# Patient Record
Sex: Male | Born: 1935
Health system: Southern US, Community
[De-identification: ages and names within clinical notes are randomized; demographics above are authoritative.]

## PROBLEM LIST (undated history)

## (undated) DIAGNOSIS — E785 Hyperlipidemia, unspecified: Secondary | ICD-10-CM

## (undated) DIAGNOSIS — N179 Acute kidney failure, unspecified: Secondary | ICD-10-CM

## (undated) DIAGNOSIS — R350 Frequency of micturition: Secondary | ICD-10-CM

## (undated) DIAGNOSIS — R55 Syncope and collapse: Secondary | ICD-10-CM

## (undated) DIAGNOSIS — R011 Cardiac murmur, unspecified: Secondary | ICD-10-CM

## (undated) DIAGNOSIS — K5792 Diverticulitis of intestine, part unspecified, without perforation or abscess without bleeding: Secondary | ICD-10-CM

## (undated) DIAGNOSIS — Z45018 Encounter for adjustment and management of other part of cardiac pacemaker: Secondary | ICD-10-CM

## (undated) DIAGNOSIS — C61 Malignant neoplasm of prostate: Secondary | ICD-10-CM

## (undated) DIAGNOSIS — G479 Sleep disorder, unspecified: Secondary | ICD-10-CM

## (undated) DIAGNOSIS — I951 Orthostatic hypotension: Secondary | ICD-10-CM

## (undated) DIAGNOSIS — I4892 Unspecified atrial flutter: Secondary | ICD-10-CM

## (undated) DIAGNOSIS — Z933 Colostomy status: Secondary | ICD-10-CM

## (undated) DIAGNOSIS — R351 Nocturia: Secondary | ICD-10-CM

## (undated) DIAGNOSIS — Z950 Presence of cardiac pacemaker: Secondary | ICD-10-CM

## (undated) DIAGNOSIS — I10 Essential (primary) hypertension: Secondary | ICD-10-CM

## (undated) DIAGNOSIS — I495 Sick sinus syndrome: Secondary | ICD-10-CM

## (undated) DIAGNOSIS — E079 Disorder of thyroid, unspecified: Secondary | ICD-10-CM

## (undated) HISTORY — DX: Orthostatic hypotension: I95.1

## (undated) HISTORY — PX: TONSILLECTOMY: SUR1361

## (undated) HISTORY — DX: Acute kidney failure, unspecified: N17.9

## (undated) HISTORY — DX: Syncope and collapse: R55

## (undated) HISTORY — DX: Encounter for adjustment and management of other part of cardiac pacemaker: Z45.018

## (undated) HISTORY — DX: Unspecified atrial flutter: I48.92

## (undated) HISTORY — DX: Diverticulitis of intestine, part unspecified, without perforation or abscess without bleeding: K57.92

## (undated) HISTORY — PX: RADIOACTIVE SEED IMPLANT: SHX5150

## (undated) HISTORY — DX: Cardiac murmur, unspecified: R01.1

---
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
## 2007-07-12 ENCOUNTER — Ambulatory Visit: Admission: RE | Admit: 2007-07-12 | Discharge: 2007-10-10 | Payer: Self-pay | Admitting: Radiation Oncology

## 2007-10-01 ENCOUNTER — Encounter: Admission: RE | Admit: 2007-10-01 | Discharge: 2007-10-01 | Payer: Self-pay | Admitting: Urology

## 2007-10-08 ENCOUNTER — Ambulatory Visit (HOSPITAL_BASED_OUTPATIENT_CLINIC_OR_DEPARTMENT_OTHER): Admission: RE | Admit: 2007-10-08 | Discharge: 2007-10-08 | Payer: Self-pay | Admitting: Urology

## 2007-10-29 ENCOUNTER — Ambulatory Visit: Admission: RE | Admit: 2007-10-29 | Discharge: 2008-01-25 | Payer: Self-pay | Admitting: Radiation Oncology

## 2008-12-25 ENCOUNTER — Ambulatory Visit (HOSPITAL_COMMUNITY): Admission: RE | Admit: 2008-12-25 | Discharge: 2008-12-25 | Payer: Self-pay | Admitting: Urology

## 2009-09-28 ENCOUNTER — Ambulatory Visit (HOSPITAL_COMMUNITY): Admission: RE | Admit: 2009-09-28 | Discharge: 2009-09-28 | Payer: Self-pay | Admitting: Urology

## 2010-05-27 ENCOUNTER — Encounter: Payer: Self-pay | Admitting: Urology

## 2010-09-13 ENCOUNTER — Other Ambulatory Visit (HOSPITAL_COMMUNITY): Payer: Self-pay | Admitting: Urology

## 2010-09-13 DIAGNOSIS — C61 Malignant neoplasm of prostate: Secondary | ICD-10-CM

## 2010-09-17 NOTE — Op Note (Signed)
[name] [name], [name] [name]                 ACCOUNT NO.:  [id number]   MEDICAL RECORD NO.:  [id number]          PATIENT TYPE:  AMB   LOCATION:  [facility]                         FACILITY:  [facility]   PHYSICIAN:  Hanley Ben, M.D.  DATE OF BIRTH:  [birth year]   DATE OF PROCEDURE:  10/08/2007  DATE OF DISCHARGE:                               OPERATIVE REPORT   PREOPERATIVE DIAGNOSIS:  Adenocarcinoma of prostate.   POSTOPERATIVE DIAGNOSIS:  Adenocarcinoma of prostate.   PROCEDURE:  I-125 seed implantation.   SURGEONS:  Arvil Persons, M.D., and Sheral Apley. Tammi Klippel, M.D.   ANESTHESIA:  General.   INDICATIONS:  The patient is 75 year old male who had a positive  prostate biopsy for adenocarcinoma of the prostate, Gleason score 6 and  7.  PSA was 8.3.  Treatment options were discussed with the patient and  he chose to have radiation treatment.  He was advised by Dr. Tammi Klippel to  have combination  external beam and brachytherapy.  He had already  received IMRT and he is now scheduled for I-125 seed implantation.   Under general anesthesia the patient was prepped and draped and placed  in the dorsal lithotomy position.  A Foley catheter was inserted in the  bladder.  A rectal tube was inserted in the rectum.  Ultrasound planning  was then done by Dr. Tammi Klippel.  When the ultrasound planning was  completed, using the Nucletron and under ultrasound guidance 24 needles  were used to insert 83 seeds in the prostate.  The total activity is  32.0380 mCi.  Then under fluoroscopy there appears to be good seed  distribution.   The Foley catheter that was inserted in the bladder was then removed.  A  flexible cystoscope was then passed in the bladder.  The anterior  urethra is normal.  There is moderate prostatic hypertrophy.  There are  some blood clots in the bladder.  The cystoscope was then removed.  A  #24 Foley catheter was then inserted in the bladder and the bladder was  irrigated with normal saline and  several small blood clots were  irrigated out.  The cystoscope was then reinserted in the bladder.  There is no free-floating seed in the bladder.  There is no stone or  tumor in the bladder.  The ureteral orifices are in normal position and  shape.  There appears to be one seed at the bladder neck; however, it  is under the mucosa and therefore it was not extracted.  The cystoscope  was then removed.  A #16 Foley catheter was then inserted in the  bladder.   The patient tolerated the procedure well and left the OR in satisfactory  condition to post anesthesia care unit.      Hanley Ben, M.D.  Electronically Signed     MN/MEDQ  D:  10/08/2007  T:  10/08/2007  Job:  [id number]   cc:   Sheral Apley Tammi Klippel, M.D.  Fax: [phone]

## 2010-09-25 ENCOUNTER — Encounter (HOSPITAL_COMMUNITY): Payer: Self-pay

## 2010-09-25 ENCOUNTER — Encounter (HOSPITAL_COMMUNITY): Payer: Medicare Other

## 2010-09-25 ENCOUNTER — Encounter (HOSPITAL_COMMUNITY)
Admission: RE | Admit: 2010-09-25 | Discharge: 2010-09-25 | Disposition: A | Discharge status: Home / Self Care | Payer: Medicare Other | Source: Ambulatory Visit | Attending: Urology | Admitting: Urology

## 2010-09-25 DIAGNOSIS — C61 Malignant neoplasm of prostate: Secondary | ICD-10-CM

## 2010-09-25 HISTORY — DX: Malignant neoplasm of prostate: C61

## 2010-09-25 MED ORDER — TECHNETIUM TC 99M MEDRONATE IV KIT
23.9000 | PACK | Freq: Once | INTRAVENOUS | Status: AC | PRN
  Administered 2010-09-25: 23.9 via INTRAVENOUS

## 2010-11-08 ENCOUNTER — Other Ambulatory Visit (HOSPITAL_COMMUNITY): Payer: Self-pay | Admitting: Urology

## 2010-11-08 DIAGNOSIS — R935 Abnormal findings on diagnostic imaging of other abdominal regions, including retroperitoneum: Secondary | ICD-10-CM

## 2010-11-11 ENCOUNTER — Ambulatory Visit (HOSPITAL_COMMUNITY): Payer: Medicare Other

## 2010-11-11 ENCOUNTER — Ambulatory Visit (HOSPITAL_COMMUNITY)
Admission: RE | Admit: 2010-11-11 | Discharge: 2010-11-11 | Disposition: A | Discharge status: Home / Self Care | Payer: Medicare Other | Source: Ambulatory Visit | Attending: Urology | Admitting: Urology

## 2010-11-11 DIAGNOSIS — R935 Abnormal findings on diagnostic imaging of other abdominal regions, including retroperitoneum: Secondary | ICD-10-CM

## 2010-11-13 ENCOUNTER — Ambulatory Visit (HOSPITAL_COMMUNITY): Admission: RE | Admit: 2010-11-13 | Payer: Medicare Other | Source: Ambulatory Visit

## 2010-11-13 ENCOUNTER — Ambulatory Visit (HOSPITAL_COMMUNITY)
Admission: RE | Admit: 2010-11-13 | Discharge: 2010-11-13 | Disposition: A | Discharge status: Home / Self Care | Payer: Medicare Other | Source: Ambulatory Visit | Attending: Urology | Admitting: Urology

## 2010-11-13 ENCOUNTER — Other Ambulatory Visit: Payer: Self-pay | Admitting: Interventional Radiology

## 2010-11-13 ENCOUNTER — Other Ambulatory Visit (HOSPITAL_COMMUNITY): Payer: Self-pay | Admitting: Urology

## 2010-11-13 DIAGNOSIS — R599 Enlarged lymph nodes, unspecified: Secondary | ICD-10-CM | POA: Insufficient documentation

## 2010-11-13 DIAGNOSIS — R935 Abnormal findings on diagnostic imaging of other abdominal regions, including retroperitoneum: Secondary | ICD-10-CM

## 2010-11-13 DIAGNOSIS — Z8546 Personal history of malignant neoplasm of prostate: Secondary | ICD-10-CM | POA: Insufficient documentation

## 2010-11-13 LAB — CBC
HCT: 37.1 % — ABNORMAL LOW (ref 39.0–52.0)
MCV: 90.7 fL (ref 78.0–100.0)
Platelets: 196 10*3/uL (ref 150–400)
RBC: 4.09 MIL/uL — ABNORMAL LOW (ref 4.22–5.81)
WBC: 5.4 10*3/uL (ref 4.0–10.5)

## 2010-11-13 LAB — PROTIME-INR: INR: 1.08 (ref 0.00–1.49)

## 2010-11-19 ENCOUNTER — Other Ambulatory Visit (HOSPITAL_COMMUNITY): Payer: Medicare Other

## 2011-01-29 LAB — CBC
Hemoglobin: 11.9 — ABNORMAL LOW
MCHC: 33.7
RBC: 3.77 — ABNORMAL LOW
RDW: 15
WBC: 4.5

## 2011-01-29 LAB — COMPREHENSIVE METABOLIC PANEL
AST: 18
Albumin: 3.7
Alkaline Phosphatase: 47
BUN: 24 — ABNORMAL HIGH
Chloride: 106
Glucose, Bld: 104 — ABNORMAL HIGH
Potassium: 4.9

## 2011-01-29 LAB — PROTIME-INR: Prothrombin Time: 14.3

## 2011-04-30 DIAGNOSIS — I10 Essential (primary) hypertension: Secondary | ICD-10-CM

## 2011-04-30 HISTORY — DX: Essential (primary) hypertension: I10

## 2011-05-08 DIAGNOSIS — R9389 Abnormal findings on diagnostic imaging of other specified body structures: Secondary | ICD-10-CM | POA: Diagnosis not present

## 2011-05-08 DIAGNOSIS — I10 Essential (primary) hypertension: Secondary | ICD-10-CM | POA: Diagnosis not present

## 2011-05-08 DIAGNOSIS — E785 Hyperlipidemia, unspecified: Secondary | ICD-10-CM | POA: Diagnosis not present

## 2011-05-14 DIAGNOSIS — I4892 Unspecified atrial flutter: Secondary | ICD-10-CM | POA: Diagnosis not present

## 2011-05-14 DIAGNOSIS — R079 Chest pain, unspecified: Secondary | ICD-10-CM | POA: Diagnosis not present

## 2011-05-16 DIAGNOSIS — R079 Chest pain, unspecified: Secondary | ICD-10-CM | POA: Diagnosis not present

## 2011-05-16 DIAGNOSIS — I4892 Unspecified atrial flutter: Secondary | ICD-10-CM | POA: Diagnosis not present

## 2011-05-26 DIAGNOSIS — I4892 Unspecified atrial flutter: Secondary | ICD-10-CM | POA: Diagnosis not present

## 2011-05-26 DIAGNOSIS — I10 Essential (primary) hypertension: Secondary | ICD-10-CM | POA: Diagnosis not present

## 2011-05-26 DIAGNOSIS — E7800 Pure hypercholesterolemia, unspecified: Secondary | ICD-10-CM | POA: Diagnosis not present

## 2011-05-26 DIAGNOSIS — R079 Chest pain, unspecified: Secondary | ICD-10-CM | POA: Diagnosis not present

## 2011-06-02 DIAGNOSIS — I4891 Unspecified atrial fibrillation: Secondary | ICD-10-CM | POA: Diagnosis not present

## 2011-06-02 DIAGNOSIS — F17200 Nicotine dependence, unspecified, uncomplicated: Secondary | ICD-10-CM | POA: Diagnosis not present

## 2011-06-02 DIAGNOSIS — I10 Essential (primary) hypertension: Secondary | ICD-10-CM | POA: Diagnosis not present

## 2011-06-02 DIAGNOSIS — Z131 Encounter for screening for diabetes mellitus: Secondary | ICD-10-CM | POA: Diagnosis not present

## 2011-06-05 DIAGNOSIS — E7800 Pure hypercholesterolemia, unspecified: Secondary | ICD-10-CM | POA: Diagnosis not present

## 2011-06-05 DIAGNOSIS — Z7901 Long term (current) use of anticoagulants: Secondary | ICD-10-CM | POA: Diagnosis not present

## 2011-06-05 DIAGNOSIS — I4892 Unspecified atrial flutter: Secondary | ICD-10-CM | POA: Diagnosis not present

## 2011-06-05 DIAGNOSIS — I10 Essential (primary) hypertension: Secondary | ICD-10-CM | POA: Diagnosis not present

## 2011-06-06 ENCOUNTER — Other Ambulatory Visit (HOSPITAL_COMMUNITY): Payer: Self-pay | Admitting: Cardiology

## 2011-06-06 DIAGNOSIS — I4892 Unspecified atrial flutter: Secondary | ICD-10-CM

## 2011-06-09 ENCOUNTER — Encounter (HOSPITAL_COMMUNITY): Payer: Self-pay | Admitting: Respiratory Therapy

## 2011-06-16 ENCOUNTER — Other Ambulatory Visit: Payer: Self-pay

## 2011-06-16 ENCOUNTER — Ambulatory Visit (HOSPITAL_COMMUNITY): Payer: Medicare Other | Admitting: Anesthesiology

## 2011-06-16 ENCOUNTER — Encounter (HOSPITAL_COMMUNITY)
Admission: RE | Disposition: A | Discharge status: Home / Self Care | Payer: Self-pay | Source: Ambulatory Visit | Attending: Cardiology

## 2011-06-16 ENCOUNTER — Encounter (HOSPITAL_COMMUNITY): Payer: Self-pay | Admitting: Anesthesiology

## 2011-06-16 ENCOUNTER — Ambulatory Visit (HOSPITAL_COMMUNITY)
Admission: RE | Admit: 2011-06-16 | Discharge: 2011-06-16 | Disposition: A | Discharge status: Home / Self Care | Payer: Medicare Other | Source: Ambulatory Visit | Attending: Cardiology | Admitting: Cardiology

## 2011-06-16 DIAGNOSIS — I10 Essential (primary) hypertension: Secondary | ICD-10-CM

## 2011-06-16 DIAGNOSIS — I4892 Unspecified atrial flutter: Secondary | ICD-10-CM | POA: Diagnosis not present

## 2011-06-16 DIAGNOSIS — E785 Hyperlipidemia, unspecified: Secondary | ICD-10-CM | POA: Insufficient documentation

## 2011-06-16 HISTORY — DX: Unspecified atrial flutter: I48.92

## 2011-06-16 HISTORY — PX: CARDIOVERSION: SHX1299

## 2011-06-16 LAB — BASIC METABOLIC PANEL
BUN: 14 mg/dL (ref 6–23)
Calcium: 8.8 mg/dL (ref 8.4–10.5)
Creatinine, Ser: 1.03 mg/dL (ref 0.50–1.35)
GFR calc Af Amer: 81 mL/min — ABNORMAL LOW (ref >90)

## 2011-06-16 LAB — CBC
HCT: 35.9 % — ABNORMAL LOW (ref 39.0–52.0)
MCH: 30.9 pg (ref 26.0–34.0)
MCHC: 33.7 g/dL (ref 30.0–36.0)
MCV: 91.8 fL (ref 78.0–100.0)
Platelets: 295 10*3/uL (ref 150–400)
RDW: 14 % (ref 11.5–15.5)

## 2011-06-16 LAB — PROTIME-INR: Prothrombin Time: 17.2 seconds — ABNORMAL HIGH (ref 11.6–15.2)

## 2011-06-16 SURGERY — CARDIOVERSION
Anesthesia: General | Wound class: Clean

## 2011-06-16 MED ORDER — HYDROCORTISONE 1 % EX CREA
1.0000 "application " | TOPICAL_CREAM | Freq: Three times a day (TID) | CUTANEOUS | Status: DC | PRN
  Filled 2011-06-16: qty 28

## 2011-06-16 MED ORDER — PROPOFOL 10 MG/ML IV EMUL
INTRAVENOUS | Status: DC | PRN
  Administered 2011-06-16: 100 mg via INTRAVENOUS

## 2011-06-16 MED ORDER — SODIUM CHLORIDE 0.9 % IJ SOLN: 3.0000 mL | Freq: Two times a day (BID) | INTRAMUSCULAR | Status: DC

## 2011-06-16 MED ORDER — LIDOCAINE HCL (CARDIAC) 20 MG/ML IV SOLN
INTRAVENOUS | Status: DC | PRN
  Administered 2011-06-16: 60 mg via INTRAVENOUS

## 2011-06-16 MED ORDER — SODIUM CHLORIDE 0.9 % IV SOLN
INTRAVENOUS | Status: DC | PRN
  Administered 2011-06-16: 09:00:00 via INTRAVENOUS

## 2011-06-16 MED ORDER — SODIUM CHLORIDE 0.9 % IV SOLN: 250.0000 mL | INTRAVENOUS | Status: DC

## 2011-06-16 MED ORDER — SODIUM CHLORIDE 0.9 % IJ SOLN: 3.0000 mL | INTRAMUSCULAR | Status: DC | PRN

## 2011-06-16 NOTE — Preoperative (Signed)
Beta Blockers   Reason not to administer Beta Blockers:Not Applicable 

## 2011-06-16 NOTE — H&P (Signed)
CC; Scheduled for elective DCCV for A. Flutter  HOPI: Patient presents for f/u of atrial flutter.   Initially presented for evaluation of abnormal EKG and chest pain.   Patient states that since last office visit he has felt significant improvement in overall well-being.   He has not had any chest pain.   He denies any shortness of breath.  Chest pain started 6-12 months  ago.   It is described as throbbing.     Also c/o syncope.   Symptoms started  12 months ago.   There has been 2 recurrent episodes.   last episode was 3 month ago.   Symptom onset was associated with lightheadedness and dizziness prior to the syncope.   He attributes his symptoms to Marijuana use which he has now quit.    ROS: Arthritis-no, life-style limiting-no;  Cramping of the legs and feet at night or with activity-no; Diabetes-no;  Hypothyroidism-h/o Hyperthyroidism 30 years ago;  Previous Stroke-no, Previous GI Bleed-no ,  Recent weight change-has gained 20 lbs.   in the last 3-4 months.   Other systems negative.     Known allergies: NKDA.   Physical Exam: Afebrile. HR 69/min, irreg, 160/75 mm Hg  GENERAL: Well built and normal body habitus,  appears younger than stated age, no acute distress.   Alert Ox3.   EYES: PERRLA, EOM's full, conjunctivae clear.   THROAT: Clear, no exudates, no lesions.   NECK: Supple, no masses, no thyromegaly, no JVD.     CHEST: Lungs clear, no rales, no rhonchi, no wheezes.     HEART: S1S2 normal,  no murmurs, no rubs, no gallops.     ABDOMEN: Soft, no tenderness, no masses, BS normal.     BACK: Normal curvature, no tenderness.   EXTREMITIES: Full range of motion, no deformities, no edema, no erythema.   NEURO: Alert, oriented x 3,  no localizing findings.     SKIN: Normal, no rashes, no lesions noted.   No ulcerations or signs of limb ischemia.   No xanthelasma or Xanthoma.     Vascular exam: Normal carotid upstroke.   No bruit.   Abdominal aortic palpation appears to be normal without  prominent pulsation.   No bruit.   Femoral and pedal pulses are normal.  Impression: 1. Atrial Flutter with controlled ventricular response.  ECG 06/16/11: A. Flutter with VR 75/min with 2:1 conduction. Non specific ST change. Normal QT. ECG 05/26/11: A.   Flutter with controlled ventricular response @ 55-60/min.   Variable AV conduction.    Exercise sestamibi.   05/16/11: Ex: 5 min, 5.3 METs.   EKG: A.   Flutter.   Normal myocaridal perfusion.   EF 79%.  Labs done on 07-2010 revealed CBC, CMP, TSH normal at 0.840.     2. Hypertension.  Controlled.  3. Hyperlipidemia.    4. Chest pain  atypical.   No further pain since last OV 04/30/11. Exercise sestamibi. 05/16/11: Ex: 5 min, 5.3 METs. EKG: A. Flutter. Normal myocaridal perfusion. EF 79% Echo 05/14/11: Normal LVEF, Mod Left, Mild Right Atrial enlargement. Mild RV dil. Mild Pul HTN. MildTR.  Plan: Mechanism of underlying disease process and action of medications discussed with the patient.   BP more stable since stopping Benicar HCT  from 80/58 mm Hg to today 138/74 mm Hg. I will continue Cardizem CD 180 mg by mouth twice a day, and  Flecainide 50 mg po BID. Patient presents  for direct-current cardioversion as an outpatient basis.  I have discussed regarding risks benefits rate control vs rhythm control with the patient. Patient understands cardiac arrest and need for CPR, aspiration pneumonia, but not limited to these. Patient is willing.

## 2011-06-16 NOTE — Anesthesia Preprocedure Evaluation (Addendum)
Anesthesia Evaluation   Patient awake    Reviewed: Allergy & Precautions, H&P , NPO status , Patient's Chart, lab work & pertinent test results  Airway Mallampati: I  Neck ROM: Full    Dental   Pulmonary  clear to auscultation        Cardiovascular hypertension, Pt. on medications + dysrhythmias Atrial Fibrillation Irregular Normal    Neuro/Psych    GI/Hepatic   Endo/Other    Renal/GU      Musculoskeletal   Abdominal   Peds  Hematology   Anesthesia Other Findings   Reproductive/Obstetrics                          Anesthesia Physical Anesthesia Plan  ASA: III  Anesthesia Plan: General   Post-op Pain Management:    Induction: Intravenous  Airway Management Planned: Mask  Additional Equipment:   Intra-op Plan:   Post-operative Plan:   Informed Consent: I have reviewed the patients History and Physical, chart, labs and discussed the procedure including the risks, benefits and alternatives for the proposed anesthesia with the patient or authorized representative who has indicated his/her understanding and acceptance.   Dental advisory given  Plan Discussed with: Anesthesiologist, Surgeon and CRNA  Anesthesia Plan Comments:        Anesthesia Quick Evaluation

## 2011-06-16 NOTE — Op Note (Signed)
Direct current cardioversion: Indication symptomatic A. Flutter.  Procedure: Using 100 mg of IV Propofol for achieving deep (Moderate sedation), synchronized direct current cardioversion performed. Patient was delivered with 50 Joules of electricity with success to NSR. Patient tolerated the procedure well. No immediate complication noted.

## 2011-06-16 NOTE — Transfer of Care (Signed)
Immediate Anesthesia Transfer of Care Note  Patient: [name] [name]  Procedure(s) Performed:  CARDIOVERSION  Patient Location: Short Stay  Anesthesia Type: General  Level of Consciousness: awake, alert  and oriented  Airway & Oxygen Therapy: Patient Spontanous Breathing and Patient connected to nasal cannula oxygen  Post-op Assessment: Report given to PACU RN and Post -op Vital signs reviewed and stable  Post vital signs: Reviewed and stable  Complications: No apparent anesthesia complications

## 2011-06-16 NOTE — Anesthesia Postprocedure Evaluation (Signed)
  Anesthesia Post-op Note  Patient: [name] [name]  Procedure(s) Performed:  CARDIOVERSION  Patient Location: Short Stay  Anesthesia Type: General  Level of Consciousness: awake, alert  and oriented  Airway and Oxygen Therapy: Patient Spontanous Breathing and Patient connected to nasal cannula oxygen  Post-op Pain: none  Post-op Assessment: Post-op Vital signs reviewed, Patient's Cardiovascular Status Stable, Respiratory Function Stable and No signs of Nausea or vomiting  Post-op Vital Signs: Reviewed and stable  Complications: No apparent anesthesia complications

## 2011-06-17 ENCOUNTER — Encounter (HOSPITAL_COMMUNITY): Payer: Self-pay | Admitting: Cardiology

## 2011-06-18 DIAGNOSIS — E7800 Pure hypercholesterolemia, unspecified: Secondary | ICD-10-CM | POA: Diagnosis not present

## 2011-06-18 DIAGNOSIS — I4892 Unspecified atrial flutter: Secondary | ICD-10-CM | POA: Diagnosis not present

## 2011-06-18 DIAGNOSIS — Z7901 Long term (current) use of anticoagulants: Secondary | ICD-10-CM | POA: Diagnosis not present

## 2011-06-18 DIAGNOSIS — I10 Essential (primary) hypertension: Secondary | ICD-10-CM | POA: Diagnosis not present

## 2011-06-27 DIAGNOSIS — C61 Malignant neoplasm of prostate: Secondary | ICD-10-CM | POA: Diagnosis not present

## 2011-07-03 DIAGNOSIS — C61 Malignant neoplasm of prostate: Secondary | ICD-10-CM | POA: Diagnosis not present

## 2011-09-01 DIAGNOSIS — E785 Hyperlipidemia, unspecified: Secondary | ICD-10-CM | POA: Diagnosis not present

## 2011-09-01 DIAGNOSIS — I4891 Unspecified atrial fibrillation: Secondary | ICD-10-CM | POA: Diagnosis not present

## 2011-09-01 DIAGNOSIS — C61 Malignant neoplasm of prostate: Secondary | ICD-10-CM | POA: Diagnosis not present

## 2011-09-01 DIAGNOSIS — I10 Essential (primary) hypertension: Secondary | ICD-10-CM | POA: Diagnosis not present

## 2011-11-03 DIAGNOSIS — Z950 Presence of cardiac pacemaker: Secondary | ICD-10-CM

## 2011-11-03 HISTORY — DX: Presence of cardiac pacemaker: Z95.0

## 2011-11-03 HISTORY — PX: CARDIAC ELECTROPHYSIOLOGY STUDY AND ABLATION: SHX1294

## 2011-11-20 ENCOUNTER — Emergency Department (HOSPITAL_COMMUNITY): Payer: Medicare Other

## 2011-11-20 ENCOUNTER — Encounter (HOSPITAL_COMMUNITY): Payer: Self-pay | Admitting: Emergency Medicine

## 2011-11-20 ENCOUNTER — Inpatient Hospital Stay (HOSPITAL_COMMUNITY)
Admission: EM | Admit: 2011-11-20 | Discharge: 2011-11-24 | DRG: 250 | Disposition: A | Discharge status: Home / Self Care | Payer: Medicare Other | Attending: Cardiology | Admitting: Cardiology

## 2011-11-20 DIAGNOSIS — Z87891 Personal history of nicotine dependence: Secondary | ICD-10-CM | POA: Diagnosis not present

## 2011-11-20 DIAGNOSIS — E785 Hyperlipidemia, unspecified: Secondary | ICD-10-CM | POA: Diagnosis present

## 2011-11-20 DIAGNOSIS — R0602 Shortness of breath: Secondary | ICD-10-CM | POA: Diagnosis not present

## 2011-11-20 DIAGNOSIS — I495 Sick sinus syndrome: Secondary | ICD-10-CM | POA: Diagnosis present

## 2011-11-20 DIAGNOSIS — I10 Essential (primary) hypertension: Secondary | ICD-10-CM | POA: Diagnosis present

## 2011-11-20 DIAGNOSIS — S2239XA Fracture of one rib, unspecified side, initial encounter for closed fracture: Secondary | ICD-10-CM | POA: Diagnosis not present

## 2011-11-20 DIAGNOSIS — R55 Syncope and collapse: Secondary | ICD-10-CM | POA: Diagnosis present

## 2011-11-20 DIAGNOSIS — Z79899 Other long term (current) drug therapy: Secondary | ICD-10-CM

## 2011-11-20 DIAGNOSIS — R7309 Other abnormal glucose: Secondary | ICD-10-CM | POA: Diagnosis present

## 2011-11-20 DIAGNOSIS — R42 Dizziness and giddiness: Secondary | ICD-10-CM | POA: Diagnosis not present

## 2011-11-20 DIAGNOSIS — I4892 Unspecified atrial flutter: Secondary | ICD-10-CM | POA: Diagnosis not present

## 2011-11-20 DIAGNOSIS — R61 Generalized hyperhidrosis: Secondary | ICD-10-CM | POA: Diagnosis not present

## 2011-11-20 DIAGNOSIS — R001 Bradycardia, unspecified: Secondary | ICD-10-CM

## 2011-11-20 DIAGNOSIS — I469 Cardiac arrest, cause unspecified: Secondary | ICD-10-CM | POA: Diagnosis present

## 2011-11-20 DIAGNOSIS — W1809XA Striking against other object with subsequent fall, initial encounter: Secondary | ICD-10-CM | POA: Diagnosis present

## 2011-11-20 DIAGNOSIS — C61 Malignant neoplasm of prostate: Secondary | ICD-10-CM | POA: Diagnosis present

## 2011-11-20 DIAGNOSIS — I498 Other specified cardiac arrhythmias: Secondary | ICD-10-CM | POA: Diagnosis not present

## 2011-11-20 DIAGNOSIS — M549 Dorsalgia, unspecified: Secondary | ICD-10-CM | POA: Diagnosis not present

## 2011-11-20 DIAGNOSIS — R404 Transient alteration of awareness: Secondary | ICD-10-CM | POA: Diagnosis not present

## 2011-11-20 DIAGNOSIS — R079 Chest pain, unspecified: Secondary | ICD-10-CM | POA: Diagnosis not present

## 2011-11-20 HISTORY — DX: Unspecified atrial flutter: I48.92

## 2011-11-20 HISTORY — DX: Essential (primary) hypertension: I10

## 2011-11-20 HISTORY — DX: Syncope and collapse: R55

## 2011-11-20 LAB — COMPREHENSIVE METABOLIC PANEL
ALT: 16 U/L (ref 0–53)
AST: 24 U/L (ref 0–37)
CO2: 24 mEq/L (ref 19–32)
Calcium: 9.4 mg/dL (ref 8.4–10.5)
Creatinine, Ser: 1.59 mg/dL — ABNORMAL HIGH (ref 0.50–1.35)
GFR calc Af Amer: 48 mL/min — ABNORMAL LOW (ref >90)
GFR calc non Af Amer: 41 mL/min — ABNORMAL LOW (ref >90)
Glucose, Bld: 172 mg/dL — ABNORMAL HIGH (ref 70–99)
Sodium: 145 mEq/L (ref 135–145)
Total Protein: 6.9 g/dL (ref 6.0–8.3)

## 2011-11-20 LAB — PROTIME-INR
INR: 1.09 (ref 0.00–1.49)
Prothrombin Time: 14.3 seconds (ref 11.6–15.2)

## 2011-11-20 LAB — CBC
MCH: 30.9 pg (ref 26.0–34.0)
MCHC: 34 g/dL (ref 30.0–36.0)
MCV: 91 fL (ref 78.0–100.0)
Platelets: 190 10*3/uL (ref 150–400)

## 2011-11-20 LAB — CK TOTAL AND CKMB (NOT AT ARMC): Relative Index: 1.9 (ref 0.0–2.5)

## 2011-11-20 LAB — TROPONIN I: Troponin I: <0.3 ng/mL (ref <0.30)

## 2011-11-20 MED ORDER — TAMSULOSIN HCL 0.4 MG PO CAPS
0.4000 mg | ORAL_CAPSULE | Freq: Two times a day (BID) | ORAL | Status: DC
  Administered 2011-11-20 – 2011-11-24 (×9): 0.4 mg via ORAL
  Filled 2011-11-20 (×10): qty 1

## 2011-11-20 MED ORDER — SIMVASTATIN 20 MG PO TABS
20.0000 mg | ORAL_TABLET | Freq: Every day | ORAL | Status: DC
  Administered 2011-11-20 – 2011-11-22 (×3): 20 mg via ORAL
  Filled 2011-11-20 (×5): qty 1

## 2011-11-20 MED ORDER — HYDROCODONE-ACETAMINOPHEN 5-325 MG PO TABS
1.0000 | ORAL_TABLET | ORAL | Status: DC | PRN
  Administered 2011-11-20: 2 via ORAL
  Administered 2011-11-20: 1 via ORAL
  Filled 2011-11-20: qty 2

## 2011-11-20 MED ORDER — SODIUM CHLORIDE 0.9 % IV SOLN
INTRAVENOUS | Status: AC
  Administered 2011-11-20: 75 mL/h via INTRAVENOUS

## 2011-11-20 MED ORDER — OLMESARTAN MEDOXOMIL-HCTZ 40-12.5 MG PO TABS: 1.0000 | ORAL_TABLET | Freq: Every day | ORAL | Status: DC

## 2011-11-20 MED ORDER — ASPIRIN EC 81 MG PO TBEC
81.0000 mg | DELAYED_RELEASE_TABLET | Freq: Every day | ORAL | Status: DC
  Administered 2011-11-20 – 2011-11-21 (×2): 81 mg via ORAL
  Filled 2011-11-20 (×2): qty 1

## 2011-11-20 MED ORDER — SODIUM CHLORIDE 0.9 % IV BOLUS (SEPSIS)
1000.0000 mL | Freq: Once | INTRAVENOUS | Status: AC
  Administered 2011-11-20: 1000 mL via INTRAVENOUS

## 2011-11-20 MED ORDER — ACETAMINOPHEN 650 MG RE SUPP: 650.0000 mg | Freq: Four times a day (QID) | RECTAL | Status: DC | PRN

## 2011-11-20 MED ORDER — ATROPINE SULFATE 1 MG/ML IJ SOLN: 0.4000 mg | Freq: Once | INTRAMUSCULAR | Status: DC

## 2011-11-20 MED ORDER — ACETAMINOPHEN 325 MG PO TABS: 650.0000 mg | ORAL_TABLET | Freq: Four times a day (QID) | ORAL | Status: DC | PRN

## 2011-11-20 MED ORDER — SODIUM CHLORIDE 0.9 % IJ SOLN: 3.0000 mL | INTRAMUSCULAR | Status: DC | PRN

## 2011-11-20 MED ORDER — ONDANSETRON HCL 4 MG/2ML IJ SOLN
4.0000 mg | Freq: Once | INTRAMUSCULAR | Status: AC
  Administered 2011-11-20: 4 mg via INTRAVENOUS
  Filled 2011-11-20: qty 2

## 2011-11-20 MED ORDER — ATROPINE SULFATE 1 MG/ML IJ SOLN
0.5000 mg | Freq: Once | INTRAMUSCULAR | Status: AC
  Administered 2011-11-20: 0.5 mg via INTRAVENOUS
  Filled 2011-11-20: qty 1

## 2011-11-20 MED ORDER — SODIUM CHLORIDE 0.9 % IJ SOLN
3.0000 mL | Freq: Two times a day (BID) | INTRAMUSCULAR | Status: DC
  Administered 2011-11-21: 3 mL via INTRAVENOUS

## 2011-11-20 MED ORDER — MORPHINE SULFATE 2 MG/ML IJ SOLN
2.0000 mg | Freq: Once | INTRAMUSCULAR | Status: AC
  Administered 2011-11-20: 2 mg via INTRAVENOUS
  Filled 2011-11-20: qty 1

## 2011-11-20 MED ORDER — SODIUM CHLORIDE 0.9 % IV SOLN
INTRAVENOUS | Status: DC
  Administered 2011-11-21: 03:00:00 via INTRAVENOUS

## 2011-11-20 MED ORDER — HYDROCHLOROTHIAZIDE 12.5 MG PO CAPS
12.5000 mg | ORAL_CAPSULE | Freq: Every day | ORAL | Status: DC
  Administered 2011-11-20 – 2011-11-24 (×5): 12.5 mg via ORAL
  Filled 2011-11-20 (×5): qty 1

## 2011-11-20 MED ORDER — SODIUM CHLORIDE 0.9 % IV SOLN: 250.0000 mL | INTRAVENOUS | Status: DC | PRN

## 2011-11-20 MED ORDER — IRBESARTAN 300 MG PO TABS
300.0000 mg | ORAL_TABLET | Freq: Every day | ORAL | Status: DC
  Administered 2011-11-20 – 2011-11-24 (×5): 300 mg via ORAL
  Filled 2011-11-20 (×5): qty 1

## 2011-11-20 MED ORDER — ATROPINE SULFATE 0.1 MG/ML IJ SOLN
0.5000 mg | Freq: Once | INTRAMUSCULAR | Status: AC
  Administered 2011-11-20: 0.5 mg via INTRAVENOUS

## 2011-11-20 NOTE — Progress Notes (Signed)
We have discussed treatment options   The patient would like to proceed with catheter ablation   We have reviewed risks incl but not limited to death cva and heart block and vascular injury We also discussed that 50% of people presenting with symptomatic bradyarrhtyhmtia will persist with the problem following discontinuation of the meds and will need pacing, and theat 50% of the remainder, despite initial improvement will demonstrate subsequent need for pacing over the next year or so  Thus 1) [facility] AFlutter tomorrow 2) observe in house 48-72 hrs to make sure bradyarrhythmia resolve  Virl Axe, MD 11/20/2011 5:28 PM

## 2011-11-20 NOTE — ED Provider Notes (Addendum)
History     CSN: [id number]  Arrival date & time 11/20/11  0417   First MD Initiated Contact with Patient 11/20/11 0424      Chief Complaint  Patient presents with  . Dizziness    (Consider location/radiation/quality/duration/timing/severity/associated sxs/prior treatment) Patient is a 76 y.o. male presenting with syncope. The history is provided by the patient.  Loss of Consciousness This is a new (pt sattes had syncopal episode whiile urinating.  On several cardiac meds for atrial fibrillation.  Also complains of throbbing, nonradiating left side pain where he fell) problem. The current episode started less than 1 hour ago. The problem has not changed since onset.Pertinent negatives include no chest pain and no abdominal pain. The symptoms are aggravated by walking. Nothing relieves the symptoms. He has tried nothing for the symptoms.    Past Medical History  Diagnosis Date  . Prostate cancer   . A-fib   . Hypertension   . Hyperlipemia     Past Surgical History  Procedure Date  . Cardioversion 06/16/2011    Procedure: CARDIOVERSION;  Surgeon: Laverda Page, MD;  Location: [facility];  Service: Cardiovascular;  Laterality: N/A;    No family history on file.  History  Substance Use Topics  . Smoking status: Never Smoker   . Smokeless tobacco: Not on file  . Alcohol Use: No      Review of Systems  Cardiovascular: Positive for syncope. Negative for chest pain.  Gastrointestinal: Negative for abdominal pain.  Neurological: Positive for syncope.  All other systems reviewed and are negative.    Allergies  Review of patient's allergies indicates no known allergies.  Home Medications   Current Outpatient Rx  Name Route Sig Dispense Refill  . DABIGATRAN ETEXILATE MESYLATE 150 MG PO CAPS Oral Take 150 mg by mouth every 12 (twelve) hours.    Marland Kitchen DILTIAZEM HCL ER COATED BEADS 180 MG PO CP24 Oral Take 180 mg by mouth daily.    Marland Kitchen FLECAINIDE ACETATE 50 MG PO TABS Oral Take  50 mg by mouth 2 (two) times daily.    Marland Kitchen TAMSULOSIN HCL 0.4 MG PO CAPS Oral Take 0.4 mg by mouth daily after supper.      There were no vitals taken for this visit.  Physical Exam  Constitutional: He is oriented to person, place, and time. He appears well-developed and well-nourished.  HENT:  Head: Normocephalic and atraumatic.  Eyes: Conjunctivae are normal. Pupils are equal, round, and reactive to light.  Neck: Normal range of motion. Neck supple.  Cardiovascular: Regular rhythm, normal heart sounds and intact distal pulses.  Bradycardia present.   Pulmonary/Chest: Effort normal and breath sounds normal.  Abdominal: Soft. Bowel sounds are normal.  Musculoskeletal:       Arms: Neurological: He is alert and oriented to person, place, and time.  Skin: Skin is warm and dry.  Psychiatric: He has a normal mood and affect. His behavior is normal. Judgment and thought content normal.    ED Course  Procedures (including critical care time)   Labs Reviewed  CBC  COMPREHENSIVE METABOLIC PANEL  APTT  PROTIME-INR  TROPONIN I  CK TOTAL AND CKMB   No results found.   No diagnosis found.   Date: 11/20/2011  Rate: 35  Rhythm: junctional  QRS Axis: normal  Intervals: no appreciable pr interval  ST/T Wave abnormalities: nonspecific changes  Conduction Disutrbances:junctional rhythm  Narrative Interpretation: junctional rhythm  Old EKG Reviewed: changes noted    MDM  Pt with  hx of aflutter on flecainide,  Cardizem, pradaxa,  Comes to ed after syncopal episode.  On pacer.  Ct to rule out intracranial/abdominal injury,  Trial atropine, maintaining bp,  Will reassess   + 11th rib fracture.  No hemorrage/hematoma.  Mild improvement with atropine.  Await remainder of labs   Discussed with ganji.  Will admit   Aaron Edelman, MD 11/20/11 [place], MD 11/20/11 [place], MD 11/20/11 [phone]

## 2011-11-20 NOTE — Care Management Note (Signed)
    Page 1 of 1   11/20/2011     12:16:12 PM   CARE MANAGEMENT NOTE 11/20/2011  Patient:  [name] [name], [name] [name]   Account Number:  [id number]  Date Initiated:  11/20/2011  Documentation initiated by:  Elissa Hefty  Subjective/Objective Assessment:   adm w syncope     Action/Plan:   lives alone, pcp dr [name]   Anticipated DC Date:     Anticipated DC Plan:        [place]  CM consult      Choice offered to / List presented to:             Status of service:   Medicare Important Message given?   (If response is "NO", the following Medicare IM given date fields will be blank) Date Medicare IM given:   Date Additional Medicare IM given:    Discharge Disposition:    Per UR Regulation:  Reviewed for med. necessity/level of care/duration of stay  If discussed at [place] of Stay Meetings, dates discussed:    Comments:  7/18 12:15p debbie Ryver [place] rn,bsn [id number]

## 2011-11-20 NOTE — H&P (Signed)
[name] [name] is an 76 y.o. male.    Chief Complaint: Syncope  PCP: [name], MD  HPI: jareb talk is a very pleasant 76 year old African American male with history of prostate cancer which is stable, hypertension who has had an episode of atrial flutter. I had seen him first time in January of 2013.  I had treated him with flecainide and also Cardizem for his atrial flutter and hypertension. He has had a negative nuclear stress test in January of 2013. He underwent successful cardioversion of atrial flutter to sinus rhythm on 02/111/2013. He been doing well since then. Last night he went to visit his daughter and had an episode of syncope which was sudden in onset without any premonitory symptoms and hit his left side of his chest. He laid down on the sofa for lower but due to worsening left-sided chest discomfort EMS was activated and he was brought to the emergency department. In the ED he was found to have fractured left ribs and also was found to have frequent episodes of ventricular standstill. I was called to evaluate the patient. Presently patient states that he's doing well. No recurrence of syncope since one episode earlier last night around 11:30 PM. He denies any shortness of breath, PND or orthopnea. He denies any hemoptysis. Except for pain in the left side of his chest especially when he takes a deep breath he has no other complaints.   Past Medical History  Diagnosis Date  . Prostate cancer   . Atrial flutter   . Hypertension 04/30/11    Cardioversion 06/16/11  . Hyperlipemia     Past Surgical History  Procedure Date  . Cardioversion 06/16/2011    Procedure: CARDIOVERSION;  Surgeon: Laverda Page, MD;  Location: [facility];  Service: Cardiovascular;  Laterality: N/A;    History reviewed. No pertinent family history. Social History:  reports that he has never smoked. He does not have any smokeless tobacco history on file. He reports that he does not drink alcohol or use  illicit drugs.  Allergies: No Known Allergies   (Not in a hospital admission)  Results for orders placed during the hospital encounter of 11/20/11 (from the past 48 hour(s))  CBC     Status: Normal   Collection Time   11/20/11  4:30 AM      Component Value Range Comment   WBC 7.6  4.0 - 10.5 K/uL    RBC 4.33  4.22 - 5.81 MIL/uL    Hemoglobin 13.4  13.0 - 17.0 g/dL    HCT 39.4  39.0 - 52.0 %    MCV 91.0  78.0 - 100.0 fL    MCH 30.9  26.0 - 34.0 pg    MCHC 34.0  30.0 - 36.0 g/dL    RDW 14.4  11.5 - 15.5 %    Platelets 190  150 - 400 K/uL   COMPREHENSIVE METABOLIC PANEL     Status: Abnormal   Collection Time   11/20/11  4:30 AM      Component Value Range Comment   Sodium 145  135 - 145 mEq/L    Potassium 4.2  3.5 - 5.1 mEq/L    Chloride 107  96 - 112 mEq/L    CO2 24  19 - 32 mEq/L    Glucose, Bld 172 (*) 70 - 99 mg/dL    BUN 25 (*) 6 - 23 mg/dL    Creatinine, Ser 1.59 (*) 0.50 - 1.35 mg/dL    Calcium 9.4  8.4 - 10.5 mg/dL    Total Protein 6.9  6.0 - 8.3 g/dL    Albumin 3.8  3.5 - 5.2 g/dL    AST 24  0 - 37 U/L    ALT 16  0 - 53 U/L    Alkaline Phosphatase 67  39 - 117 U/L    Total Bilirubin 0.3  0.3 - 1.2 mg/dL    GFR calc non Af Amer 41 (*) >90 mL/min    GFR calc Af Amer 48 (*) >90 mL/min   APTT     Status: Normal   Collection Time   11/20/11  4:30 AM      Component Value Range Comment   aPTT 29  24 - 37 seconds   PROTIME-INR     Status: Normal   Collection Time   11/20/11  4:30 AM      Component Value Range Comment   Prothrombin Time 14.3  11.6 - 15.2 seconds    INR 1.09  0.00 - 1.49   TROPONIN I     Status: Normal   Collection Time   11/20/11  4:30 AM      Component Value Range Comment   Troponin I <0.30  <0.30 ng/mL   CK TOTAL AND CKMB     Status: Normal   Collection Time   11/20/11  4:30 AM      Component Value Range Comment   Total CK 170  7 - 232 U/L    CK, MB 3.3  0.3 - 4.0 ng/mL    Relative Index 1.9  0.0 - 2.5    Ct Abdomen Pelvis Wo  Contrast  11/20/2011  *RADIOLOGY REPORT*  Clinical Data: Left side and back pain after fall.  CT ABDOMEN AND PELVIS WITHOUT CONTRAST  Technique:  Multidetector CT imaging of the abdomen and pelvis was performed following the standard protocol without intravenous contrast.  Comparison: 10/16/2010  Findings: Atelectasis or infiltration in the lung bases.  Small esophageal hiatal hernia.  The unenhanced appearance of the liver, spleen, gallbladder, pancreas, adrenal glands, kidneys, abdominal aorta, and retroperitoneal lymph nodes is unremarkable.  The stomach, small bowel, and colon are not abnormally dilated.  No free air or free fluid in the abdomen.  Pelvis:  Metallic seed implants in the prostate gland.  Diffuse bladder wall thickening which could represent hypertrophy or cystitis.  No free or loculated pelvic fluid collections.  No significant lymphadenopathy in the pelvis.  Diverticulosis of the sigmoid colon without diverticulitis.  The appendix is normal. Degenerative changes in the lumbar spine.  Normal alignment of the lumbar vertebrae.  No focal bone lesions identified.  Abdominal wall musculature appears intact.  Small focal area of infiltration in the subcutaneous fat over the left flank region, associated with an acute fracture of the left eleventh rib with mild displacement.  IMPRESSION: Left 11th rib fracture with associated subcutaneous soft tissue hematoma.  No other acute abnormalities identified.  Original Report Authenticated By: Neale Burly, M.D.   Ct Head Wo Contrast  11/20/2011  *RADIOLOGY REPORT*  Clinical Data: Dizziness with subsequent fall.  CT HEAD WITHOUT CONTRAST  Technique:  Contiguous axial images were obtained from the base of the skull through the vertex without contrast.  Comparison: None.  Findings: Diffuse cerebral atrophy.  No mass effect or midline shift.  No abnormal extra-axial fluid collections.  Gray-white matter junctions are distinct.  Basal cisterns are not  effaced. Visualized paranasal sinuses and mastoid air cells are not opacified.  No  depressed skull fractures.  IMPRESSION: No acute intracranial abnormalities.  Original Report Authenticated By: Neale Burly, M.D.   Dg Chest Port 1 View  11/20/2011  *RADIOLOGY REPORT*  Clinical Data: Shortness of breath.  Dizziness.  Left lower posterior rib pain.  PORTABLE CHEST - 1 VIEW  Comparison: 10/01/2007  Findings: Shallow inspiration.  Normal heart size and pulmonary vascularity for technique.  No focal airspace consolidation or edema in the lungs.  No blunting of costophrenic angles.  No pneumothorax.  No significant changes since previous study, allowing for changes in technique.  IMPRESSION: No evidence of active pulmonary disease.  Original Report Authenticated By: Neale Burly, M.D.    ROS: Arthritis-no, life-style limiting-no;  Cramping of the legs and feet at night or with activity-no; Diabetes-no;  Hypothyroidism-h/o Hyperthyroidism 30 years ago;  Previous Stroke-no, Previous GI Bleed-no ,  Recent weight change-has gained 20 lbs.   in the last 3-4 months.   Other systems negative.  Blood pressure 129/62, pulse 41, temperature 99.3 F (37.4 C), temperature source Oral, resp. rate 3, SpO2 100.00%. General appearance: alert, cooperative, appears stated age and no distress Eyes: conjunctivae/corneas clear. PERRL, EOM's intact. Fundi benign. Neck: no adenopathy, no carotid bruit, no JVD, supple, symmetrical, trachea midline and thyroid not enlarged, symmetric, no tenderness/mass/nodules Neck: JVP - normal, carotids 2+= without bruits Resp: clear to auscultation bilaterally Chest wall: left sided chest wall tenderness Cardio: regular rate and rhythm, S1, S2 normal, no murmur, click, rub or gallop GI: soft, non-tender; bowel sounds normal; no masses,  no organomegaly Extremities: extremities normal, atraumatic, no cyanosis or edema Pulses: 2+ and symmetric Skin: Skin color, texture, turgor  normal. No rashes or lesions Neurologic: Alert and oriented X 3, normal strength and tone. Normal symmetric reflexes. Normal coordination and gait   Assessment/Plan 1. Syncope due to the heart block. Syncope due to ventricular and atrial standstill. Documented ventricular standstill of greater than 3 seconds while in the emergency department. This is due to probably underlying conduction system disease given his age and also medications. 2. Atrial Flutter  S/P  direct current cardioversion on 06/16/2011.  Doing well and maintaining sinus rhythm.  He states his energy level is improved. EKG in the emergency department reveals escape rhythm at a rate of 36 beats a minute. This was done at 4:23 AM this morning. However review of EKG done on 06/16/2011 reveals marked sinus bradycardia crit of 43 beats per minute. Telemetry rhythm strips reveal episodes of ventricle are standstill lasting greater than 4 seconds. Exercise sestamibi.   05/16/11: Ex: 5 min, 5.3 METs.   EKG: A.   Flutter.   Normal myocaridal perfusion.   EF 79%. 3. Hypertension.   4. Hyperlipidemia.  Recommendation: I suspect he has conduction system disease along with underlying atrial flutter as a nidus. I have discussed with Dr. Crissie Sickles to see whether he would be a candidate for permanent pacemaker implantation as a suspect he probably has underlying conduction system disease and would benefit from permanent pacemaker implantation. At this point out continue to hold his Cardizem and flecainide. I will place him in stepped-down unit into 2 heart block. Further recommendations will be per Dr. Crissie Sickles.   Laverda Page, MD 11/20/2011, 8:14 AM

## 2011-11-20 NOTE — ED Notes (Signed)
Pt. Asystole alarm on monitor went of for at least 3 secs. Pt. Placed on ED zoll pads. MD made aware.

## 2011-11-20 NOTE — ED Notes (Signed)
Pt. Arrived by ems. Pt reports he got up to go to the bathroom and he got dizzy and fell into the tub hitting his left side/back. No loc. Pt. HR is 35 asymptomatic bp 115/70. Pt. With hx of afib. 20G IV right hand.

## 2011-11-20 NOTE — ED Notes (Signed)
Pt with 3 episodes of asystole lasting approx 3 sec and multiple pauses.  Pt assessed. Denies chest pain or SOB with episodes. Rhythm strips printed, placed on chart. ED physician aware. Pt placed on pads and zoll. Atropine at bedside. No new orders.

## 2011-11-20 NOTE — Progress Notes (Addendum)
Patient ID: [name] [name], male   DOB: 23-Mar-1936, 76 y.o.   MRN: [id number]    Patient ID: [name] [name] MRN: [id number] DOB/AGE: Nov 13, 1935 76 y.o. Admit date: 11/20/2011  Primary Care Physician:No primary provider on file. Primary Cardiologist: Dr Einar Gip Chief complain: History of a fall and pain on right side of his chest since last night  HPI: [name] [name] is a very pleasant 76 year old African American male with history of prostate cancer which is stable, hyperlipidemia, hypertension who has had an episode of atrial flutter.  Last night at around 11 PM, [name] [name] fell in the bathroom hitting his right chest against the bathtub. He reports suddenly feeling dizzy before falling. He is daughter who was present reports that he he was unconscious for 2 minutes. He regained his consciousness fully. No history of seizures but the daughter reports the he was shaking after the fall. The patient was rushed to the ED for further evaluation. He denies history of chest pain, difficulty breathing, orthopnea, or diaphoresis. He has had 3 similar falls in the past with the last one being 3 months ago. He has never had injuries associated with this falls before. He is reporting severe chest pain on the right side and he rates his pain to be 10 out of 10 improving slightly after morphine administration. However, he denies shortness of breath. He does not report any weakness in his extremities.  For the past 7 months he is being seen by Dr Einar Gip as his primary cardiologist. He is on flecainide and also Cardizem for his atrial flutter and hypertension. He has had a negative nuclear stress test in January of 2013. He underwent successful cardioversion of atrial flutter to sinus rhythm on 06/16/2011. He been doing well since then.    Past Medical History  Diagnosis Date  . Prostate cancer   . Atrial flutter   . Hypertension 04/30/11    Cardioversion 06/16/11  . Hyperlipemia     Past Surgical History  Procedure  Date  . Cardioversion 06/16/2011    Procedure: CARDIOVERSION;  Surgeon: Laverda Page, MD;  Location: [place];  Service: Cardiovascular;  Laterality: N/A;    History reviewed. No pertinent family history.  History   Social History  . Marital Status: Single    Spouse Name: N/A    Number of Children: N/A  . Years of Education: N/A   Occupational History  . Not on file.   Social History Main Topics  . Smoking status: Never Smoker   . Smokeless tobacco: Not on file  . Alcohol Use: No  . Drug Use: No  . Sexually Active:    Other Topics Concern  . Not on file   Social History Narrative  . No narrative on file     Prescriptions prior to admission  Medication Sig Dispense Refill  . diltiazem (CARDIZEM CD) 180 MG 24 hr capsule Take 180 mg by mouth daily.      . flecainide (TAMBOCOR) 50 MG tablet Take 50 mg by mouth 2 (two) times daily.      Marland Kitchen olmesartan-hydrochlorothiazide (BENICAR HCT) 40-12.5 MG per tablet Take 1 tablet by mouth daily.      . simvastatin (ZOCOR) 20 MG tablet Take 20 mg by mouth every evening.      . Tamsulosin HCl (FLOMAX) 0.4 MG CAPS Take 0.4 mg by mouth 2 (two) times daily.         General: no fevers/chills/night sweats/weight loss Eyes: no blurry vision, diplopia, or amaurosis ENT:  no sore throat or hearing loss Resp: no cough, wheezing, or hemoptysis CV: no edema or palpitations GI: no abdominal pain, nausea, vomiting, diarrhea, or constipation GU: no dysuria, frequency, or hematuria Skin: no rash Neuro: no headache, numbness, tingling, or weakness of extremities Musculoskeletal: no joint pain or swelling Heme: no bleeding, DVT, or easy bruising Endo: no polydipsia or polyuria  Physical Exam: Blood pressure 198/66, pulse 71, temperature 98.3 F (36.8 C), temperature source Oral, resp. rate 21, SpO2 100.00%.  Pt is alert and oriented x3, in no distress. HEENT: normal. No visible injuries to his head. Neck: JVP normal. Carotid upstrokes normal  without bruits. No thyromegaly. Lungs: equal expansion, clear bilaterally. There is chest wall tenderness around the 11th grade on the right side posteriorly. No hematoma subcutaneous emphysema. No signs of a pneumothorax. CV: Apex is discrete and nondisplaced, RRR without murmur or gallop Abd: soft, NT, +BS, no bruit, no hepatosplenomegaly Ext: no C/C/E        Femoral pulses 2+= without bruits        DP/PT pulses intact and = Skin: warm and dry without rash Neuro: CNII-XII intact             Strength intact = bilaterally  Labs:   Lab Results  Component Value Date   WBC 7.6 11/20/2011   HGB 13.4 11/20/2011   HCT 39.4 11/20/2011   MCV 91.0 11/20/2011   PLT 190 11/20/2011    Lab 11/20/11 0430  NA 145  K 4.2  CL 107  CO2 24  BUN 25*  CREATININE 1.59*  CALCIUM 9.4  PROT 6.9  BILITOT 0.3  ALKPHOS 67  ALT 16  AST 24  GLUCOSE 172*   Lab Results  Component Value Date   CKTOTAL 170 11/20/2011   CKMB 3.3 11/20/2011   TROPONINI <0.30 11/20/2011       Radiology: Ct Abdomen Pelvis Wo Contrast  11/20/2011  *RADIOLOGY REPORT*  Clinical Data: Left side and back pain after fall.  CT ABDOMEN AND PELVIS WITHOUT CONTRAST  Technique:  Multidetector CT imaging of the abdomen and pelvis was performed following the standard protocol without intravenous contrast.  Comparison: 10/16/2010  Findings: Atelectasis or infiltration in the lung bases.  Small esophageal hiatal hernia.  The unenhanced appearance of the liver, spleen, gallbladder, pancreas, adrenal glands, kidneys, abdominal aorta, and retroperitoneal lymph nodes is unremarkable.  The stomach, small bowel, and colon are not abnormally dilated.  No free air or free fluid in the abdomen.  Pelvis:  Metallic seed implants in the prostate gland.  Diffuse bladder wall thickening which could represent hypertrophy or cystitis.  No free or loculated pelvic fluid collections.  No significant lymphadenopathy in the pelvis.  Diverticulosis of the sigmoid colon  without diverticulitis.  The appendix is normal. Degenerative changes in the lumbar spine.  Normal alignment of the lumbar vertebrae.  No focal bone lesions identified.  Abdominal wall musculature appears intact.  Small focal area of infiltration in the subcutaneous fat over the left flank region, associated with an acute fracture of the left eleventh rib with mild displacement.  IMPRESSION: Left 11th rib fracture with associated subcutaneous soft tissue hematoma.  No other acute abnormalities identified.  Original Report Authenticated By: Neale Burly, M.D.   Ct Head Wo Contrast  11/20/2011  *RADIOLOGY REPORT*  Clinical Data: Dizziness with subsequent fall.  CT HEAD WITHOUT CONTRAST  Technique:  Contiguous axial images were obtained from the base of the skull through the vertex without contrast.  Comparison: None.  Findings: Diffuse cerebral atrophy.  No mass effect or midline shift.  No abnormal extra-axial fluid collections.  Gray-white matter junctions are distinct.  Basal cisterns are not effaced. Visualized paranasal sinuses and mastoid air cells are not opacified.  No depressed skull fractures.  IMPRESSION: No acute intracranial abnormalities.  Original Report Authenticated By: Neale Burly, M.D.   Dg Chest Port 1 View  11/20/2011  *RADIOLOGY REPORT*  Clinical Data: Shortness of breath.  Dizziness.  Left lower posterior rib pain.  PORTABLE CHEST - 1 VIEW  Comparison: 10/01/2007  Findings: Shallow inspiration.  Normal heart size and pulmonary vascularity for technique.  No focal airspace consolidation or edema in the lungs.  No blunting of costophrenic angles.  No pneumothorax.  No significant changes since previous study, allowing for changes in technique.  IMPRESSION: No evidence of active pulmonary disease.  Original Report Authenticated By: Neale Burly, M.D.   EKG: Junctional escape rhythm with a ventricular rate of 30 to 40 beats per minute. telemetrty shows sinus arrest and  pauses of 3-4 seconds  ASSESSMENT AND PLAN:  1. Syncope: The cause of the patient's presentation of a fall is most likely due to bradycardia and atrial flutter complicated by syncope. He been treated with antiarrhythmics medication including cardizem and Flecainide in addition to cardioversion. He continues to have recurrent syncope and falls. Given his age these falls pose a danger of head injuries. He has responded to 2 doses of Atropine 0.5mg . He has remained hemodynamically stable since arrival to the ED. chest x-ray reveals fracture of the right 11th rib But no features of pneumothorax. Brain CT scan doesn't reveal any acute brain injury. However his heart rate has remained fluctuating from 30 is to 70s. This patient is likely failing medical therapy and will discuss with Dr Lovena Le further in regard to a permanent pace maker during this admission. Another option to consider is atrial ablation therapy. In the interim, we will monitor for any signs of hemodynamic instability. Will keep pacing pads on.   3. A right posterior chest pain Patient sustained a fracture of the 11th rib following his fall. He reports severe chest pain. However he does not have pneumothorax as from exam and the chest x-ray. Will continue with pain meds since. I spoke to him about conservative management he makes of such rib fractures.   4. Hypertension:  He has blood pressures have remained stable.  A thiazide diuretic would be more preferable medication for this hypertension and care to avoid beta blockers given his bradycardia.  5.Disposition: He will remain in the CCU in the meantime we will close monitoring.   Signed: Jessee Avers 11/20/2011, 12:03 PM Co-Sign MD  With a history of atrial flutter treated with flecainide and diltiazem with known sinus bradycardia identified as long ago as February. Notably he has a CHADS-VASc score of greater than or equal to 2 and has been treated with aspirin.  He comes in  today with syncope with documented sinus node dysfunction with pauses and junctional escape.  2 strategies present themselves. The first is to ablate his atrial flutter substrate and avoid the need for either an antiarrhythmic drug with potential for sudden sinus node dysfunction or a calcium channel blocker. In addition, it obviously need for long-term anticoagulation. The second strategy would be to continue him on his antiarrhythmic therapy, undertake pacing for sinus node dysfunction and continue him on his anticoagulation. I have reviewed the above with Dr. Einar Gip into her agreeable  with the above recommendation.  Hence we will #1 discontinue flecainide and Cardizem #2 observe on the monitor for 48-72 hours to make sure no further bradycardia persists following drug limitation #3 anticipate catheter ablation next week  Thanks for consult    Virl Axe, MD 11/20/2011 5:02 PM

## 2011-11-20 NOTE — ED Notes (Addendum)
Pt continues to have multiple episodes of asystole lasting between 3-4 seconds. All strips printed and placed on chart. Pt remains asymptomatic, denies chest or SOB. Dr. Einar Gip paged.

## 2011-11-20 NOTE — ED Notes (Signed)
New orders received to give patient 0.5mg  atropine

## 2011-11-20 NOTE — Progress Notes (Signed)
Initial review for intermediate inpatient status is complete.

## 2011-11-21 ENCOUNTER — Encounter (HOSPITAL_COMMUNITY)
Admission: EM | Disposition: A | Discharge status: Home / Self Care | Payer: Self-pay | Source: Home / Self Care | Attending: Internal Medicine

## 2011-11-21 ENCOUNTER — Inpatient Hospital Stay (HOSPITAL_COMMUNITY): Payer: Medicare Other | Admitting: *Deleted

## 2011-11-21 ENCOUNTER — Encounter (HOSPITAL_COMMUNITY): Payer: Self-pay | Admitting: *Deleted

## 2011-11-21 DIAGNOSIS — I4892 Unspecified atrial flutter: Secondary | ICD-10-CM

## 2011-11-21 DIAGNOSIS — I498 Other specified cardiac arrhythmias: Secondary | ICD-10-CM

## 2011-11-21 HISTORY — PX: ATRIAL FLUTTER ABLATION: SHX5733

## 2011-11-21 LAB — HEMOGLOBIN A1C
Hgb A1c MFr Bld: 6.2 % — ABNORMAL HIGH (ref <5.7)
Mean Plasma Glucose: 131 mg/dL — ABNORMAL HIGH (ref <117)

## 2011-11-21 LAB — BASIC METABOLIC PANEL
BUN: 17 mg/dL (ref 6–23)
CO2: 23 mEq/L (ref 19–32)
Chloride: 108 mEq/L (ref 96–112)
Creatinine, Ser: 1.19 mg/dL (ref 0.50–1.35)

## 2011-11-21 SURGERY — ATRIAL FLUTTER ABLATION
Anesthesia: LOCAL

## 2011-11-21 MED ORDER — ONDANSETRON HCL 4 MG/2ML IJ SOLN: 4.0000 mg | Freq: Four times a day (QID) | INTRAMUSCULAR | Status: DC | PRN

## 2011-11-21 MED ORDER — FENTANYL CITRATE 0.05 MG/ML IJ SOLN
INTRAMUSCULAR | Status: DC | PRN
  Administered 2011-11-21: 25 ug via INTRAVENOUS
  Administered 2011-11-21: 50 ug via INTRAVENOUS
  Administered 2011-11-21 (×3): 25 ug via INTRAVENOUS

## 2011-11-21 MED ORDER — AMLODIPINE BESYLATE 10 MG PO TABS
10.0000 mg | ORAL_TABLET | Freq: Every day | ORAL | Status: DC
  Administered 2011-11-21 – 2011-11-24 (×4): 10 mg via ORAL
  Filled 2011-11-21 (×4): qty 1

## 2011-11-21 MED ORDER — HYDRALAZINE HCL 20 MG/ML IJ SOLN
10.0000 mg | Freq: Four times a day (QID) | INTRAMUSCULAR | Status: DC
  Administered 2011-11-21 – 2011-11-22 (×4): 10 mg via INTRAVENOUS
  Filled 2011-11-21: qty 0.5
  Filled 2011-11-21 (×2): qty 1
  Filled 2011-11-21: qty 0.5
  Filled 2011-11-21: qty 1
  Filled 2011-11-21: qty 0.5
  Filled 2011-11-21: qty 1
  Filled 2011-11-21: qty 0.5

## 2011-11-21 MED ORDER — HYDRALAZINE HCL 20 MG/ML IJ SOLN: 10.0000 mg | INTRAMUSCULAR | Status: DC | PRN

## 2011-11-21 MED ORDER — SODIUM CHLORIDE 0.9 % IV SOLN: 250.0000 mL | INTRAVENOUS | Status: DC | PRN

## 2011-11-21 MED ORDER — MIDAZOLAM HCL 5 MG/5ML IJ SOLN
INTRAMUSCULAR | Status: DC | PRN
  Administered 2011-11-21: 1 mg via INTRAVENOUS

## 2011-11-21 MED ORDER — HYDROCODONE-ACETAMINOPHEN 5-325 MG PO TABS
1.0000 | ORAL_TABLET | ORAL | Status: DC | PRN
Start: 2011-11-21 — End: 2011-11-24
  Administered 2011-11-21 – 2011-11-23 (×4): 2 via ORAL
  Filled 2011-11-21 (×4): qty 2

## 2011-11-21 MED ORDER — SODIUM CHLORIDE 0.9 % IJ SOLN
3.0000 mL | Freq: Two times a day (BID) | INTRAMUSCULAR | Status: DC
  Administered 2011-11-21 – 2011-11-24 (×6): 3 mL via INTRAVENOUS

## 2011-11-21 MED ORDER — SODIUM CHLORIDE 0.9 % IV SOLN
INTRAVENOUS | Status: DC
  Administered 2011-11-21: 10:00:00 via INTRAVENOUS

## 2011-11-21 MED ORDER — ACETAMINOPHEN 325 MG PO TABS: 650.0000 mg | ORAL_TABLET | ORAL | Status: DC | PRN

## 2011-11-21 MED ORDER — SODIUM CHLORIDE 0.9 % IJ SOLN: 3.0000 mL | INTRAMUSCULAR | Status: DC | PRN

## 2011-11-21 MED ORDER — PROPOFOL 10 MG/ML IV EMUL
INTRAVENOUS | Status: DC | PRN
  Administered 2011-11-21: 180 mg via INTRAVENOUS

## 2011-11-21 MED ORDER — BUPIVACAINE HCL (PF) 0.25 % IJ SOLN
INTRAMUSCULAR | Status: AC
  Filled 2011-11-21: qty 60

## 2011-11-21 MED ORDER — SODIUM CHLORIDE 0.9 % IV SOLN
INTRAVENOUS | Status: DC | PRN
  Administered 2011-11-21: 12:00:00 via INTRAVENOUS

## 2011-11-21 MED ORDER — EPHEDRINE SULFATE 50 MG/ML IJ SOLN
INTRAMUSCULAR | Status: DC | PRN
  Administered 2011-11-21 (×2): 5 mg via INTRAVENOUS

## 2011-11-21 NOTE — Anesthesia Preprocedure Evaluation (Signed)
Anesthesia Evaluation  Patient identified by MRN, date of birth, ID band Patient awake    Reviewed: Allergy & Precautions, H&P , NPO status , Patient's Chart, lab work & pertinent test results  History of Anesthesia Complications Negative for: history of anesthetic complications  Airway Mallampati: II TM Distance: >3 FB     Dental  (+) Edentulous Upper, Edentulous Lower and Dental Advisory Given   Pulmonary former smoker,          Cardiovascular hypertension, Pt. on medications + dysrhythmias Atrial Fibrillation     Neuro/Psych negative neurological ROS  negative psych ROS   GI/Hepatic negative GI ROS, Neg liver ROS,   Endo/Other    Renal/GU negative Renal ROS     Musculoskeletal   Abdominal   Peds  Hematology negative hematology ROS (+)   Anesthesia Other Findings   Reproductive/Obstetrics                           Anesthesia Physical Anesthesia Plan  ASA: III  Anesthesia Plan: General   Post-op Pain Management:    Induction: Intravenous  Airway Management Planned: LMA  Additional Equipment:   Intra-op Plan:   Post-operative Plan:   Informed Consent:   Dental advisory given  Plan Discussed with: CRNA, Anesthesiologist and Surgeon  Anesthesia Plan Comments:         Anesthesia Quick Evaluation

## 2011-11-21 NOTE — Transfer of Care (Signed)
Immediate Anesthesia Transfer of Care Note  Patient: [name] [name]  Procedure(s) Performed: Procedure(s) (LRB): ATRIAL FLUTTER ABLATION (N/A)  Patient Location: PACU  Anesthesia Type: General  Level of Consciousness: awake, alert , oriented and patient cooperative  Airway & Oxygen Therapy: Patient Spontanous Breathing and Patient connected to nasal cannula oxygen  Post-op Assessment: Report given to PACU RN, Post -op Vital signs reviewed and stable, Patient moving all extremities, Patient moving all extremities X 4 and Patient able to stick tongue midline  Post vital signs: Reviewed and stable  Complications: No apparent anesthesia complications

## 2011-11-21 NOTE — Preoperative (Signed)
Beta Blockers   Reason not to administer Beta Blockers:Not Applicable 

## 2011-11-21 NOTE — Progress Notes (Signed)
Subjective:  Presently doing well and has not had any recurrence of ventricular standstill. Patient's family is at the bedside. Blood pressure has remained elevated. No chest pain, shortness of breath, syncope. No dizziness.  Objective:  Vital Signs in the last 24 hours: Temp:  [98.3 F (36.8 C)-98.7 F (37.1 C)] 98.7 F (37.1 C) (07/19 0831) Pulse Rate:  [41-72] 51  (07/19 0831) Resp:  [7-21] 17  (07/19 0400) BP: (114-215)/(24-90) 215/73 mmHg (07/19 0836) SpO2:  [96 %-100 %] 96 % (07/19 0836) FiO2 (%):  [100 %] 100 % (07/18 1143) Weight:  [71.3 kg (157 lb 3 oz)] 71.3 kg (157 lb 3 oz) (07/18 1130)  Intake/Output from previous day: 07/18 0701 - 07/19 0700 In: 1350 [I.V.:1350] Out: 775 [Urine:775]  Physical Exam:   General appearance: alert, cooperative, appears stated age and no distress Eyes: conjunctivae/corneas clear. PERRL, EOM's intact. Fundi benign. Neck: no adenopathy, no carotid bruit, no JVD, supple, symmetrical, trachea midline and thyroid not enlarged, symmetric, no tenderness/mass/nodules Neck: JVP - normal, carotids 2+= without bruits Resp: clear to auscultation bilaterally Chest wall: left sided chest wall tenderness Cardio: regular rate and rhythm, S1, S2 normal, no murmur, click, rub or gallop GI: soft, non-tender; bowel sounds normal; no masses,  no organomegaly Extremities: extremities normal, atraumatic, no cyanosis or edema    Lab Results:  Basename 11/20/11 0430  WBC 7.6  HGB 13.4  PLT 190    Basename 11/21/11 0604 11/20/11 0430  NA 140 145  K 3.8 4.2  CL 108 107  CO2 23 24  GLUCOSE 102* 172*  BUN 17 25*  CREATININE 1.19 1.59*    Basename 11/20/11 0430  TROPONINI <0.30   Hepatic Function Panel  Basename 11/20/11 0430  PROT 6.9  ALBUMIN 3.8  AST 24  ALT 16  ALKPHOS 28  BILITOT 0.3  BILIDIR --  IBILI --    Imaging: Imaging results have been reviewed  Cardiac Studies:  Assessment/Plan:  1. Atrial flutter, typical. Failed  flecainide do to its side effects causing ventricle standstill. Patient evaluated by Dr. Jolyn Nap. I greatly appreciate their input and agree with the approach for EP evaluation and possible ablation. 2. Hypertension uncontrolled. I will start the patient on amlodipine 10 mg by mouth daily along with hydralazine 10 mg IV every 6 hours followed by every 4 hours when necessary. I will request Dr. Jolyn Nap to take over the care of Mr. Scheirer and I will follow him up as an outpatient basis after EP evaluation is complete. 3. I will check HbA1c on his next blood draw. His serum creatinine is normal. His creatinine clearance is normal.   Laverda Page, M.D. 11/21/2011, 8:53 AM

## 2011-11-21 NOTE — Progress Notes (Signed)
SUBJECTIVE: The patient is doing well today.  At this time, he denies chest pain, shortness of breath, or any new concerns.     . sodium chloride   Intravenous STAT  . aspirin EC  81 mg Oral Daily  . atropine  0.5 mg Intravenous Once  . irbesartan  300 mg Oral Daily   And  . hydrochlorothiazide  12.5 mg Oral Daily  . simvastatin  20 mg Oral q1800  . sodium chloride  3 mL Intravenous Q12H  . sodium chloride  3 mL Intravenous Q12H  . Tamsulosin HCl  0.4 mg Oral BID  . DISCONTD: olmesartan-hydrochlorothiazide  1 tablet Oral Daily      . sodium chloride 75 mL/hr at 11/21/11 0240    OBJECTIVE: Physical Exam: Filed Vitals:   11/21/11 0000 11/21/11 0022 11/21/11 0400 11/21/11 0416  BP: 130/53  114/41   Pulse: 47  41   Temp:  98.6 F (37 C)  98.3 F (36.8 C)  TempSrc:  Oral  Oral  Resp:   17   Height:      Weight:      SpO2: 97%  98%     Intake/Output Summary (Last 24 hours) at 11/21/11 0752 Last data filed at 11/21/11 0700  Gross per 24 hour  Intake   1350 ml  Output    775 ml  Net    575 ml    Telemetry reveals sinus rhythm, noctural bradycardia (HR 30s-40s) with no further pauses, presently sinus 55 bpm  GEN- The patient is well appearing, alert and oriented x 3 today.   Head- normocephalic, atraumatic Eyes-  Sclera clear, conjunctiva pink Ears- hearing intact Oropharynx- clear Neck- supple,   Lungs- Clear to ausculation bilaterally, normal work of breathing Heart- Regular rate and rhythm,  GI- soft, NT, ND, + BS Extremities- no clubbing, cyanosis, or edema Skin- no rash or lesion Psych- euthymic mood, full affect Neuro- strength and sensation are intact  LABS: Basic Metabolic Panel:  Basename 11/21/11 0604 11/20/11 0430  NA 140 145  K 3.8 4.2  CL 108 107  CO2 23 24  GLUCOSE 102* 172*  BUN 17 25*  CREATININE 1.19 1.59*  CALCIUM 8.5 9.4  MG -- --  PHOS -- --   Liver Function Tests:  Basename 11/20/11 0430  AST 24  ALT 16  ALKPHOS 67    BILITOT 0.3  PROT 6.9  ALBUMIN 3.8   No results found for this basename: LIPASE:2,AMYLASE:2 in the last 72 hours CBC:  Basename 11/20/11 0430  WBC 7.6  NEUTROABS --  HGB 13.4  HCT 39.4  MCV 91.0  PLT 190   Cardiac Enzymes:  Basename 11/20/11 0430  CKTOTAL 170  CKMB 3.3  CKMBINDEX --  TROPONINI <0.30     ASSESSMENT AND PLAN:  1. Bradycardia/ syncope- I agree with Dr Caryl Comes that our initial approach should be to stop flecainide and diltiazem.  Hopefully, as these medicines wash out his bradycardia will resolve.  He is aware that should he have further pauses/ symptomatic bradycardia that he may indeed require a PPM. We will observe on telemetry over the weekend.  2. Atrial flutter-  The patient had symptomatic atrial flutter 2/13.  I have reviewed ekgs which reveal typical atrial flutter.  I agree with Dr Caryl Comes that our most prudent step would be to proceed with flutter ablation to be able to discontinue flecainide/ diltiazem and avoid the risks of stroke long term. Therapeutic strategies for atrial flutter including medicine  and ablation were discussed in detail with the patient and his children today. Risk, benefits, and alternatives to EP study and radiofrequency ablation were also discussed in detail today. These risks include but are not limited to stroke, bleeding, vascular damage, tamponade, perforation, damage to the heart and other structures, AV block requiring pacemaker, worsening renal function, and death. The patient understands these risk and wishes to proceed.  We will therefore proceed with catheter ablation this afternoon.    Thompson Grayer, MD 11/21/2011 7:52 AM

## 2011-11-21 NOTE — Brief Op Note (Signed)
11/20/2011 - 11/21/2011  3:03 PM  PATIENT:  [name] [name]  76 y.o. male  PRE-OPERATIVE DIAGNOSIS:  Atrial Flutter,  sick sinus syndrome  POST-OPERATIVE DIAGNOSIS: Atrial flutter, sick sinus syndrome  PROCEDURE:  Procedure(s) (LRB): ATRIAL FLUTTER ABLATION (N/A)  SURGEON:  Surgeon(s) and Role:    * Thompson Grayer, MD - Primary  PHYSICIAN ASSISTANT:   ASSISTANTS: none   ANESTHESIA:   general  EBL:  Total I/O In: 375 [I.V.:375] Out: 700 [Urine:700]  BLOOD ADMINISTERED:none  DRAINS: none   LOCAL MEDICATIONS USED:  NONE  SPECIMEN:  No Specimen  DISPOSITION OF SPECIMEN:  N/A  COUNTS:  YES  TOURNIQUET:  * No tourniquets in log *  DICTATION: .Other Dictation: Dictation Number [id number]  PLAN OF CARE: Admit to inpatient   PATIENT DISPOSITION:  PACU - hemodynamically stable.   Delay start of Pharmacological VTE agent (>24hrs) due to surgical blood loss or risk of bleeding: yes

## 2011-11-22 DIAGNOSIS — I4892 Unspecified atrial flutter: Secondary | ICD-10-CM

## 2011-11-22 MED ORDER — ASPIRIN 81 MG PO CHEW
81.0000 mg | CHEWABLE_TABLET | Freq: Every day | ORAL | Status: DC
  Administered 2011-11-22 – 2011-11-24 (×3): 81 mg via ORAL
  Filled 2011-11-22 (×3): qty 1

## 2011-11-22 MED ORDER — HEPARIN SODIUM (PORCINE) 5000 UNIT/ML IJ SOLN
5000.0000 [IU] | Freq: Three times a day (TID) | INTRAMUSCULAR | Status: DC
  Administered 2011-11-22 – 2011-11-24 (×5): 5000 [IU] via SUBCUTANEOUS
  Filled 2011-11-22 (×9): qty 1

## 2011-11-22 MED ORDER — HYDRALAZINE HCL 25 MG PO TABS
25.0000 mg | ORAL_TABLET | Freq: Three times a day (TID) | ORAL | Status: DC
  Administered 2011-11-22 – 2011-11-24 (×5): 25 mg via ORAL
  Filled 2011-11-22 (×9): qty 1

## 2011-11-22 NOTE — Progress Notes (Signed)
Patient ID: [name] [name], male   DOB: [birth year], 76 y.o.   MRN: [id number]    SUBJECTIVE: Patient in NSR, HR upper 40s-50s this morning.  No significant bradycardic episodes.      Marland Kitchen amLODipine  10 mg Oral Daily  . aspirin  81 mg Oral Daily  . bupivacaine      . heparin subcutaneous  5,000 Units Subcutaneous Q8H  . hydrALAZINE  25 mg Oral Q8H  . irbesartan  300 mg Oral Daily   And  . hydrochlorothiazide  12.5 mg Oral Daily  . simvastatin  20 mg Oral q1800  . sodium chloride  3 mL Intravenous Q12H  . Tamsulosin HCl  0.4 mg Oral BID  . DISCONTD: aspirin EC  81 mg Oral Daily  . DISCONTD: hydrALAZINE  10 mg Intravenous Q6H  . DISCONTD: sodium chloride  3 mL Intravenous Q12H  . DISCONTD: sodium chloride  3 mL Intravenous Q12H       Filed Vitals:   11/22/11 0342 11/22/11 0344 11/22/11 0751 11/22/11 0753  BP:  108/55  120/47  Pulse:      Temp: 98.6 F (37 C)  98.1 F (36.7 C)   TempSrc: Oral  Oral   Resp:   18   Height:      Weight:      SpO2: 94%  96%     Intake/Output Summary (Last 24 hours) at 11/22/11 1020 Last data filed at 11/22/11 0800  Gross per 24 hour  Intake   1750 ml  Output   2675 ml  Net   -925 ml    LABS: Basic Metabolic Panel:  Basename 11/21/11 0604 11/20/11 0430  NA 140 145  K 3.8 4.2  CL 108 107  CO2 23 24  GLUCOSE 102* 172*  BUN 17 25*  CREATININE 1.19 1.59*  CALCIUM 8.5 9.4  MG -- --  PHOS -- --   Liver Function Tests:  Basename 11/20/11 0430  AST 24  ALT 16  ALKPHOS 67  BILITOT 0.3  PROT 6.9  ALBUMIN 3.8   No results found for this basename: LIPASE:2,AMYLASE:2 in the last 72 hours CBC:  Basename 11/20/11 0430  WBC 7.6  NEUTROABS --  HGB 13.4  HCT 39.4  MCV 91.0  PLT 190   Cardiac Enzymes:  Basename 11/20/11 0430  CKTOTAL 170  CKMB 3.3  CKMBINDEX --  TROPONINI <0.30   BNP: No components found with this basename: POCBNP:3 D-Dimer: No results found for this basename: DDIMER:2 in the last 72 hours Hemoglobin  A1C:  Basename 11/21/11 0939  HGBA1C 6.2*   Fasting Lipid Panel: No results found for this basename: CHOL,HDL,LDLCALC,TRIG,CHOLHDL,LDLDIRECT in the last 72 hours Thyroid Function Tests: No results found for this basename: TSH,T4TOTAL,FREET3,T3FREE,THYROIDAB in the last 72 hours Anemia Panel: No results found for this basename: VITAMINB12,FOLATE,FERRITIN,TIBC,IRON,RETICCTPCT in the last 72 hours  RADIOLOGY: Ct Abdomen Pelvis Wo Contrast  11/20/2011  *RADIOLOGY REPORT*  Clinical Data: Left side and back pain after fall.  CT ABDOMEN AND PELVIS WITHOUT CONTRAST  Technique:  Multidetector CT imaging of the abdomen and pelvis was performed following the standard protocol without intravenous contrast.  Comparison: 10/16/2010  Findings: Atelectasis or infiltration in the lung bases.  Small esophageal hiatal hernia.  The unenhanced appearance of the liver, spleen, gallbladder, pancreas, adrenal glands, kidneys, abdominal aorta, and retroperitoneal lymph nodes is unremarkable.  The stomach, small bowel, and colon are not abnormally dilated.  No free air or free fluid in the abdomen.  Pelvis:  Metallic  seed implants in the prostate gland.  Diffuse bladder wall thickening which could represent hypertrophy or cystitis.  No free or loculated pelvic fluid collections.  No significant lymphadenopathy in the pelvis.  Diverticulosis of the sigmoid colon without diverticulitis.  The appendix is normal. Degenerative changes in the lumbar spine.  Normal alignment of the lumbar vertebrae.  No focal bone lesions identified.  Abdominal wall musculature appears intact.  Small focal area of infiltration in the subcutaneous fat over the left flank region, associated with an acute fracture of the left eleventh rib with mild displacement.  IMPRESSION: Left 11th rib fracture with associated subcutaneous soft tissue hematoma.  No other acute abnormalities identified.  Original Report Authenticated By: Neale Burly, M.D.   Ct  Head Wo Contrast  11/20/2011  *RADIOLOGY REPORT*  Clinical Data: Dizziness with subsequent fall.  CT HEAD WITHOUT CONTRAST  Technique:  Contiguous axial images were obtained from the base of the skull through the vertex without contrast.  Comparison: None.  Findings: Diffuse cerebral atrophy.  No mass effect or midline shift.  No abnormal extra-axial fluid collections.  Gray-white matter junctions are distinct.  Basal cisterns are not effaced. Visualized paranasal sinuses and mastoid air cells are not opacified.  No depressed skull fractures.  IMPRESSION: No acute intracranial abnormalities.  Original Report Authenticated By: Neale Burly, M.D.   Dg Chest Port 1 View  11/20/2011  *RADIOLOGY REPORT*  Clinical Data: Shortness of breath.  Dizziness.  Left lower posterior rib pain.  PORTABLE CHEST - 1 VIEW  Comparison: 10/01/2007  Findings: Shallow inspiration.  Normal heart size and pulmonary vascularity for technique.  No focal airspace consolidation or edema in the lungs.  No blunting of costophrenic angles.  No pneumothorax.  No significant changes since previous study, allowing for changes in technique.  IMPRESSION: No evidence of active pulmonary disease.  Original Report Authenticated By: Neale Burly, M.D.    PHYSICAL EXAM General: NAD Neck: No JVD, no thyromegaly or thyroid nodule.  Lungs: Clear to auscultation bilaterally with normal respiratory effort. CV: Nondisplaced PMI.  Heart regular S1/S2, +S4, no murmur.  No peripheral edema.  No carotid bruit.  Normal pedal pulses.  Abdomen: Soft, nontender, no hepatosplenomegaly, no distention.  Neurologic: Alert and oriented x 3.  Psych: Normal affect. Extremities: No clubbing or cyanosis.   TELEMETRY: Reviewed telemetry pt in sinus brady, HR upper 40s-50s  ASSESSMENT AND PLAN: 76 yo with history of syncope/falls found to have tachy-brady syndrome with atrial flutter and sinus bradycardia.  1. Atrial flutter: s/p ablation yesterday.   He is in NSR and was in NSR at the time of ablation.  He has not been on anticoagulation due to falls, concern for head injury.  I will put him on ASA.  2. Bradycardia: Symptomatic bradycardia with syncope/falls.  He has been stable on telemetry since stopping nodal blockers with only mild bradycardia.  Will observe today, potential home tomorrow if no bradycardic events.  Would arrange for event monitor at discharge.  3.  HTN: BP better.  Change hydralazine to po.   Loralie Champagne 11/22/2011 10:24 AM

## 2011-11-22 NOTE — Op Note (Signed)
[name], [name]                 ACCOUNT NO.:  [id number]  MEDICAL RECORD NO.:  [id number]  LOCATION:  2916                         FACILITY:  [facility]  PHYSICIAN:  Thompson Grayer, MD       DATE OF BIRTH:  02/25/1936  DATE OF PROCEDURE:  11/21/2011 DATE OF DISCHARGE:                              OPERATIVE REPORT   SURGEON:  Thompson Grayer, MD  PREPROCEDURE DIAGNOSES: 1. Sick sinus syndrome. 2. Typical atrial flutter.  POSTPROCEDURE DIAGNOSES: 1. Sick sinus syndrome. 2. Typical atrial flutter.  PROCEDURES: 1. Comprehensive EP study. 2. Coronary sinus pacing and recording. 3. Mapping of atrial flutter. 4. Ablation of atrial flutter. 5. Arrhythmia induction with pacing.  INTRODUCTION:  [name] [name] is a pleasant 76 year old gentleman with recently diagnosed typical atrial flutter.  He was placed on flecainide and diltiazem.  He has subsequently began having episodes of syncope. He has been evaluated and found to have sinus pauses in excess of 3 seconds, which were felt to be [name] cause of [name] syncope.  It is felt most prudent to discontinue flecainide and diltiazem in hopes that his sinus node will recover.  It is therefore felt most prudent to proceed with atrial flutter ablation to prevent recurrence of atrial flutter as he is off AV nodal agents going forward.  DESCRIPTION OF PROCEDURE:  Informed written consent was obtained, and [name] patient was brought to [name] electrophysiology lab in a fasting state. He was adequately sedated with anesthesia as outlined in [name] anesthesia report.  [name] patient's right groin was prepped and draped in usual sterile fashion by [name] EP lab staff.  Using a percutaneous Seldinger technique, one 6, one 7, and one 8-French hemostasis sheaths were placed into [name] right common femoral vein.  A 6-French decapolar [name] [name] catheter was introduced through [name] right common femoral vein and advanced into [name] coronary sinus for recording and pacing from  this location.  A 6-French quadripolar [name] [name] catheter was introduced through [name] right common femoral vein and advanced into [name] right ventricle for recording and pacing.  This catheter was then pulled back to [name] His bundle location.  [name] patient presented to [name] electrophysiology lab in sinus rhythm.  His PR interval measured 155 msec with a QRS duration of 100 msec and a QT interval of 488 msec.  His RR interval measured 1210 msec.  His AH interval measured 87 msec with an HV interval of 37 msec.  Ventricular pacing was performed, which revealed midline concentric decremental VA conduction with a VA Wenckebach cycle length of 800 msec.  Rapid atrial pacing was performed, which revealed no evidence of PR greater than RR.  [name] AV Wenckebach cycle length was 450 msec and [name] sinus node recovery time was 3.1 seconds.  Rapid atrial pacing was continued down to a cycle length of 300 msec with atrial flutter not inducible today.  Following rapid atrial pacing down to a cycle length of 300 msec, [name] sinus node recovery time was 3.0 seconds.  Additional rapid atrial pacing was performed down to a cycle length of 250 msec with no arrhythmias induced,  however, this time [name] sinus node recovery time was 4.4 seconds and  when sinus nodal function did not recover at 4.4 seconds, atrial pacing was begun.  I therefore elected to perform empiric cavotricuspid isthmus ablation today as previous EKGs reviewed by me revealed typical atrial flutter.  A 7-French [name] [name] 8 mm ablation catheter was introduced through [name] right common femoral vein and advanced into [name] right atrium.  Atrial mapping of [name] cavotricuspid isthmus was performed, which revealed a standard isthmus. A series of radiofrequency applications were delivered between [name] tricuspid valve annulus and [name] inferior vena cava along [name] usual cavotricuspid isthmus with a target temperature of 60 degrees at 50 watts  for 120 seconds each.  Following [name] fifth radiofrequency application, complete bidirectional isthmus block was achieved as evident by differential atrial pacing from [name] low lateral right atrium with a stimulus to earliest activation recorded bidirectional across [name] isthmus measuring 170 msec.  A Duodecapolar Halo catheter was introduced through [name] right common femoral vein and advanced into [name] right atrium.  This catheter was then positioned around [name] tricuspid valve annulus.  Additional differential atrial pacing was again performed, which again confirmed complete bidirectional cavotricuspid isthmus block.  [name] patient was observed for 20 minutes without return of conduction through [name] isthmus.  He was quite bradycardic with sinus rhythm in [name] 40s and occasional junctional rhythm with deep sedation, however, upon waking of [name] patient's heart rate improved to 60 beats per minute for [name] remainder of [name] procedure.  Following ablation, atrial pacing was performed with no further arrhythmias induced.  [name] AH interval measured 87 msec with an HV interval of 38 msec.  [name] procedure was therefore considered completed.  All catheters were removed and [name] sheaths were aspirated and flushed.  [name] sheaths were removed and hemostasis was assured.  There were no early apparent complications.  CONCLUSIONS: 1. Sinus rhythm upon presentation. 2. Atrial flutter not inducible today. 3. Empiric cavotricuspid isthmus ablation was performed with complete     bidirectional cavotricuspid isthmus block achieved. 4. [name] patient has evidence of sick sinus syndrome with a sinus node     recovery time up to 4.4 seconds observed during [name] procedure     today.  He is presently in a washout phase following recent therapy     with flecainide and diltiazem.  If he continues to have evidence of     sinus node dysfunction with prior syncope, I think that it would be     most prudent to proceed early next week  with a pacemaker implant.     However, if his sinus function completely recovers after these     medicines have washed out, he may be able to avoid pacemaker. 5. No inducible arrhythmias following ablation. 6. No early apparent complications.     Thompson Grayer, MD     JA/MEDQ  D:  11/21/2011  T:  11/22/2011  Job:  [id number]  cc:   Deboraha Sprang, MD, [facility] Laverda Page, MD

## 2011-11-23 LAB — BASIC METABOLIC PANEL
CO2: 28 mEq/L (ref 19–32)
Calcium: 9.3 mg/dL (ref 8.4–10.5)
Creatinine, Ser: 1.19 mg/dL (ref 0.50–1.35)
GFR calc Af Amer: 68 mL/min — ABNORMAL LOW (ref >90)
GFR calc non Af Amer: 59 mL/min — ABNORMAL LOW (ref >90)
Sodium: 140 mEq/L (ref 135–145)

## 2011-11-23 LAB — CBC
MCV: 89.8 fL (ref 78.0–100.0)
Platelets: 168 10*3/uL (ref 150–400)
RBC: 4.41 MIL/uL (ref 4.22–5.81)
RDW: 14.1 % (ref 11.5–15.5)
WBC: 6.9 10*3/uL (ref 4.0–10.5)

## 2011-11-23 NOTE — Progress Notes (Signed)
Patient Name: [name] [name] Date of Encounter: 11/23/2011  Principal Problem:  *Syncope and collapse    SUBJECTIVE: No chest pain or SOB. No palpitations, presyncope or syncope.  OBJECTIVE Filed Vitals:   11/23/11 0340 11/23/11 0400 11/23/11 0651 11/23/11 0814  BP:  129/72 139/84   Pulse:      Temp: 98.2 F (36.8 C)   98.1 F (36.7 C)  TempSrc: Oral   Oral  Resp:  18  18  Height:      Weight:      SpO2:  96%  97%    Intake/Output Summary (Last 24 hours) at 11/23/11 0829 Last data filed at 11/23/11 0400  Gross per 24 hour  Intake    240 ml  Output   1400 ml  Net  -1160 ml   Weight change:  Filed Weights   11/20/11 1130  Weight: 157 lb 3 oz (71.3 kg)    PHYSICAL EXAM General: Well developed, well nourished, male in no acute distress. Head: Normocephalic, atraumatic.  Neck: Supple without bruits, JVD not elevated. Lungs:  Resp regular and unlabored, rales both bases. Heart: RRR, S1, S2, no S3, S4, or murmur. Abdomen: Soft, non-tender, non-distended, BS + x 4.  Extremities: No clubbing, cyanosis, no edema.  Neuro: Alert and oriented X 3. Moves all extremities spontaneously. Psych: Normal affect.  LABS: CBC: Basename 11/23/11 0510  WBC 6.9  NEUTROABS --  HGB 13.5  HCT 39.6  MCV 89.8  PLT [id number]   Basic Metabolic Panel: Basename [id number] 0510 11/21/11 0604  NA 140 140  K 3.8 3.8  CL 101 108  CO2 28 23  GLUCOSE 105* 102*  BUN 15 17  CREATININE 1.19 1.19  CALCIUM 9.3 8.5  MG -- --  PHOS -- --   Hemoglobin A1C: Basename 11/21/11 0939  HGBA1C 6.2*   TELE:  SR, S brady with HR never sustained < 50. ?sinus arrhythmia, rare PVCs.     Radiology/Studies: Ct Abdomen Pelvis Wo Contrast 11/20/2011  *RADIOLOGY REPORT*  Clinical Data: Left side and back pain after fall.  CT ABDOMEN AND PELVIS WITHOUT CONTRAST  Technique:  Multidetector CT imaging of the abdomen and pelvis was performed following the standard protocol without intravenous contrast.  Comparison:  10/16/2010  Findings: Atelectasis or infiltration in the lung bases.  Small esophageal hiatal hernia.  The unenhanced appearance of the liver, spleen, gallbladder, pancreas, adrenal glands, kidneys, abdominal aorta, and retroperitoneal lymph nodes is unremarkable.  The stomach, small bowel, and colon are not abnormally dilated.  No free air or free fluid in the abdomen.  Pelvis:  Metallic seed implants in the prostate gland.  Diffuse bladder wall thickening which could represent hypertrophy or cystitis.  No free or loculated pelvic fluid collections.  No significant lymphadenopathy in the pelvis.  Diverticulosis of the sigmoid colon without diverticulitis.  The appendix is normal. Degenerative changes in the lumbar spine.  Normal alignment of the lumbar vertebrae.  No focal bone lesions identified.  Abdominal wall musculature appears intact.  Small focal area of infiltration in the subcutaneous fat over the left flank region, associated with an acute fracture of the left eleventh rib with mild displacement.  IMPRESSION: Left 11th rib fracture with associated subcutaneous soft tissue hematoma.  No other acute abnormalities identified.  Original Report Authenticated By: Neale Burly, M.D.    Dg Chest Port 1 View 11/20/2011  *RADIOLOGY REPORT*  Clinical Data: Shortness of breath.  Dizziness.  Left lower posterior rib pain.  PORTABLE CHEST -  1 VIEW  Comparison: 10/01/2007  Findings: Shallow inspiration.  Normal heart size and pulmonary vascularity for technique.  No focal airspace consolidation or edema in the lungs.  No blunting of costophrenic angles.  No pneumothorax.  No significant changes since previous study, allowing for changes in technique.  IMPRESSION: No evidence of active pulmonary disease.  Original Report Authenticated By: Neale Burly, M.D.    Current Medications:    . amLODipine  10 mg Oral Daily  . aspirin  81 mg Oral Daily  . heparin subcutaneous  5,000 Units Subcutaneous Q8H  .  hydrALAZINE  25 mg Oral Q8H  . irbesartan  300 mg Oral Daily   And  . hydrochlorothiazide  12.5 mg Oral Daily  . simvastatin  20 mg Oral q1800  . sodium chloride  3 mL Intravenous Q12H  . Tamsulosin HCl  0.4 mg Oral BID  . DISCONTD: hydrALAZINE  10 mg Intravenous Q6H      ASSESSMENT AND PLAN: Principal Problem:  *Syncope and collapse ASSESSMENT AND PLAN:  76 yo with history of syncope/falls found to have tachy-brady syndrome with atrial flutter and sinus bradycardia.   1. Atrial flutter: s/p ablation yesterday. He is in NSR and was in NSR at the time of ablation. He has not been on anticoagulation due to falls, concern for head injury. He was put him on ASA - no problems with it.   2. Bradycardia: Symptomatic bradycardia with syncope/falls. He has been stable on telemetry since stopping nodal blockers with only mild bradycardia. Will observe today, potential home tomorrow if no bradycardic events. Would arrange for event monitor at discharge.  MD advise on keeping till am so Dr Einar Gip can make final plans.   3. HTN: BP better. Changed hydralazine to po yesterday, no doses held on any Rx.   Signed, Rosaria Ferries , PA-C 8:29 AM 11/23/2011  Patient seen with PA, agree with the above note.  No telemetry events.  Will have him ambulate today, likely home in am (Dr. Nadyne Coombes returns tomorrow and will make final plans).   Loralie Champagne 11/23/2011 10:28 AM

## 2011-11-24 LAB — GLUCOSE, CAPILLARY
Glucose-Capillary: 111 mg/dL — ABNORMAL HIGH (ref 70–99)
Glucose-Capillary: 91 mg/dL (ref 70–99)

## 2011-11-24 MED ORDER — HYDRALAZINE HCL 25 MG PO TABS: 25.0000 mg | ORAL_TABLET | Freq: Three times a day (TID) | ORAL | Status: DC

## 2011-11-24 MED ORDER — AMLODIPINE BESYLATE 10 MG PO TABS: 10.0000 mg | ORAL_TABLET | Freq: Every day | ORAL | Status: DC

## 2011-11-24 MED ORDER — HYDROCODONE-ACETAMINOPHEN 5-325 MG PO TABS: 1.0000 | ORAL_TABLET | ORAL | Status: DC | PRN

## 2011-11-24 MED ORDER — ASPIRIN 81 MG PO CHEW: 81.0000 mg | CHEWABLE_TABLET | Freq: Every day | ORAL | Status: DC

## 2011-11-24 NOTE — Progress Notes (Signed)
Subjective:  Doing well and no bradycardic episodes on telemetry last 48 hours. Remains bradycardic.   Objective:  Vital Signs in the last 24 hours: Temp:  [98.1 F (36.7 C)-98.8 F (37.1 C)] 98.7 F (37.1 C) (07/22 0500) Pulse Rate:  [52-70] 52  (07/22 0500) Resp:  [16-20] 18  (07/22 0500) BP: (110-159)/(61-98) 110/61 mmHg (07/22 0500) SpO2:  [96 %-97 %] 97 % (07/22 0500) Weight:  [71.3 kg (157 lb 3 oz)] 71.3 kg (157 lb 3 oz) (07/21 1500)  Intake/Output from previous day: 07/21 0701 - 07/22 0700 In: 370 [P.O.:370] Out: 425 [Urine:425]  Physical Exam:   General appearance: alert, appears stated age and no distress Eyes: conjunctivae/corneas clear. PERRL, EOM's intact. Fundi benign. Neck: no adenopathy, no carotid bruit, no JVD, supple, symmetrical, trachea midline and thyroid not enlarged, symmetric, no tenderness/mass/nodules Neck: JVP - normal, carotids 2+= without bruits Resp: clear to auscultation bilaterally Chest wall: no tenderness Cardio: regular rate and rhythm, S1, S2 normal, no murmur, click, rub or gallop GI: soft, non-tender; bowel sounds normal; no masses,  no organomegaly Extremities: extremities normal, atraumatic, no cyanosis or edema    Lab Results:  Basename 11/23/11 0510  WBC 6.9  HGB 13.5  PLT 168    Basename 11/23/11 0510  NA 140  K 3.8  CL 101  CO2 28  GLUCOSE 105*  BUN 15  CREATININE 1.19   Cardiac Studies: Tele NSR, S. Bradycardia.      Marland Kitchen amLODipine  10 mg Oral Daily  . aspirin  81 mg Oral Daily  . heparin subcutaneous  5,000 Units Subcutaneous Q8H  . hydrALAZINE  25 mg Oral Q8H  . irbesartan  300 mg Oral Daily   And  . hydrochlorothiazide  12.5 mg Oral Daily  . simvastatin  20 mg Oral q1800  . sodium chloride  3 mL Intravenous Q12H  . Tamsulosin HCl  0.4 mg Oral BID     Assessment/Plan:  A. Flutter S/P ablation maintains sinus 2. Syncope due to Cardizem and flecainide. Also has underlying mild conduction system  disease. 3. Hyperglycemia 4. Hypertension.  Plan: I will leave it to Dr. Rayann Heman to evaluate possible discharge and I will see him back in the office in 2 weeks. He may need pacemaker eventually. Will continue to watch him as OP and may consider routine treadmill stress to evaluate chronotropic competence.   Laverda Page, M.D. 11/24/2011, 7:54 AM

## 2011-11-24 NOTE — Progress Notes (Signed)
     Patient: [name] [name] Date of Encounter: 11/24/2011, 8:30 AM Admit date: 11/20/2011     Subjective  Mr. [name] [name] has no complaints this AM and states he is feeling well. He denies chest pain, SOB or palpitations. He is eager to go home.   Objective  Physical Exam: Vitals: BP 110/61  Pulse 52  Temp 98.7 F (37.1 C) (Oral)  Resp 18  Ht 5\' 7"  (1.702 m)  Wt 157 lb 3 oz (71.3 kg)  BMI 24.62 kg/m2  SpO2 97% General: Well developed, well appearing, in no acute distress. Neck: Supple. JVD not elevated. Lungs: Clear bilaterally to auscultation without wheezes, rales, or rhonchi. Breathing is unlabored. Heart: RRR S1 S2 without murmurs, rubs, or gallops.  Abdomen: Soft, non-distended. Extremities: No clubbing or cyanosis. No edema.  Distal pedal pulses are 2+ and equal bilaterally. Neuro: Alert and oriented X 3. Moves all extremities spontaneously. No focal deficits.  Intake/Output: Intake/Output Summary (Last 24 hours) at 11/24/11 0830 Last data filed at 11/24/11 0500  Gross per 24 hour  Intake    120 ml  Output    225 ml  Net   -105 ml   Inpatient Medications:  . amLODipine  10 mg Oral Daily  . aspirin  81 mg Oral Daily  . heparin subcutaneous  5,000 Units Subcutaneous Q8H  . hydrALAZINE  25 mg Oral Q8H  . irbesartan  300 mg Oral Daily  . hydrochlorothiazide  12.5 mg Oral Daily  . simvastatin  20 mg Oral q1800  . sodium chloride  3 mL Intravenous Q12H  . Tamsulosin HCl  0.4 mg Oral BID   Labs:  [facility] 11/23/11 0510  NA 140  K 3.8  CL 101  CO2 28  GLUCOSE 105*  BUN 15  CREATININE 1.19  CALCIUM 9.3  MG --  PHOS --    Basename 11/23/11 0510  WBC 6.9  NEUTROABS --  HGB 13.5  HCT 39.6  MCV 89.8  PLT 168    Radiology/Studies: None in >48 hours  Telemetry: normal sinus rhythm   Assessment and Plan  76 yo with history of syncope/falls found to have tachy-brady syndrome with atrial flutter and sinus bradycardia.   1. Atrial flutter: s/p ablation on  Friday. He remains in NSR. He has not been on anticoagulation due to falls, concern for head injury. He was put him on ASA.  2. Bradycardia: Symptomatic bradycardia with syncope/falls. He has been stable on telemetry since stopping AV nodal blockers with only mild sinus bradycardia. No significant bradycardia, AV block or pause. Probably ok to discharge from EP standpoint. Would arrange for event monitor at discharge.   3. HTN: BP better. Per primary cardiologist, Dr. Einar Gip.  Dr. Caryl Comes to see and make further recommendations as needed. Signed, Ileene Hutchinson PA-C  Will discharge today with 30 day recorder  Will followup with Dr Lavone Nian and we will see as needed  Virl Axe, MD 11/24/2011 9:55 AM

## 2011-11-24 NOTE — Discharge Summary (Addendum)
Physician Discharge Summary  Patient ID: [name] [name] MRN: [id number] DOB/AGE: 74-Apr-1937 74 y.o.  Admit date: 11/20/2011 Discharge date: 11/24/2011  Primary Discharge Diagnosis syncope secondary to ventricular standstill, conduction system disease and heart block. Atrial and ventricular asystole.  Secondary Discharge Diagnosis hypertension, hyperlipidemia. Hyperglycemia without diagnosis of diabetes mellitus with an abnormal HbA1c Left 11th rib fracture do to syncope and fall  Significant Diagnostic Studies: Atrial flutter ablation on 11/21/2011 by Dr. Thompson Grayer  Consults: EP consult Dr. Rojelio Brenner Course: Patient was admitted to the hospital with an episode of syncope. He was found to have ventricular standstill and episodes of atrial and also ventricular asystole lasting for 4 seconds. His syncope was felt due to 2 heart block and conduction system disease. Patient previously was on flecainide and also on Cardizem for atrial flutter and hypertension. These were discontinued. It was felt he would benefit from atrial flutter ablation in the hopes of stopping use of flecainide and Cardizem. Plan was to watch him for 48 hours post procedure to see if he needs any pacemaker implantation as he did have significant bradycardia after the atrial flutter ablation. Patient did well and had no episodes of atrial flutter or heart block. After watching for 48 hours it is felt safe for discharge. Recommendation: He will need 30 days of event monitor to evaluate for any heart block. If he indeed shows evidence of high degree heart block he' 'll benefit from implantation of a permanent pacemaker. Case was discussed with Dr. Virl Axe. Unless further arrhythmia or heart block I will continue to manage the patient.   Discharge Exam: Blood pressure 110/61, pulse 52, temperature 98.7 F (37.1 C), temperature source Oral, resp. rate 18, height 5\' 7"  (1.702 m), weight 71.3 kg (157 lb 3 oz), SpO2  97.00%.   General appearance: alert, cooperative, appears stated age and no distress  Eyes: conjunctivae/corneas clear. PERRL, EOM's intact. Fundi benign.  Neck: no adenopathy, no carotid bruit, no JVD, supple, symmetrical, trachea midline and thyroid not enlarged, symmetric, no tenderness/mass/nodules  Neck: JVP - normal, carotids 2+= without bruits  Resp: clear to auscultation bilaterally  Chest wall: left sided chest wall tenderness  Cardio: regular rate and rhythm, S1, S2 normal, no murmur, click, rub or gallop  GI: soft, non-tender; bowel sounds normal; no masses, no organomegaly  Extremities: extremities normal, atraumatic, no cyanosis or edema  Pulses: 2+ and symmetric  Skin: Skin color, texture, turgor normal. No rashes or lesions  Neurologic: Alert and oriented X 3, normal strength and tone. Normal symmetric reflexes. Normal coordination and gait Right groin without hematoma,.  Labs:   Lab Results  Component Value Date   WBC 6.9 11/23/2011   HGB 13.5 11/23/2011   HCT 39.6 11/23/2011   MCV 89.8 11/23/2011   PLT 168 11/23/2011    Lab 11/23/11 0510 11/20/11 0430  NA 140 --  K 3.8 --  CL 101 --  CO2 28 --  BUN 15 --  CREATININE 1.19 --  CALCIUM 9.3 --  PROT -- 6.9  BILITOT -- 0.3  ALKPHOS -- 67  ALT -- 16  AST -- 24  GLUCOSE 105* --      Results for [name] [name], [name] [name] (MRN [id number]) as of 11/24/2011 09:31  Ref. Range 11/21/2011 09:39 11/22/2011 07:14 11/23/2011 05:10 11/23/2011 21:08 11/24/2011 08:37  Hemoglobin A1C Latest Range: <5.7 % 6.2 (H)        Radiology: Ct Abdomen Pelvis Wo Contrast  11/20/2011  *RADIOLOGY REPORT*  Clinical Data:  Left side and back pain after fall.  CT ABDOMEN AND PELVIS WITHOUT CONTRAST  Technique:  Multidetector CT imaging of the abdomen and pelvis was performed following the standard protocol without intravenous contrast.  Comparison: 10/16/2010  Findings: Atelectasis or infiltration in the lung bases.  Small esophageal hiatal hernia.  The unenhanced  appearance of the liver, spleen, gallbladder, pancreas, adrenal glands, kidneys, abdominal aorta, and retroperitoneal lymph nodes is unremarkable.  The stomach, small bowel, and colon are not abnormally dilated.  No free air or free fluid in the abdomen.  Pelvis:  Metallic seed implants in the prostate gland.  Diffuse bladder wall thickening which could represent hypertrophy or cystitis.  No free or loculated pelvic fluid collections.  No significant lymphadenopathy in the pelvis.  Diverticulosis of the sigmoid colon without diverticulitis.  The appendix is normal. Degenerative changes in the lumbar spine.  Normal alignment of the lumbar vertebrae.  No focal bone lesions identified.  Abdominal wall musculature appears intact.  Small focal area of infiltration in the subcutaneous fat over the left flank region, associated with an acute fracture of the left eleventh rib with mild displacement.  IMPRESSION: Left 11th rib fracture with associated subcutaneous soft tissue hematoma.  No other acute abnormalities identified.  Original Report Authenticated By: Neale Burly, M.D.   Ct Head Wo Contrast  11/20/2011  *RADIOLOGY REPORT*  Clinical Data: Dizziness with subsequent fall.  CT HEAD WITHOUT CONTRAST  Technique:  Contiguous axial images were obtained from the base of the skull through the vertex without contrast.  Comparison: None.  Findings: Diffuse cerebral atrophy.  No mass effect or midline shift.  No abnormal extra-axial fluid collections.  Gray-white matter junctions are distinct.  Basal cisterns are not effaced. Visualized paranasal sinuses and mastoid air cells are not opacified.  No depressed skull fractures.  IMPRESSION: No acute intracranial abnormalities.  Original Report Authenticated By: Neale Burly, M.D.   Dg Chest Port 1 View  11/20/2011  *RADIOLOGY REPORT*  Clinical Data: Shortness of breath.  Dizziness.  Left lower posterior rib pain.  PORTABLE CHEST - 1 VIEW  Comparison: 10/01/2007   Findings: Shallow inspiration.  Normal heart size and pulmonary vascularity for technique.  No focal airspace consolidation or edema in the lungs.  No blunting of costophrenic angles.  No pneumothorax.  No significant changes since previous study, allowing for changes in technique.  IMPRESSION: No evidence of active pulmonary disease.  Original Report Authenticated By: Neale Burly, M.D.    EKG: Initial presenting EKG, stated sinus bradycardia, normal QRS and QT interval. No evidence of ischemia.  FOLLOW UP PLANS AND APPOINTMENTS  Medication List  As of 11/24/2011  9:24 AM   ASK your doctor about these medications         diltiazem 180 MG 24 hr capsule   Commonly known as: CARDIZEM CD   Take 180 mg by mouth daily.      flecainide 50 MG tablet   Commonly known as: TAMBOCOR   Take 50 mg by mouth 2 (two) times daily.      olmesartan-hydrochlorothiazide 40-12.5 MG per tablet   Commonly known as: BENICAR HCT   Take 1 tablet by mouth daily.      simvastatin 20 MG tablet   Commonly known as: ZOCOR   Take 20 mg by mouth every evening.      Tamsulosin HCl 0.4 MG Caps   Commonly known as: FLOMAX   Take 0.4 mg by mouth 2 (two) times daily.  Follow-up Information    Follow up with Laverda Page, MD in 2 weeks.   Contact information:   [address] [phone]       Follow up with Laverda Page, MD. Call in 1 day. (For 30 day event monitoring hook up.)    Contact information:   [address] [place], MD 11/24/2011, 9:24 AM

## 2011-11-25 DIAGNOSIS — I4892 Unspecified atrial flutter: Secondary | ICD-10-CM | POA: Diagnosis not present

## 2011-11-25 DIAGNOSIS — R0602 Shortness of breath: Secondary | ICD-10-CM | POA: Diagnosis not present

## 2011-11-25 NOTE — Anesthesia Postprocedure Evaluation (Signed)
Anesthesia Post Note  Patient: [name] [name]  Procedure(s) Performed: Procedure(s) (LRB): ATRIAL FLUTTER ABLATION (N/A)  Anesthesia type: General  Patient location: PACU  Post pain: Pain level controlled and Adequate analgesia  Post assessment: Post-op Vital signs reviewed, Patient's Cardiovascular Status Stable, Respiratory Function Stable, Patent Airway and Pain level controlled  Last Vitals:  Filed Vitals:   11/24/11 0500  BP: 110/61  Pulse: 52  Temp: 37.1 C  Resp: 18    Post vital signs: Reviewed and stable  Level of consciousness: awake, alert  and oriented  Complications: No apparent anesthesia complications

## 2011-11-26 ENCOUNTER — Encounter (HOSPITAL_COMMUNITY): Payer: Self-pay | Admitting: Pharmacy Technician

## 2011-11-26 ENCOUNTER — Telehealth: Payer: Self-pay | Admitting: *Deleted

## 2011-11-26 ENCOUNTER — Other Ambulatory Visit: Payer: Self-pay | Admitting: *Deleted

## 2011-11-26 ENCOUNTER — Emergency Department (INDEPENDENT_AMBULATORY_CARE_PROVIDER_SITE_OTHER)
Admission: EM | Admit: 2011-11-26 | Discharge: 2011-11-26 | Disposition: A | Discharge status: Home / Self Care | Payer: Medicare Other | Source: Home / Self Care | Attending: Emergency Medicine | Admitting: Emergency Medicine

## 2011-11-26 ENCOUNTER — Encounter (HOSPITAL_COMMUNITY): Payer: Self-pay

## 2011-11-26 DIAGNOSIS — S2239XA Fracture of one rib, unspecified side, initial encounter for closed fracture: Secondary | ICD-10-CM | POA: Diagnosis not present

## 2011-11-26 DIAGNOSIS — K5900 Constipation, unspecified: Secondary | ICD-10-CM | POA: Diagnosis not present

## 2011-11-26 DIAGNOSIS — I495 Sick sinus syndrome: Secondary | ICD-10-CM

## 2011-11-26 MED ORDER — LACTULOSE 10 GM/15ML PO SOLN: 10.0000 g | Freq: Two times a day (BID) | ORAL | Status: AC

## 2011-11-26 NOTE — ED Provider Notes (Signed)
History     CSN: [id number]  Arrival date & time 11/26/11  1511   First MD Initiated Contact with Patient 11/26/11 1534      Chief Complaint  Patient presents with  . Rib Injury    (Consider location/radiation/quality/duration/timing/severity/associated sxs/prior treatment) HPI Comments: Mr. Barrilleaux, presents urgent care complaining of pain on the left posterior aspect of his back, he goes into describing that he has a rib fracture there was diagnosed recently from his recent fall and has been sore for several days, he feels as he is constipated and is taking Metamucil and another over-the-counter laxative as instructed by his Dr. Cyndie Mull that when he's straining while defecating makes the pain much worse and today he did have an incident in which he started coughing and he fell a pop on the left side of his back, where or near the fracture is. Patient denies any chest pain, or shortness of breath. It feels sore. Patient denies any fevers, or any other new symptoms. He called his doctor's office today and they told him to come here. He had a bowel movement today it was very hard and he had to strain and that made him hurt even more.(He describes)  The history is provided by the patient and a relative.    Past Medical History  Diagnosis Date  . Prostate cancer   . Atrial flutter   . Hypertension 04/30/11    Cardioversion 06/16/11  . Hyperlipemia     Past Surgical History  Procedure Date  . Cardioversion 06/16/2011    Procedure: CARDIOVERSION;  Surgeon: Laverda Page, MD;  Location: [place];  Service: Cardiovascular;  Laterality: N/A;  . Tonsillectomy   . Radioactive seed implant   . Cardiac electrophysiology study and ablation     No family history on file.  History  Substance Use Topics  . Smoking status: Former Smoker -- 52 years    Quit date: 07/20/2009  . Smokeless tobacco: Never Used  . Alcohol Use: No      Review of Systems  Constitutional: Positive for activity  change. Negative for fever, diaphoresis, appetite change and fatigue.  Respiratory: Positive for cough. Negative for shortness of breath, wheezing and stridor.   Cardiovascular: Negative for chest pain, palpitations and leg swelling.  Skin: Negative for color change, pallor, rash and wound.    Allergies  Review of patient's allergies indicates no known allergies.  Home Medications   Current Outpatient Rx  Name Route Sig Dispense Refill  . AMLODIPINE BESYLATE 10 MG PO TABS Oral Take 1 tablet (10 mg total) by mouth daily. 90 tablet 1  . ASPIRIN 81 MG PO CHEW Oral Chew 1 tablet (81 mg total) by mouth daily.    Marland Kitchen HYDRALAZINE HCL 25 MG PO TABS Oral Take 1 tablet (25 mg total) by mouth every 8 (eight) hours. 90 tablet 6  . HYDROCODONE-ACETAMINOPHEN 5-325 MG PO TABS Oral Take 1 tablet by mouth every 6 (six) hours as needed.    Marland Kitchen OLMESARTAN MEDOXOMIL-HCTZ 40-12.5 MG PO TABS Oral Take 1 tablet by mouth daily.    Marland Kitchen SIMVASTATIN 20 MG PO TABS Oral Take 20 mg by mouth every evening.    Marland Kitchen TAMSULOSIN HCL 0.4 MG PO CAPS Oral Take 0.4 mg by mouth 2 (two) times daily.     Marland Kitchen LACTULOSE 10 GM/15ML PO SOLN Oral Take 15 mLs (10 g total) by mouth 2 (two) times daily. 120 mL 0    BP 142/82  Pulse 55  Temp  98.6 F (37 C) (Oral)  Resp 16  SpO2 100%  Physical Exam  Nursing note and vitals reviewed. Constitutional: He appears well-nourished. No distress.  Pulmonary/Chest: Effort normal and breath sounds normal. No respiratory distress. He has no wheezes. He has no rales.   He exhibits no tenderness.  Musculoskeletal: He exhibits tenderness.  Neurological: He is alert.  Skin: Skin is warm. No rash noted.    ED Course  Procedures (including critical care time)  Labs Reviewed - No data to display No results found.   1. Closed rib fracture   2. Constipation       MDM  Patient with a recent sustained 11 rib fracture. Nondisplaced nature. And also expressing moderate to severe constipation as he  describes. Patient was already prescribed narcotics from his primary care Dr. which he has refused to take as he knows this will make him even more constipated despite the fact that his doctor told him to take Metamucil and another oral laxative. Patient describes movements, cough and strain and ST precipitator factors for this pain on his left upper back consistent bradycardia sustained this recent fracture. I have discussed the patient that if any changes in his symptoms such as chest pains, shortness of breath or worsening pain that he should go to the emergency department for further evaluation. His exam and symptoms were consistent with ongoing pain from a recently sustained left rib fracture        Rosana Hoes, MD 11/26/11 1851

## 2011-11-26 NOTE — ED Notes (Signed)
Pt states he has fractured ribs on lt post-lateral chest from fall on 11-21-11 due to syncope.  States today he was straining to have BM and felt something pop in lt posterolateral area, states he made him SOB and was very painful.  Reports the SOB resolved and the pain is somewhat better.  Pt has heart monitor on- scheduled for pacemaker 11-05-11.  Denies chest pain or other sx.

## 2011-11-26 NOTE — Telephone Encounter (Signed)
Dr Einar Gip discussed patient with Dr Rayann Heman.  He has been wearing an event monitor since discharge and and had a 4 second pause last night.  With persistent conduction disease, it was recommended that the patient undergo pacemaker implantation.  I spoke with patient and explained the above.  I offered Thursday, 7-24 as an option for device implantation but the patient's daughter is his transportation and she is unable to bring him until next week.  Pacemaker implant scheduled for Tuesday, 12-02-2011 at [place] with Dr Rayann Heman.  The patient will arrive at [facility] to short stay.  Instructions reviewed with patient.    Patient advised to go to ER with symptoms of syncope or pre-syncope.  Patient advised to continue wearing event monitor.  Patient aware and agrees with above plan.

## 2011-11-28 ENCOUNTER — Encounter (HOSPITAL_COMMUNITY)
Admission: RE | Disposition: A | Discharge status: Home / Self Care | Payer: Self-pay | Source: Ambulatory Visit | Attending: Internal Medicine

## 2011-11-28 ENCOUNTER — Encounter (HOSPITAL_COMMUNITY): Payer: Self-pay

## 2011-11-28 ENCOUNTER — Ambulatory Visit (HOSPITAL_COMMUNITY)
Admission: RE | Admit: 2011-11-28 | Discharge: 2011-11-29 | Disposition: A | Discharge status: Home / Self Care | Payer: Medicare Other | Source: Ambulatory Visit | Attending: Internal Medicine | Admitting: Internal Medicine

## 2011-11-28 DIAGNOSIS — R55 Syncope and collapse: Secondary | ICD-10-CM | POA: Diagnosis not present

## 2011-11-28 DIAGNOSIS — I495 Sick sinus syndrome: Secondary | ICD-10-CM

## 2011-11-28 DIAGNOSIS — I10 Essential (primary) hypertension: Secondary | ICD-10-CM | POA: Diagnosis not present

## 2011-11-28 DIAGNOSIS — Z23 Encounter for immunization: Secondary | ICD-10-CM | POA: Diagnosis not present

## 2011-11-28 DIAGNOSIS — I4892 Unspecified atrial flutter: Secondary | ICD-10-CM | POA: Insufficient documentation

## 2011-11-28 HISTORY — PX: PERMANENT PACEMAKER INSERTION: SHX5480

## 2011-11-28 HISTORY — DX: Presence of cardiac pacemaker: Z95.0

## 2011-11-28 HISTORY — DX: Sick sinus syndrome: I49.5

## 2011-11-28 HISTORY — PX: PACEMAKER INSERTION: SHX728

## 2011-11-28 LAB — SURGICAL PCR SCREEN
MRSA, PCR: NEGATIVE
Staphylococcus aureus: NEGATIVE

## 2011-11-28 SURGERY — PERMANENT PACEMAKER INSERTION
Anesthesia: LOCAL

## 2011-11-28 MED ORDER — IRBESARTAN 300 MG PO TABS
300.0000 mg | ORAL_TABLET | Freq: Every day | ORAL | Status: DC
  Administered 2011-11-29: 300 mg via ORAL
  Filled 2011-11-28: qty 1

## 2011-11-28 MED ORDER — CEFAZOLIN SODIUM-DEXTROSE 2-3 GM-% IV SOLR
2.0000 g | INTRAVENOUS | Status: DC
  Filled 2011-11-28: qty 50

## 2011-11-28 MED ORDER — SODIUM CHLORIDE 0.9 % IJ SOLN: 3.0000 mL | Freq: Two times a day (BID) | INTRAMUSCULAR | Status: DC

## 2011-11-28 MED ORDER — SODIUM CHLORIDE 0.9 % IV SOLN: 250.0000 mL | INTRAVENOUS | Status: DC

## 2011-11-28 MED ORDER — ASPIRIN 81 MG PO CHEW
81.0000 mg | CHEWABLE_TABLET | Freq: Every day | ORAL | Status: DC
  Administered 2011-11-29: 09:00:00 81 mg via ORAL
  Filled 2011-11-28: qty 1

## 2011-11-28 MED ORDER — CHLORHEXIDINE GLUCONATE 4 % EX LIQD
60.0000 mL | Freq: Once | CUTANEOUS | Status: DC
  Filled 2011-11-28: qty 60

## 2011-11-28 MED ORDER — SODIUM CHLORIDE 0.9 % IJ SOLN: 3.0000 mL | INTRAMUSCULAR | Status: DC | PRN

## 2011-11-28 MED ORDER — LIDOCAINE HCL (PF) 1 % IJ SOLN
INTRAMUSCULAR | Status: AC
  Filled 2011-11-28: qty 60

## 2011-11-28 MED ORDER — HYDROCODONE-ACETAMINOPHEN 5-325 MG PO TABS
1.0000 | ORAL_TABLET | ORAL | Status: DC | PRN
  Administered 2011-11-29: 03:00:00 2 via ORAL
  Filled 2011-11-28: qty 2

## 2011-11-28 MED ORDER — AMLODIPINE BESYLATE 10 MG PO TABS
10.0000 mg | ORAL_TABLET | Freq: Every day | ORAL | Status: DC
  Administered 2011-11-29: 10 mg via ORAL
  Filled 2011-11-28: qty 1

## 2011-11-28 MED ORDER — PNEUMOCOCCAL VAC POLYVALENT 25 MCG/0.5ML IJ INJ
0.5000 mL | INJECTION | INTRAMUSCULAR | Status: AC
  Administered 2011-11-29: 09:00:00 0.5 mL via INTRAMUSCULAR
  Filled 2011-11-28: qty 0.5

## 2011-11-28 MED ORDER — MUPIROCIN 2 % EX OINT
TOPICAL_OINTMENT | Freq: Two times a day (BID) | CUTANEOUS | Status: DC
  Administered 2011-11-28: 1 via NASAL
  Filled 2011-11-28: qty 22

## 2011-11-28 MED ORDER — OLMESARTAN MEDOXOMIL-HCTZ 40-12.5 MG PO TABS: 1.0000 | ORAL_TABLET | Freq: Every day | ORAL | Status: DC

## 2011-11-28 MED ORDER — SIMVASTATIN 20 MG PO TABS
20.0000 mg | ORAL_TABLET | Freq: Every day | ORAL | Status: DC
  Filled 2011-11-28: qty 1

## 2011-11-28 MED ORDER — FENTANYL CITRATE 0.05 MG/ML IJ SOLN
INTRAMUSCULAR | Status: AC
  Filled 2011-11-28: qty 2

## 2011-11-28 MED ORDER — CEFAZOLIN SODIUM 1-5 GM-% IV SOLN
1.0000 g | Freq: Four times a day (QID) | INTRAVENOUS | Status: DC
  Filled 2011-11-28 (×3): qty 50

## 2011-11-28 MED ORDER — ACETAMINOPHEN 325 MG PO TABS: 325.0000 mg | ORAL_TABLET | ORAL | Status: DC | PRN

## 2011-11-28 MED ORDER — HYDRALAZINE HCL 25 MG PO TABS
25.0000 mg | ORAL_TABLET | Freq: Three times a day (TID) | ORAL | Status: DC
  Administered 2011-11-28 – 2011-11-29 (×2): 25 mg via ORAL
  Filled 2011-11-28 (×6): qty 1

## 2011-11-28 MED ORDER — ONDANSETRON HCL 4 MG/2ML IJ SOLN: 4.0000 mg | Freq: Four times a day (QID) | INTRAMUSCULAR | Status: DC | PRN

## 2011-11-28 MED ORDER — HEPARIN (PORCINE) IN NACL 2-0.9 UNIT/ML-% IJ SOLN
INTRAMUSCULAR | Status: AC
  Filled 2011-11-28: qty 1000

## 2011-11-28 MED ORDER — HYDROCHLOROTHIAZIDE 12.5 MG PO CAPS
12.5000 mg | ORAL_CAPSULE | Freq: Every day | ORAL | Status: DC
  Administered 2011-11-29: 12.5 mg via ORAL
  Filled 2011-11-28: qty 1

## 2011-11-28 MED ORDER — SODIUM CHLORIDE 0.45 % IV SOLN
INTRAVENOUS | Status: DC
  Administered 2011-11-28: 12:00:00 via INTRAVENOUS

## 2011-11-28 MED ORDER — SODIUM CHLORIDE 0.9 % IV SOLN: 250.0000 mL | INTRAVENOUS | Status: DC | PRN

## 2011-11-28 MED ORDER — CEFAZOLIN SODIUM-DEXTROSE 2-3 GM-% IV SOLR
INTRAVENOUS | Status: AC
  Filled 2011-11-28: qty 50

## 2011-11-28 MED ORDER — SODIUM CHLORIDE 0.9 % IR SOLN
80.0000 mg | Status: DC
  Filled 2011-11-28: qty 2

## 2011-11-28 MED ORDER — MIDAZOLAM HCL 5 MG/5ML IJ SOLN
INTRAMUSCULAR | Status: AC
  Filled 2011-11-28: qty 5

## 2011-11-28 MED ORDER — MUPIROCIN 2 % EX OINT
TOPICAL_OINTMENT | CUTANEOUS | Status: AC
  Administered 2011-11-28: 1 via NASAL
  Filled 2011-11-28: qty 22

## 2011-11-28 MED ORDER — TAMSULOSIN HCL 0.4 MG PO CAPS
0.4000 mg | ORAL_CAPSULE | Freq: Two times a day (BID) | ORAL | Status: DC
  Administered 2011-11-29: 0.4 mg via ORAL
  Filled 2011-11-28 (×3): qty 1

## 2011-11-28 MED ORDER — CEFAZOLIN SODIUM 1-5 GM-% IV SOLN
1.0000 g | Freq: Four times a day (QID) | INTRAVENOUS | Status: AC
  Administered 2011-11-28 – 2011-11-29 (×3): 1 g via INTRAVENOUS
  Filled 2011-11-28 (×3): qty 50

## 2011-11-28 NOTE — H&P (View-Only) (Signed)
     Patient: [name] [name] Date of Encounter: 11/24/2011, 8:30 AM Admit date: 11/20/2011     Subjective  [name] has no complaints this AM and states he is feeling well. He denies chest pain, SOB or palpitations. He is eager to go home.   Objective  Physical Exam: Vitals: BP 110/61  Pulse 52  Temp 98.7 F (37.1 C) (Oral)  Resp 18  Ht 5\' 7"  (1.702 m)  Wt 157 lb 3 oz (71.3 kg)  BMI 24.62 kg/m2  SpO2 97% General: Well developed, well appearing, in no acute distress. Neck: Supple. JVD not elevated. Lungs: Clear bilaterally to auscultation without wheezes, rales, or rhonchi. Breathing is unlabored. Heart: RRR S1 S2 without murmurs, rubs, or gallops.  Abdomen: Soft, non-distended. Extremities: No clubbing or cyanosis. No edema.  Distal pedal pulses are 2+ and equal bilaterally. Neuro: Alert and oriented X 3. Moves all extremities spontaneously. No focal deficits.  Intake/Output: Intake/Output Summary (Last 24 hours) at 11/24/11 0830 Last data filed at 11/24/11 0500  Gross per 24 hour  Intake    120 ml  Output    225 ml  Net   -105 ml   Inpatient Medications:  . amLODipine  10 mg Oral Daily  . aspirin  81 mg Oral Daily  . heparin subcutaneous  5,000 Units Subcutaneous Q8H  . hydrALAZINE  25 mg Oral Q8H  . irbesartan  300 mg Oral Daily  . hydrochlorothiazide  12.5 mg Oral Daily  . simvastatin  20 mg Oral q1800  . sodium chloride  3 mL Intravenous Q12H  . Tamsulosin HCl  0.4 mg Oral BID   Labs:  [facility] 11/23/11 0510  NA 140  K 3.8  CL 101  CO2 28  GLUCOSE 105*  BUN 15  CREATININE 1.19  CALCIUM 9.3  MG --  PHOS --    Basename 11/23/11 0510  WBC 6.9  NEUTROABS --  HGB 13.5  HCT 39.6  MCV 89.8  PLT 168    Radiology/Studies: None in >48 hours  Telemetry: normal sinus rhythm   Assessment and Plan  76 yo with history of syncope/falls found to have tachy-brady syndrome with atrial flutter and sinus bradycardia.   1. Atrial flutter: s/p ablation on  Friday. He remains in NSR. He has not been on anticoagulation due to falls, concern for head injury. He was put him on ASA.  2. Bradycardia: Symptomatic bradycardia with syncope/falls. He has been stable on telemetry since stopping AV nodal blockers with only mild sinus bradycardia. No significant bradycardia, AV block or pause. Probably ok to discharge from EP standpoint. Would arrange for event monitor at discharge.   3. HTN: BP better. Per primary cardiologist, Dr. Einar Gip.  Dr. Caryl Comes to see and make further recommendations as needed. Signed, Ileene Hutchinson PA-C  Will discharge today with 30 day recorder  Will followup with Dr Lavone Nian and we will see as needed  Virl Axe, MD 11/24/2011 9:55 AM

## 2011-11-28 NOTE — Interval H&P Note (Signed)
History and Physical Interval Note:  11/28/2011 4:39 PM  [name] [name]  has presented today for surgery, with the diagnosis of heart block  The various methods of treatment have been discussed with the patient and family. After consideration of risks, benefits and other options for treatment, the patient has consented to  Procedure(s) (LRB): PERMANENT PACEMAKER INSERTION (N/A) as a surgical intervention .  The patient's history has been reviewed, patient examined, no change in status, stable for surgery.  I have reviewed the patient's chart and labs.  Questions were answered to the patient's satisfaction.     [name] [name]  The patient has recurrent sinus pauses of over 4 seconds.  Given recently syncope, EP study revealing long SNRT,and persistent pauses despite stopping all AV nodal agents, I agree with Dr [name] [name] that the patient should have a pacemaker implantation at this time.  Risks, benefits, alternatives to pacemaker implantation were discussed in detail with the patient today. The patient understands that the risks include but are not limited to bleeding, infection, pneumothorax, perforation, tamponade, vascular damage, renal failure, MI, stroke, death,  and lead dislodgement and wishes to proceed. We will therefore schedule the procedure at this time.

## 2011-11-28 NOTE — Plan of Care (Signed)
Problem: Phase III Progression Outcomes Goal: Pain controlled on oral analgesia Outcome: Completed/Met Date Met:  11/28/11 Patient remains painfree Goal: Tolerating diet Outcome: Completed/Met Date Met:  11/28/11 Tolerated dinner tonight  Goal: Hemodynamically stable Outcome: Completed/Met Date Met:  11/28/11 Blood pressure and heart rate remain stable, atrial pacing at 60  Goal: Limited arm movement per orders Outcome: Completed/Met Date Met:  11/28/11 Left upper extremity remains in the sling Patient verbalizes good understanding of need to keep sling on and not move left arm. Goal: Discharge plan remains appropriate-arrangements made Outcome: Completed/Met Date Met:  11/28/11 Planned discharge for tomorow patient remains stable, no complications at this time.

## 2011-11-28 NOTE — Op Note (Signed)
SURGEON:  Thompson Grayer, MD     PREPROCEDURE DIAGNOSIS:  Symptomatic Bradycardia, sick sinus syndrome with syncope    POSTPROCEDURE DIAGNOSIS:  Symptomatic Bradycardia, sick sinus syndrome with syncope     PROCEDURES:   1. Pacemaker implantation.     INTRODUCTION: [name] [name] is a 76 y.o. male  with a history of sick sinus syndrome and syncope who presents today for pacemaker implantation.  The patient reports intermittent episodes of dizziness and syncope.  He was found to have multiple sinus pauses as the cause.  His AV nodal agents have been held for 1 week.  He underwent EP study after 36 hours of medicine washout and was noted to have sinus node recovery time of 4 seconds.  He was discharged with close follow-up with an event monitor.  Unfortunately, he continues to have 4 second pauses without reversible cause.  The patient therefore presents today for pacemaker implantation.     DESCRIPTION OF PROCEDURE:  Informed written consent was obtained, and   the patient was brought to the electrophysiology lab in a fasting state.  The patient received IV versed and fentanyl sedation for the procedure today.  The patients left chest was prepped and draped in the usual sterile fashion by the EP lab staff. The skin overlying the left deltopectoral region was infiltrated with lidocaine for local analgesia.  A 4-cm incision was made over the left deltopectoral region.  A left subcutaneous pacemaker pocket was fashioned using a combination of sharp and blunt dissection. Electrocautery was required to assure hemostasis.     RA/RV Lead Placement: The left axillary vein was therefore visualized and cannulated.  Through the left axillary vein, a [facility] model [id number] (serial number  [id number]) right atrial lead and a [place]- 94 (serial number  [id number]) right ventricular lead were advanced with fluoroscopic visualization into the right atrial appendage and right ventricular apex  positions respectively.  Initial atrial lead P- waves measured 4 mV with impedance of 608 ohms and a threshold of 1.2 V at 0.5 msec.  Right ventricular lead R-waves measured 11 mV with an impedance of 801 ohms and a threshold of 0.4 V at 0.5 msec.  Both leads were secured to the pectoralis fascia using #2-0 silk over the suture sleeves.   Device Placement:  The leads were then connected to a [place] DR RF model [id number] (serial number [id number]) pacemaker.  The pocket was irrigated with copious gentamicin solution.  The pacemaker was then placed into the pocket.  The pocket was then closed in 2 layers with 2.0 Vicryl suture for the subcutaneous and subcuticular layers.  Steri- Strips and a sterile dressing were then applied.  There were no early apparent complications.     CONCLUSIONS:   1. Successful implantation of a [employer] DR RF dual-chamber pacemaker for symptomatic bradycardia  2. No early apparent complications.           Thompson Grayer, MD 11/28/2011 6:16 PM

## 2011-11-29 ENCOUNTER — Ambulatory Visit (HOSPITAL_COMMUNITY): Payer: Medicare Other

## 2011-11-29 DIAGNOSIS — R55 Syncope and collapse: Secondary | ICD-10-CM | POA: Diagnosis not present

## 2011-11-29 DIAGNOSIS — R079 Chest pain, unspecified: Secondary | ICD-10-CM | POA: Diagnosis not present

## 2011-11-29 DIAGNOSIS — J984 Other disorders of lung: Secondary | ICD-10-CM | POA: Diagnosis not present

## 2011-11-29 DIAGNOSIS — I4892 Unspecified atrial flutter: Secondary | ICD-10-CM | POA: Diagnosis not present

## 2011-11-29 DIAGNOSIS — Z950 Presence of cardiac pacemaker: Secondary | ICD-10-CM | POA: Diagnosis not present

## 2011-11-29 DIAGNOSIS — I495 Sick sinus syndrome: Secondary | ICD-10-CM

## 2011-11-29 DIAGNOSIS — Z23 Encounter for immunization: Secondary | ICD-10-CM | POA: Diagnosis not present

## 2011-11-29 MED ORDER — HYDROCODONE-ACETAMINOPHEN 5-325 MG PO TABS: 1.0000 | ORAL_TABLET | Freq: Four times a day (QID) | ORAL | Status: DC | PRN

## 2011-11-29 NOTE — Progress Notes (Signed)
Doing well this am, no complaints Pacemaker site looks good.  CXR reveals stable leads and no ptx. Pacemaker interrogation is reviewed and normal  DC to home Resume home medicines  Wound check in 10 days, followup with me in 3 months

## 2011-11-29 NOTE — Discharge Summary (Signed)
Discharge Summary   Patient ID: [name] [name],  MRN: [id number], DOB/AGE: [birth year] 74 y.o.  Admit date: 11/28/2011 Discharge date: 11/29/2011  Primary Physician: Philis Fendt, MD Primary Cardiologist: Dr. Tiburcio Bash, MD (EP)  Discharge Diagnoses Principal Problem:  *Sick sinus syndrome Active Problems:  Atrial flutter with controlled response  Essential hypertension, benign  Syncope and collapse   Allergies No Known Allergies  Diagnostic Studies/Procedures  DUAL-CHAMBER PACEMAKER IMPLANTATION - 11/28/11  CONCLUSIONS:  1. Successful implantation of a [employer], corporate DR RF dual-chamber pacemaker for symptomatic bradycardia  2. No early apparent complications.   PA/LATERAL CHEST X-RAY - 11/29/11  CHEST - 2 VIEW  Comparison: 11/20/2011  Findings: The new dual lead transvenous pacemaker is seen with  leads in appropriate position within the right atrium right  ventricle. No pneumothorax or pleural effusion identified.  Mild bilateral lower lung scarring is demonstrated. No evidence of  pulmonary infiltrate or edema. No evidence of mass or  lymphadenopathy. Heart size is normal. Incidental note is made of  bilateral nipple shadows on the frontal projection.  IMPRESSION:  Dual lead transvenous pacemaker in appropriate position. No  evidence of pneumothorax or other acute findings.  History of Present Illness/Discharge Course  [name] [name] is a 76 yo AA male with the above problem list who was admitted on 11/28/11 to undergo elective dual-chamber PPM implantation secondary sick sinus syndrome producing syncope.  The patient had been admitted earlier this month with syncope, evidence of ventricular standstill and episodes of atrial and ventricular asystole lasting for 4 seconds. Initially, syncope was felt to be related to heart block, and he was started on flecainide and diltiazem for atrial flutter and hypertension. He subsequently underwent underwent  atrial flutter ablation on 11/22/11 and did well. Given evidence of symptomatic sick sinus syndrome during this admission, Dr. Einar Gip and Dr. Rayann Heman felt that PPM would be appropriate and alleviate the patient's syncopal episodes. The risks, benefits and details of the procedure were discussed with the patient who agreed to proceed. This is outlined above and resulted in successful implantation of a [employer] dual chamber PPM. There were no complications. The patient was evaluated the following day. CXR revealed no evidence of pneumothorax as above. Device interrogation revealed normal functionality. After Dr. Jackalyn Lombard assessment, the patient was felt to be stable for discharge. He will follow-up in the device/wound clinic in 7-10 days and with Dr. Rayann Heman in 3 months. He will continue all home medications. Norco PRN was prescribed for post-operative pain control.  Discharge Vitals:  Blood pressure 136/62, pulse 60, temperature 97.7 F (36.5 C), temperature source Oral, resp. rate 14, height 5\' 7"  (1.702 m), weight 72.576 kg (160 lb), SpO2 98.00%.   Labs:  Lab 11/23/11 0510  NA 140  K 3.8  CL 101  CO2 28  BUN 15  CREATININE 1.19  CALCIUM 9.3  PROT --  BILITOT --  ALKPHOS --  ALT --  AST --  AMYLASE --  LIPASE --  GLUCOSE 105*   Disposition:   Follow-up Information    Please follow up. (The office will call you for appointment dates and times for wound/device clinic in 7-10 days and follow-up with Dr. Rayann Heman in 3 months. )          Discharge Medications:  Medication List  As of 11/29/2011 10:38 AM   CONTINUE taking these medications         amLODipine 10 MG tablet   Commonly known as: NORVASC  Take 1 tablet (10 mg total) by mouth daily.      aspirin 81 MG chewable tablet   Chew 1 tablet (81 mg total) by mouth daily.      hydrALAZINE 25 MG tablet   Commonly known as: APRESOLINE   Take 1 tablet (25 mg total) by mouth every 8 (eight) hours.      HYDROcodone-acetaminophen  5-325 MG per tablet   Commonly known as: NORCO/VICODIN   Take 1 tablet by mouth every 6 (six) hours as needed.      olmesartan-hydrochlorothiazide 40-12.5 MG per tablet   Commonly known as: BENICAR HCT      simvastatin 20 MG tablet   Commonly known as: ZOCOR      Tamsulosin HCl 0.4 MG Caps   Commonly known as: FLOMAX         STOP taking these medications         lactulose 10 GM/15ML solution          Where to get your medications    These are the prescriptions that you need to pick up.   You may get these medications from any pharmacy.         HYDROcodone-acetaminophen 5-325 MG per tablet           Outstanding Labs/Studies: None  Duration of Discharge Encounter: Greater than 30 minutes including physician time.  Signed, R. Valeria Batman, PA-C 11/29/2011, 10:38 AM  I have seen, examined the patient, and reviewed the above assessment and plan.  Changes to above are made where necessary.    Co Sign: Thompson Grayer, MD 11/29/2011 3:03 PM

## 2011-12-02 ENCOUNTER — Encounter (HOSPITAL_COMMUNITY): Admission: RE | Payer: Self-pay | Source: Ambulatory Visit

## 2011-12-02 ENCOUNTER — Ambulatory Visit (HOSPITAL_COMMUNITY): Admission: RE | Admit: 2011-12-02 | Payer: Medicare Other | Source: Ambulatory Visit | Admitting: Internal Medicine

## 2011-12-02 SURGERY — PERMANENT PACEMAKER INSERTION
Anesthesia: LOCAL

## 2011-12-03 ENCOUNTER — Encounter: Payer: Self-pay | Admitting: *Deleted

## 2011-12-03 DIAGNOSIS — Z950 Presence of cardiac pacemaker: Secondary | ICD-10-CM | POA: Insufficient documentation

## 2011-12-08 ENCOUNTER — Ambulatory Visit (INDEPENDENT_AMBULATORY_CARE_PROVIDER_SITE_OTHER): Payer: Medicare Other | Admitting: *Deleted

## 2011-12-08 ENCOUNTER — Encounter: Payer: Self-pay | Admitting: Internal Medicine

## 2011-12-08 DIAGNOSIS — I495 Sick sinus syndrome: Secondary | ICD-10-CM

## 2011-12-08 LAB — PACEMAKER DEVICE OBSERVATION
AL AMPLITUDE: 4.1 mv
AL IMPEDENCE PM: 412.5 Ohm
BATTERY VOLTAGE: 3.0832 V
RV LEAD IMPEDENCE PM: 600 Ohm
VENTRICULAR PACING PM: 1

## 2011-12-08 NOTE — Progress Notes (Signed)
Wound check-PPM 

## 2011-12-22 ENCOUNTER — Inpatient Hospital Stay (HOSPITAL_COMMUNITY)
Admission: EM | Admit: 2011-12-22 | Discharge: 2011-12-31 | DRG: 392 | Disposition: A | Discharge status: Home Health | Payer: Medicare Other | Attending: Surgery | Admitting: Surgery

## 2011-12-22 ENCOUNTER — Encounter (HOSPITAL_COMMUNITY): Payer: Self-pay | Admitting: *Deleted

## 2011-12-22 DIAGNOSIS — A491 Streptococcal infection, unspecified site: Secondary | ICD-10-CM | POA: Diagnosis present

## 2011-12-22 DIAGNOSIS — I4892 Unspecified atrial flutter: Secondary | ICD-10-CM | POA: Diagnosis present

## 2011-12-22 DIAGNOSIS — K630 Abscess of intestine: Secondary | ICD-10-CM | POA: Diagnosis present

## 2011-12-22 DIAGNOSIS — Z950 Presence of cardiac pacemaker: Secondary | ICD-10-CM | POA: Diagnosis not present

## 2011-12-22 DIAGNOSIS — N289 Disorder of kidney and ureter, unspecified: Secondary | ICD-10-CM | POA: Diagnosis present

## 2011-12-22 DIAGNOSIS — L02219 Cutaneous abscess of trunk, unspecified: Secondary | ICD-10-CM | POA: Diagnosis not present

## 2011-12-22 DIAGNOSIS — IMO0001 Reserved for inherently not codable concepts without codable children: Secondary | ICD-10-CM | POA: Diagnosis not present

## 2011-12-22 DIAGNOSIS — I10 Essential (primary) hypertension: Secondary | ICD-10-CM | POA: Diagnosis present

## 2011-12-22 DIAGNOSIS — K651 Peritoneal abscess: Secondary | ICD-10-CM | POA: Diagnosis not present

## 2011-12-22 DIAGNOSIS — D72829 Elevated white blood cell count, unspecified: Secondary | ICD-10-CM | POA: Diagnosis present

## 2011-12-22 DIAGNOSIS — K5732 Diverticulitis of large intestine without perforation or abscess without bleeding: Secondary | ICD-10-CM | POA: Diagnosis not present

## 2011-12-22 DIAGNOSIS — IMO0002 Reserved for concepts with insufficient information to code with codable children: Secondary | ICD-10-CM | POA: Diagnosis present

## 2011-12-22 DIAGNOSIS — E785 Hyperlipidemia, unspecified: Secondary | ICD-10-CM | POA: Diagnosis present

## 2011-12-22 DIAGNOSIS — C61 Malignant neoplasm of prostate: Secondary | ICD-10-CM | POA: Diagnosis present

## 2011-12-22 DIAGNOSIS — R1032 Left lower quadrant pain: Secondary | ICD-10-CM | POA: Diagnosis not present

## 2011-12-22 HISTORY — DX: Hyperlipidemia, unspecified: E78.5

## 2011-12-22 HISTORY — DX: Malignant neoplasm of prostate: C61

## 2011-12-22 LAB — COMPREHENSIVE METABOLIC PANEL
ALT: 8 U/L (ref 0–53)
AST: 14 U/L (ref 0–37)
Albumin: 3.5 g/dL (ref 3.5–5.2)
Alkaline Phosphatase: 75 U/L (ref 39–117)
BUN: 17 mg/dL (ref 6–23)
CO2: 24 mEq/L (ref 19–32)
Calcium: 9.5 mg/dL (ref 8.4–10.5)
Chloride: 94 mEq/L — ABNORMAL LOW (ref 96–112)
Creatinine, Ser: 0.97 mg/dL (ref 0.50–1.35)
GFR calc Af Amer: >90 mL/min (ref >90)
GFR calc non Af Amer: 80 mL/min — ABNORMAL LOW (ref >90)
Glucose, Bld: 150 mg/dL — ABNORMAL HIGH (ref 70–99)
Potassium: 3.7 mEq/L (ref 3.5–5.1)
Sodium: 133 mEq/L — ABNORMAL LOW (ref 135–145)
Total Bilirubin: 0.5 mg/dL (ref 0.3–1.2)
Total Protein: 7.5 g/dL (ref 6.0–8.3)

## 2011-12-22 LAB — CBC
HCT: 37.2 % — ABNORMAL LOW (ref 39.0–52.0)
Hemoglobin: 12.8 g/dL — ABNORMAL LOW (ref 13.0–17.0)
MCH: 30.6 pg (ref 26.0–34.0)
MCHC: 34.4 g/dL (ref 30.0–36.0)
MCV: 89 fL (ref 78.0–100.0)
Platelets: 270 10*3/uL (ref 150–400)
RBC: 4.18 MIL/uL — ABNORMAL LOW (ref 4.22–5.81)
RDW: 13.1 % (ref 11.5–15.5)
WBC: 19.1 10*3/uL — ABNORMAL HIGH (ref 4.0–10.5)

## 2011-12-22 LAB — URINALYSIS, ROUTINE W REFLEX MICROSCOPIC
Bilirubin Urine: NEGATIVE
Glucose, UA: NEGATIVE mg/dL
Hgb urine dipstick: NEGATIVE
Ketones, ur: NEGATIVE mg/dL
Leukocytes, UA: NEGATIVE
Nitrite: NEGATIVE
Protein, ur: NEGATIVE mg/dL
Specific Gravity, Urine: >1.046 — ABNORMAL HIGH (ref 1.005–1.030)
Urobilinogen, UA: 0.2 mg/dL (ref 0.0–1.0)
pH: 5 (ref 5.0–8.0)

## 2011-12-22 MED ORDER — ONDANSETRON 8 MG PO TBDP: 8.0000 mg | ORAL_TABLET | Freq: Once | ORAL | Status: DC

## 2011-12-22 MED ORDER — HYDROMORPHONE HCL PF 1 MG/ML IJ SOLN
1.0000 mg | Freq: Once | INTRAMUSCULAR | Status: AC
  Administered 2011-12-22: 1 mg via INTRAVENOUS
  Filled 2011-12-22: qty 1

## 2011-12-22 MED ORDER — SODIUM CHLORIDE 0.9 % IV BOLUS (SEPSIS)
1000.0000 mL | Freq: Once | INTRAVENOUS | Status: AC
  Administered 2011-12-22: 1000 mL via INTRAVENOUS

## 2011-12-22 MED ORDER — CIPROFLOXACIN IN D5W 400 MG/200ML IV SOLN
400.0000 mg | Freq: Once | INTRAVENOUS | Status: AC
  Administered 2011-12-22: 400 mg via INTRAVENOUS
  Filled 2011-12-22: qty 200

## 2011-12-22 MED ORDER — METRONIDAZOLE IN NACL 5-0.79 MG/ML-% IV SOLN
500.0000 mg | Freq: Once | INTRAVENOUS | Status: AC
  Administered 2011-12-22: 500 mg via INTRAVENOUS
  Filled 2011-12-22: qty 100

## 2011-12-22 NOTE — ED Notes (Signed)
Pt was told this am by Dr. Prescott Parma that he has an abscess on his colon, CT scan was done this afternoon at 230pm, denies n/v/d, pt has been feeling constipated, has been taking metamucil which has helped, LBM was today at 230pm. Pt hasn't eaten in 2 days due to "I haven't had an appetite", pt states that the only fluids he had in 3 days was the metamucil with a glass of water.

## 2011-12-22 NOTE — ED Provider Notes (Signed)
History    75yM with LLQ pain. Gradual onset several days ago and progressively worsening. Constant and worse with movement. Went to see urologist for it and had CT which should colon abscess and referred to ED. No fever or chills. Some nausea, but no vomiting. No urinary complaints. Denies hx of abdominal surgery.  CSN: [id number]  Arrival date & time 12/22/11  1600   First MD Initiated Contact with Patient 12/22/11 1916      Chief Complaint  Patient presents with  . Abdominal Pain    (Consider location/radiation/quality/duration/timing/severity/associated sxs/prior treatment) HPI  Past Medical History  Diagnosis Date  . Prostate cancer   . Atrial flutter   . Hypertension 04/30/11    Cardioversion 06/16/11  . Hyperlipemia   . Pacemaker     Past Surgical History  Procedure Date  . Cardioversion 06/16/2011    Procedure: CARDIOVERSION;  Surgeon: Laverda Page, MD;  Location: [facility];  Service: Cardiovascular;  Laterality: N/A;  . Tonsillectomy   . Radioactive seed implant   . Cardiac electrophysiology study and ablation   . Insert / replace / remove pacemaker     History reviewed. No pertinent family history.  History  Substance Use Topics  . Smoking status: Former Smoker -- 65 years    Quit date: 07/20/2009  . Smokeless tobacco: Never Used  . Alcohol Use: No      Review of Systems   Review of symptoms negative unless otherwise noted in HPI.   Allergies  Review of patient's allergies indicates no known allergies.  Home Medications   Current Outpatient Rx  Name Route Sig Dispense Refill  . AMLODIPINE BESYLATE 10 MG PO TABS Oral Take 10 mg by mouth daily.    . ASPIRIN 81 MG PO CHEW Oral Chew 81 mg by mouth daily.    Marland Kitchen HYDRALAZINE HCL 25 MG PO TABS Oral Take 25 mg by mouth 3 (three) times daily.    Marland Kitchen OLMESARTAN MEDOXOMIL-HCTZ 40-12.5 MG PO TABS Oral Take 1 tablet by mouth daily.    Marland Kitchen SIMVASTATIN 20 MG PO TABS Oral Take 20 mg by mouth every evening.    Marland Kitchen  TAMSULOSIN HCL 0.4 MG PO CAPS Oral Take 0.4 mg by mouth 2 (two) times daily.     Marland Kitchen HYDROCODONE-ACETAMINOPHEN 5-325 MG PO TABS Oral Take 1 tablet by mouth every 6 (six) hours as needed.      BP 158/73  Pulse 75  Temp 99.2 F (37.3 C) (Oral)  Resp 16  SpO2 100%  Physical Exam  Nursing note and vitals reviewed. Constitutional: He appears well-developed and well-nourished. No distress.  HENT:  Head: Normocephalic and atraumatic.  Eyes: Conjunctivae are normal. Right eye exhibits no discharge. Left eye exhibits no discharge.  Neck: Neck supple.  Cardiovascular: Normal rate, regular rhythm and normal heart sounds.  Exam reveals no gallop and no friction rub.   No murmur heard. Pulmonary/Chest: Effort normal and breath sounds normal. No respiratory distress.  Abdominal: Soft. He exhibits no distension. There is tenderness.       LLQ tenderness with guarding. No rebound. No distension. NO mas palpated.  Musculoskeletal: He exhibits no edema and no tenderness.  Neurological: He is alert.  Skin: Skin is warm and dry.  Psychiatric: He has a normal mood and affect. His behavior is normal. Thought content normal.    ED Course  Procedures (including critical care time)  Labs Reviewed  CBC - Abnormal; Notable for the following:    WBC 19.1 (*)  RBC 4.18 (*)     Hemoglobin 12.8 (*)     HCT 37.2 (*)     All other components within normal limits  COMPREHENSIVE METABOLIC PANEL - Abnormal; Notable for the following:    Sodium 133 (*)     Chloride 94 (*)     Glucose, Bld 150 (*)     GFR calc non Af Amer 80 (*)     All other components within normal limits  URINALYSIS, ROUTINE W REFLEX MICROSCOPIC - Abnormal; Notable for the following:    Specific Gravity, Urine >1.046 (*)     All other components within normal limits   No results found.   1. Peritoneal abscess       MDM  75yM with LLQ pain. Recent CT with colon abscess. Abx. Discussed with surgery. Admit.          Virgel Manifold, MD 12/26/11 [phone]

## 2011-12-22 NOTE — ED Notes (Signed)
Pt sent by urology. Pt c/o left side abd pain. Had CT performed today and pt has abscess in colon.

## 2011-12-22 NOTE — H&P (Signed)
[name] [name] is an 76 y.o. male.   Chief Complaint: abdominal pain HPI: patient is a very pleasant 76 year old male states that 4 days ago he developed the fairly sudden onset of left lower quadrant abdominal pain. The pain has been persistent and constant and gradually worsening ever since that time. There is no radiation. It gets a little better after urination or bowel movements. He felt chilled today but no fever. He has been nauseated without vomiting. The patient had a pacemaker placed for sick sinus syndrome several weeks ago and he states the pain medication constipated him and he has been having to strain at bowel movements. He has no previous history of any significant GI problems or diverticulitis. He has had no previous abdominal surgery. There is no melena or hematochezia.  The patient has a history of prostate cancer followed by Dr. Janice Norrie. He therefore presented to his office today to be evaluated. A CT scan was obtained which has revealed a 3.5 x 4.8 cm pericolonic abscess adjacent to the sigmoid colon felt to be most likely secondary to sigmoid diverticulitis. Also noted or healing left posterior 10th and 11th rib fractures which were known due to a recent fall and syncope that prompted placement of his pacemaker. With this finding the patient was sent to the emergency room for evaluation and treatment and we were called.  Past Medical History  Diagnosis Date  . Prostate cancer   . Atrial flutter   . Hypertension 04/30/11    Cardioversion 06/16/11  . Hyperlipemia   . Pacemaker     Past Surgical History  Procedure Date  . Cardioversion 06/16/2011    Procedure: CARDIOVERSION;  Surgeon: Laverda Page, MD;  Location: [facility];  Service: Cardiovascular;  Laterality: N/A;  . Tonsillectomy   . Radioactive seed implant   . Cardiac electrophysiology study and ablation   . Insert / replace / remove pacemaker    Current Facility-Administered Medications  Medication Dose Route Frequency  Provider Last Rate Last Dose  . ciprofloxacin (CIPRO) IVPB 400 mg  400 mg Intravenous Once Virgel Manifold, MD      . HYDROmorphone (DILAUDID) injection 1 mg  1 mg Intravenous Once Virgel Manifold, MD   1 mg at 12/22/11 2008  . metroNIDAZOLE (FLAGYL) IVPB 500 mg  500 mg Intravenous Once Virgel Manifold, MD   500 mg at 12/22/11 2012  . ondansetron (ZOFRAN-ODT) disintegrating tablet 8 mg  8 mg Oral Once Virgel Manifold, MD      . sodium chloride 0.9 % bolus 1,000 mL  1,000 mL Intravenous Once Virgel Manifold, MD   1,000 mL at 12/22/11 2008   Current Outpatient Prescriptions  Medication Sig Dispense Refill  . amLODipine (NORVASC) 10 MG tablet Take 10 mg by mouth daily.      Marland Kitchen aspirin 81 MG chewable tablet Chew 81 mg by mouth daily.      . hydrALAZINE (APRESOLINE) 25 MG tablet Take 25 mg by mouth 3 (three) times daily.      Marland Kitchen olmesartan-hydrochlorothiazide (BENICAR HCT) 40-12.5 MG per tablet Take 1 tablet by mouth daily.      . simvastatin (ZOCOR) 20 MG tablet Take 20 mg by mouth every evening.      . Tamsulosin HCl (FLOMAX) 0.4 MG CAPS Take 0.4 mg by mouth 2 (two) times daily.       Marland Kitchen HYDROcodone-acetaminophen (NORCO/VICODIN) 5-325 MG per tablet Take 1 tablet by mouth every 6 (six) hours as needed.  History reviewed. No pertinent family history. Social History:  reports that he quit smoking about 2 years ago. He has never used smokeless tobacco. He reports that he does not drink alcohol or use illicit drugs.  Allergies: No Known Allergies   Results for orders placed during the hospital encounter of 12/22/11 (from the past 48 hour(s))  URINALYSIS, ROUTINE W REFLEX MICROSCOPIC     Status: Abnormal   Collection Time   12/22/11  5:25 PM      Component Value Range Comment   Color, Urine YELLOW  YELLOW    APPearance CLEAR  CLEAR    Specific Gravity, Urine >1.046 (*) 1.005 - 1.030    pH 5.0  5.0 - 8.0    Glucose, UA NEGATIVE  NEGATIVE mg/dL    Hgb urine dipstick NEGATIVE  NEGATIVE    Bilirubin  Urine NEGATIVE  NEGATIVE    Ketones, ur NEGATIVE  NEGATIVE mg/dL    Protein, ur NEGATIVE  NEGATIVE mg/dL    Urobilinogen, UA 0.2  0.0 - 1.0 mg/dL    Nitrite NEGATIVE  NEGATIVE    Leukocytes, UA NEGATIVE  NEGATIVE MICROSCOPIC NOT DONE ON URINES WITH NEGATIVE PROTEIN, BLOOD, LEUKOCYTES, NITRITE, OR GLUCOSE <1000 mg/dL.  CBC     Status: Abnormal   Collection Time   12/22/11  5:55 PM      Component Value Range Comment   WBC 19.1 (*) 4.0 - 10.5 K/uL    RBC 4.18 (*) 4.22 - 5.81 MIL/uL    Hemoglobin 12.8 (*) 13.0 - 17.0 g/dL    HCT 37.2 (*) 39.0 - 52.0 %    MCV 89.0  78.0 - 100.0 fL    MCH 30.6  26.0 - 34.0 pg    MCHC 34.4  30.0 - 36.0 g/dL    RDW 13.1  11.5 - 15.5 %    Platelets 270  150 - 400 K/uL   COMPREHENSIVE METABOLIC PANEL     Status: Abnormal   Collection Time   12/22/11  5:55 PM      Component Value Range Comment   Sodium 133 (*) 135 - 145 mEq/L    Potassium 3.7  3.5 - 5.1 mEq/L    Chloride 94 (*) 96 - 112 mEq/L    CO2 24  19 - 32 mEq/L    Glucose, Bld 150 (*) 70 - 99 mg/dL    BUN 17  6 - 23 mg/dL    Creatinine, Ser 0.97  0.50 - 1.35 mg/dL    Calcium 9.5  8.4 - 10.5 mg/dL    Total Protein 7.5  6.0 - 8.3 g/dL    Albumin 3.5  3.5 - 5.2 g/dL    AST 14  0 - 37 U/L    ALT 8  0 - 53 U/L    Alkaline Phosphatase 75  39 - 117 U/L    Total Bilirubin 0.5  0.3 - 1.2 mg/dL    GFR calc non Af Amer 80 (*) >90 mL/min    GFR calc Af Amer >90  >90 mL/min    No results found.  Review of Systems  Constitutional: Positive for chills. Negative for fever and malaise/fatigue.  HENT: Negative.   Respiratory: Negative.   Cardiovascular: Negative.   Gastrointestinal: Positive for nausea, abdominal pain and constipation. Negative for vomiting, diarrhea, blood in stool and melena.  Genitourinary: Negative.   Musculoskeletal: Negative.     Blood pressure 158/73, pulse 75, temperature 99.2 F (37.3 C), temperature source Oral, resp. rate 16, SpO2  100.00%. Physical Exam  General: Pleasant  well-nourished African American male, in no distress Skin: Warm and dry without rash or infection. HEENT: No palpable masses or thyromegaly. Sclera nonicteric. Pupils equal round and reactive. Oropharynx clear. Lymph nodes: No cervical, supraclavicular, or inguinal nodes palpable. Lungs: Breath sounds clear and equal without increased work of breathing Cardiovascular:pacemaker in left upper chest wall with well-healed wound appear Regular rate and rhythm without murmur. No JVD or edema. Peripheral pulses intact. Abdomen: Nondistended. Soft. There is localized moderate left lower quadrant tenderness with localized guarding. No peritoneal signs. No masses palpable. No organomegaly. No palpable hernias. Extremities: No edema or joint swelling or deformity. No chronic venous stasis changes. Neurologic: Alert and fully oriented. Gait normal.   Assessment/Plan Pericolonic abscess, likely secondary to diverticulitis. The patient is stable. We will plan admission for IV antibiotics and we'll consult interventional radiology for consideration for a percutaneous drain.  Hazelynn Mckenny T 12/22/2011, 8:35 PM

## 2011-12-23 ENCOUNTER — Inpatient Hospital Stay (HOSPITAL_COMMUNITY): Payer: Medicare Other

## 2011-12-23 ENCOUNTER — Encounter (HOSPITAL_COMMUNITY): Payer: Self-pay | Admitting: Radiology

## 2011-12-23 LAB — APTT: aPTT: 31 seconds (ref 24–37)

## 2011-12-23 LAB — PROTIME-INR
INR: 1.22 (ref 0.00–1.49)
Prothrombin Time: 15.7 seconds — ABNORMAL HIGH (ref 11.6–15.2)

## 2011-12-23 LAB — CBC
MCH: 30.5 pg (ref 26.0–34.0)
Platelets: 265 10*3/uL (ref 150–400)
RBC: 3.67 MIL/uL — ABNORMAL LOW (ref 4.22–5.81)
WBC: 17.4 10*3/uL — ABNORMAL HIGH (ref 4.0–10.5)

## 2011-12-23 MED ORDER — KCL IN DEXTROSE-NACL 20-5-0.45 MEQ/L-%-% IV SOLN
INTRAVENOUS | Status: DC
  Administered 2011-12-23 – 2011-12-27 (×9): via INTRAVENOUS
  Administered 2011-12-28: 100 mL/h via INTRAVENOUS
  Administered 2011-12-28 – 2011-12-29 (×2): via INTRAVENOUS
  Filled 2011-12-23 (×19): qty 1000

## 2011-12-23 MED ORDER — HYDROCHLOROTHIAZIDE 12.5 MG PO CAPS
12.5000 mg | ORAL_CAPSULE | Freq: Every day | ORAL | Status: DC
  Administered 2011-12-23 – 2011-12-31 (×9): 12.5 mg via ORAL
  Filled 2011-12-23 (×9): qty 1

## 2011-12-23 MED ORDER — METRONIDAZOLE IN NACL 5-0.79 MG/ML-% IV SOLN
500.0000 mg | Freq: Three times a day (TID) | INTRAVENOUS | Status: DC
  Administered 2011-12-23 – 2011-12-29 (×20): 500 mg via INTRAVENOUS
  Filled 2011-12-23 (×21): qty 100

## 2011-12-23 MED ORDER — ONDANSETRON HCL 4 MG/2ML IJ SOLN: 4.0000 mg | Freq: Four times a day (QID) | INTRAMUSCULAR | Status: DC | PRN

## 2011-12-23 MED ORDER — TAMSULOSIN HCL 0.4 MG PO CAPS
0.4000 mg | ORAL_CAPSULE | Freq: Two times a day (BID) | ORAL | Status: DC
  Administered 2011-12-23 – 2011-12-31 (×18): 0.4 mg via ORAL
  Filled 2011-12-23 (×19): qty 1

## 2011-12-23 MED ORDER — OLMESARTAN MEDOXOMIL-HCTZ 40-12.5 MG PO TABS: 1.0000 | ORAL_TABLET | Freq: Every day | ORAL | Status: DC

## 2011-12-23 MED ORDER — MORPHINE SULFATE 2 MG/ML IJ SOLN
2.0000 mg | INTRAMUSCULAR | Status: DC | PRN
  Administered 2011-12-23: 2 mg via INTRAVENOUS
  Administered 2011-12-23: 4 mg via INTRAVENOUS
  Administered 2011-12-23 – 2011-12-25 (×6): 2 mg via INTRAVENOUS
  Filled 2011-12-23: qty 2
  Filled 2011-12-23 (×7): qty 1

## 2011-12-23 MED ORDER — MIDAZOLAM HCL 5 MG/5ML IJ SOLN
INTRAMUSCULAR | Status: AC | PRN
  Administered 2011-12-23 (×2): 2 mg via INTRAVENOUS

## 2011-12-23 MED ORDER — CIPROFLOXACIN IN D5W 400 MG/200ML IV SOLN
400.0000 mg | Freq: Two times a day (BID) | INTRAVENOUS | Status: DC
  Administered 2011-12-23 – 2011-12-29 (×13): 400 mg via INTRAVENOUS
  Filled 2011-12-23 (×14): qty 200

## 2011-12-23 MED ORDER — HYDRALAZINE HCL 25 MG PO TABS
25.0000 mg | ORAL_TABLET | Freq: Three times a day (TID) | ORAL | Status: DC
  Administered 2011-12-23 – 2011-12-31 (×24): 25 mg via ORAL
  Filled 2011-12-23 (×27): qty 1

## 2011-12-23 MED ORDER — AMLODIPINE BESYLATE 10 MG PO TABS
10.0000 mg | ORAL_TABLET | Freq: Every day | ORAL | Status: DC
  Administered 2011-12-23 – 2011-12-31 (×9): 10 mg via ORAL
  Filled 2011-12-23 (×9): qty 1

## 2011-12-23 MED ORDER — IRBESARTAN 300 MG PO TABS
300.0000 mg | ORAL_TABLET | Freq: Every day | ORAL | Status: DC
  Administered 2011-12-23 – 2011-12-31 (×9): 300 mg via ORAL
  Filled 2011-12-23 (×9): qty 1

## 2011-12-23 MED ORDER — FENTANYL CITRATE 0.05 MG/ML IJ SOLN
INTRAMUSCULAR | Status: AC | PRN
  Administered 2011-12-23 (×2): 50 ug via INTRAVENOUS

## 2011-12-23 MED ORDER — PANTOPRAZOLE SODIUM 40 MG IV SOLR
40.0000 mg | Freq: Every day | INTRAVENOUS | Status: DC
  Administered 2011-12-23 – 2011-12-28 (×7): 40 mg via INTRAVENOUS
  Filled 2011-12-23 (×8): qty 40

## 2011-12-23 NOTE — Procedures (Signed)
CT abscess drain catheter placement LLQ 78ml purulent aspirate sent for GS, C&S No complication No blood loss. See complete dictation in [facility].

## 2011-12-23 NOTE — Care Management Note (Signed)
    Page 1 of 1   12/30/2011     2:20:42 PM   CARE MANAGEMENT NOTE 12/30/2011  Patient:  [name] [name], [name] [name]   Account Number:  [id number]  Date Initiated:  12/23/2011  Documentation initiated by:  Sunday Spillers  Subjective/Objective Assessment:   76 yo male admitted with diverticular abscess, plan for perc drain. PTA lived at home with dtr.     Action/Plan:   Arrange HH RN for drain care   Anticipated DC Date:  12/31/2011   Anticipated DC Plan:  [place]  CM consult      [facility] Choice  HOME HEALTH   Choice offered to / List presented to:  C-1 Patient        [place] arranged  HH-1 RN      [place].   Status of service:  Completed, signed off Medicare Important Message given?   (If response is "NO", the following Medicare IM given date fields will be blank) Date Medicare IM given:   Date Additional Medicare IM given:    Discharge Disposition:  [place]  Per UR Regulation:  Reviewed for med. necessity/level of care/duration of stay  If discussed at [place] 7 of Stay Meetings, dates discussed:    Comments:  12-30-11 Sunday Spillers Rn CM 1400 Spoke with patient at bedside. Discussed anticipated d/c home in am and need for drain care. Patient anxious to d/c home. Patient agreeable to [facility] services, will be staying with his dtr at d/c. Patient chose [facility] for [facility] services. Contacted Manuela Schwartz with [facility] to notify her of orders and pending d/c.

## 2011-12-23 NOTE — Progress Notes (Signed)
Patient ID: [name] [name], male   DOB: 1935-07-09, 76 y.o.   MRN: [id number]    Subjective: Pt c/o LLQ abdominal pain.    Objective: Vital signs in last 24 hours: Temp:  [98.9 F (37.2 C)-99.5 F (37.5 C)] 98.9 F (37.2 C) (08/20 0549) Pulse Rate:  [62-75] 74  (08/20 0549) Resp:  [16-18] 16  (08/20 0549) BP: (141-158)/(65-79) 158/79 mmHg (08/20 0549) SpO2:  [96 %-100 %] 100 % (08/20 0549) Weight:  [147 lb 11.2 oz (66.996 kg)] 147 lb 11.2 oz (66.996 kg) (08/20 0652) Last BM Date: 12/22/11  Intake/Output from previous day: 08/19 0701 - 08/20 0700 In: -  Out: 600 [Urine:600] Intake/Output this shift: Total I/O In: -  Out: 100 [Urine:100]  PE: Abd: soft, tender in LLQ and mildly in suprapubic region.  +BS, ND Ht: reg Lungs: CTAB  Lab Results:   Basename 12/23/11 0418 12/22/11 1755  WBC 17.4* 19.1*  HGB 11.2* 12.8*  HCT 32.8* 37.2*  PLT 265 270   BMET  Basename 12/22/11 1755  NA 133*  K 3.7  CL 94*  CO2 24  GLUCOSE 150*  BUN 17  CREATININE 0.97  CALCIUM 9.5   PT/INR No results found for this basename: LABPROT:2,INR:2 in the last 72 hours CMP     Component Value Date/Time   NA 133* 12/22/2011 1755   K 3.7 12/22/2011 1755   CL 94* 12/22/2011 1755   CO2 24 12/22/2011 1755   GLUCOSE 150* 12/22/2011 1755   BUN 17 12/22/2011 1755   CREATININE 0.97 12/22/2011 1755   CALCIUM 9.5 12/22/2011 1755   PROT 7.5 12/22/2011 1755   ALBUMIN 3.5 12/22/2011 1755   AST 14 12/22/2011 1755   ALT 8 12/22/2011 1755   ALKPHOS 75 12/22/2011 1755   BILITOT 0.5 12/22/2011 1755   GFRNONAA 80* 12/22/2011 1755   GFRAA >90 12/22/2011 1755   Lipase  No results found for this basename: lipase       Studies/Results: No results found.  Anti-infectives: Anti-infectives     Start     Dose/Rate Route Frequency Ordered Stop   12/23/11 1000   ciprofloxacin (CIPRO) IVPB 400 mg        400 mg 200 mL/hr over 60 Minutes Intravenous Every 12 hours 12/23/11 0057     12/23/11 0400   metroNIDAZOLE  (FLAGYL) IVPB 500 mg        500 mg 100 mL/hr over 60 Minutes Intravenous Every 8 hours 12/23/11 0057     12/22/11 1930   ciprofloxacin (CIPRO) IVPB 400 mg        400 mg 200 mL/hr over 60 Minutes Intravenous  Once 12/22/11 1928 12/22/11 2239   12/22/11 1930   metroNIDAZOLE (FLAGYL) IVPB 500 mg        500 mg 100 mL/hr over 60 Minutes Intravenous  Once 12/22/11 1928 12/22/11 2112           Assessment/Plan  1. LLQ abscess, likely secondary to diverticulitis 2. Prostate Ca 3. HTN  Plan: 1. Will ask IR to eval today for placement of a perc drain in this abdominal abscess 2. Cont NPO for now. 3. Recheck labs in the am.   LOS: 1 day    Aeden Matranga E 12/23/2011

## 2011-12-23 NOTE — H&P (Signed)
[name] [name] is an 76 y.o. male.   Chief Complaint: LLQ pain x 4 days; fever CT scan shows abdominal abscess - probably diverticular Scheduled now for abscess drain placement HPI: prostate ca; a flutter; HTN; pacemaker  Past Medical History  Diagnosis Date  . Prostate cancer   . Atrial flutter   . Hypertension 04/30/11    Cardioversion 06/16/11  . Hyperlipemia   . Pacemaker     Past Surgical History  Procedure Date  . Cardioversion 06/16/2011    Procedure: CARDIOVERSION;  Surgeon: Laverda Page, MD;  Location: [place];  Service: Cardiovascular;  Laterality: N/A;  . Tonsillectomy   . Radioactive seed implant   . Cardiac electrophysiology study and ablation   . Insert / replace / remove pacemaker     History reviewed. No pertinent family history. Social History:  reports that he quit smoking about 2 years ago. He has never used smokeless tobacco. He reports that he does not drink alcohol or use illicit drugs.  Allergies: No Known Allergies  Medications Prior to Admission  Medication Sig Dispense Refill  . amLODipine (NORVASC) 10 MG tablet Take 10 mg by mouth daily.      Marland Kitchen aspirin 81 MG chewable tablet Chew 81 mg by mouth daily.      . hydrALAZINE (APRESOLINE) 25 MG tablet Take 25 mg by mouth 3 (three) times daily.      Marland Kitchen olmesartan-hydrochlorothiazide (BENICAR HCT) 40-12.5 MG per tablet Take 1 tablet by mouth daily.      . simvastatin (ZOCOR) 20 MG tablet Take 20 mg by mouth every evening.      . Tamsulosin HCl (FLOMAX) 0.4 MG CAPS Take 0.4 mg by mouth 2 (two) times daily.       Marland Kitchen HYDROcodone-acetaminophen (NORCO/VICODIN) 5-325 MG per tablet Take 1 tablet by mouth every 6 (six) hours as needed.        Results for orders placed during the hospital encounter of 12/22/11 (from the past 48 hour(s))  URINALYSIS, ROUTINE W REFLEX MICROSCOPIC     Status: Abnormal   Collection Time   12/22/11  5:25 PM      Component Value Range Comment   Color, Urine YELLOW  YELLOW    APPearance  CLEAR  CLEAR    Specific Gravity, Urine >1.046 (*) 1.005 - 1.030    pH 5.0  5.0 - 8.0    Glucose, UA NEGATIVE  NEGATIVE mg/dL    Hgb urine dipstick NEGATIVE  NEGATIVE    Bilirubin Urine NEGATIVE  NEGATIVE    Ketones, ur NEGATIVE  NEGATIVE mg/dL    Protein, ur NEGATIVE  NEGATIVE mg/dL    Urobilinogen, UA 0.2  0.0 - 1.0 mg/dL    Nitrite NEGATIVE  NEGATIVE    Leukocytes, UA NEGATIVE  NEGATIVE MICROSCOPIC NOT DONE ON URINES WITH NEGATIVE PROTEIN, BLOOD, LEUKOCYTES, NITRITE, OR GLUCOSE <1000 mg/dL.  CBC     Status: Abnormal   Collection Time   12/22/11  5:55 PM      Component Value Range Comment   WBC 19.1 (*) 4.0 - 10.5 K/uL    RBC 4.18 (*) 4.22 - 5.81 MIL/uL    Hemoglobin 12.8 (*) 13.0 - 17.0 g/dL    HCT 37.2 (*) 39.0 - 52.0 %    MCV 89.0  78.0 - 100.0 fL    MCH 30.6  26.0 - 34.0 pg    MCHC 34.4  30.0 - 36.0 g/dL    RDW 13.1  11.5 - 15.5 %  Platelets 270  150 - 400 K/uL   COMPREHENSIVE METABOLIC PANEL     Status: Abnormal   Collection Time   12/22/11  5:55 PM      Component Value Range Comment   Sodium 133 (*) 135 - 145 mEq/L    Potassium 3.7  3.5 - 5.1 mEq/L    Chloride 94 (*) 96 - 112 mEq/L    CO2 24  19 - 32 mEq/L    Glucose, Bld 150 (*) 70 - 99 mg/dL    BUN 17  6 - 23 mg/dL    Creatinine, Ser 0.97  0.50 - 1.35 mg/dL    Calcium 9.5  8.4 - 10.5 mg/dL    Total Protein 7.5  6.0 - 8.3 g/dL    Albumin 3.5  3.5 - 5.2 g/dL    AST 14  0 - 37 U/L    ALT 8  0 - 53 U/L    Alkaline Phosphatase 75  39 - 117 U/L    Total Bilirubin 0.5  0.3 - 1.2 mg/dL    GFR calc non Af Amer 80 (*) >90 mL/min    GFR calc Af Amer >90  >90 mL/min   CBC     Status: Abnormal   Collection Time   12/23/11  4:18 AM      Component Value Range Comment   WBC 17.4 (*) 4.0 - 10.5 K/uL    RBC 3.67 (*) 4.22 - 5.81 MIL/uL    Hemoglobin 11.2 (*) 13.0 - 17.0 g/dL    HCT 32.8 (*) 39.0 - 52.0 %    MCV 89.4  78.0 - 100.0 fL    MCH 30.5  26.0 - 34.0 pg    MCHC 34.1  30.0 - 36.0 g/dL    RDW 13.0  11.5 - 15.5 %     Platelets 265  150 - 400 K/uL   PROTIME-INR     Status: Abnormal   Collection Time   12/23/11  8:22 AM      Component Value Range Comment   Prothrombin Time 15.7 (*) 11.6 - 15.2 seconds    INR 1.22  0.00 - 1.49   APTT     Status: Normal   Collection Time   12/23/11  8:22 AM      Component Value Range Comment   aPTT 31  24 - 37 seconds    No results found.  Review of Systems  Constitutional: Negative for fever.  Respiratory: Negative for shortness of breath.   Cardiovascular: Negative for chest pain.  Gastrointestinal: Positive for nausea and abdominal pain.    Blood pressure 158/79, pulse 74, temperature 98.9 F (37.2 C), temperature source Oral, resp. rate 16, height 5\' 7"  (1.702 m), weight 147 lb 11.2 oz (66.996 kg), SpO2 100.00%. Physical Exam  Constitutional: He is oriented to person, place, and time. He appears well-developed and well-nourished.  Cardiovascular: Normal rate, regular rhythm and normal heart sounds.   No murmur heard. Respiratory: Effort normal and breath sounds normal. He has no wheezes.  GI: Soft. Bowel sounds are normal. There is tenderness.  Musculoskeletal: Normal range of motion.  Neurological: He is alert and oriented to person, place, and time.  Skin: Skin is warm and dry.  Psychiatric: He has a normal mood and affect. His behavior is normal. Judgment and thought content normal.     Assessment/Plan Diverticular abscess Scheduled for drain placement in IR Pt aware of procedure benefits and risks and agreeable to proceed. Consent signed and in  chart  Denicia Pagliarulo A 12/23/2011, 9:10 AM

## 2011-12-23 NOTE — Progress Notes (Signed)
General Surgery [facility] Patient seen and examined.  Discussed with Drs. Nesi and Hoxworth.  Plan percutaneous drainage by IR later today. Earnstine Regal, MD, [facility] Office: [phone]

## 2011-12-24 LAB — BASIC METABOLIC PANEL
BUN: 17 mg/dL (ref 6–23)
Calcium: 8.6 mg/dL (ref 8.4–10.5)
Creatinine, Ser: 1.51 mg/dL — ABNORMAL HIGH (ref 0.50–1.35)
GFR calc non Af Amer: 44 mL/min — ABNORMAL LOW (ref >90)
Glucose, Bld: 121 mg/dL — ABNORMAL HIGH (ref 70–99)

## 2011-12-24 LAB — CBC
HCT: 32.1 % — ABNORMAL LOW (ref 39.0–52.0)
Hemoglobin: 10.9 g/dL — ABNORMAL LOW (ref 13.0–17.0)
MCH: 30.1 pg (ref 26.0–34.0)
MCHC: 34 g/dL (ref 30.0–36.0)

## 2011-12-24 NOTE — Progress Notes (Signed)
General Surgery [facility] Patient seen and examined.  Pain mildly improved.  Episode incontinence with diarrhea.  WBC improved.  Clear liquid diet tolerated. Earnstine Regal, MD, [facility] Office: [phone]

## 2011-12-24 NOTE — Progress Notes (Signed)
Patient ID: [name] [name], male   DOB: 1936/02/04, 76 y.o.   MRN: [id number]    Subjective: Pt feels better today.  Pain is less after drain placed.    Objective: Vital signs in last 24 hours: Temp:  [98.5 F (36.9 C)-100.3 F (37.9 C)] 98.5 F (36.9 C) (08/21 0615) Pulse Rate:  [60-79] 66  (08/21 0615) Resp:  [10-20] 18  (08/21 0615) BP: (94-183)/(51-102) 114/51 mmHg (08/21 0615) SpO2:  [95 %-100 %] 98 % (08/21 0615) Last BM Date: 12/23/11  Intake/Output from previous day: 08/20 0701 - 08/21 0700 In: 1910 [I.V.:1100; IV Piggyback:800] Out: 560 [Urine:525; Drains:35] Intake/Output this shift:    PE: Abd: soft, still tender, but less so, +BS, ND, JP with serous output currently, very minimal  Lab Results:   Basename 12/24/11 0405 12/23/11 0418  WBC 12.9* 17.4*  HGB 10.9* 11.2*  HCT 32.1* 32.8*  PLT 234 265   BMET  Basename 12/24/11 0405 12/22/11 1755  NA 132* 133*  K 3.6 3.7  CL 98 94*  CO2 24 24  GLUCOSE 121* 150*  BUN 17 17  CREATININE 1.51* 0.97  CALCIUM 8.6 9.5   PT/INR  Basename 12/23/11 0822  LABPROT 15.7*  INR 1.22   CMP     Component Value Date/Time   NA 132* 12/24/2011 0405   K 3.6 12/24/2011 0405   CL 98 12/24/2011 0405   CO2 24 12/24/2011 0405   GLUCOSE 121* 12/24/2011 0405   BUN 17 12/24/2011 0405   CREATININE 1.51* 12/24/2011 0405   CALCIUM 8.6 12/24/2011 0405   PROT 7.5 12/22/2011 1755   ALBUMIN 3.5 12/22/2011 1755   AST 14 12/22/2011 1755   ALT 8 12/22/2011 1755   ALKPHOS 75 12/22/2011 1755   BILITOT 0.5 12/22/2011 1755   GFRNONAA 44* 12/24/2011 0405   GFRAA 51* 12/24/2011 0405   Lipase  No results found for this basename: lipase       Studies/Results: Ct Guided Abscess Drain  12/23/2011  *RADIOLOGY REPORT*  Clinical data:  Diverticular abscess  CT-GUIDED PERITONEAL ABSCESS DRAINAGE CATHETER PLACEMENT  Technique and findings: The procedure, risks (including but not limited to bleeding, infection, organ damage), benefits, and alternatives  were explained to the patient.  Questions regarding the procedure were encouraged and answered.  The patient understands and consents to the procedure.Select axial scans were obtained through the pelvis.  The left lower quadrant collection was localized and an appropriate skin entry site was determined. Site was marked, prepped with Betadine, draped in usual sterile fashion, infiltrated locally with 1% lidocaine.  Intravenous Fentanyl and Versed were administered as conscious sedation during continuous cardiorespiratory monitoring by the radiology RN, with a total moderate sedation time of 15 minutes.  Under CT fluoroscopic guidance, an 18 gauge percutaneous entry needle was advanced into the collection taking care to avoid bowel. Gas and fluid spontaneously returned from the collection.  An Amplatz wire advanced easily within the collection, its position confirmed on CT fluoroscopy.  The tract was dilated to facilitate placement of a 12-French pigtail catheter, formed centrally within the collection.  Approximate 5 ml of purulent fluid were aspirated, sent for Gram stain, culture and sensitivity.  The catheter was secured externally with O-Prolene suture and Statlock and placed to gravity bag. The patient tolerated the procedure well.  No immediate complication.  IMPRESSION: Technically successful left lower quadrant peritoneal abscess drainage catheter placement under CT.   Original Report Authenticated By: Trecia Rogers, M.D.  Anti-infectives: Anti-infectives     Start     Dose/Rate Route Frequency Ordered Stop   12/23/11 1000   ciprofloxacin (CIPRO) IVPB 400 mg        400 mg 200 mL/hr over 60 Minutes Intravenous Every 12 hours 12/23/11 0057     12/23/11 0400   metroNIDAZOLE (FLAGYL) IVPB 500 mg        500 mg 100 mL/hr over 60 Minutes Intravenous Every 8 hours 12/23/11 0057     12/22/11 1930   ciprofloxacin (CIPRO) IVPB 400 mg        400 mg 200 mL/hr over 60 Minutes Intravenous  Once  12/22/11 1928 12/22/11 2239   12/22/11 1930   metroNIDAZOLE (FLAGYL) IVPB 500 mg        500 mg 100 mL/hr over 60 Minutes Intravenous  Once 12/22/11 1928 12/22/11 2112           Assessment/Plan  1. Intra-abd abscess, likely secondary to diverticulitis  Plan: 1. Will allow clear liquids today and follow drain output. 2. Continue abx therapy. 3. mobilize   LOS: 2 days    Shirleyann Montero E 12/24/2011

## 2011-12-24 NOTE — Progress Notes (Signed)
[facility] PA notified of patient having loose runny stools now and last night. No new orders at this time. PA to be notified if patient has multiple episodes of loose stools.

## 2011-12-24 NOTE — Progress Notes (Signed)
Subjective: Pt ok. Feeling some better this am.  Objective: Physical Exam: BP 114/51  Pulse 66  Temp 98.5 F (36.9 C) (Oral)  Resp 18  Ht 5\' 7"  (1.702 m)  Wt 147 lb 11.2 oz (66.996 kg)  BMI 23.13 kg/m2  SpO2 98% Abdomen: mildly tender LLQ and at drain site Drain intact, site clean. Output serous but some purulence in tubing.    Labs: CBC  Basename 12/24/11 0405 12/23/11 0418  WBC 12.9* 17.4*  HGB 10.9* 11.2*  HCT 32.1* 32.8*  PLT 234 265   BMET  Basename 12/24/11 0405 12/22/11 1755  NA 132* 133*  K 3.6 3.7  CL 98 94*  CO2 24 24  GLUCOSE 121* 150*  BUN 17 17  CREATININE 1.51* 0.97  CALCIUM 8.6 9.5   LFT  Basename 12/22/11 1755  PROT 7.5  ALBUMIN 3.5  AST 14  ALT 8  ALKPHOS 75  BILITOT 0.5  BILIDIR --  IBILI --  LIPASE --   PT/INR  Basename 12/23/11 0822  LABPROT 15.7*  INR 1.22     Studies/Results: Ct Guided Abscess Drain  12/23/2011  *RADIOLOGY REPORT*  Clinical data:  Diverticular abscess  CT-GUIDED PERITONEAL ABSCESS DRAINAGE CATHETER PLACEMENT  Technique and findings: The procedure, risks (including but not limited to bleeding, infection, organ damage), benefits, and alternatives were explained to the patient.  Questions regarding the procedure were encouraged and answered.  The patient understands and consents to the procedure.Select axial scans were obtained through the pelvis.  The left lower quadrant collection was localized and an appropriate skin entry site was determined. Site was marked, prepped with Betadine, draped in usual sterile fashion, infiltrated locally with 1% lidocaine.  Intravenous Fentanyl and Versed were administered as conscious sedation during continuous cardiorespiratory monitoring by the radiology RN, with a total moderate sedation time of 15 minutes.  Under CT fluoroscopic guidance, an 18 gauge percutaneous entry needle was advanced into the collection taking care to avoid bowel. Gas and fluid spontaneously returned from  the collection.  An Amplatz wire advanced easily within the collection, its position confirmed on CT fluoroscopy.  The tract was dilated to facilitate placement of a 12-French pigtail catheter, formed centrally within the collection.  Approximate 5 ml of purulent fluid were aspirated, sent for Gram stain, culture and sensitivity.  The catheter was secured externally with O-Prolene suture and Statlock and placed to gravity bag. The patient tolerated the procedure well.  No immediate complication.  IMPRESSION: Technically successful left lower quadrant peritoneal abscess drainage catheter placement under CT.   Original Report Authenticated By: Trecia Rogers, M.D.     Assessment/Plan: Presumed diverticulitis with abscess S/p perc drain 8/18 Cont to monitor output/progress    LOS: 2 days    Ascencion Dike PA-C 12/24/2011 10:07 AM

## 2011-12-25 NOTE — Progress Notes (Signed)
  Subjective: C/o pain with urination and lower abd pain with recent BM  Objective: Vital signs in last 24 hours: Temp:  [98.4 F (36.9 C)-101.8 F (38.8 C)] 98.4 F (36.9 C) (08/22 0532) Pulse Rate:  [73-80] 80  (08/22 0532) Resp:  [16-18] 18  (08/22 0532) BP: (114-135)/(60-72) 114/60 mmHg (08/22 0532) SpO2:  [96 %-97 %] 96 % (08/22 0532) Last BM Date: 12/25/11  Intake/Output from previous day: 08/21 0701 - 08/22 0700 In: 2520 [P.O.:110; I.V.:2400] Out: [id number] [Urine:1550; Drains:5] Intake/Output this shift: Total I/O In: [id number] [P.O.:80; I.V.:800; Other:5; IV [id number] Out: 45 [Urine:800; Drains:5]  PE:  Awake, alert, drain intact - site clean and dry. Air and brown serous material in bag with feculent material in tubing.  Soft abdomen, tender to pelvic area. Positive bowel sounds.   Lab Results:   Basename 12/24/11 0405 12/23/11 0418  WBC 12.9* 17.4*  HGB 10.9* 11.2*  HCT 32.1* 32.8*  PLT 234 265   BMET  Basename 12/24/11 0405 12/22/11 1755  NA 132* 133*  K 3.6 3.7  CL 98 94*  CO2 24 24  GLUCOSE 121* 150*  BUN 17 17  CREATININE 1.51* 0.97  CALCIUM 8.6 9.5   PT/INR  Basename 12/23/11 0822  LABPROT 15.7*  INR 1.22   ABG No results found for this basename: PHART:2,PCO2:2,PO2:2,HCO3:2 in the last 72 hours  Studies/Results: No results found.  Anti-infectives: Anti-infectives     Start     Dose/Rate Route Frequency Ordered Stop   12/23/11 1000   ciprofloxacin (CIPRO) IVPB 400 mg        400 mg 200 mL/hr over 60 Minutes Intravenous Every 12 hours 12/23/11 0057     12/23/11 0400   metroNIDAZOLE (FLAGYL) IVPB 500 mg        500 mg 100 mL/hr over 60 Minutes Intravenous Every 8 hours 12/23/11 0057     12/22/11 1930   ciprofloxacin (CIPRO) IVPB 400 mg        400 mg 200 mL/hr over 60 Minutes Intravenous  Once 12/22/11 1928 12/22/11 2239   12/22/11 1930   metroNIDAZOLE (FLAGYL) IVPB 500 mg        500 mg 100 mL/hr over 60 Minutes Intravenous  Once  12/22/11 1928 12/22/11 2112          Assessment/Plan:  Diverticular abscess s/p drain with fistula per PE findings. Cipro on board as well for urinary symptoms. Continue drain with gentle flushes - repeat CT few days. IR to follow  Janah Mcculloh D 12/25/2011

## 2011-12-25 NOTE — Progress Notes (Signed)
General Surgery [facility] Patient seen and examined.  Moderate LLQ tenderness - RLQ tenderness improved.  Taking clear liquid diet.  Will require repeat CT scan to evaluate in a few days. Earnstine Regal, MD, [facility] Office: [phone]

## 2011-12-25 NOTE — Progress Notes (Signed)
Patient ID: [name] [name], male   DOB: Nov 24, 1935, 76 y.o.   MRN: [id number]    Subjective: Pt c/o gas pains.  Says he feels a little better.  Objective: Vital signs in last 24 hours: Temp:  [98.4 F (36.9 C)-101.8 F (38.8 C)] 98.4 F (36.9 C) (08/22 0532) Pulse Rate:  [73-80] 80  (08/22 0532) Resp:  [16-18] 18  (08/22 0532) BP: (114-135)/(53-72) 114/60 mmHg (08/22 0532) SpO2:  [96 %-97 %] 96 % (08/22 0532) Last BM Date: 12/25/11  Intake/Output from previous day: 08/21 0701 - 08/22 0700 In: 2520 [P.O.:110; I.V.:2400] Out: [id number] [Urine:1550; Drains:5] Intake/Output this shift: Total I/O In: -  Out: 300 [Urine:300]  PE: Abd: soft, increased tenderness in RLQ, about the same amount of pain in LLQ.  Drain with minimal serous output.  +BS, ND  Lab Results:   Basename 12/24/11 0405 12/23/11 0418  WBC 12.9* 17.4*  HGB 10.9* 11.2*  HCT 32.1* 32.8*  PLT 234 265   BMET  Basename 12/24/11 0405 12/22/11 1755  NA 132* 133*  K 3.6 3.7  CL 98 94*  CO2 24 24  GLUCOSE 121* 150*  BUN 17 17  CREATININE 1.51* 0.97  CALCIUM 8.6 9.5   PT/INR  Basename 12/23/11 0822  LABPROT 15.7*  INR 1.22   CMP     Component Value Date/Time   NA 132* 12/24/2011 0405   K 3.6 12/24/2011 0405   CL 98 12/24/2011 0405   CO2 24 12/24/2011 0405   GLUCOSE 121* 12/24/2011 0405   BUN 17 12/24/2011 0405   CREATININE 1.51* 12/24/2011 0405   CALCIUM 8.6 12/24/2011 0405   PROT 7.5 12/22/2011 1755   ALBUMIN 3.5 12/22/2011 1755   AST 14 12/22/2011 1755   ALT 8 12/22/2011 1755   ALKPHOS 75 12/22/2011 1755   BILITOT 0.5 12/22/2011 1755   GFRNONAA 44* 12/24/2011 0405   GFRAA 51* 12/24/2011 0405   Lipase  No results found for this basename: lipase       Studies/Results: Ct Guided Abscess Drain  12/23/2011  *RADIOLOGY REPORT*  Clinical data:  Diverticular abscess  CT-GUIDED PERITONEAL ABSCESS DRAINAGE CATHETER PLACEMENT  Technique and findings: The procedure, risks (including but not limited to bleeding,  infection, organ damage), benefits, and alternatives were explained to the patient.  Questions regarding the procedure were encouraged and answered.  The patient understands and consents to the procedure.Select axial scans were obtained through the pelvis.  The left lower quadrant collection was localized and an appropriate skin entry site was determined. Site was marked, prepped with Betadine, draped in usual sterile fashion, infiltrated locally with 1% lidocaine.  Intravenous Fentanyl and Versed were administered as conscious sedation during continuous cardiorespiratory monitoring by the radiology RN, with a total moderate sedation time of 15 minutes.  Under CT fluoroscopic guidance, an 18 gauge percutaneous entry needle was advanced into the collection taking care to avoid bowel. Gas and fluid spontaneously returned from the collection.  An Amplatz wire advanced easily within the collection, its position confirmed on CT fluoroscopy.  The tract was dilated to facilitate placement of a 12-French pigtail catheter, formed centrally within the collection.  Approximate 5 ml of purulent fluid were aspirated, sent for Gram stain, culture and sensitivity.  The catheter was secured externally with O-Prolene suture and Statlock and placed to gravity bag. The patient tolerated the procedure well.  No immediate complication.  IMPRESSION: Technically successful left lower quadrant peritoneal abscess drainage catheter placement under CT.   Original Report Authenticated  By: D. DANIEL HASSELL III, M.D.     Anti-infectives: Anti-infectives     Start     Dose/Rate Route Frequency Ordered Stop   12/23/11 1000   ciprofloxacin (CIPRO) IVPB 400 mg        400 mg 200 mL/hr over 60 Minutes Intravenous Every 12 hours 12/23/11 0057     12/23/11 0400   metroNIDAZOLE (FLAGYL) IVPB 500 mg        500 mg 100 mL/hr over 60 Minutes Intravenous Every 8 hours 12/23/11 0057     12/22/11 1930   ciprofloxacin (CIPRO) IVPB 400 mg         400 mg 200 mL/hr over 60 Minutes Intravenous  Once 12/22/11 1928 12/22/11 2239   12/22/11 1930   metroNIDAZOLE (FLAGYL) IVPB 500 mg        500 mg 100 mL/hr over 60 Minutes Intravenous  Once 12/22/11 1928 12/22/11 2112           Assessment/Plan  1. Diverticulitis with abscess, s/p perc drain  Plan: 1. Will continue on clear liquids for now secondary to new pain in RLQ and no real improvement in LLQ tenderness. 2. Cont drain and abx therapy 3. Repeat CT scan in 3-4 days   LOS: 3 days    Orvin Netter E 12/25/2011

## 2011-12-26 LAB — CBC
MCH: 30.7 pg (ref 26.0–34.0)
MCHC: 34.9 g/dL (ref 30.0–36.0)
Platelets: 274 10*3/uL (ref 150–400)
RDW: 13.2 % (ref 11.5–15.5)

## 2011-12-26 NOTE — Progress Notes (Signed)
Patient ID: [name] [name], male   DOB: [birth year], 76 y.o.   MRN: [id number]    Subjective: Pt feels a little better today.  Tolerating clear liquids with no nausea or increased pain. C/o pain with BM  Objective: Vital signs in last 24 hours: Temp:  [98.5 F (36.9 C)-99.8 F (37.7 C)] 98.9 F (37.2 C) (08/23 0600) Pulse Rate:  [72-97] 72  (08/23 0600) Resp:  [18] 18  (08/23 0600) BP: (110-116)/(58-63) 110/63 mmHg (08/23 0600) SpO2:  [96 %-97 %] 96 % (08/23 0600) Last BM Date: 12/25/11  Intake/Output from previous day: 08/22 0701 - 08/23 0700 In: 3701.7 [P.O.:520; I.V.:1976.7; IV Piggyback:1200] Out: 2685 [Urine:2650; Drains:35] Intake/Output this shift:    PE: Abd: soft, less tender today, +BS, bag now some air present and more thin feculent appearing output. Heart: regular Lungs: CTAB  Lab Results:   Basename 12/26/11 0340 12/24/11 0405  WBC 13.5* 12.9*  HGB 11.2* 10.9*  HCT 32.1* 32.1*  PLT 274 234   BMET  Basename 12/24/11 0405  NA 132*  K 3.6  CL 98  CO2 24  GLUCOSE 121*  BUN 17  CREATININE 1.51*  CALCIUM 8.6   PT/INR No results found for this basename: LABPROT:2,INR:2 in the last 72 hours CMP     Component Value Date/Time   NA 132* 12/24/2011 0405   K 3.6 12/24/2011 0405   CL 98 12/24/2011 0405   CO2 24 12/24/2011 0405   GLUCOSE 121* 12/24/2011 0405   BUN 17 12/24/2011 0405   CREATININE 1.51* 12/24/2011 0405   CALCIUM 8.6 12/24/2011 0405   PROT 7.5 12/22/2011 1755   ALBUMIN 3.5 12/22/2011 1755   AST 14 12/22/2011 1755   ALT 8 12/22/2011 1755   ALKPHOS 75 12/22/2011 1755   BILITOT 0.5 12/22/2011 1755   GFRNONAA 44* 12/24/2011 0405   GFRAA 51* 12/24/2011 0405   Lipase  No results found for this basename: lipase       Studies/Results: No results found.  Anti-infectives: Anti-infectives     Start     Dose/Rate Route Frequency Ordered Stop   12/23/11 1000   ciprofloxacin (CIPRO) IVPB 400 mg        400 mg 200 mL/hr over 60 Minutes Intravenous Every  12 hours 12/23/11 0057     12/23/11 0400   metroNIDAZOLE (FLAGYL) IVPB 500 mg        500 mg 100 mL/hr over 60 Minutes Intravenous Every 8 hours 12/23/11 0057     12/22/11 1930   ciprofloxacin (CIPRO) IVPB 400 mg        400 mg 200 mL/hr over 60 Minutes Intravenous  Once 12/22/11 1928 12/22/11 2239   12/22/11 1930   metroNIDAZOLE (FLAGYL) IVPB 500 mg        500 mg 100 mL/hr over 60 Minutes Intravenous  Once 12/22/11 1928 12/22/11 2112           Assessment/Plan  1. Diverticulitis with abscess, s/p perc drain 2. ? Fistula to drain 3. Leukocytosis  Plan: 1. Follow WBC and drain output.  His drain output now has air in bag and more thin feculent appearing output, concerning for fistula 2. Patient's pain is improved today.  Will try full liquids and see how he does. 3. Cont IV abx therapy.   LOS: 4 days    Whitnee Orzel E 12/26/2011

## 2011-12-26 NOTE — Progress Notes (Signed)
Subjective: Pt ok. Says he has pain when he strains to pass BM. No longer reports pain with voiding. Denies air in urine stream. On clears.  Objective: Physical Exam: BP 110/63  Pulse 72  Temp 98.9 F (37.2 C) (Oral)  Resp 18  Ht 5\' 7"  (1.702 m)  Wt 147 lb 11.2 oz (66.996 kg)  BMI 23.13 kg/m2  SpO2 96% Drain intact, site clean. Output looks somewhat enteric. There is also air in tubing and bag. Air was emptied from bag.   Labs: CBC  Basename 12/26/11 0340 12/24/11 0405  WBC 13.5* 12.9*  HGB 11.2* 10.9*  HCT 32.1* 32.1*  PLT 274 234   BMET  Basename 12/24/11 0405  NA 132*  K 3.6  CL 98  CO2 24  GLUCOSE 121*  BUN 17  CREATININE 1.51*  CALCIUM 8.6   LFT No results found for this basename: PROT,ALBUMIN,AST,ALT,ALKPHOS,BILITOT,BILIDIR,IBILI,LIPASE in the last 72 hours PT/INR No results found for this basename: LABPROT:2,INR:2 in the last 72 hours   Studies/Results: No results found.  Assessment/Plan: Diverticular abscess, now clinically suspicious for fistula S/p perc drain 8/20 Abscess was both air/fluid collection, which could account for air output in bag, but will cont to monitor.  WBC slightly up. Discussed with [facility], planning for CT next few days.    LOS: 4 days    Ascencion Dike PA-C 12/26/2011 8:50 AM

## 2011-12-26 NOTE — Progress Notes (Signed)
General Surgery [facility] Patient with some improvement in pain.  WBC remains slightly elevated.  Mild tenderness LLQ.  Drain with thin yellow output.  Will plan to continue restricted diet, IV abx with Cipro and Flagyl, and plan to repeat CT abdomen on Monday, 8/26. Earnstine Regal, MD, [facility] Office: [phone]

## 2011-12-27 LAB — CBC
MCV: 88.9 fL (ref 78.0–100.0)
Platelets: 300 10*3/uL (ref 150–400)
RDW: 13.3 % (ref 11.5–15.5)
WBC: 11.3 10*3/uL — ABNORMAL HIGH (ref 4.0–10.5)

## 2011-12-27 LAB — CULTURE, ROUTINE-ABSCESS

## 2011-12-27 NOTE — Progress Notes (Signed)
Patient ID: [name] [name], male   DOB: 08/25/1935, 76 y.o.   MRN: [id number]    Subjective: Feels better daily. Just mild pain at the left lower quadrant drain site. Tolerating full liquid diet.  Objective: Vital signs in last 24 hours: Temp:  [98.9 F (37.2 C)-99.3 F (37.4 C)] 99.3 F (37.4 C) (08/24 0605) Pulse Rate:  [64-80] 80  (08/24 0605) Resp:  [18] 18  (08/24 0605) BP: (109-146)/(46-57) 116/55 mmHg (08/24 0605) SpO2:  [97 %] 97 % (08/24 0605) Last BM Date: 12/26/11  Intake/Output from previous day: 08/23 0701 - 08/24 0700 In: 2766.7 [P.O.:840; I.V.:1626.7; IV Piggyback:300] Out: 3721 [Urine:3675; Drains:45; Stool:1] Intake/Output this shift:    General appearance: alert and no distress GI: abnormal findings:  mild tenderness in the LLQ drain with cloudy yellow drainage.  Lab Results:   Basename 12/27/11 0439 12/26/11 0340  WBC 11.3* 13.5*  HGB 10.5* 11.2*  HCT 31.1* 32.1*  PLT 300 274   BMET No results found for this basename: NA:2,K:2,CL:2,CO2:2,GLUCOSE:2,BUN:2,CREATININE:2,CALCIUM:2 in the last 72 hours   Studies/Results: No results found.  Anti-infectives: Anti-infectives     Start     Dose/Rate Route Frequency Ordered Stop   12/23/11 1000   ciprofloxacin (CIPRO) IVPB 400 mg        400 mg 200 mL/hr over 60 Minutes Intravenous Every 12 hours 12/23/11 0057     12/23/11 0400   metroNIDAZOLE (FLAGYL) IVPB 500 mg        500 mg 100 mL/hr over 60 Minutes Intravenous Every 8 hours 12/23/11 0057     12/22/11 1930   ciprofloxacin (CIPRO) IVPB 400 mg        400 mg 200 mL/hr over 60 Minutes Intravenous  Once 12/22/11 1928 12/22/11 2239   12/22/11 1930   metroNIDAZOLE (FLAGYL) IVPB 500 mg        500 mg 100 mL/hr over 60 Minutes Intravenous  Once 12/22/11 1928 12/22/11 2112          Assessment/Plan: Diverticulitis with pericolonic abscess. Responding appropriately to percutaneous drainage and intravenous antibiotics. Continue current treatment.    LOS: 5 days    Klarisa Barman T 12/27/2011

## 2011-12-27 NOTE — Progress Notes (Signed)
  Subjective: divertic abscess drain placed 8/20 Better daily  Objective: Vital signs in last 24 hours: Temp:  [98.9 F (37.2 C)-99.3 F (37.4 C)] 99.3 F (37.4 C) (08/24 0605) Pulse Rate:  [64-80] 80  (08/24 0605) Resp:  [18] 18  (08/24 0605) BP: (109-146)/(46-75) 123/75 mmHg (08/24 1008) SpO2:  [97 %] 97 % (08/24 0605) Last BM Date: 12/26/11  Intake/Output from previous day: 08/23 0701 - 08/24 0700 In: 2766.7 [P.O.:840; I.V.:1626.7; IV Piggyback:300] Out: 3721 [Urine:3675; Drains:45; Stool:1] Intake/Output this shift:    PE:  Vss; 99.3 Wbc 11.3(13.5) No complaints Output 45 cc yesterday: yellow; thick fluid Cx still pending Site clean and dry  Lab Results:   Basename 12/27/11 0439 12/26/11 0340  WBC 11.3* 13.5*  HGB 10.5* 11.2*  HCT 31.1* 32.1*  PLT 300 274   BMET No results found for this basename: NA:2,K:2,CL:2,CO2:2,GLUCOSE:2,BUN:2,CREATININE:2,CALCIUM:2 in the last 72 hours PT/INR No results found for this basename: LABPROT:2,INR:2 in the last 72 hours ABG No results found for this basename: PHART:2,PCO2:2,PO2:2,HCO3:2 in the last 72 hours  Studies/Results: No results found.  Anti-infectives:   Assessment/Plan: s/p LLQ abscess drain placed 8/20 Will follow Need Re CT soon When output less than 10-15 cc /day  Judeth Gilles A 12/27/2011

## 2011-12-28 ENCOUNTER — Inpatient Hospital Stay (HOSPITAL_COMMUNITY): Payer: Medicare Other

## 2011-12-28 NOTE — Progress Notes (Signed)
Patient ID: [name] [name], male   DOB: 14-May-1935, 76 y.o.   MRN: [id number]    Subjective: Did not sleep well but no other complaints. Just mild soreness left lower quadrant a round drain site.  Objective: Vital signs in last 24 hours: Temp:  [98.1 F (36.7 C)-99.7 F (37.6 C)] 98.1 F (36.7 C) (08/25 0549) Pulse Rate:  [61-79] 79  (08/25 0549) Resp:  [18] 18  (08/25 0549) BP: (111-130)/(55-75) 127/71 mmHg (08/25 0958) SpO2:  [97 %-100 %] 98 % (08/25 0549) Last BM Date: 12/26/11  Intake/Output from previous day: 08/24 0701 - 08/25 0700 In: 3635 [P.O.:600; I.V.:2220; IV Piggyback:800] Out: 2016 [Urine:2000; Drains:15; Stool:1] Intake/Output this shift: Total I/O In: -  Out: 700 [Urine:700]  General appearance: alert and no distress GI: abnormal findings:  mild tenderness in the LLQ and drain with cloudy yellow fluid.  Lab Results:   Basename 12/27/11 0439 12/26/11 0340  WBC 11.3* 13.5*  HGB 10.5* 11.2*  HCT 31.1* 32.1*  PLT 300 274   BMET No results found for this basename: NA:2,K:2,CL:2,CO2:2,GLUCOSE:2,BUN:2,CREATININE:2,CALCIUM:2 in the last 72 hours   Studies/Results: No results found.  Anti-infectives: Anti-infectives     Start     Dose/Rate Route Frequency Ordered Stop   12/23/11 1000   ciprofloxacin (CIPRO) IVPB 400 mg        400 mg 200 mL/hr over 60 Minutes Intravenous Every 12 hours 12/23/11 0057     12/23/11 0400   metroNIDAZOLE (FLAGYL) IVPB 500 mg        500 mg 100 mL/hr over 60 Minutes Intravenous Every 8 hours 12/23/11 0057     12/22/11 1930   ciprofloxacin (CIPRO) IVPB 400 mg        400 mg 200 mL/hr over 60 Minutes Intravenous  Once 12/22/11 1928 12/22/11 2239   12/22/11 1930   metroNIDAZOLE (FLAGYL) IVPB 500 mg        500 mg 100 mL/hr over 60 Minutes Intravenous  Once 12/22/11 1928 12/22/11 2112          Assessment/Plan: peridiverticular abscess, status post percutaneous drainage. Seems to be gradually improving. Continue current  management. We will repeat CBC and CT scan tomorrow.     LOS: 6 days    Layan Zalenski T 12/28/2011

## 2011-12-29 ENCOUNTER — Inpatient Hospital Stay (HOSPITAL_COMMUNITY): Payer: Medicare Other

## 2011-12-29 DIAGNOSIS — K5732 Diverticulitis of large intestine without perforation or abscess without bleeding: Secondary | ICD-10-CM | POA: Diagnosis present

## 2011-12-29 DIAGNOSIS — IMO0002 Reserved for concepts with insufficient information to code with codable children: Secondary | ICD-10-CM | POA: Diagnosis present

## 2011-12-29 LAB — CBC
Hemoglobin: 11 g/dL — ABNORMAL LOW (ref 13.0–17.0)
MCHC: 34 g/dL (ref 30.0–36.0)
Platelets: 355 10*3/uL (ref 150–400)
RBC: 3.66 MIL/uL — ABNORMAL LOW (ref 4.22–5.81)
RDW: 13.3 % (ref 11.5–15.5)
WBC: 9.2 10*3/uL (ref 4.0–10.5)
WBC: 9.3 10*3/uL (ref 4.0–10.5)

## 2011-12-29 LAB — BASIC METABOLIC PANEL
CO2: 27 mEq/L (ref 19–32)
GFR calc non Af Amer: 71 mL/min — ABNORMAL LOW (ref >90)
Glucose, Bld: 133 mg/dL — ABNORMAL HIGH (ref 70–99)
Potassium: 3.9 mEq/L (ref 3.5–5.1)
Sodium: 133 mEq/L — ABNORMAL LOW (ref 135–145)

## 2011-12-29 MED ORDER — PIPERACILLIN-TAZOBACTAM 3.375 G IVPB
3.3750 g | Freq: Three times a day (TID) | INTRAVENOUS | Status: DC
  Administered 2011-12-29 – 2011-12-31 (×6): 3.375 g via INTRAVENOUS
  Filled 2011-12-29 (×7): qty 50

## 2011-12-29 MED ORDER — IOHEXOL 300 MG/ML  SOLN
100.0000 mL | Freq: Once | INTRAMUSCULAR | Status: AC | PRN
Start: 2011-12-29 — End: 2011-12-29
  Administered 2011-12-29: 100 mL via INTRAVENOUS

## 2011-12-29 MED ORDER — SIMVASTATIN 20 MG PO TABS
20.0000 mg | ORAL_TABLET | Freq: Every evening | ORAL | Status: DC
Start: 2011-12-29 — End: 2011-12-31
  Administered 2011-12-29 – 2011-12-30 (×2): 20 mg via ORAL
  Filled 2011-12-29 (×3): qty 1

## 2011-12-29 NOTE — Progress Notes (Signed)
Patient ID: HALLARD MCCULLAR, male   DOB: 01-Jul-1935, 76 y.o.   MRN: [id number] Patient ID: KIRON EGGUM, male   DOB: Nov 24, 1935, 76 y.o.   MRN: [id number]    Subjective: Only c/o is some mild soreness left lower quadrant around drain site. +BM, Flatus  Objective: Vital signs in last 24 hours: Temp:  [97.4 F (36.3 C)-98.3 F (36.8 C)] 98 F (36.7 C) (08/26 0555) Pulse Rate:  [60-85] 60  (08/26 0555) Resp:  [16-18] 16  (08/26 0555) BP: (107-145)/(60-69) 122/63 mmHg (08/26 0555) SpO2:  [96 %-98 %] 97 % (08/26 0555) Last BM Date: 12/28/11  Intake/Output from previous day: 08/25 0701 - 08/26 0700 In: 3175 [I.V.:2460; IV Piggyback:700] Out: 3230 [Urine:3225; Drains:5] Intake/Output this shift: Total I/O In: 400 [I.V.:400] Out: 700 [Urine:700]  General appearance: alert and no distress GI: abnormal findings:  mild tenderness in the LLQ and drain with cloudy yellow fluid. WBC wnl, afebrile. (day 7 of abx) CT scan done: 8/26:  Resolution of previously identified diverticular abscess within the  sigmoid mesocolon post percutaneous drainage.  Interval development of a new abscess collection within the sigmoid  mesocolon, adjacent to the left lateral aspect of the distal  sigmoid colon, 4.2 x 1.9 x 1.9 cm.  Persistent changes of sigmoid diverticulitis.  Remainder of exam unchanged.   Lab Results:   Basename 12/29/11 0423 12/27/11 0439  WBC 9.2 11.3*  HGB 10.7* 10.5*  HCT 31.1* 31.1*  PLT 355 300   BMET No results found for this basename: NA:2,K:2,CL:2,CO2:2,GLUCOSE:2,BUN:2,CREATININE:2,CALCIUM:2 in the last 72 hours   Studies/Results: Ct Abdomen Pelvis W Contrast  12/29/2011  *RADIOLOGY REPORT*  Clinical Data: Follow-up diverticular abscess  CT ABDOMEN AND PELVIS WITH CONTRAST  Technique:  Multidetector CT imaging of the abdomen and pelvis was performed following the standard protocol during bolus administration of intravenous contrast. Sagittal and coronal MPR images  reconstructed from axial data set.  Contrast: 129mL OMNIPAQUE IOHEXOL 300 MG/ML  SOLN Dilute oral contrast.  Comparison: 12/23/2011, 12/22/2011  Findings: Bibasilar atelectasis. Pacemaker lead right ventricle. Small nodule right lung base stable. No dilatation of pancreatic duct 6 mm diameter. Metallic foreign body right lobe liver image 23. Tiny left renal cyst image 18. Liver, spleen, pancreas, kidneys, and adrenal glands otherwise normal appearance.  Pigtail drainage catheter at sigmoid mesocolon post drainage of a diverticular abscess. No residual fluid collection identified at that site. An additional fluid collection is now identified adjacent to the left lateral aspect of the distal sigmoid colon measuring 4.2 x 1.9 cm image 58 and extending 1.9 cm cranial-caudal consistent with additional abscess collection. Scattered stranding of regional soft tissue planes. Mild bowel wall thickening and diverticula of proximal sigmoid colon again identified. No extraluminal gas collections seen.  Normal appendix. Question mild rectal wall thickening versus artifact from underdistension. Stomach and bowel loops otherwise normal appearance. No mass, adenopathy, free air, or free fluid. Thickened bladder wall unchanged with seed implants within prostate bed. Old posterior lower left rib fractures. No acute osseous findings.  IMPRESSION: Resolution of previously identified diverticular abscess within the sigmoid mesocolon post percutaneous drainage. Interval development of a new abscess collection within the sigmoid mesocolon, adjacent to the left lateral aspect of the distal sigmoid colon, 4.2 x 1.9 x 1.9 cm. Persistent changes of sigmoid diverticulitis.  Remainder of exam unchanged.   Original Report Authenticated By: Burnetta Sabin, M.D.     Anti-infectives: Anti-infectives     Start     Dose/Rate Route Frequency Ordered  Stop   12/23/11 1000   ciprofloxacin (CIPRO) IVPB 400 mg        400 mg 200 mL/hr over 60  Minutes Intravenous Every 12 hours 12/23/11 0057     12/23/11 0400   metroNIDAZOLE (FLAGYL) IVPB 500 mg        500 mg 100 mL/hr over 60 Minutes Intravenous Every 8 hours 12/23/11 0057     12/22/11 1930   ciprofloxacin (CIPRO) IVPB 400 mg        400 mg 200 mL/hr over 60 Minutes Intravenous  Once 12/22/11 1928 12/22/11 2239   12/22/11 1930   metroNIDAZOLE (FLAGYL) IVPB 500 mg        500 mg 100 mL/hr over 60 Minutes Intravenous  Once 12/22/11 1928 12/22/11 2112          Assessment/Plan: peridiverticular abscess, status post percutaneous drainage. Seems to be gradually improving. Continue current management.  Will consider advance diet to low fiber (patient had been on full liquids) after discussion with M.D. In light of CT findings.   LOS: 7 days    Nava Song 12/29/2011

## 2011-12-29 NOTE — Progress Notes (Signed)
ANTIBIOTIC CONSULT NOTE - INITIAL  Pharmacy Consult for Zosyn Indication: Peridiverticular abscess  No Known Allergies  Patient Measurements: Height: 5\' 7"  (170.2 cm) Weight: 147 lb 11.2 oz (66.996 kg) IBW/kg (Calculated) : 66.1    Vital Signs: Temp: 98 F (36.7 C) (08/26 0555) Temp src: Oral (08/26 0555) BP: 122/63 mmHg (08/26 0555) Pulse Rate: 60  (08/26 0555) Intake/Output from previous day: 08/25 0701 - 08/26 0700 In: [id number] [I.V.:2460; IV Piggyback:700] Out: [id number] [Urine:3225; Drains:5] Intake/Output from this shift: Total I/O In: 400 [I.V.:400] Out: 700 [Urine:700]  Labs:  [facility] 12/29/11 0423 12/27/11 0439  WBC 9.2 11.3*  HGB 10.7* 10.5*  PLT 355 300  LABCREA -- --  CREATININE -- --   Estimated Creatinine Clearance: 40.7 ml/min (by C-G formula based on Cr of 1.51). No results found for this basename: VANCOTROUGH:2,VANCOPEAK:2,VANCORANDOM:2,GENTTROUGH:2,GENTPEAK:2,GENTRANDOM:2,TOBRATROUGH:2,TOBRAPEAK:2,TOBRARND:2,AMIKACINPEAK:2,AMIKACINTROU:2,AMIKACIN:2, in the last 72 hours   Microbiology: Recent Results (from the past 720 hour(s))  CULTURE, ROUTINE-ABSCESS     Status: Normal   Collection Time   12/23/11  1:50 PM      Component Value Range Status Comment   Specimen Description ABSCESS DRAIN   Final    Special Requests NONE   Final    Gram Stain     Final    Value: ABUNDANT WBC PRESENT,BOTH PMN AND MONONUCLEAR     NO SQUAMOUS EPITHELIAL CELLS SEEN     ABUNDANT GRAM POSITIVE COCCI IN PAIRS     ABUNDANT GRAM NEGATIVE RODS     FEW GRAM POSITIVE RODS   Culture MODERATE STREPTOCOCCUS GROUP F   Final    Report Status 12/27/2011 FINAL   Final     Medical History: Past Medical History  Diagnosis Date  . Prostate cancer   . Atrial flutter   . Hypertension 04/30/11    Cardioversion 06/16/11  . Hyperlipemia   . Pacemaker      Assessment: 81 yoM w/ peridiverticular abscess, s/p percutaneous drainage 8/20.  Pt has received 8 days of IV ciprofloxacin and  metronidazole, now to change to zosyn.  Per NP note, pt seems to be gradually improving.   8/20 Abscess culture:  Moderate streptococcus group F  SCr 1.51 (8/21), CrCl ~44 (N), 41 (CCG)  Afebrile  WBC WNL  Plan:  1.  Zosyn 3.375g IV q 8 hours (4 hour infusion) 2.  BMET 8/27 - f/u renal fxn  Kemarion Abbey E 12/29/2011,1:24 PM

## 2011-12-29 NOTE — Progress Notes (Signed)
CT with 4x2x2 cmpelvis collection - I thin it should be drained.  D/w IR MD (Dr. Pascal Lux) & he will see can be done Proc adv diet thereafter

## 2011-12-30 ENCOUNTER — Encounter (HOSPITAL_COMMUNITY): Payer: Self-pay | Admitting: General Surgery

## 2011-12-30 DIAGNOSIS — E785 Hyperlipidemia, unspecified: Secondary | ICD-10-CM

## 2011-12-30 DIAGNOSIS — C61 Malignant neoplasm of prostate: Secondary | ICD-10-CM | POA: Diagnosis present

## 2011-12-30 HISTORY — DX: Malignant neoplasm of prostate: C61

## 2011-12-30 HISTORY — DX: Hyperlipidemia, unspecified: E78.5

## 2011-12-30 LAB — CBC
Hemoglobin: 10.8 g/dL — ABNORMAL LOW (ref 13.0–17.0)
MCH: 29.8 pg (ref 26.0–34.0)
RBC: 3.62 MIL/uL — ABNORMAL LOW (ref 4.22–5.81)

## 2011-12-30 LAB — BASIC METABOLIC PANEL
CO2: 26 mEq/L (ref 19–32)
Calcium: 8.5 mg/dL (ref 8.4–10.5)
Glucose, Bld: 108 mg/dL — ABNORMAL HIGH (ref 70–99)
Sodium: 136 mEq/L (ref 135–145)

## 2011-12-30 MED ORDER — SODIUM CHLORIDE 0.9 % IV SOLN
INTRAVENOUS | Status: DC
  Administered 2011-12-30 – 2011-12-31 (×2): via INTRAVENOUS

## 2011-12-30 NOTE — Progress Notes (Signed)
Subjective: Pt ok. Having BMs and voiding without pain. Follow up CT showed essential resolution of abscess post drainage It also showed smaller new collection that was not felt amenable to perc drainage.  Objective: Physical Exam: BP 114/60  Pulse 74  Temp 98.2 F (36.8 C) (Oral)  Resp 18  Ht 5\' 7"  (1.702 m)  Wt 147 lb 11.2 oz (66.996 kg)  BMI 23.13 kg/m2  SpO2 97% Drain intact, site clean, NT Output still yellowish brown but scant.   Labs: CBC  Basename 12/30/11 0355 12/29/11 1441  WBC 8.5 9.3  HGB 10.8* 11.0*  HCT 32.3* 32.4*  PLT 347 362   BMET  Basename 12/30/11 0355 12/29/11 1441  NA 136 133*  K 4.0 3.9  CL 102 99  CO2 26 27  GLUCOSE 108* 133*  BUN 11 7  CREATININE 1.38* 1.02  CALCIUM 8.5 8.7   LFT No results found for this basename: PROT,ALBUMIN,AST,ALT,ALKPHOS,BILITOT,BILIDIR,IBILI,LIPASE in the last 72 hours PT/INR No results found for this basename: LABPROT:2,INR:2 in the last 72 hours   Studies/Results: Ct Abdomen Pelvis W Contrast  12/29/2011  *RADIOLOGY REPORT*  Clinical Data: Follow-up diverticular abscess  CT ABDOMEN AND PELVIS WITH CONTRAST  Technique:  Multidetector CT imaging of the abdomen and pelvis was performed following the standard protocol during bolus administration of intravenous contrast. Sagittal and coronal MPR images reconstructed from axial data set.  Contrast: 112mL OMNIPAQUE IOHEXOL 300 MG/ML  SOLN Dilute oral contrast.  Comparison: 12/23/2011, 12/22/2011  Findings: Bibasilar atelectasis. Pacemaker lead right ventricle. Small nodule right lung base stable. No dilatation of pancreatic duct 6 mm diameter. Metallic foreign body right lobe liver image 23. Tiny left renal cyst image 18. Liver, spleen, pancreas, kidneys, and adrenal glands otherwise normal appearance.  Pigtail drainage catheter at sigmoid mesocolon post drainage of a diverticular abscess. No residual fluid collection identified at that site. An additional fluid collection  is now identified adjacent to the left lateral aspect of the distal sigmoid colon measuring 4.2 x 1.9 cm image 58 and extending 1.9 cm cranial-caudal consistent with additional abscess collection. Scattered stranding of regional soft tissue planes. Mild bowel wall thickening and diverticula of proximal sigmoid colon again identified. No extraluminal gas collections seen.  Normal appendix. Question mild rectal wall thickening versus artifact from underdistension. Stomach and bowel loops otherwise normal appearance. No mass, adenopathy, free air, or free fluid. Thickened bladder wall unchanged with seed implants within prostate bed. Old posterior lower left rib fractures. No acute osseous findings.  IMPRESSION: Resolution of previously identified diverticular abscess within the sigmoid mesocolon post percutaneous drainage. Interval development of a new abscess collection within the sigmoid mesocolon, adjacent to the left lateral aspect of the distal sigmoid colon, 4.2 x 1.9 x 1.9 cm. Persistent changes of sigmoid diverticulitis.  Remainder of exam unchanged.   Original Report Authenticated By: Burnetta Sabin, M.D.     Assessment/Plan: Diverticular abscess, now clinically suspicious for fistula  S/p perc drain 8/20-resolving by recent follow up CT [facility] plans noted for home with drain soon.      LOS: 8 days    Ascencion Dike PA-C 12/30/2011 9:42 AM

## 2011-12-30 NOTE — Progress Notes (Signed)
Feeling better Cr elevated a little UOP OK Prob home tomorrow on PO ABx w outpt drain/PExam followup

## 2011-12-30 NOTE — Progress Notes (Signed)
Subjective: He feels better it doesn't hurt to have a BM now.  Abdomen is not tender now. He does have yellow-brownish-cloudy fluid from current drain, nothing recorded.  Objective: Vital signs in last 24 hours: Temp:  [98 F (36.7 C)-98.3 F (36.8 C)] 98.2 F (36.8 C) (08/27 0554) Pulse Rate:  [62-74] 74  (08/27 0554) Resp:  [16-20] 18  (08/27 0554) BP: (101-139)/(52-76) 139/76 mmHg (08/27 0554) SpO2:  [96 %-97 %] 97 % (08/27 0554) Last BM Date: 12/28/11  Nothing recorded from drain yesterday, 600 ml PO, no BM recorded, Diet:  Low fiber.afebrile, VSS,WBC is stable, variable creatinine.  Intake/Output from previous day: 08/26 0701 - 08/27 0700 In: 1400 [P.O.:600; I.V.:800] Out: 2425 [Urine:2425] Intake/Output this shift:    General appearance: alert, cooperative and no distress GI: soft, non-tender; bowel sounds normal; no masses,  no organomegaly and drain site is OK, he has fluid that is yellow-brown-cloudy in appearance.  Lab Results:   Basename 12/30/11 0355 12/29/11 1441  WBC 8.5 9.3  HGB 10.8* 11.0*  HCT 32.3* 32.4*  PLT 347 362    BMET  Basename 12/30/11 0355 12/29/11 1441  NA 136 133*  K 4.0 3.9  CL 102 99  CO2 26 27  GLUCOSE 108* 133*  BUN 11 7  CREATININE 1.38* 1.02  CALCIUM 8.5 8.7   PT/INR No results found for this basename: LABPROT:2,INR:2 in the last 72 hours  No results found for this basename: AST:5,ALT:5,ALKPHOS:5,BILITOT:5,PROT:5,ALBUMIN:5 in the last 168 hours   Lipase  No results found for this basename: lipase     Studies/Results: Ct Abdomen Pelvis W Contrast  12/29/2011  *RADIOLOGY REPORT*  Clinical Data: Follow-up diverticular abscess  CT ABDOMEN AND PELVIS WITH CONTRAST  Technique:  Multidetector CT imaging of the abdomen and pelvis was performed following the standard protocol during bolus administration of intravenous contrast. Sagittal and coronal MPR images reconstructed from axial data set.  Contrast: 165mL OMNIPAQUE IOHEXOL  300 MG/ML  SOLN Dilute oral contrast.  Comparison: 12/23/2011, 12/22/2011  Findings: Bibasilar atelectasis. Pacemaker lead right ventricle. Small nodule right lung base stable. No dilatation of pancreatic duct 6 mm diameter. Metallic foreign body right lobe liver image 23. Tiny left renal cyst image 18. Liver, spleen, pancreas, kidneys, and adrenal glands otherwise normal appearance.  Pigtail drainage catheter at sigmoid mesocolon post drainage of a diverticular abscess. No residual fluid collection identified at that site. An additional fluid collection is now identified adjacent to the left lateral aspect of the distal sigmoid colon measuring 4.2 x 1.9 cm image 58 and extending 1.9 cm cranial-caudal consistent with additional abscess collection. Scattered stranding of regional soft tissue planes. Mild bowel wall thickening and diverticula of proximal sigmoid colon again identified. No extraluminal gas collections seen.  Normal appendix. Question mild rectal wall thickening versus artifact from underdistension. Stomach and bowel loops otherwise normal appearance. No mass, adenopathy, free air, or free fluid. Thickened bladder wall unchanged with seed implants within prostate bed. Old posterior lower left rib fractures. No acute osseous findings.  IMPRESSION: Resolution of previously identified diverticular abscess within the sigmoid mesocolon post percutaneous drainage. Interval development of a new abscess collection within the sigmoid mesocolon, adjacent to the left lateral aspect of the distal sigmoid colon, 4.2 x 1.9 x 1.9 cm. Persistent changes of sigmoid diverticulitis.  Remainder of exam unchanged.   Original Report Authenticated By: Burnetta Sabin, M.D.     Medications:    . amLODipine  10 mg Oral Daily  . hydrALAZINE  25 mg Oral TID  . irbesartan  300 mg Oral Daily   And  . hydrochlorothiazide  12.5 mg Oral Daily  . piperacillin-tazobactam (ZOSYN)  IV  3.375 g Intravenous Q8H  . simvastatin  20  mg Oral QPM  . Tamsulosin HCl  0.4 mg Oral BID  . DISCONTD: ciprofloxacin  400 mg Intravenous Q12H  . DISCONTD: metronidazole  500 mg Intravenous Q8H  . DISCONTD: pantoprazole (PROTONIX) IV  40 mg Intravenous QHS    Assessment/Plan Sigmoid Diverticulitis with peridiverticular abscess, status post percutaneous drainage 12/23/11 in IR DR. Hassell. Culture grew Streptococcus F Repeat Ct scan yesterday shows a new collection 4 x2 x 2 cm in the pelvis that cannot be drained per IR. Hx of atrial flutter//DC cardioversion 06/16/11/hx of cardiac AF ablation & PTVP placement, 11/28/11 Dr. Einar Gip and Dr. Rayann Heman. With hx of syncope and collapse. Hypertension Dyslipidemia Prostate CA with seed implants.   Plan:  Feels much better, WBC is stable, he keeps having rises in his creatinine.  Will discuss with Dr. Johney Maine. Increase IV fluids continue antibiotics, recheck labs in AM.  Home with drain tomorrow if labs and pt continue to improve.  LOS: 8 days    Alois Mincer 12/30/2011

## 2011-12-30 NOTE — Discharge Summary (Signed)
Physician Discharge Summary  Patient ID: [name] [name] MRN: [id number] DOB/AGE: 74-Mar-1937 74 y.o.  Admit date: 12/22/2011 Discharge date: 12/31/2011  Admission Diagnoses: Pericolonic abscess, likely secondary to diverticulitis Hx of atrial flutter//DC cardioversion 06/16/11/hx of cardiac AF ablation & PTVP placement, 11/28/11 Dr. Einar Gip and Dr. Rayann Heman. With hx of syncope and collapse.  Hypertension  Dyslipidemia  Prostate CA with seed implants.   Discharge Diagnoses: Sigmoid Diverticulitis with peridiverticular abscess, status post percutaneous drainage 12/23/11 in IR DR. Hassell. Culture grew Streptococcus F  Repeat Ct scan yesterday shows a new collection 4 x2 x 2 cm in the pelvis that cannot be drained per IR.  Hx of atrial flutter//DC cardioversion 06/16/11/hx of cardiac AF ablation & PTVP placement, 11/28/11 Dr. Einar Gip and Dr. Rayann Heman. With hx of syncope and collapse.  Hypertension  Dyslipidemia  Prostate CA with seed implants.  Renal Insuffiencey with Some elevation of creatinine up and down during hospitalization  Principal Problem:  *Atrial flutter with controlled response Active Problems:  Diverticulitis of sigmoid colon  Abscess, abdomen  Essential hypertension, benign  Renal insufficiency  Dyslipidemia  Carcinoma of prostate   PROCEDURES: Status Post percutaneous drainage 12/23/11 in IR DR. Hassell.   Hospital Course: patient is a very pleasant 76 year old male states that 4 days ago he developed the fairly sudden onset of left lower quadrant abdominal pain. The pain has been persistent and constant and gradually worsening ever since that time. There is no radiation. It gets a little better after urination or bowel movements. He felt chilled today but no fever. He has been nauseated without vomiting. The patient had a pacemaker placed for sick sinus syndrome several weeks ago and he states the pain medication constipated him and he has been having to strain at bowel movements.  He has no previous history of any significant GI problems or diverticulitis. He has had no previous abdominal surgery. There is no melena or hematochezia.  The patient has a history of prostate cancer followed by Dr. Janice Norrie. He therefore presented to his office today to be evaluated. A CT scan was obtained which has revealed a 3.5 x 4.8 cm pericolonic abscess adjacent to the sigmoid colon felt to be most likely secondary to sigmoid diverticulitis. Also noted or healing left posterior 10th and 11th rib fractures which were known due to a recent fall and syncope that prompted placement of his pacemaker. With this finding the patient was sent to the emergency room for evaluation and treatment and we were called Pt was seen and admitted by Dr. Excell Seltzer, and placed on IV antibiotics.  He was seen by IR and percutaneous drain was placed.  Repeat Ct obtained showed resolution of the abscess post drainage, but a new smaller collection was found adjacent to the left lateral aspect aspect of the distal sigmoid colon. This was not amenable to drainage by IR   After his 2nd CT creatinine did infact go up again.  We hydrated him, his creatinine is stable at 1.37.  He bumped up to 1.51 after his first CT and went back down to 1/02 after that.  His abdomen is stable, he has no fever and WBC is normal. His drainage has been a yellow-brown-cloudy, and we had some concern over a fistula, but nothing seen on CT at this point.  He would like to go home and Dr. Johney Maine is in agreement.  We will send him home on Augmentin for 1 week.  Follow up with primary care to check BP and renal  function  and Dr. Harlow Asa next week to follow up on drain and possible repeat CT  Condition on D/C: stable      Disposition: 01-Home or Self Care  Discharge Orders    Future Appointments: Provider: Department: Dept Phone: Center:   03/17/2012 2:45 PM Thompson Grayer, MD [place] [phone] [id number]     Medication List  As of  12/31/2011 11:35 AM   TAKE these medications         acetaminophen 325 MG tablet   Commonly known as: TYLENOL   Take 2 tablets (650 mg total) by mouth every 4 (four) hours as needed.      amLODipine 10 MG tablet   Commonly known as: NORVASC   Take 10 mg by mouth daily.      amoxicillin-clavulanate 875-125 MG per tablet   Commonly known as: AUGMENTIN   Take 1 tablet by mouth every 12 (twelve) hours.      aspirin 81 MG chewable tablet   Chew 81 mg by mouth daily.      hydrALAZINE 25 MG tablet   Commonly known as: APRESOLINE   Take 25 mg by mouth 3 (three) times daily.      HYDROcodone-acetaminophen 5-325 MG per tablet   Commonly known as: NORCO/VICODIN   Take 1 tablet by mouth every 6 (six) hours as needed.      HYDROcodone-acetaminophen 5-325 MG per tablet   Commonly known as: NORCO/VICODIN   Take 1 tablet by mouth every 6 (six) hours as needed.      olmesartan-hydrochlorothiazide 40-12.5 MG per tablet   Commonly known as: BENICAR HCT   Take 1 tablet by mouth daily.      simvastatin 20 MG tablet   Commonly known as: ZOCOR   Take 20 mg by mouth every evening.      Tamsulosin HCl 0.4 MG Caps   Commonly known as: FLOMAX   Take 0.4 mg by mouth 2 (two) times daily.           Follow-up Information    Follow up with [facility]. (RN for drain care,  irrigate with sterile, NS 5 ml daily.  Record daily ourput and color.)    Contact information:   [place] [phone]      Follow up with Earnstine Regal, MD. Schedule an appointment as soon as possible for a visit in 1 week. (Follow up in clinic, bring record of drainage with you to office.)    Contact information:   [place] [phone]       Follow up with Philis Fendt, MD. Schedule an appointment as soon as possible for a visit in 1 week. (Plan to have labs and kidney function checked next week.)    Contact  information:   [place] [phone]          Signed: Earnstine Regal 12/31/2011, 11:35 AM

## 2011-12-31 DIAGNOSIS — N289 Disorder of kidney and ureter, unspecified: Secondary | ICD-10-CM | POA: Clinically undetermined

## 2011-12-31 LAB — CBC
HCT: 31.7 % — ABNORMAL LOW (ref 39.0–52.0)
Hemoglobin: 10.9 g/dL — ABNORMAL LOW (ref 13.0–17.0)
MCH: 30.4 pg (ref 26.0–34.0)
MCV: 88.3 fL (ref 78.0–100.0)
Platelets: 390 10*3/uL (ref 150–400)
RBC: 3.59 MIL/uL — ABNORMAL LOW (ref 4.22–5.81)

## 2011-12-31 LAB — COMPREHENSIVE METABOLIC PANEL
ALT: 8 U/L (ref 0–53)
AST: 17 U/L (ref 0–37)
Calcium: 8.4 mg/dL (ref 8.4–10.5)
Sodium: 136 mEq/L (ref 135–145)
Total Protein: 5.8 g/dL — ABNORMAL LOW (ref 6.0–8.3)

## 2011-12-31 MED ORDER — SODIUM CHLORIDE 0.9 % IJ SOLN: 3.0000 mL | Freq: Two times a day (BID) | INTRAMUSCULAR | Status: DC

## 2011-12-31 MED ORDER — ACETAMINOPHEN 325 MG PO TABS: 650.0000 mg | ORAL_TABLET | ORAL | Status: DC | PRN

## 2011-12-31 MED ORDER — SODIUM CHLORIDE 0.9 % IJ SOLN: 3.0000 mL | INTRAMUSCULAR | Status: DC | PRN

## 2011-12-31 MED ORDER — LACTATED RINGERS IV BOLUS (SEPSIS): 1000.0000 mL | Freq: Three times a day (TID) | INTRAVENOUS | Status: DC | PRN

## 2011-12-31 MED ORDER — HYDROCODONE-ACETAMINOPHEN 5-325 MG PO TABS: 1.0000 | ORAL_TABLET | Freq: Four times a day (QID) | ORAL | Status: DC | PRN

## 2011-12-31 MED ORDER — AMOXICILLIN-POT CLAVULANATE 875-125 MG PO TABS
1.0000 | ORAL_TABLET | Freq: Two times a day (BID) | ORAL | Status: DC
  Filled 2011-12-31 (×2): qty 1

## 2011-12-31 MED ORDER — AMOXICILLIN-POT CLAVULANATE 875-125 MG PO TABS: 1.0000 | ORAL_TABLET | Freq: Two times a day (BID) | ORAL | Status: AC

## 2011-12-31 NOTE — Discharge Instructions (Signed)
Diverticulitis A diverticulum is a small pouch or sac on the colon. Diverticulosis is the presence of these diverticula on the colon. Diverticulitis is the irritation (inflammation) or infection of diverticula. CAUSES  The colon and its diverticula contain bacteria. If food particles block the tiny opening to a diverticulum, the bacteria inside can grow and cause an increase in pressure. This leads to infection and inflammation and is called diverticulitis. SYMPTOMS   Abdominal pain and tenderness. Usually, the pain is located on the left side of your abdomen. However, it could be located elsewhere.   Fever.   Bloating.   Feeling sick to your stomach (nausea).   Throwing up (vomiting).   Abnormal stools.  DIAGNOSIS  Your caregiver will take a history and perform a physical exam. Since many things can cause abdominal pain, other tests may be necessary. Tests may include:  Blood tests.   Urine tests.   X-ray of the abdomen.   CT scan of the abdomen.  Sometimes, surgery is needed to determine if diverticulitis or other conditions are causing your symptoms. TREATMENT  Most of the time, you can be treated without surgery. Treatment includes:  Resting the bowels by only having liquids for a few days. As you improve, you will need to eat a low-fiber diet.   Intravenous (IV) fluids if you are losing body fluids (dehydrated).   Antibiotic medicines that treat infections may be given.   Pain and nausea medicine, if needed.   Surgery if the inflamed diverticulum has burst.  HOME CARE INSTRUCTIONS   Try a clear liquid diet (broth, tea, or water for as long as directed by your caregiver). You may then gradually begin a low-fiber diet as tolerated. A low-fiber diet is a diet with less than 10 grams of fiber. Choose the foods below to reduce fiber in the diet:   White breads, cereals, rice, and pasta.   Cooked fruits and vegetables or soft fresh fruits and vegetables without the skin.     Ground or well-cooked tender beef, ham, veal, lamb, pork, or poultry.   Eggs and seafood.   After your diverticulitis symptoms have improved, your caregiver may put you on a high-fiber diet. A high-fiber diet includes 14 grams of fiber for every 1000 calories consumed. For a standard 2000 calorie diet, you would need 28 grams of fiber. Follow these diet guidelines to help you increase the fiber in your diet. It is important to slowly increase the amount fiber in your diet to avoid gas, constipation, and bloating.   Choose whole-grain breads, cereals, pasta, and brown rice.   Choose fresh fruits and vegetables with the skin on. Do not overcook vegetables because the more vegetables are cooked, the more fiber is lost.   Choose more nuts, seeds, legumes, dried peas, beans, and lentils.   Look for food products that have greater than 3 grams of fiber per serving on the Nutrition Facts label.   Take all medicine as directed by your caregiver.   If your caregiver has given you a follow-up appointment, it is very important that you go. Not going could result in lasting (chronic) or permanent injury, pain, and disability. If there is any problem keeping the appointment, call to reschedule.  SEEK MEDICAL CARE IF:   Your pain does not improve.   You have a hard time advancing your diet beyond clear liquids.   Your bowel movements do not return to normal.  SEEK IMMEDIATE MEDICAL CARE IF:   Your pain becomes  worse.   You have an oral temperature above 102 F (38.9 C), not controlled by medicine.   You have repeated vomiting.   You have bloody or black, tarry stools.   Symptoms that brought you to your caregiver become worse or are not getting better.   Increasing drainage, bad odor, or  if color of drainage changes. MAKE SURE YOU:   Understand these instructions.   Will watch your condition.   Will get help right away if you are not doing well or get worse.  Document Released:  01/29/2005 Document Revised: 04/10/2011 Document Reviewed: 05/27/2010 [facility] Patient Information 2012 [place].  Low Fiber and Residue Restricted Diet A low fiber diet restricts foods that contain carbohydrates that are not digested in the small intestine. A diet containing about 10 g of fiber is considered low fiber. The diet needs to be individualized to suit patient tolerances and preferences and to avoid unnecessary restrictions. Generally, the foods emphasized in a low fiber diet have no skins or seeds. They may have been processed to remove bran, germ, or husks. Cooking may not necessarily eliminate the fiber. Cooking may, in fact, enable a greater quantity of fiber to be consumed in a lesser volume. Legumes and nuts are also restricted. The term low residue has also been used to describe low fiber diets, although the two are not the same. Residue refers to any substance that adds to bowel (colonic) contents, such as sloughed cells and intestinal bacteria, in addition to fiber. Residue-containing foods, prunes and prune juice, milk, and connective tissue from meats may also need to be eliminated. It is important to eliminate these foods during sudden (acute) attacks of inflammatory bowel disease, when there is a partial obstruction due to another reason, or when minimal fecal output is desired. When these problems are gone, a more normal diet may be used. PURPOSE  Prevent blockage of a partially obstructed or narrowed gastrointestinal tract.   Reduce stool weight and volume.   Slow the movement of waste.  WHEN IS THIS DIET USED?  Acute phase of Crohn's disease, ulcerative colitis, regional enteritis, or diverticulitis.   Narrowing (stenosis) of intestinal or esophageal tubes (lumina).   Transitional diet following surgery, injury (trauma), or illness.  ADEQUACY This diet is nutritionally adequate based on individual food choices according to the Recommended Dietary Allowances of  the [employer]. CHOOSING FOODS Check labels, especially on foods from the starch list. Often, dietary fiber content is listed with the Nutrition Facts panel.  Breads and Starches  Allowed: White, [place], and pita breads, plain rolls, buns, or sweet rolls, doughnuts, waffles, pancakes, bagels. Plain muffins, sweet breads, biscuits, matzoth. Flour. Soda, saltine, or graham crackers. Pretzels, rusks, melba toast, zwieback. Cooked cereals: cornmeal, far[place], cream cereals. Dry cereals: refined corn, wheat, rice, and oat cereals (check label). Potatoes prepared any way without skins, refined macaroni, spaghetti, noodles, refined rice.   Avoid: Bread, rolls, or crackers made with whole-wheat, multigrains, rye, bran seeds, nuts, or coconut. Corn tortillas, table-shells. Corn chips, tortilla chips. Cereals containing whole-grains, multigrains, bran, coconut, nuts, or raisins. Cooked or dry oatmeal. Coarse wheat cereals, granola. Cereals advertised as "high fiber." Potato skins. Whole-grain pasta, wild or brown rice. Popcorn.  Vegetables  Allowed:  Strained tomato and vegetable juices. Fresh: tender lettuce, cucumber, cabbage, spinach, bean sprouts. Cooked, canned: asparagus, bean sprouts, cut green or wax beans, cauliflower, pumpkin, beets, mushrooms, olives, spinach, yellow squash, tomato, tomato sauce (no seeds), zucchini (peeled), turnips. Canned sweet potatoes. Small  amounts of celery, onion, radish, and green pepper may be used. Keep servings limited to  cup.   Avoid: Fresh, cooked, or canned: artichokes, baked beans, beet greens, broccoli, Brussels sprouts, French-style green beans, corn, kale, legumes, peas, sweet potatoes. Cooked: green or red cabbage, spinach. Avoid large servings of any vegetables.  Fruit  Allowed:  All fruit juices except prune juice. Cooked or canned: apricots applesauce, cantaloupe, cherries, grapefruit, grapes, kiwi, mandarin oranges, peaches, pears, fruit  cocktail, pineapple, plums, watermelon. Fresh: banana, grapes, cantaloupe, avocado, cherries, pineapple, grapefruit, kiwi, nectarines, peaches, oranges, blueberries, plums. Keep servings limited to  cup or 1 piece.   Avoid: Fresh: apple with or without skin, apricots, mango, pears, raspberries, strawberries. Prune juice, stewed or dried prunes. Dried fruits, raisins, dates. Avoid large servings of all fresh fruits.  Meat and Meat Substitutes  Allowed:  Ground or well-cooked tender beef, ham, veal, lamb, pork, or poultry. Eggs, plain cheese. Fish, oysters, shrimp, lobster, other seafood. Liver, organ meats.   Avoid: Tough, fibrous meats with gristle. Peanut butter, smooth or chunky. Cheese with seeds, nuts, or other foods not allowed. Nuts, seeds, legumes, dried peas, beans, lentils.  Milk  Allowed:  All milk products except those not allowed. Milk and milk product consumption should be minimal when low residue is desired.   Avoid: Yogurt that contains nuts or seeds.  Soups and Combination Foods  Allowed:  Bouillon, broth, or cream soups made from allowed foods. Any strained soup. Casseroles or mixed dishes made with allowed foods.   Avoid: Soups made from vegetables that are not allowed or that contain other foods not allowed.  Desserts and Sweets  Allowed:  Plain cakes and cookies, pie made with allowed fruit, pudding, custard, cream pie. Gelatin, fruit, ice, sherbet, frozen ice pops. Ice cream, ice milk without nuts. Plain hard candy, honey, jelly, molasses, syrup, sugar, chocolate syrup, gumdrops, marshmallows.   Avoid: Desserts, cookies, or candies that contain nuts, peanut butter, or dried fruits. Jams, preserves with seeds, marmalade.  Fats and Oils  Allowed:  Margarine, butter, cream, mayonnaise, salad oils, plain salad dressings made from allowed foods. Plain gravy, crisp bacon without rind.   Avoid: Seeds, nuts, olives. Avocados.  Beverages  Allowed:  All, except those listed  to avoid.   Avoid: Fruit juices with high pulp, prune juice.  Condiments  Allowed:  Ketchup, mustard, horseradish, vinegar, cream sauce, cheese sauce, cocoa powder. Spices in moderation: allspice, basil, bay leaves, celery powder or leaves, cinnamon, cumin powder, curry powder, ginger, mace, marjoram, onion or garlic powder, oregano, paprika, parsley flakes, ground pepper, rosemary, sage, savory, tarragon, thyme, turmeric.   Avoid: Coconut, pickles.  SAMPLE MEAL PLAN The following menu is provided as a sample. Your daily menu plans will vary. Be sure to include a minimum of the following each day in order to provide essential nutrients for the adult:  Starch/Bread/Cereal Group, 6 servings.   Fruit/Vegetable Group, 5 servings.   Meat/Meat Substitute Group, 2 servings.   Milk/Milk Substitute Group, 2 servings.  A serving is equal to  cup for fruits, vegetables, and cooked cereals or 1 piece for foods such as a piece of bread, 1 orange, or 1 apple. For dry cereals and crackers, use serving sizes listed on the label. Combination foods may count as full or partial servings from various food groups. Fats, desserts, and sweets may be added to the meal plan after the requirements for essential nutrients are met. SAMPLE MENU Breakfast   cup orange juice.  1 boiled egg.   1 slice white toast.   Margarine.    cup cornflakes.   1 cup milk.   Beverage.  Lunch   cup chicken noodle soup.   2 to 3 oz sliced roast beef.   2 slices seedless rye bread.   Mayonnaise.    cup tomato juice.   1 small banana.   Beverage.  Dinner  3 oz baked chicken.    cup scalloped potatoes.    cup cooked beets.   White dinner roll.   Margarine.    cup canned peaches.   Beverage.  Document Released: 10/11/2001 Document Revised: 04/10/2011 Document Reviewed: 03/24/2011 [facility] Patient Information 2012 [place].

## 2011-12-31 NOTE — Progress Notes (Signed)
Subjective: Pt ok.  No new c/o  Objective: Physical Exam: BP 139/75  Pulse 78  Temp 98.2 F (36.8 C) (Oral)  Resp 18  Ht 5\' 7"  (1.702 m)  Wt 147 lb 11.2 oz (66.996 kg)  BMI 23.13 kg/m2  SpO2 97% Drain intact, site clean, NT Output still yellowish brown, about 30cc recorded yesterday   Labs: CBC  Basename 12/31/11 0340 12/30/11 0355  WBC 7.7 8.5  HGB 10.9* 10.8*  HCT 31.7* 32.3*  PLT 390 347   BMET  Basename 12/31/11 0340 12/30/11 0355  NA 136 136  K 3.7 4.0  CL 101 102  CO2 26 26  GLUCOSE 90 108*  BUN 15 11  CREATININE 1.37* 1.38*  CALCIUM 8.4 8.5   LFT  Basename 12/31/11 0340  PROT 5.8*  ALBUMIN 2.5*  AST 17  ALT 8  ALKPHOS 52  BILITOT 0.3  BILIDIR --  IBILI --  LIPASE --   PT/INR No results found for this basename: LABPROT:2,INR:2 in the last 72 hours   Studies/Results: No results found.  Assessment/Plan: Diverticular abscess, now clinically suspicious for fistula  S/p perc drain 8/20-resolving by recent follow up CT [facility] plans noted for home with drain possibly today   LOS: 9 days    Ascencion Dike PA-C 12/31/2011 10:50 AM

## 2011-12-31 NOTE — Discharge Summary (Signed)
Patient will require CT scan next week to make sure the pelvic fluid collection has resolved.  If it is worsened, he may need a evaluation by interventional radiology to see percutaneous drainage is more feasible at that point.  Because he is clinically improving, I am hopeful that the fluid collection resolving and continued to improve.  Needs to followup with [place] to consider elective resection.  He is happy to follow with Dr. Harlow Asa.  I can assume care if needed

## 2011-12-31 NOTE — Progress Notes (Signed)
  Subjective: Sleepy up late and early this AM.  Abdomen is soft.  He would like to go home. IV has come out and not replaced with possible D/C today.  Objective: Vital signs in last 24 hours: Temp:  [97.6 F (36.4 C)-98.5 F (36.9 C)] 98.2 F (36.8 C) (08/28 0545) Pulse Rate:  [59-78] 78  (08/28 0545) Resp:  [16-18] 18  (08/28 0545) BP: (91-139)/(57-75) 139/75 mmHg (08/28 0545) SpO2:  [97 %-98 %] 97 % (08/28 0545) Last BM Date: 12/30/11  720 Po recorded. 2 stools, diet: low fiber, afebrile, VSS, WBC normal,  Creatinine is still up today.  Intake/Output from previous day: 08/27 0701 - 08/28 0700 In: 3088.3 [P.O.:720; I.V.:2053.3; IV Piggyback:300] Out: 2683 [Urine:2650; Drains:33] Intake/Output this shift: Total I/O In: -  Out: 200 [Urine:200]  General appearance: alert, cooperative and no distress Resp: clear to auscultation bilaterally GI: soft, non-tender; bowel sounds normal; no masses,  no organomegaly and drain, has some clear stuff in it now, and the yellow brown colored drainage both in tubing.  Lab Results:   Basename 12/31/11 0340 12/30/11 0355  WBC 7.7 8.5  HGB 10.9* 10.8*  HCT 31.7* 32.3*  PLT 390 347    BMET  Basename 12/31/11 0340 12/30/11 0355  NA 136 136  K 3.7 4.0  CL 101 102  CO2 26 26  GLUCOSE 90 108*  BUN 15 11  CREATININE 1.37* 1.38*  CALCIUM 8.4 8.5   PT/INR No results found for this basename: LABPROT:2,INR:2 in the last 72 hours   Lab 12/31/11 0340  AST 17  ALT 8  ALKPHOS 52  BILITOT 0.3  PROT 5.8*  ALBUMIN 2.5*     Lipase  No results found for this basename: lipase     Studies/Results: No results found.  Medications:    . amLODipine  10 mg Oral Daily  . hydrALAZINE  25 mg Oral TID  . irbesartan  300 mg Oral Daily   And  . hydrochlorothiazide  12.5 mg Oral Daily  . piperacillin-tazobactam (ZOSYN)  IV  3.375 g Intravenous Q8H  . simvastatin  20 mg Oral QPM  . sodium chloride  3 mL Intravenous Q12H  . Tamsulosin  HCl  0.4 mg Oral BID    Assessment/Plan Sigmoid Diverticulitis with peridiverticular abscess, status post percutaneous drainage 12/23/11 in IR DR. Hassell. Culture grew Streptococcus F  Repeat Ct scan yesterday shows a new collection 4 x2 x 2 cm in the pelvis that cannot be drained per IR.  Hx of atrial flutter//DC cardioversion 06/16/11/hx of cardiac AF ablation & PTVP placement, 11/28/11 Dr. Einar Gip and Dr. Rayann Heman. With hx of syncope and collapse.  Hypertension  Dyslipidemia  Prostate CA with seed implants. Renal Insuffiencey with  Some elevation of creatinine up and down during hospitalization   Plan:  We are going to let him go home today.  Dr. Johney Maine would like to send him on Augmentin, I will send him home with one more week and have him follow up with both Primary Care and Dr. Harlow Asa next week.    LOS: 9 days    [name] [name] 12/31/2011

## 2011-12-31 NOTE — Progress Notes (Signed)
Agree with above assessment.  Continue drain with outpatient followup  Will need followup to consider elective colon resection.  Would benefit from followup CT scan to make sure fluid collection has resolved

## 2012-01-01 DIAGNOSIS — I10 Essential (primary) hypertension: Secondary | ICD-10-CM | POA: Diagnosis not present

## 2012-01-01 DIAGNOSIS — K5732 Diverticulitis of large intestine without perforation or abscess without bleeding: Secondary | ICD-10-CM | POA: Diagnosis not present

## 2012-01-01 DIAGNOSIS — C61 Malignant neoplasm of prostate: Secondary | ICD-10-CM | POA: Diagnosis not present

## 2012-01-01 DIAGNOSIS — I4892 Unspecified atrial flutter: Secondary | ICD-10-CM | POA: Diagnosis not present

## 2012-01-01 DIAGNOSIS — K651 Peritoneal abscess: Secondary | ICD-10-CM | POA: Diagnosis not present

## 2012-01-02 ENCOUNTER — Encounter (HOSPITAL_COMMUNITY): Payer: Self-pay | Admitting: *Deleted

## 2012-01-02 DIAGNOSIS — I4892 Unspecified atrial flutter: Secondary | ICD-10-CM | POA: Diagnosis not present

## 2012-01-02 DIAGNOSIS — K5732 Diverticulitis of large intestine without perforation or abscess without bleeding: Secondary | ICD-10-CM | POA: Diagnosis not present

## 2012-01-02 DIAGNOSIS — C61 Malignant neoplasm of prostate: Secondary | ICD-10-CM | POA: Diagnosis not present

## 2012-01-02 DIAGNOSIS — K651 Peritoneal abscess: Secondary | ICD-10-CM | POA: Diagnosis not present

## 2012-01-02 DIAGNOSIS — I10 Essential (primary) hypertension: Secondary | ICD-10-CM | POA: Diagnosis not present

## 2012-01-08 ENCOUNTER — Encounter (INDEPENDENT_AMBULATORY_CARE_PROVIDER_SITE_OTHER): Payer: Self-pay | Admitting: Surgery

## 2012-01-08 ENCOUNTER — Ambulatory Visit (INDEPENDENT_AMBULATORY_CARE_PROVIDER_SITE_OTHER): Payer: Medicare Other | Admitting: Surgery

## 2012-01-08 VITALS — BP 144/68 | HR 70 | Temp 99.1°F | Resp 16 | Ht 67.0 in | Wt 149.0 lb

## 2012-01-08 DIAGNOSIS — K651 Peritoneal abscess: Secondary | ICD-10-CM

## 2012-01-08 DIAGNOSIS — K5732 Diverticulitis of large intestine without perforation or abscess without bleeding: Secondary | ICD-10-CM

## 2012-01-08 DIAGNOSIS — IMO0002 Reserved for concepts with insufficient information to code with codable children: Secondary | ICD-10-CM

## 2012-01-08 NOTE — Progress Notes (Signed)
General Surgery [facility]  Visit Diagnoses: 1. Diverticulitis of sigmoid colon   2. Abscess, abdomen     HISTORY: Patient is a 76 year old black male who was admitted to [facility] long hospital when a CT scan of the abdomen and pelvis demonstrated acute diverticulitis with abscess formation. Patient underwent CT-guided percutaneous drainage. He was treated with intravenous antibiotics. Patient was discharged from the hospital on oral antibiotics. He returns today for followup. Patient still has a drain in place. Is being irrigated once daily.  Patient feels well. He has had no further pain. He denies fevers or chills. He is having 2-3 bowel movements daily.  EXAM: Abdomen is soft nontender without distention. Percutaneous drain in the lower midline remains in place. There is thin yellow serous fluid present within the drainage bag. On palpation there are no palpable masses.  IMPRESSION: Acute diverticulitis with abscess, status post percutaneous drainage  PLAN: Patient has completed his course of oral antibiotics as of yesterday. We will arrange for followup CT scan and drain removal if the abscess has resolved completely. Patient will return for followup in approximately 4 weeks. At that time we will reexamine his abdomen and make a decision regarding a followup colonoscopy or barium enema study. Patient understands and agrees with this plan.  Earnstine Regal, MD, [facility]

## 2012-01-08 NOTE — Patient Instructions (Signed)
Continue drain care.  Drain will be removed if CT scan results show resolution of abscess.  Earnstine Regal, MD, [facility] Office: [phone]

## 2012-01-12 ENCOUNTER — Ambulatory Visit (HOSPITAL_COMMUNITY)
Admission: RE | Admit: 2012-01-12 | Discharge: 2012-01-12 | Disposition: A | Discharge status: Home / Self Care | Payer: Medicare Other | Source: Ambulatory Visit | Attending: Surgery | Admitting: Surgery

## 2012-01-12 DIAGNOSIS — K651 Peritoneal abscess: Secondary | ICD-10-CM | POA: Diagnosis not present

## 2012-01-12 DIAGNOSIS — K5732 Diverticulitis of large intestine without perforation or abscess without bleeding: Secondary | ICD-10-CM | POA: Insufficient documentation

## 2012-01-12 DIAGNOSIS — IMO0002 Reserved for concepts with insufficient information to code with codable children: Secondary | ICD-10-CM

## 2012-01-12 DIAGNOSIS — K630 Abscess of intestine: Secondary | ICD-10-CM | POA: Diagnosis not present

## 2012-01-12 DIAGNOSIS — C61 Malignant neoplasm of prostate: Secondary | ICD-10-CM | POA: Diagnosis not present

## 2012-01-12 MED ORDER — IOHEXOL 300 MG/ML  SOLN
100.0000 mL | Freq: Once | INTRAMUSCULAR | Status: AC | PRN
  Administered 2012-01-12: 100 mL via INTRAVENOUS

## 2012-01-13 DIAGNOSIS — K5732 Diverticulitis of large intestine without perforation or abscess without bleeding: Secondary | ICD-10-CM | POA: Diagnosis not present

## 2012-01-13 DIAGNOSIS — C61 Malignant neoplasm of prostate: Secondary | ICD-10-CM | POA: Diagnosis not present

## 2012-01-14 DIAGNOSIS — L02219 Cutaneous abscess of trunk, unspecified: Secondary | ICD-10-CM | POA: Diagnosis not present

## 2012-01-14 DIAGNOSIS — I10 Essential (primary) hypertension: Secondary | ICD-10-CM | POA: Diagnosis not present

## 2012-01-14 DIAGNOSIS — C61 Malignant neoplasm of prostate: Secondary | ICD-10-CM | POA: Diagnosis not present

## 2012-01-14 DIAGNOSIS — I4891 Unspecified atrial fibrillation: Secondary | ICD-10-CM | POA: Diagnosis not present

## 2012-01-19 DIAGNOSIS — I10 Essential (primary) hypertension: Secondary | ICD-10-CM | POA: Diagnosis not present

## 2012-01-19 DIAGNOSIS — E7800 Pure hypercholesterolemia, unspecified: Secondary | ICD-10-CM | POA: Diagnosis not present

## 2012-01-19 DIAGNOSIS — I495 Sick sinus syndrome: Secondary | ICD-10-CM | POA: Diagnosis not present

## 2012-01-19 DIAGNOSIS — I4892 Unspecified atrial flutter: Secondary | ICD-10-CM | POA: Diagnosis not present

## 2012-02-11 ENCOUNTER — Encounter (INDEPENDENT_AMBULATORY_CARE_PROVIDER_SITE_OTHER): Payer: Self-pay | Admitting: Surgery

## 2012-02-11 ENCOUNTER — Ambulatory Visit (INDEPENDENT_AMBULATORY_CARE_PROVIDER_SITE_OTHER): Payer: Medicare Other | Admitting: Surgery

## 2012-02-11 VITALS — BP 116/72 | HR 70 | Temp 97.3°F | Resp 14 | Ht 67.0 in | Wt 150.2 lb

## 2012-02-11 DIAGNOSIS — K5732 Diverticulitis of large intestine without perforation or abscess without bleeding: Secondary | ICD-10-CM | POA: Diagnosis not present

## 2012-02-11 NOTE — Progress Notes (Signed)
General Surgery [facility]  Visit Diagnoses: 1. Diverticulitis of sigmoid colon     HISTORY: The patient is a 76 year old black male who was hospitalized with acute diverticulitis with abscess. He underwent percutaneous drainage. Followup CT scan showed resolution of the abscess and the drainage catheter was removed. He returns today for followup.  PERTINENT REVIEW OF SYSTEMS: Small drainage at site of catheter placement. No nausea or vomiting. Normal bowel movements. Tolerating regular diet. No fever or chills.  EXAM: HEENT: normocephalic; pupils equal and reactive; sclerae clear; dentition good; mucous membranes moist NECK:  symmetric on extension; no palpable anterior or posterior cervical lymphadenopathy; no supraclavicular masses; no tenderness CHEST: clear to auscultation bilaterally without rales, rhonchi, or wheezes CARDIAC: regular rate and rhythm without significant murmur; peripheral pulses are full ABDOMEN: Abdomen is soft without distention; no tenderness; no palpable masses; small punctate wound in the lower midline consistent with drainage catheter site with slight induration and small serous drainage; and dry gauze dressing applied EXT:  non-tender without edema; no deformity NEURO: no gross focal deficits; no sign of tremor   IMPRESSION: Diverticular disease with recent abscess, resolved  PLAN: The patient and I discussed the above findings. We discussed local wound care for his train site. I would like him to undergo a barium enema in approximately 3-4 weeks to evaluate the colon for extent of diverticular disease and to make sure there is no sign of fistula or recurrent abscess. Patient will return in 6 weeks to review these results and make a final decision regarding the need for surgery.  Earnstine Regal, MD, [facility] Office: [phone]

## 2012-02-11 NOTE — Addendum Note (Signed)
Addended by: Dois Davenport on: 02/11/2012 11:51 AM   Modules accepted: Orders

## 2012-02-11 NOTE — Patient Instructions (Signed)
Barium Enema A barium enema has been ordered for you by your caregiver to check out (evaluate) your large intestine (colon). This test helps to evaluate the large bowel and rectum for the problems which may have brought you to your caregiver. This is also performed as part of a routine physical exam in later years of life and then it is used to search for polyps and lumps (tumors) or other disease processes which could shorten or affect life. LET YOUR CAREGIVER KNOW ABOUT:  Allergies.  Medications taken including herbs, eye drops, over-the-counter medications, and creams.  Use of steroids (by mouth or creams).  Previous problems with anesthetics.  Possibility of pregnancy, if this applies.  History of blood clots (thrombophlebitis).  History of bleeding or blood problems.  Previous surgery.  Previous or existing colon problems.  Other health problems. Let your technologist know if you were unable to complete the preparations for your test or unable to follow the dietary instructions. BEFORE THE PROCEDURE Follow your instructions for test preparation so it will not need rescheduling. Try to follow instructions carefully as this will clean out your colon and improve the quality of your X-rays. A liquid diet may be required for a few days before testing. No food is allowed the night before the test. Suppositories, laxatives, or an enema may be required before testing. This preparation is to empty your colon before the test. Arrive here 60 minutes before check in or as directed. The actual test takes about an hour. PROCEDURE A small tube is inserted into your rectum. This has a tip on the end with a balloon that can be inflated. The balloon is used to help you retain the barium that is put into your colon prior to the X-rays. Barium is a white chalky substance which helps outline the inside of your colon on X-ray. You may feel the need to go to the bathroom; however the balloon will help  prevent this while X-rays are being taken. During your exam you may be told to shift position and to hold your breath briefly. Some gentle pressure may be applied to your belly (abdomen). All of this is done to obtain better X-rays. Following the X-rays you will be allowed to go the bathroom. Some times air may be put into the colon through the tube to obtain even better X-rays if necessary. These are called air contrast studies. A final X-ray may then be taken. You may leave when the technologist informs you that you are through. For your comfort during the test:  Relax as much as possible during the test.  Try to follow your technologist's instructions to speed up the test and make it more effective. AFTER THE PROCEDURE  You may resume normal activities.  Call for your X-ray results as instructed by your caregiver or technologist.  Call your caregiver as instructed.  Drink plenty of fluids and eat enough fruits and vegetables to keep your stools soft. The stools may appear lighter for a few days following the test. This is from the barium being passed in the stool. SEEK IMMEDIATE MEDICAL CARE IF:  You should become lightheaded or faint.  You pass blood from the rectum.  You develop abdominal pain. Document Released: 04/18/2000 Document Revised: 07/14/2011 Document Reviewed: 11/17/2008 [facility] Patient Information 2013 [place].

## 2012-02-13 DIAGNOSIS — I10 Essential (primary) hypertension: Secondary | ICD-10-CM | POA: Diagnosis not present

## 2012-02-13 DIAGNOSIS — C61 Malignant neoplasm of prostate: Secondary | ICD-10-CM | POA: Diagnosis not present

## 2012-02-13 DIAGNOSIS — I4891 Unspecified atrial fibrillation: Secondary | ICD-10-CM | POA: Diagnosis not present

## 2012-03-03 ENCOUNTER — Ambulatory Visit
Admission: RE | Admit: 2012-03-03 | Discharge: 2012-03-03 | Disposition: A | Discharge status: Home / Self Care | Payer: Medicare Other | Source: Ambulatory Visit | Attending: Surgery | Admitting: Surgery

## 2012-03-03 DIAGNOSIS — K5732 Diverticulitis of large intestine without perforation or abscess without bleeding: Secondary | ICD-10-CM

## 2012-03-17 ENCOUNTER — Encounter: Payer: Self-pay | Admitting: Internal Medicine

## 2012-03-17 ENCOUNTER — Ambulatory Visit (INDEPENDENT_AMBULATORY_CARE_PROVIDER_SITE_OTHER): Payer: Medicare Other | Admitting: Internal Medicine

## 2012-03-17 VITALS — BP 134/62 | HR 72 | Ht 66.0 in | Wt 154.0 lb

## 2012-03-17 DIAGNOSIS — I495 Sick sinus syndrome: Secondary | ICD-10-CM | POA: Diagnosis not present

## 2012-03-17 DIAGNOSIS — Z950 Presence of cardiac pacemaker: Secondary | ICD-10-CM | POA: Diagnosis not present

## 2012-03-17 DIAGNOSIS — I10 Essential (primary) hypertension: Secondary | ICD-10-CM

## 2012-03-17 LAB — PACEMAKER DEVICE OBSERVATION
BAMS-0001: 170 {beats}/min
BAMS-0003: 70 {beats}/min
BATTERY VOLTAGE: 2.96 V
RV LEAD AMPLITUDE: 12 mv
VENTRICULAR PACING PM: 1

## 2012-03-17 NOTE — Patient Instructions (Addendum)
Your physician recommends that you schedule a follow-up appointment with Dr Einar Gip

## 2012-03-22 ENCOUNTER — Encounter: Payer: Self-pay | Admitting: Internal Medicine

## 2012-03-22 NOTE — Progress Notes (Signed)
PCP: Philis Fendt, MD Primary Cardiologist:  Dr Estill Dooms is a 76 y.o. male who presents today for routine electrophysiology followup.  Since having his pacemake implanted, the patient reports doing very well.  His exercise tolerance is improved. Today, he denies symptoms of palpitations, chest pain, shortness of breath,  lower extremity edema, dizziness, presyncope, or syncope.  The patient is otherwise without complaint today.   Past Medical History  Diagnosis Date  . Prostate cancer   . Atrial flutter   . Hypertension 04/30/11    Cardioversion 06/16/11  . Hyperlipemia   . Pacemaker   . Dyslipidemia 12/30/2011  . Carcinoma of prostate 12/30/2011    Treated with seed/radiation therapy.   . Diverticulitis   . Heart murmur    Past Surgical History  Procedure Date  . Cardioversion 06/16/2011    Procedure: CARDIOVERSION;  Surgeon: Laverda Page, MD;  Location: [place];  Service: Cardiovascular;  Laterality: N/A;  . Tonsillectomy   . Radioactive seed implant   . Cardiac electrophysiology study and ablation   . Pacemaker insertion 11/28/11    SJM Accent DR RF implanted by Dr Rayann Heman    Current Outpatient Prescriptions  Medication Sig Dispense Refill  . aspirin 81 MG chewable tablet Chew 81 mg by mouth daily.      Marland Kitchen BENICAR 20 MG tablet Daily.      . simvastatin (ZOCOR) 20 MG tablet Take 20 mg by mouth every evening.      . Tamsulosin HCl (FLOMAX) 0.4 MG CAPS Take 0.4 mg by mouth 2 (two) times daily.         Physical Exam: Filed Vitals:   03/17/12 1445  BP: 134/62  Pulse: 72  Height: 5\' 6"  (1.676 m)  Weight: 154 lb (69.854 kg)  SpO2: 98%    GEN- The patient is well appearing, alert and oriented x 3 today.   Head- normocephalic, atraumatic Eyes-  Sclera clear, conjunctiva pink Ears- hearing intact Oropharynx- clear Lungs- Clear to ausculation bilaterally, normal work of breathing Chest- pacemaker pocket is well healed Heart- Regular rate and rhythm, no  murmurs, rubs or gallops, PMI not laterally displaced GI- soft, NT, ND, + BS Extremities- no clubbing, cyanosis, or edema  Pacemaker interrogation- reviewed in detail today,  See PACEART report  Assessment and Plan:  1. Bradycardia Normal pacemaker function See Pace Art report No changes today  2. HTN- stable  Follow-up with Dr Einar Gip I will see as needed going forward

## 2012-03-25 ENCOUNTER — Encounter (INDEPENDENT_AMBULATORY_CARE_PROVIDER_SITE_OTHER): Payer: Self-pay | Admitting: Surgery

## 2012-03-25 ENCOUNTER — Ambulatory Visit (INDEPENDENT_AMBULATORY_CARE_PROVIDER_SITE_OTHER): Payer: Medicare Other | Admitting: Surgery

## 2012-03-25 VITALS — BP 98/56 | HR 64 | Temp 97.0°F | Resp 12 | Ht 67.0 in | Wt 154.0 lb

## 2012-03-25 DIAGNOSIS — K5732 Diverticulitis of large intestine without perforation or abscess without bleeding: Secondary | ICD-10-CM | POA: Diagnosis not present

## 2012-03-25 NOTE — Patient Instructions (Signed)
Diverticulosis Diverticulosis is a common condition that develops when small pouches (diverticula) form in the wall of the colon. The risk of diverticulosis increases with age. It happens more often in people who eat a low-fiber diet. Most individuals with diverticulosis have no symptoms. Those individuals with symptoms usually experience abdominal pain, constipation, or loose stools (diarrhea). HOME CARE INSTRUCTIONS   Increase the amount of fiber in your diet as directed by your caregiver or dietician. This may reduce symptoms of diverticulosis.  Your caregiver may recommend taking a dietary fiber supplement.  Drink at least 6 to 8 glasses of water each day to prevent constipation.  Try not to strain when you have a bowel movement.  Your caregiver may recommend avoiding nuts and seeds to prevent complications, although this is still an uncertain benefit.  Only take over-the-counter or prescription medicines for pain, discomfort, or fever as directed by your caregiver. FOODS WITH HIGH FIBER CONTENT INCLUDE:  Fruits. Apple, peach, pear, tangerine, raisins, prunes.  Vegetables. Brussels sprouts, asparagus, broccoli, cabbage, carrot, cauliflower, romaine lettuce, spinach, summer squash, tomato, winter squash, zucchini.  Starchy Vegetables. Baked beans, kidney beans, lima beans, split peas, lentils, potatoes (with skin).  Grains. Whole wheat bread, brown rice, bran flake cereal, plain oatmeal, white rice, shredded wheat, bran muffins. SEEK IMMEDIATE MEDICAL CARE IF:   You develop increasing pain or severe bloating.  You have an oral temperature above 102 F (38.9 C), not controlled by medicine.  You develop vomiting or bowel movements that are bloody or black. Document Released: 01/17/2004 Document Revised: 07/14/2011 Document Reviewed: 09/19/2009 [facility] Patient Information 2013 [place].

## 2012-03-25 NOTE — Progress Notes (Signed)
General Surgery [facility]  Visit Diagnoses: 1. Diverticulitis of sigmoid colon     HISTORY: Patient is a 76 year old black male followed for acute diverticulitis with abscess formation. He was admitted to the general surgery service. He underwent percutaneous drainage. The drain was subsequently removed. He has made a nice recovery. At my request he underwent a followup barium enema study which shows no sign of fistula. There is no sign of residual abscess. There are scattered diverticuli present throughout the colon with the majority concentrated near the rectosigmoid junction.  Clinically the patient is well. He is eating a regular diet. He is having regular bowel movements. He has not seen any further drainage from the catheter site.  EXAM: Abdomen is soft nontender without distention. Drain site in the lower midline has epithelialized. There is no drainage. There is some induration in the subcutaneous tissues. With Valsalva there is no sign of herniation. Left lower quadrant is soft and nontender without mass.  IMPRESSION: Diverticular disease with personal history of diverticular abscess managed with percutaneous drainage  PLAN: The patient and I discussed options for further management. We discussed segmental colectomy as an elective procedure. At this time the patient does not wish to proceed with any surgical intervention. Since this is his first episode of diverticular disease and his first complication, I think it is acceptable to monitor him closely in the future and should he develop recurrent symptoms to initiate prompt treatment and consider surgical intervention at that time.  Patient will followup with his primary care physician and his cardiologist. Patient will return to see me as needed.  Earnstine Regal, MD, [facility]

## 2012-04-09 DIAGNOSIS — E7800 Pure hypercholesterolemia, unspecified: Secondary | ICD-10-CM | POA: Diagnosis not present

## 2012-04-09 DIAGNOSIS — I10 Essential (primary) hypertension: Secondary | ICD-10-CM | POA: Diagnosis not present

## 2012-04-09 DIAGNOSIS — I495 Sick sinus syndrome: Secondary | ICD-10-CM | POA: Diagnosis not present

## 2012-04-09 DIAGNOSIS — Z950 Presence of cardiac pacemaker: Secondary | ICD-10-CM | POA: Diagnosis not present

## 2012-04-23 DIAGNOSIS — E785 Hyperlipidemia, unspecified: Secondary | ICD-10-CM | POA: Diagnosis not present

## 2012-04-23 DIAGNOSIS — I10 Essential (primary) hypertension: Secondary | ICD-10-CM | POA: Diagnosis not present

## 2012-04-23 DIAGNOSIS — I4891 Unspecified atrial fibrillation: Secondary | ICD-10-CM | POA: Diagnosis not present

## 2012-04-23 DIAGNOSIS — E559 Vitamin D deficiency, unspecified: Secondary | ICD-10-CM | POA: Diagnosis not present

## 2012-07-26 DIAGNOSIS — I10 Essential (primary) hypertension: Secondary | ICD-10-CM | POA: Diagnosis not present

## 2012-07-26 DIAGNOSIS — Z950 Presence of cardiac pacemaker: Secondary | ICD-10-CM | POA: Diagnosis not present

## 2012-07-26 DIAGNOSIS — E7800 Pure hypercholesterolemia, unspecified: Secondary | ICD-10-CM | POA: Diagnosis not present

## 2012-07-26 DIAGNOSIS — I495 Sick sinus syndrome: Secondary | ICD-10-CM | POA: Diagnosis not present

## 2012-08-12 DIAGNOSIS — C61 Malignant neoplasm of prostate: Secondary | ICD-10-CM | POA: Diagnosis not present

## 2012-08-19 DIAGNOSIS — C61 Malignant neoplasm of prostate: Secondary | ICD-10-CM | POA: Diagnosis not present

## 2012-10-25 DIAGNOSIS — Z950 Presence of cardiac pacemaker: Secondary | ICD-10-CM | POA: Diagnosis not present

## 2012-10-25 DIAGNOSIS — I10 Essential (primary) hypertension: Secondary | ICD-10-CM | POA: Diagnosis not present

## 2012-10-25 DIAGNOSIS — I495 Sick sinus syndrome: Secondary | ICD-10-CM | POA: Diagnosis not present

## 2012-11-15 DIAGNOSIS — C61 Malignant neoplasm of prostate: Secondary | ICD-10-CM | POA: Diagnosis not present

## 2012-11-22 DIAGNOSIS — C61 Malignant neoplasm of prostate: Secondary | ICD-10-CM | POA: Diagnosis not present

## 2013-01-17 DIAGNOSIS — Z950 Presence of cardiac pacemaker: Secondary | ICD-10-CM | POA: Diagnosis not present

## 2013-01-17 DIAGNOSIS — I10 Essential (primary) hypertension: Secondary | ICD-10-CM | POA: Diagnosis not present

## 2013-01-17 DIAGNOSIS — I495 Sick sinus syndrome: Secondary | ICD-10-CM | POA: Diagnosis not present

## 2013-02-04 DIAGNOSIS — Z950 Presence of cardiac pacemaker: Secondary | ICD-10-CM | POA: Diagnosis not present

## 2013-02-14 DIAGNOSIS — C61 Malignant neoplasm of prostate: Secondary | ICD-10-CM | POA: Diagnosis not present

## 2013-02-14 DIAGNOSIS — I10 Essential (primary) hypertension: Secondary | ICD-10-CM | POA: Diagnosis not present

## 2013-02-14 DIAGNOSIS — E785 Hyperlipidemia, unspecified: Secondary | ICD-10-CM | POA: Diagnosis not present

## 2013-02-14 DIAGNOSIS — I4891 Unspecified atrial fibrillation: Secondary | ICD-10-CM | POA: Diagnosis not present

## 2013-02-14 DIAGNOSIS — Z13220 Encounter for screening for lipoid disorders: Secondary | ICD-10-CM | POA: Diagnosis not present

## 2013-02-15 DIAGNOSIS — C61 Malignant neoplasm of prostate: Secondary | ICD-10-CM | POA: Diagnosis not present

## 2013-04-10 ENCOUNTER — Emergency Department (HOSPITAL_COMMUNITY)
Admission: EM | Admit: 2013-04-10 | Discharge: 2013-04-10 | Disposition: A | Discharge status: Home / Self Care | Payer: Medicare Other | Attending: Emergency Medicine | Admitting: Emergency Medicine

## 2013-04-10 ENCOUNTER — Emergency Department (HOSPITAL_COMMUNITY): Payer: Medicare Other

## 2013-04-10 ENCOUNTER — Encounter (HOSPITAL_COMMUNITY): Payer: Self-pay | Admitting: Emergency Medicine

## 2013-04-10 DIAGNOSIS — Z87891 Personal history of nicotine dependence: Secondary | ICD-10-CM | POA: Insufficient documentation

## 2013-04-10 DIAGNOSIS — R011 Cardiac murmur, unspecified: Secondary | ICD-10-CM | POA: Diagnosis not present

## 2013-04-10 DIAGNOSIS — R109 Unspecified abdominal pain: Secondary | ICD-10-CM | POA: Diagnosis not present

## 2013-04-10 DIAGNOSIS — E785 Hyperlipidemia, unspecified: Secondary | ICD-10-CM | POA: Insufficient documentation

## 2013-04-10 DIAGNOSIS — Z7982 Long term (current) use of aspirin: Secondary | ICD-10-CM | POA: Insufficient documentation

## 2013-04-10 DIAGNOSIS — Z8546 Personal history of malignant neoplasm of prostate: Secondary | ICD-10-CM | POA: Insufficient documentation

## 2013-04-10 DIAGNOSIS — K632 Fistula of intestine: Secondary | ICD-10-CM

## 2013-04-10 DIAGNOSIS — I10 Essential (primary) hypertension: Secondary | ICD-10-CM | POA: Insufficient documentation

## 2013-04-10 DIAGNOSIS — Z79899 Other long term (current) drug therapy: Secondary | ICD-10-CM | POA: Insufficient documentation

## 2013-04-10 DIAGNOSIS — Z950 Presence of cardiac pacemaker: Secondary | ICD-10-CM | POA: Insufficient documentation

## 2013-04-10 DIAGNOSIS — R1084 Generalized abdominal pain: Secondary | ICD-10-CM | POA: Diagnosis not present

## 2013-04-10 DIAGNOSIS — K869 Disease of pancreas, unspecified: Secondary | ICD-10-CM | POA: Diagnosis not present

## 2013-04-10 LAB — CBC WITH DIFFERENTIAL/PLATELET
Basophils Relative: 0 % (ref 0–1)
Hemoglobin: 11.5 g/dL — ABNORMAL LOW (ref 13.0–17.0)
MCHC: 33.2 g/dL (ref 30.0–36.0)
Monocytes Relative: 8 % (ref 3–12)
Neutro Abs: 3.2 10*3/uL (ref 1.7–7.7)
Neutrophils Relative %: 56 % (ref 43–77)
Platelets: 228 10*3/uL (ref 150–400)
RBC: 3.8 MIL/uL — ABNORMAL LOW (ref 4.22–5.81)

## 2013-04-10 LAB — BASIC METABOLIC PANEL
BUN: 24 mg/dL — ABNORMAL HIGH (ref 6–23)
Chloride: 106 mEq/L (ref 96–112)
GFR calc Af Amer: 56 mL/min — ABNORMAL LOW (ref >90)
Potassium: 4.2 mEq/L (ref 3.5–5.1)
Sodium: 139 mEq/L (ref 135–145)

## 2013-04-10 LAB — URINALYSIS, ROUTINE W REFLEX MICROSCOPIC
Bilirubin Urine: NEGATIVE
Glucose, UA: NEGATIVE mg/dL
Hgb urine dipstick: NEGATIVE
Ketones, ur: NEGATIVE mg/dL
Protein, ur: NEGATIVE mg/dL
Urobilinogen, UA: 1 mg/dL (ref 0.0–1.0)

## 2013-04-10 MED ORDER — IOHEXOL 300 MG/ML  SOLN
100.0000 mL | Freq: Once | INTRAMUSCULAR | Status: AC | PRN
  Administered 2013-04-10: 100 mL via INTRAVENOUS

## 2013-04-10 MED ORDER — MORPHINE SULFATE 4 MG/ML IJ SOLN
4.0000 mg | Freq: Once | INTRAMUSCULAR | Status: AC
  Administered 2013-04-10: 4 mg via INTRAVENOUS
  Filled 2013-04-10: qty 1

## 2013-04-10 MED ORDER — OXYCODONE-ACETAMINOPHEN 5-325 MG PO TABS: 2.0000 | ORAL_TABLET | ORAL | Status: DC | PRN

## 2013-04-10 MED ORDER — IOHEXOL 300 MG/ML  SOLN
50.0000 mL | Freq: Once | INTRAMUSCULAR | Status: AC | PRN
  Administered 2013-04-10: 50 mL via ORAL

## 2013-04-10 NOTE — ED Provider Notes (Signed)
I discussed the case with Dr. Lucia Gaskins of general surgery.  Patient will be discharged all with instructions to call the office tomorrow for followup sometime this week.  He will be told to return to emergency department if he develops fever, vomiting, change in symptomatology.  Dot Lanes, MD 04/10/13 [phone]

## 2013-04-10 NOTE — ED Notes (Signed)
Per [facility], pt. from home with complaint of abdominal @8 /10 which started a week ago which get worse every time he has bowel movement. Pt. Had abdominal surgery a year ago,pt.still has the "drains" on his  Mid lower abdomen. Denies N/V.

## 2013-04-10 NOTE — ED Notes (Signed)
Pt alert and oriented x4. Respirations even and unlabored. Bilateral rise and fall of chest. Skin warm and dry. In no acute distress. Denies needs.

## 2013-04-10 NOTE — ED Notes (Signed)
MD at bedside. 

## 2013-04-10 NOTE — ED Provider Notes (Signed)
CSN: [id number]     Arrival date & time 04/10/13  0509 History   First MD Initiated Contact with Patient 04/10/13 [phone]     Chief Complaint  Patient presents with  . Abdominal Pain   (Consider location/radiation/quality/duration/timing/severity/associated sxs/prior Treatment) HPI 77 year old male presents to emergency apartment with complaint of one week of worsening left lower quadrant pain and new drainage from his prior drain site.  Patient had diverticulitis with drain placement a year ago.  He reports since having the drain pulled, he has always had a small defect to his left lower abdomen.  Over the last week, pain, has been getting worse with swelling to the area.  In the last 2 days he has had increased drainage, which has changed from a clear pink to a brown, foul-smelling, thick, purulent discharge Past Medical History  Diagnosis Date  . Prostate cancer   . Atrial flutter   . Hypertension 04/30/11    Cardioversion 06/16/11  . Hyperlipemia   . Pacemaker   . Dyslipidemia 12/30/2011  . Carcinoma of prostate 12/30/2011    Treated with seed/radiation therapy.   . Diverticulitis   . Heart murmur    Past Surgical History  Procedure Laterality Date  . Cardioversion  06/16/2011    Procedure: CARDIOVERSION;  Surgeon: Laverda Page, MD;  Location: [place];  Service: Cardiovascular;  Laterality: N/A;  . Tonsillectomy    . Radioactive seed implant    . Cardiac electrophysiology study and ablation    . Pacemaker insertion  11/28/11    SJM Accent DR RF implanted by Dr Rayann Heman   History reviewed. No pertinent family history. History  Substance Use Topics  . Smoking status: Former Smoker -- 12 years    Quit date: 07/20/2009  . Smokeless tobacco: Never Used  . Alcohol Use: No    Review of Systems  All other systems reviewed and are negative.    Allergies  Review of patient's allergies indicates no known allergies.  Home Medications   Current Outpatient Rx  Name  Route  Sig   Dispense  Refill  . amLODipine (NORVASC) 10 MG tablet               . aspirin 81 MG chewable tablet   Oral   Chew 81 mg by mouth daily.         Marland Kitchen BENICAR 20 MG tablet      Daily.         . simvastatin (ZOCOR) 20 MG tablet   Oral   Take 20 mg by mouth every evening.         . Tamsulosin HCl (FLOMAX) 0.4 MG CAPS   Oral   Take 0.4 mg by mouth 2 (two) times daily.           BP 133/54  Pulse 60  Temp(Src) 98.5 F (36.9 C) (Oral)  Resp 18  SpO2 98% Physical Exam  Nursing note and vitals reviewed. Constitutional: He is oriented to person, place, and time. He appears well-developed and well-nourished.  HENT:  Head: Normocephalic and atraumatic.  Nose: Nose normal.  Mouth/Throat: Oropharynx is clear and moist.  Eyes: Conjunctivae and EOM are normal. Pupils are equal, round, and reactive to light.  Neck: Normal range of motion. Neck supple. No JVD present. No tracheal deviation present. No thyromegaly present.  Cardiovascular: Normal rate, regular rhythm, normal heart sounds and intact distal pulses.  Exam reveals no gallop and no friction rub.   No murmur heard. Pulmonary/Chest: Effort  normal and breath sounds normal. No stridor. No respiratory distress. He has no wheezes. He has no rales. He exhibits no tenderness.  Abdominal: Soft. Bowel sounds are normal. He exhibits no distension and no mass. There is tenderness (10 to palpation and left lower cautery.  Patient has abdominal wall defect with small amount of pink tissue protruding.  This tissue has a small pinpoint hole that is extruding brown, foul-smelling feculent material..  Patient has induration and swelling a). There is no rebound and no guarding.  Musculoskeletal: Normal range of motion. He exhibits no edema and no tenderness.  Lymphadenopathy:    He has no cervical adenopathy.  Neurological: He is alert and oriented to person, place, and time. He exhibits normal muscle tone. Coordination normal.  Skin: Skin  is warm and dry. No rash noted. No erythema. No pallor.  Psychiatric: He has a normal mood and affect. His behavior is normal. Judgment and thought content normal.    ED Course  Procedures (including critical care time) Labs Review Labs Reviewed  CBC WITH DIFFERENTIAL - Abnormal; Notable for the following:    RBC 3.80 (*)    Hemoglobin 11.5 (*)    HCT 34.6 (*)    All other components within normal limits  BASIC METABOLIC PANEL - Abnormal; Notable for the following:    Glucose, Bld 107 (*)    BUN 24 (*)    Creatinine, Ser 1.39 (*)    GFR calc non Af Amer 48 (*)    GFR calc Af Amer 56 (*)    All other components within normal limits  WOUND CULTURE  URINALYSIS, ROUTINE W REFLEX MICROSCOPIC   Imaging Review No results found.  EKG Interpretation   None       MDM  No diagnosis found. 77 year old male with acute lower abdominal pain 4 week history of diverticulitis requiring drain.  Patient has persistent nonhealing wound from the drain site.  Concern for fistula or recurrent abscess in this area.  Plan for labs, pain control, CT abdomen pelvis, and surgery consult.  7:18 AM Care passed to Dr Audie Pinto awaiting CT scan results.   Kalman Drape, MD 04/10/13 [phone]

## 2013-04-10 NOTE — ED Notes (Signed)
Bed: [id number] Expected date:  Expected time:  Means of arrival:  Comments: EMS lower abd pain

## 2013-04-10 NOTE — ED Notes (Signed)
Patient transported to CT 

## 2013-04-12 ENCOUNTER — Other Ambulatory Visit (INDEPENDENT_AMBULATORY_CARE_PROVIDER_SITE_OTHER): Payer: Self-pay

## 2013-04-12 ENCOUNTER — Telehealth (INDEPENDENT_AMBULATORY_CARE_PROVIDER_SITE_OTHER): Payer: Self-pay

## 2013-04-12 ENCOUNTER — Telehealth (INDEPENDENT_AMBULATORY_CARE_PROVIDER_SITE_OTHER): Payer: Self-pay | Admitting: Surgery

## 2013-04-12 DIAGNOSIS — K5732 Diverticulitis of large intestine without perforation or abscess without bleeding: Secondary | ICD-10-CM

## 2013-04-12 MED ORDER — AMOXICILLIN-POT CLAVULANATE 875-125 MG PO TABS: 1.0000 | ORAL_TABLET | Freq: Two times a day (BID) | ORAL | Status: AC

## 2013-04-12 NOTE — Telephone Encounter (Signed)
Per Dr Gala Lewandowsky request Augmentin 875mg  #60  One po BID sent to [facility]. Pt advised to start antibiotic and stay on clear liquid diet for 3 days then resume normal diet. Pt will continue antibiotics until his January appt. Pt will call if pain does does not improve or other symptoms occur. Pt states he understands.

## 2013-04-12 NOTE — Telephone Encounter (Signed)
Pt walked into office wanting sooner appt with Dr Harlow Asa than January. Pt states he was advised to see Dr Harlow Asa this week. Pt advised Dr Harlow Asa is not in office this week.  I advised pt I have paged Dr Harlow Asa and he will review CT and call back with recommendation. Pt will have a seat and wait for call back from Dr Harlow Asa.

## 2013-04-12 NOTE — Telephone Encounter (Signed)
Patient presented to ER last week with diverticular disease and colocutaneous fistula.  Dr. Lucia Gaskins discussed management with ER physician.  Patient scheduled for follow up at [place] office with me in January 2015.  Patient walked into [facility] office today for evaluation.  I am out of office at [facility] as DOW all week.  I reviewed CT scan and ER record.  I reviewed my last encounter with patient in November 2013.  Will start Augmentin 875mg  po bid.  Asked patient to stay on clear liquid diet for 3 days now.  Will need sigmoid resection after acute inflammation subsides.  Plan to see and re-evaluate on 05/09/2013 in [place] office.  Earnstine Regal, MD, [facility] Office: [phone]

## 2013-04-13 LAB — WOUND CULTURE
Gram Stain: NONE SEEN
Special Requests: NORMAL

## 2013-04-27 DIAGNOSIS — Z950 Presence of cardiac pacemaker: Secondary | ICD-10-CM | POA: Diagnosis not present

## 2013-05-09 ENCOUNTER — Ambulatory Visit (INDEPENDENT_AMBULATORY_CARE_PROVIDER_SITE_OTHER): Payer: Medicare Other | Admitting: Surgery

## 2013-05-09 ENCOUNTER — Encounter (INDEPENDENT_AMBULATORY_CARE_PROVIDER_SITE_OTHER): Payer: Self-pay | Admitting: Surgery

## 2013-05-09 VITALS — BP 120/68 | HR 64 | Temp 99.0°F | Resp 14 | Ht 67.0 in | Wt 142.4 lb

## 2013-05-09 DIAGNOSIS — K632 Fistula of intestine: Secondary | ICD-10-CM | POA: Diagnosis not present

## 2013-05-09 DIAGNOSIS — K5732 Diverticulitis of large intestine without perforation or abscess without bleeding: Secondary | ICD-10-CM

## 2013-05-09 NOTE — Progress Notes (Signed)
General Surgery [facility]  Chief Complaint  Patient presents with  . Follow-up    diverticular disease with colocutaneous fistula    HISTORY: Patient is a 78 year old male known to my practice from the his history of complicated diverticular disease. Patient was hospitalized approximately one year ago with acute diverticulitis with abscess formation. He was treated with percutaneous drainage. Drain was subsequently removed. At that time we discussed elective resection of the sigmoid colon. Patient decided against surgical resection and we managed him expectantly.  Over the past year the patient has had intermittent drainage from the site of the percutaneous drain in the lower anterior abdominal wall. Over the past 2 months he has developed drainage which is sometimes feculent and has developed pain.  Patient underwent CT scan of the abdomen in December 2014 which showed a total of cutaneous fistula. Patient was started on a clear liquid diet and oral antibiotics and has had some improvement in his symptoms. He returns today for surgical evaluation and to discuss definitive surgery for treatment of colocutaneous fistula.  PERTINENT REVIEW OF SYSTEMS: Intermittent drainage lower abdominal wall consistent with colocutaneous fistula.  EXAM: HEENT: normocephalic; pupils equal and reactive; sclerae clear; dentition good; mucous membranes moist NECK:  symmetric on extension; no palpable anterior or posterior cervical lymphadenopathy; no supraclavicular masses; no tenderness CHEST: clear to auscultation bilaterally without rales, rhonchi, or wheezes CARDIAC: regular rate and rhythm without significant murmur; peripheral pulses are full ABD:  Abdomen is soft without distention. There are at least 2 fistulous openings in the lower abdominal wall just to the left of midline. There is surrounding induration and tenderness. EXT:  non-tender without edema; no deformity NEURO: no  gross focal deficits; no sign of tremor   IMPRESSION: #1 history of acute diverticulitis with perforation and abscess formation #2 subsequent development of colocutaneous fistula  PLAN: The patient and I discussed the above findings at length. It appears he now has a colocutaneous fistula with significant inflammatory changes. He has improved on liquid diet and oral antibiotics. He does require definitive surgical resection at this point.  I have recommended exploratory laparotomy with sigmoid colectomy. Patient will probably require a temporary end colostomy. Patient will also likely have an open abdominal wound which will need to heal by secondary intention.  I would like to proceed with surgery as soon as possible. We will keep him on oral antibiotics until the time of surgery. Patient and I have discussed the postoperative course to be anticipated, the hospital stay, and the need for a second surgical procedure to reestablish intestinal continuity at some point 3-4 months in the future.  The risks and benefits of the procedure have been discussed at length with the patient.  The patient understands the proposed procedure, potential alternative treatments, and the course of recovery to be expected.  All of the patient's questions have been answered at this time.  The patient wishes to proceed with surgery.  Earnstine Regal, MD, [facility] Office: [phone]  Visit Diagnoses: 1. Colocutaneous fistula   2. Diverticulitis of sigmoid colon

## 2013-05-09 NOTE — Patient Instructions (Signed)
[facility]  OPEN ABDOMINAL SURGERY: POST OP INSTRUCTIONS  Always review your discharge instruction sheet given to you by the facility where your surgery was performed.  1. A prescription for pain medication may be given to you upon discharge.  Take your pain medication as prescribed.  If narcotic pain medicine is not needed, then you may take acetaminophen (Tylenol) or ibuprofen (Advil) as needed. 2. Take your usually prescribed medications unless otherwise directed. 3. If you need a refill on your pain medication, please contact your pharmacy. They will contact our office to request authorization.  Prescriptions will not be filled after 5 pm or on weekends. 4. You should follow a light diet the first few days after arrival home, such as soup and crackers, unless your doctor has advised otherwise. A high-fiber, low fat diet can be resumed as tolerated.  Be sure to include plenty of fluids daily.  5. Most patients will experience some swelling and bruising in the area of the incision. Ice packs will help. Swelling and bruising can take several days to resolve. 6. It is common to experience some constipation if taking pain medication after surgery.  Increasing fluid intake and taking a stool softener will usually help or prevent this problem from occurring.  A mild laxative (Milk of Magnesia or Miralax) should be taken according to package directions if there are no bowel movements after 48 hours. 7.  You may have steri-strips (small skin tapes) in place directly over the incision.  These strips should be left on the skin for 7-10 days.  If your surgeon used skin glue on the incision, you may shower in 24 hours.  The glue will flake off over the next 2-3 weeks.  Any sutures or staples will be removed at the office during your follow-up visit. You may find that a light gauze bandage over your incision may keep your staples from being rubbed or pulled. You may shower and replace the bandage  daily. 8. ACTIVITIES:  You may resume regular (light) daily activities beginning the next day-such as daily self-care, walking, climbing stairs-gradually increasing activities as tolerated.  You may have sexual intercourse when it is comfortable.  Refrain from any heavy lifting or straining until approved by your doctor.  You may drive when you no longer are taking prescription pain medication, you can comfortably wear a seatbelt, and you can safely maneuver your car and apply brakes. 9. You should see your doctor in the office for a follow-up appointment approximately two weeks after your surgery.  Make sure that you call for this appointment within a day or two after you arrive home to insure a convenient appointment time.  WHEN TO CALL YOUR DOCTOR: 1. Fever greater than 101.0 2. Inability to urinate 3. Persistent nausea and/or vomiting 4. Extreme swelling or bruising 5. Continued bleeding from incision 6. Increased pain, redness, or drainage from the incision 7. Difficulty swallowing or breathing 8. Muscle cramping or spasms 9. Numbness or tingling in hands or around lips  IF YOU HAVE DISABILITY OR FAMILY LEAVE FORMS, YOU MUST BRING THEM TO THE OFFICE FOR PROCESSING.  PLEASE DO NOT GIVE THEM TO YOUR DOCTOR.  The clinic staff is available to answer your questions during regular business hours.  Please don't hesitate to call and ask to speak to one of the nurses if you have concerns.  [facility] [place] Office: [phone]  For further questions, please visit [email or web address]

## 2013-05-10 ENCOUNTER — Encounter (HOSPITAL_COMMUNITY)
Admission: RE | Admit: 2013-05-10 | Discharge: 2013-05-10 | Disposition: A | Discharge status: Home / Self Care | Payer: Medicare Other | Source: Ambulatory Visit | Attending: Surgery | Admitting: Surgery

## 2013-05-10 ENCOUNTER — Encounter (INDEPENDENT_AMBULATORY_CARE_PROVIDER_SITE_OTHER): Payer: Self-pay

## 2013-05-10 ENCOUNTER — Encounter (HOSPITAL_COMMUNITY): Payer: Self-pay | Admitting: Pharmacy Technician

## 2013-05-10 ENCOUNTER — Encounter (HOSPITAL_COMMUNITY): Payer: Self-pay

## 2013-05-10 DIAGNOSIS — E785 Hyperlipidemia, unspecified: Secondary | ICD-10-CM | POA: Diagnosis present

## 2013-05-10 DIAGNOSIS — I10 Essential (primary) hypertension: Secondary | ICD-10-CM | POA: Diagnosis not present

## 2013-05-10 DIAGNOSIS — I4892 Unspecified atrial flutter: Secondary | ICD-10-CM | POA: Diagnosis not present

## 2013-05-10 DIAGNOSIS — I4891 Unspecified atrial fibrillation: Secondary | ICD-10-CM | POA: Diagnosis not present

## 2013-05-10 DIAGNOSIS — K5732 Diverticulitis of large intestine without perforation or abscess without bleeding: Secondary | ICD-10-CM | POA: Diagnosis not present

## 2013-05-10 DIAGNOSIS — Z01818 Encounter for other preprocedural examination: Secondary | ICD-10-CM | POA: Insufficient documentation

## 2013-05-10 DIAGNOSIS — Z01812 Encounter for preprocedural laboratory examination: Secondary | ICD-10-CM

## 2013-05-10 DIAGNOSIS — C61 Malignant neoplasm of prostate: Secondary | ICD-10-CM | POA: Diagnosis not present

## 2013-05-10 DIAGNOSIS — K5730 Diverticulosis of large intestine without perforation or abscess without bleeding: Secondary | ICD-10-CM | POA: Diagnosis not present

## 2013-05-10 DIAGNOSIS — I495 Sick sinus syndrome: Secondary | ICD-10-CM | POA: Diagnosis not present

## 2013-05-10 DIAGNOSIS — K632 Fistula of intestine: Secondary | ICD-10-CM | POA: Diagnosis not present

## 2013-05-10 DIAGNOSIS — R339 Retention of urine, unspecified: Secondary | ICD-10-CM | POA: Diagnosis not present

## 2013-05-10 DIAGNOSIS — Z950 Presence of cardiac pacemaker: Secondary | ICD-10-CM | POA: Diagnosis not present

## 2013-05-10 DIAGNOSIS — Z8546 Personal history of malignant neoplasm of prostate: Secondary | ICD-10-CM | POA: Diagnosis not present

## 2013-05-10 LAB — BASIC METABOLIC PANEL
BUN: 37 mg/dL — AB (ref 6–23)
CO2: 28 meq/L (ref 19–32)
Calcium: 9.5 mg/dL (ref 8.4–10.5)
Chloride: 101 mEq/L (ref 96–112)
Creatinine, Ser: 1.45 mg/dL — ABNORMAL HIGH (ref 0.50–1.35)
GFR calc Af Amer: 53 mL/min — ABNORMAL LOW (ref >90)
GFR calc non Af Amer: 46 mL/min — ABNORMAL LOW (ref >90)
Glucose, Bld: 106 mg/dL — ABNORMAL HIGH (ref 70–99)
Potassium: 4.6 mEq/L (ref 3.7–5.3)
Sodium: 141 mEq/L (ref 137–147)

## 2013-05-10 LAB — ABO/RH: ABO/RH(D): O POS

## 2013-05-10 LAB — CBC
HEMATOCRIT: 39.2 % (ref 39.0–52.0)
Hemoglobin: 12.6 g/dL — ABNORMAL LOW (ref 13.0–17.0)
MCH: 29.6 pg (ref 26.0–34.0)
MCHC: 32.1 g/dL (ref 30.0–36.0)
MCV: 92.2 fL (ref 78.0–100.0)
PLATELETS: 349 10*3/uL (ref 150–400)
RBC: 4.25 MIL/uL (ref 4.22–5.81)
RDW: 13.8 % (ref 11.5–15.5)
WBC: 6.8 10*3/uL (ref 4.0–10.5)

## 2013-05-10 NOTE — Progress Notes (Signed)
Faxed BMP to Dr Harlow Asa via [employer]

## 2013-05-10 NOTE — Consult Note (Addendum)
WOC wound consult note Reason for Consult: Consult requested for stoma site marking during pre-surgical appointment. Pt scheduled for colostomy surgery on 1/9.  Briefly discussed pouching routines and demonstrated pouch appearance. Educational materials given to patient and he asked appropriate questions.   Mark placed within line of vision and within rectus muscles, in area free from folds in LLQ. Stoma site marked 5 cm to the left of the umbilicus and 2 cm below.  Marker given to pt and instructed to color back in if it fades prior to surgery.  Site is below where pt currently wears belt, and should not be moved any lower due to location of skin folds in that region.  Assessed abd while standing and sitting.  Pt has bulging area to midline abd with dressing which was also avoided.  [place] team will plan to follow post-op for teaching sessions when pt is stable. Julien Girt MSN, RN, [place], [place], [place]

## 2013-05-10 NOTE — Progress Notes (Signed)
States has bowel prep instructions.  Seen by ostomy nurse in PST

## 2013-05-10 NOTE — Patient Instructions (Addendum)
[place]  05/10/2013   Your procedure is scheduled on:  05/13/13 FRIDAY  Report to [facility] at    [place]   AM.  Call this number if you have problems the morning of surgery: [phone]       Remember: BOWEL PREP AS PER OFFICE  Do not eat TAKE ANYTHING BY MOUTH AFTER MIDNIGHT Thursday NIGHT   Take these medicines the morning of surgery with A SIP OF WATER:  NONE   .  Contacts, dentures or partial plates can not be worn to surgery  Leave suitcase in the car. After surgery it may be brought to your room.  For patients admitted to the hospital, checkout time is 11:00 AM day of  discharge.             SPECIAL INSTRUCTIONS- SEE [place] PREPARING FOR SURGERY INSTRUCTION SHEET-     DO NOT WEAR JEWELRY, LOTIONS, POWDERS, OR PERFUMES.  WOMEN-- DO NOT SHAVE LEGS OR UNDERARMS FOR 12 HOURS BEFORE SHOWERS. MEN MAY SHAVE FACE.  Patients discharged the day of surgery will not be allowed to drive home. IF going home the day of surgery, you must have a driver and someone to stay with you for the first 24 hours  Name and phone number of your driver:  ADMISSION                                                                      Please read over the following fact sheets that you were given:  Blood Transfusion Sheet  Information                                                                                   [name] [name]  PST [phone]  [id number]                 [place]                                                  Patient Signature _____________________________

## 2013-05-10 NOTE — Progress Notes (Signed)
TEE, stress test 1/13 CHART,  OV, EKG, pacer interrogation note Dr Einar Gip 9/14 chart

## 2013-05-11 NOTE — Progress Notes (Signed)
Quick Note:  These results are acceptable for scheduled surgery.  Frieda Arnall M. Loralie Malta, MD, FACS [facility] Office: [phone]   ______ 

## 2013-05-12 NOTE — Progress Notes (Signed)
Last pacemaker interrogation 12/14 chart.  Cardiac Device orders received and placed on chart

## 2013-05-13 ENCOUNTER — Encounter (HOSPITAL_COMMUNITY): Payer: Medicare Other | Admitting: Anesthesiology

## 2013-05-13 ENCOUNTER — Inpatient Hospital Stay (HOSPITAL_COMMUNITY)
Admission: RE | Admit: 2013-05-13 | Discharge: 2013-05-19 | DRG: 330 | Disposition: A | Discharge status: Home Health | Payer: Medicare Other | Source: Ambulatory Visit | Attending: Surgery | Admitting: Surgery

## 2013-05-13 ENCOUNTER — Encounter (HOSPITAL_COMMUNITY)
Admission: RE | Disposition: A | Discharge status: Home Health | Payer: Self-pay | Source: Ambulatory Visit | Attending: Surgery

## 2013-05-13 ENCOUNTER — Encounter (HOSPITAL_COMMUNITY): Payer: Self-pay | Admitting: *Deleted

## 2013-05-13 ENCOUNTER — Inpatient Hospital Stay (HOSPITAL_COMMUNITY): Payer: Medicare Other | Admitting: Anesthesiology

## 2013-05-13 DIAGNOSIS — K632 Fistula of intestine: Secondary | ICD-10-CM

## 2013-05-13 DIAGNOSIS — Z950 Presence of cardiac pacemaker: Secondary | ICD-10-CM

## 2013-05-13 DIAGNOSIS — I10 Essential (primary) hypertension: Secondary | ICD-10-CM | POA: Diagnosis present

## 2013-05-13 DIAGNOSIS — K5730 Diverticulosis of large intestine without perforation or abscess without bleeding: Secondary | ICD-10-CM

## 2013-05-13 DIAGNOSIS — Z8546 Personal history of malignant neoplasm of prostate: Secondary | ICD-10-CM

## 2013-05-13 DIAGNOSIS — I495 Sick sinus syndrome: Secondary | ICD-10-CM | POA: Diagnosis present

## 2013-05-13 DIAGNOSIS — K5732 Diverticulitis of large intestine without perforation or abscess without bleeding: Secondary | ICD-10-CM | POA: Diagnosis present

## 2013-05-13 DIAGNOSIS — R339 Retention of urine, unspecified: Secondary | ICD-10-CM | POA: Diagnosis not present

## 2013-05-13 DIAGNOSIS — I4892 Unspecified atrial flutter: Secondary | ICD-10-CM | POA: Diagnosis present

## 2013-05-13 DIAGNOSIS — Z01812 Encounter for preprocedural laboratory examination: Secondary | ICD-10-CM

## 2013-05-13 DIAGNOSIS — K5790 Diverticulosis of intestine, part unspecified, without perforation or abscess without bleeding: Secondary | ICD-10-CM | POA: Diagnosis present

## 2013-05-13 DIAGNOSIS — E785 Hyperlipidemia, unspecified: Secondary | ICD-10-CM | POA: Diagnosis present

## 2013-05-13 HISTORY — PX: COLON SURGERY: SHX602

## 2013-05-13 HISTORY — PX: PARTIAL COLECTOMY: SHX5273

## 2013-05-13 LAB — CREATININE, SERUM
Creatinine, Ser: 1.46 mg/dL — ABNORMAL HIGH (ref 0.50–1.35)
GFR calc non Af Amer: 45 mL/min — ABNORMAL LOW (ref >90)
GFR, EST AFRICAN AMERICAN: 52 mL/min — AB (ref >90)

## 2013-05-13 LAB — TYPE AND SCREEN
ABO/RH(D): O POS
Antibody Screen: NEGATIVE

## 2013-05-13 LAB — CBC
HCT: 35.7 % — ABNORMAL LOW (ref 39.0–52.0)
Hemoglobin: 12 g/dL — ABNORMAL LOW (ref 13.0–17.0)
MCH: 30.3 pg (ref 26.0–34.0)
MCHC: 33.6 g/dL (ref 30.0–36.0)
MCV: 90.2 fL (ref 78.0–100.0)
Platelets: 293 10*3/uL (ref 150–400)
RBC: 3.96 MIL/uL — ABNORMAL LOW (ref 4.22–5.81)
RDW: 13.2 % (ref 11.5–15.5)
WBC: 12.7 10*3/uL — ABNORMAL HIGH (ref 4.0–10.5)

## 2013-05-13 SURGERY — COLECTOMY, PARTIAL
Anesthesia: General | Site: Abdomen

## 2013-05-13 MED ORDER — HYDROMORPHONE 0.3 MG/ML IV SOLN
INTRAVENOUS | Status: DC
  Administered 2013-05-13: 12:00:00 via INTRAVENOUS
  Administered 2013-05-14 (×6): 0.2 mg via INTRAVENOUS
  Administered 2013-05-15: 0.199 mg via INTRAVENOUS
  Administered 2013-05-15 (×2): 0.2 mg via INTRAVENOUS
  Administered 2013-05-16: 0.4 mg via INTRAVENOUS
  Administered 2013-05-16: 0.599 mg via INTRAVENOUS
  Administered 2013-05-16: 0.4 mg via INTRAVENOUS
  Administered 2013-05-16: 0.39 mg via INTRAVENOUS
  Administered 2013-05-16: 0.4 mg via INTRAVENOUS
  Administered 2013-05-16: 0.5999 mg via INTRAVENOUS
  Administered 2013-05-17 (×2): 0.2 mg via INTRAVENOUS
  Administered 2013-05-17: 0.6 mg via INTRAVENOUS
  Administered 2013-05-17 (×2): 0.599 mg via INTRAVENOUS
  Administered 2013-05-18: 0.799 mg via INTRAVENOUS
  Administered 2013-05-18: 0.2 mg via INTRAVENOUS
  Filled 2013-05-13: qty 25

## 2013-05-13 MED ORDER — ROCURONIUM BROMIDE 100 MG/10ML IV SOLN
INTRAVENOUS | Status: AC
  Filled 2013-05-13: qty 1

## 2013-05-13 MED ORDER — LABETALOL HCL 5 MG/ML IV SOLN
INTRAVENOUS | Status: AC
  Filled 2013-05-13: qty 4

## 2013-05-13 MED ORDER — NALOXONE HCL 0.4 MG/ML IJ SOLN: 0.4000 mg | INTRAMUSCULAR | Status: DC | PRN

## 2013-05-13 MED ORDER — HYDRALAZINE HCL 20 MG/ML IJ SOLN
INTRAMUSCULAR | Status: AC
  Filled 2013-05-13: qty 1

## 2013-05-13 MED ORDER — GLYCOPYRROLATE 0.2 MG/ML IJ SOLN
INTRAMUSCULAR | Status: DC | PRN
  Administered 2013-05-13: 0.6 mg via INTRAVENOUS

## 2013-05-13 MED ORDER — ONDANSETRON HCL 4 MG/2ML IJ SOLN: 4.0000 mg | Freq: Four times a day (QID) | INTRAMUSCULAR | Status: DC | PRN

## 2013-05-13 MED ORDER — SODIUM CHLORIDE 0.9 % IV SOLN
1.0000 g | INTRAVENOUS | Status: AC
  Administered 2013-05-13: 1 g via INTRAVENOUS
  Filled 2013-05-13: qty 1

## 2013-05-13 MED ORDER — ROCURONIUM BROMIDE 100 MG/10ML IV SOLN
INTRAVENOUS | Status: DC | PRN
  Administered 2013-05-13: 25 mg via INTRAVENOUS
  Administered 2013-05-13: 5 mg via INTRAVENOUS
  Administered 2013-05-13: 10 mg via INTRAVENOUS

## 2013-05-13 MED ORDER — HYDROMORPHONE HCL PF 1 MG/ML IJ SOLN
INTRAMUSCULAR | Status: AC
  Filled 2013-05-13: qty 1

## 2013-05-13 MED ORDER — FENTANYL CITRATE 0.05 MG/ML IJ SOLN
INTRAMUSCULAR | Status: DC | PRN
Start: 2013-05-13 — End: 2013-05-13
  Administered 2013-05-13 (×3): 50 ug via INTRAVENOUS
  Administered 2013-05-13: 100 ug via INTRAVENOUS

## 2013-05-13 MED ORDER — GLYCOPYRROLATE 0.2 MG/ML IJ SOLN
INTRAMUSCULAR | Status: AC
  Filled 2013-05-13: qty 3

## 2013-05-13 MED ORDER — DIPHENHYDRAMINE HCL 50 MG/ML IJ SOLN: 12.5000 mg | Freq: Four times a day (QID) | INTRAMUSCULAR | Status: DC | PRN

## 2013-05-13 MED ORDER — SODIUM CHLORIDE 0.9 % IV SOLN
INTRAVENOUS | Status: AC
  Filled 2013-05-13: qty 1

## 2013-05-13 MED ORDER — TAMSULOSIN HCL 0.4 MG PO CAPS
0.4000 mg | ORAL_CAPSULE | Freq: Two times a day (BID) | ORAL | Status: DC
  Administered 2013-05-13 – 2013-05-17 (×9): 0.4 mg via ORAL
  Filled 2013-05-13 (×11): qty 1

## 2013-05-13 MED ORDER — ONDANSETRON HCL 4 MG/2ML IJ SOLN
INTRAMUSCULAR | Status: DC | PRN
  Administered 2013-05-13: 4 mg via INTRAVENOUS

## 2013-05-13 MED ORDER — HYDROMORPHONE HCL PF 1 MG/ML IJ SOLN
INTRAMUSCULAR | Status: DC | PRN
  Administered 2013-05-13 (×2): 0.5 mg via INTRAVENOUS
  Administered 2013-05-13: 1 mg via INTRAVENOUS

## 2013-05-13 MED ORDER — LABETALOL HCL 5 MG/ML IV SOLN
INTRAVENOUS | Status: DC | PRN
  Administered 2013-05-13 (×4): 5 mg via INTRAVENOUS

## 2013-05-13 MED ORDER — HYDROMORPHONE 0.3 MG/ML IV SOLN
INTRAVENOUS | Status: AC
  Administered 2013-05-13: 0.2 mg via INTRAVENOUS
  Filled 2013-05-13: qty 25

## 2013-05-13 MED ORDER — IRBESARTAN 300 MG PO TABS
300.0000 mg | ORAL_TABLET | Freq: Every day | ORAL | Status: DC
  Administered 2013-05-13 – 2013-05-19 (×7): 300 mg via ORAL
  Filled 2013-05-13 (×7): qty 1

## 2013-05-13 MED ORDER — LIDOCAINE HCL (CARDIAC) 20 MG/ML IV SOLN
INTRAVENOUS | Status: DC | PRN
  Administered 2013-05-13: 50 mg via INTRAVENOUS

## 2013-05-13 MED ORDER — OLMESARTAN MEDOXOMIL-HCTZ 40-12.5 MG PO TABS: 1.0000 | ORAL_TABLET | Freq: Every day | ORAL | Status: DC

## 2013-05-13 MED ORDER — DIPHENHYDRAMINE HCL 12.5 MG/5ML PO ELIX: 12.5000 mg | ORAL_SOLUTION | Freq: Four times a day (QID) | ORAL | Status: DC | PRN

## 2013-05-13 MED ORDER — NEOSTIGMINE METHYLSULFATE 1 MG/ML IJ SOLN
INTRAMUSCULAR | Status: AC
  Filled 2013-05-13: qty 10

## 2013-05-13 MED ORDER — LIDOCAINE HCL (CARDIAC) 20 MG/ML IV SOLN
INTRAVENOUS | Status: AC
  Filled 2013-05-13: qty 5

## 2013-05-13 MED ORDER — HYDROCHLOROTHIAZIDE 12.5 MG PO CAPS
12.5000 mg | ORAL_CAPSULE | Freq: Every day | ORAL | Status: DC
  Administered 2013-05-13 – 2013-05-19 (×7): 12.5 mg via ORAL
  Filled 2013-05-13 (×7): qty 1

## 2013-05-13 MED ORDER — HYDROMORPHONE HCL PF 1 MG/ML IJ SOLN
0.2500 mg | INTRAMUSCULAR | Status: DC | PRN
  Administered 2013-05-13 (×2): 0.5 mg via INTRAVENOUS

## 2013-05-13 MED ORDER — DEXAMETHASONE SODIUM PHOSPHATE 10 MG/ML IJ SOLN
INTRAMUSCULAR | Status: AC
  Filled 2013-05-13: qty 1

## 2013-05-13 MED ORDER — NEOSTIGMINE METHYLSULFATE 1 MG/ML IJ SOLN
INTRAMUSCULAR | Status: DC | PRN
  Administered 2013-05-13: 4 mg via INTRAVENOUS

## 2013-05-13 MED ORDER — HYDRALAZINE HCL 20 MG/ML IJ SOLN
INTRAMUSCULAR | Status: DC | PRN
  Administered 2013-05-13: 5 mg via INTRAVENOUS

## 2013-05-13 MED ORDER — LACTATED RINGERS IV SOLN
INTRAVENOUS | Status: DC
  Administered 2013-05-13: 10:00:00 via INTRAVENOUS
  Administered 2013-05-13: 1000 mL via INTRAVENOUS
  Administered 2013-05-13: 11:00:00 via INTRAVENOUS

## 2013-05-13 MED ORDER — DEXAMETHASONE SODIUM PHOSPHATE 10 MG/ML IJ SOLN
INTRAMUSCULAR | Status: DC | PRN
  Administered 2013-05-13: 10 mg via INTRAVENOUS

## 2013-05-13 MED ORDER — PROPOFOL 10 MG/ML IV BOLUS
INTRAVENOUS | Status: AC
  Filled 2013-05-13: qty 20

## 2013-05-13 MED ORDER — HYDROMORPHONE HCL PF 2 MG/ML IJ SOLN
INTRAMUSCULAR | Status: AC
  Filled 2013-05-13: qty 1

## 2013-05-13 MED ORDER — ONDANSETRON HCL 4 MG/2ML IJ SOLN
INTRAMUSCULAR | Status: AC
  Filled 2013-05-13: qty 2

## 2013-05-13 MED ORDER — SUCCINYLCHOLINE CHLORIDE 20 MG/ML IJ SOLN
INTRAMUSCULAR | Status: DC | PRN
  Administered 2013-05-13: 100 mg via INTRAVENOUS

## 2013-05-13 MED ORDER — SODIUM CHLORIDE 0.9 % IJ SOLN: 9.0000 mL | INTRAMUSCULAR | Status: DC | PRN

## 2013-05-13 MED ORDER — FENTANYL CITRATE 0.05 MG/ML IJ SOLN
INTRAMUSCULAR | Status: AC
  Filled 2013-05-13: qty 5

## 2013-05-13 MED ORDER — PROMETHAZINE HCL 25 MG/ML IJ SOLN: 6.2500 mg | INTRAMUSCULAR | Status: DC | PRN

## 2013-05-13 MED ORDER — ENOXAPARIN SODIUM 40 MG/0.4ML ~~LOC~~ SOLN
40.0000 mg | SUBCUTANEOUS | Status: DC
  Administered 2013-05-14: 11:00:00 40 mg via SUBCUTANEOUS
  Filled 2013-05-13 (×2): qty 0.4

## 2013-05-13 MED ORDER — KCL IN DEXTROSE-NACL 30-5-0.45 MEQ/L-%-% IV SOLN
INTRAVENOUS | Status: DC
  Administered 2013-05-13 – 2013-05-17 (×7): via INTRAVENOUS
  Filled 2013-05-13 (×15): qty 1000

## 2013-05-13 MED ORDER — PROPOFOL 10 MG/ML IV BOLUS
INTRAVENOUS | Status: DC | PRN
  Administered 2013-05-13: 160 mg via INTRAVENOUS

## 2013-05-13 SURGICAL SUPPLY — 54 items
APPLICATOR COTTON TIP 6IN STRL (MISCELLANEOUS) ×3 IMPLANT
BLADE EXTENDED COATED 6.5IN (ELECTRODE) ×3 IMPLANT
BLADE HEX COATED 2.75 (ELECTRODE) ×6 IMPLANT
BLADE SURG SZ10 CARB STEEL (BLADE) IMPLANT
CHLORAPREP W/TINT 26ML (MISCELLANEOUS) ×3 IMPLANT
CLAMP POUCH DRAINAGE QUIET (OSTOMY) ×3 IMPLANT
COUNTER NEEDLE 20 DBL MAG RED (NEEDLE) ×3 IMPLANT
COVER MAYO STAND STRL (DRAPES) ×6 IMPLANT
DRAPE LAPAROSCOPIC ABDOMINAL (DRAPES) ×3 IMPLANT
DRAPE LG THREE QUARTER DISP (DRAPES) ×3 IMPLANT
DRAPE UTILITY XL STRL (DRAPES) ×6 IMPLANT
DRAPE WARM FLUID 44X44 (DRAPE) ×3 IMPLANT
DRSG OPSITE POSTOP 4X10 (GAUZE/BANDAGES/DRESSINGS) IMPLANT
DRSG OPSITE POSTOP 4X6 (GAUZE/BANDAGES/DRESSINGS) IMPLANT
DRSG OPSITE POSTOP 4X8 (GAUZE/BANDAGES/DRESSINGS) IMPLANT
ELECT REM PT RETURN 9FT ADLT (ELECTROSURGICAL) ×3
ELECTRODE REM PT RTRN 9FT ADLT (ELECTROSURGICAL) ×1 IMPLANT
GLOVE SURG ORTHO 8.0 STRL STRW (GLOVE) ×6 IMPLANT
GOWN STRL REUS W/TWL XL LVL3 (GOWN DISPOSABLE) ×12 IMPLANT
KIT BASIN OR (CUSTOM PROCEDURE TRAY) ×3 IMPLANT
KIT COLOSTOMY ILEOSTOMY 2.75 (WOUND CARE) ×3 IMPLANT
LEGGING LITHOTOMY PAIR STRL (DRAPES) IMPLANT
LIGASURE IMPACT 36 18CM CVD LR (INSTRUMENTS) ×3 IMPLANT
LUBRICANT JELLY K Y 4OZ (MISCELLANEOUS) IMPLANT
PACK GENERAL/GYN (CUSTOM PROCEDURE TRAY) ×3 IMPLANT
PAD ABD 8X10 STRL (GAUZE/BANDAGES/DRESSINGS) IMPLANT
PENCIL BUTTON HOLSTER BLD 10FT (ELECTRODE) ×3 IMPLANT
RELOAD PROXIMATE 75MM BLUE (ENDOMECHANICALS) ×3 IMPLANT
SPONGE GAUZE 4X4 12PLY (GAUZE/BANDAGES/DRESSINGS) IMPLANT
SPONGE LAP 18X18 X RAY DECT (DISPOSABLE) ×3 IMPLANT
STAPLER PROXIMATE 75MM BLUE (STAPLE) ×3 IMPLANT
STAPLER VISISTAT 35W (STAPLE) ×3 IMPLANT
SUCTION POOLE TIP (SUCTIONS) ×3 IMPLANT
SUT NOV 1 T60/GS (SUTURE) IMPLANT
SUT NOVA NAB DX-16 0-1 5-0 T12 (SUTURE) ×6 IMPLANT
SUT NOVA T20/GS 25 (SUTURE) IMPLANT
SUT PDS AB 1 TP1 96 (SUTURE) IMPLANT
SUT PROLENE 2 0 CT2 30 (SUTURE) ×3 IMPLANT
SUT SILK 2 0 (SUTURE) ×4
SUT SILK 2 0 SH CR/8 (SUTURE) ×3 IMPLANT
SUT SILK 2 0SH CR/8 30 (SUTURE) IMPLANT
SUT SILK 2-0 18XBRD TIE 12 (SUTURE) ×1 IMPLANT
SUT SILK 2-0 30XBRD TIE 12 (SUTURE) ×1 IMPLANT
SUT SILK 3 0 (SUTURE) ×2
SUT SILK 3 0 SH CR/8 (SUTURE) ×3 IMPLANT
SUT SILK 3-0 18XBRD TIE 12 (SUTURE) ×1 IMPLANT
SUT VIC AB 3-0 SH 18 (SUTURE) ×3 IMPLANT
TOWEL OR 17X26 10 PK STRL BLUE (TOWEL DISPOSABLE) ×6 IMPLANT
TOWEL OR NON WOVEN STRL DISP B (DISPOSABLE) ×6 IMPLANT
TRAY FOLEY CATH 14FRSI W/METER (CATHETERS) IMPLANT
TRAY FOLEY CATH 16FRSI W/METER (SET/KITS/TRAYS/PACK) ×3 IMPLANT
TUBING CONNECTING 10 (TUBING) IMPLANT
TUBING CONNECTING 10' (TUBING)
YANKAUER SUCT BULB TIP 10FT TU (MISCELLANEOUS) ×3 IMPLANT

## 2013-05-13 NOTE — Anesthesia Postprocedure Evaluation (Signed)
  Anesthesia Post-op Note  Patient: [name] [name]  Procedure(s) Performed: Procedure(s) (LRB): sigmoid COLECTOMY  colostomy  (N/A)  Patient Location: PACU  Anesthesia Type: General  Level of Consciousness: awake and alert   Airway and Oxygen Therapy: Patient Spontanous Breathing  Post-op Pain: mild  Post-op Assessment: Post-op Vital signs reviewed, Patient's Cardiovascular Status Stable, Respiratory Function Stable, Patent Airway and No signs of Nausea or vomiting  Last Vitals:  Filed Vitals:   05/13/13 1115  BP:   Pulse:   Temp: 36.9 C  Resp:     Post-op Vital Signs: stable   Complications: No apparent anesthesia complications

## 2013-05-13 NOTE — H&P (View-Only) (Signed)
General Surgery [facility]  Chief Complaint  Patient presents with  . Follow-up    diverticular disease with colocutaneous fistula    HISTORY: Patient is a 78 year old male known to my practice from the his history of complicated diverticular disease. Patient was hospitalized approximately one year ago with acute diverticulitis with abscess formation. He was treated with percutaneous drainage. Drain was subsequently removed. At that time we discussed elective resection of the sigmoid colon. Patient decided against surgical resection and we managed him expectantly.  Over the past year the patient has had intermittent drainage from the site of the percutaneous drain in the lower anterior abdominal wall. Over the past 2 months he has developed drainage which is sometimes feculent and has developed pain.  Patient underwent CT scan of the abdomen in December 2014 which showed a total of cutaneous fistula. Patient was started on a clear liquid diet and oral antibiotics and has had some improvement in his symptoms. He returns today for surgical evaluation and to discuss definitive surgery for treatment of colocutaneous fistula.  PERTINENT REVIEW OF SYSTEMS: Intermittent drainage lower abdominal wall consistent with colocutaneous fistula.  EXAM: HEENT: normocephalic; pupils equal and reactive; sclerae clear; dentition good; mucous membranes moist NECK:  symmetric on extension; no palpable anterior or posterior cervical lymphadenopathy; no supraclavicular masses; no tenderness CHEST: clear to auscultation bilaterally without rales, rhonchi, or wheezes CARDIAC: regular rate and rhythm without significant murmur; peripheral pulses are full ABD:  Abdomen is soft without distention. There are at least 2 fistulous openings in the lower abdominal wall just to the left of midline. There is surrounding induration and tenderness. EXT:  non-tender without edema; no deformity NEURO: no  gross focal deficits; no sign of tremor   IMPRESSION: #1 history of acute diverticulitis with perforation and abscess formation #2 subsequent development of colocutaneous fistula  PLAN: The patient and I discussed the above findings at length. It appears he now has a colocutaneous fistula with significant inflammatory changes. He has improved on liquid diet and oral antibiotics. He does require definitive surgical resection at this point.  I have recommended exploratory laparotomy with sigmoid colectomy. Patient will probably require a temporary end colostomy. Patient will also likely have an open abdominal wound which will need to heal by secondary intention.  I would like to proceed with surgery as soon as possible. We will keep him on oral antibiotics until the time of surgery. Patient and I have discussed the postoperative course to be anticipated, the hospital stay, and the need for a second surgical procedure to reestablish intestinal continuity at some point 3-4 months in the future.  The risks and benefits of the procedure have been discussed at length with the patient.  The patient understands the proposed procedure, potential alternative treatments, and the course of recovery to be expected.  All of the patient's questions have been answered at this time.  The patient wishes to proceed with surgery.  Earnstine Regal, MD, [facility] Office: [phone]  Visit Diagnoses: 1. Colocutaneous fistula   2. Diverticulitis of sigmoid colon

## 2013-05-13 NOTE — Interval H&P Note (Signed)
History and Physical Interval Note:  05/13/2013 9:08 AM  [name] [name]  has presented today for surgery, with the diagnosis of diverticular disease with colocutaneous fistula.  The various methods of treatment have been discussed with the patient and family. After consideration of risks, benefits and other options for treatment, the patient has consented to    Procedure(s): sigmoid COLECTOMY  colostomy  (N/A) as a surgical intervention .    The patient's history has been reviewed, patient examined, no change in status, stable for surgery.  I have reviewed the patient's chart and labs.  Questions were answered to the patient's satisfaction.    Earnstine Regal, MD, [facility] Office: [place]

## 2013-05-13 NOTE — Progress Notes (Signed)
Patient states doctor reviewed blood transfusion information with him risk/benefits. Consent signed.

## 2013-05-13 NOTE — Anesthesia Preprocedure Evaluation (Addendum)
Anesthesia Evaluation  Patient identified by MRN, date of birth, ID band Patient awake    Reviewed: Allergy & Precautions, H&P , NPO status , Patient's Chart, lab work & pertinent test results  Airway Mallampati: II TM Distance: >3 FB Neck ROM: Full    Dental no notable dental hx.    Pulmonary former smoker,  breath sounds clear to auscultation  Pulmonary exam normal       Cardiovascular hypertension, + dysrhythmias Atrial Fibrillation + pacemaker + Valvular Problems/Murmurs Rhythm:Regular Rate:Normal  H/O sick sinus syndrome.  Pacemaker prescription on chart, Dr. Einar Gip   Neuro/Psych negative neurological ROS  negative psych ROS   GI/Hepatic negative GI ROS, Neg liver ROS,   Endo/Other  negative endocrine ROS  Renal/GU Renal InsufficiencyRenal disease  negative genitourinary   Musculoskeletal negative musculoskeletal ROS (+)   Abdominal   Peds negative pediatric ROS (+)  Hematology negative hematology ROS (+)   Anesthesia Other Findings   Reproductive/Obstetrics negative OB ROS                         Anesthesia Physical Anesthesia Plan  ASA: III  Anesthesia Plan: General   Post-op Pain Management:    Induction: Intravenous  Airway Management Planned: Oral ETT  Additional Equipment:   Intra-op Plan:   Post-operative Plan: Extubation in OR  Informed Consent: I have reviewed the patients History and Physical, chart, labs and discussed the procedure including the risks, benefits and alternatives for the proposed anesthesia with the patient or authorized representative who has indicated his/her understanding and acceptance.   Dental advisory given  Plan Discussed with: CRNA  Anesthesia Plan Comments:        Anesthesia Quick Evaluation

## 2013-05-13 NOTE — Brief Op Note (Signed)
05/13/2013  11:03 AM  PATIENT:  [name] [name]  78 y.o. male  PRE-OPERATIVE DIAGNOSIS:  diverticular disease with colocutaneous fistula   POST-OPERATIVE DIAGNOSIS:  diverticular disease with colocutaneous fistula   PROCEDURE:  Procedure(s): sigmoid COLECTOMY  colostomy  (N/A)  SURGEON:  Surgeon(s) and Role:    * Earnstine Regal, MD - Primary  ANESTHESIA:   general  EBL:  Total I/O In: 2000 [I.V.:2000] Out: 200 [Urine:150; Blood:50]  BLOOD ADMINISTERED:none  DRAINS: none   LOCAL MEDICATIONS USED:  NONE  SPECIMEN:  Excision  DISPOSITION OF SPECIMEN:  PATHOLOGY  COUNTS:  YES  TOURNIQUET:  * No tourniquets in log *  DICTATION: .Other Dictation: Dictation Number [id number]  PLAN OF CARE: Admit to inpatient   PATIENT DISPOSITION:  PACU - hemodynamically stable.   Delay start of Pharmacological VTE agent (>24hrs) due to surgical blood loss or risk of bleeding: yes  Earnstine Regal, MD, [facility] Office: [phone]

## 2013-05-13 NOTE — Transfer of Care (Signed)
Immediate Anesthesia Transfer of Care Note  Patient: [name] [name]  Procedure(s) Performed: Procedure(s): sigmoid COLECTOMY  colostomy  (N/A)  Patient Location: PACU  Anesthesia Type:General  Level of Consciousness: awake, alert  and oriented  Airway & Oxygen Therapy: Patient Spontanous Breathing and Patient connected to face mask oxygen  Post-op Assessment: Report given to PACU RN and Post -op Vital signs reviewed and stable  Post vital signs: Reviewed and stable  Complications: No apparent anesthesia complications

## 2013-05-14 LAB — BASIC METABOLIC PANEL
BUN: 28 mg/dL — ABNORMAL HIGH (ref 6–23)
CO2: 26 mEq/L (ref 19–32)
Calcium: 8.5 mg/dL (ref 8.4–10.5)
Chloride: 95 mEq/L — ABNORMAL LOW (ref 96–112)
Creatinine, Ser: 1.62 mg/dL — ABNORMAL HIGH (ref 0.50–1.35)
GFR, EST AFRICAN AMERICAN: 46 mL/min — AB (ref >90)
GFR, EST NON AFRICAN AMERICAN: 40 mL/min — AB (ref >90)
Glucose, Bld: 212 mg/dL — ABNORMAL HIGH (ref 70–99)
POTASSIUM: 4.7 meq/L (ref 3.7–5.3)
Sodium: 134 mEq/L — ABNORMAL LOW (ref 137–147)

## 2013-05-14 LAB — CBC
HCT: 35.8 % — ABNORMAL LOW (ref 39.0–52.0)
Hemoglobin: 11.9 g/dL — ABNORMAL LOW (ref 13.0–17.0)
MCH: 29.4 pg (ref 26.0–34.0)
MCHC: 33.2 g/dL (ref 30.0–36.0)
MCV: 88.4 fL (ref 78.0–100.0)
Platelets: 299 10*3/uL (ref 150–400)
RBC: 4.05 MIL/uL — ABNORMAL LOW (ref 4.22–5.81)
RDW: 13.2 % (ref 11.5–15.5)
WBC: 16.1 10*3/uL — ABNORMAL HIGH (ref 4.0–10.5)

## 2013-05-14 NOTE — Op Note (Signed)
[name] [name], [name] [name]                 ACCOUNT NO.:  [id number]  MEDICAL RECORD NO.:  [id number]  LOCATION:  [id number]                         FACILITY:  [facility]  PHYSICIAN:  Earnstine Regal, MD      DATE OF BIRTH:  05/08/1935  DATE OF PROCEDURE:  05/13/2013                               OPERATIVE REPORT   PREOPERATIVE DIAGNOSIS:  Diverticular disease with colocutaneous fistula.  POSTOPERATIVE DIAGNOSIS:  Diverticular disease with colocutaneous fistula.  PROCEDURES: 1. Exploratory laparotomy. 2. Sigmoid colectomy with descending colostomy 3. Resection of colocutaneous fistula.  SURGEON:  Earnstine Regal, MD, FACS  ANESTHESIA:  General per Everlean Cherry. Delma Post, M.D.  ESTIMATED BLOOD LOSS:  Minimal.  PREPARATION:  ChloraPrep.  COMPLICATIONS:  None.  INDICATIONS:  The patient is a 78 year old male with a history of complicated diverticular disease.  Approximately 1 year ago, he was hospitalized with acute diverticulitis with abscess.  He underwent percutaneous drainage.  Drain was subsequently removed after a course of antibiotic treatment.  The patient decided against surgical resection at that time.  Over the past year, he has experienced intermittent drainage from the site of the percutaneous drain in the lower anterior abdominal wall.  At times, the drainage has become fecalith and he has developed pain.  CT scan in December 2014, demonstrated a colocutaneous fistula. The patient was treated with clear liquid diet and oral antibiotics with some improvement.  He now comes to surgery for resection.  BODY OF REPORT:  Procedure was done in OR #1 at the [facility].  The patient was brought to the operating room and placed in supine position on the operating room table.  Following administration of general anesthesia, the patient was positioned and then prepped and draped in the usual aseptic fashion.  After ascertaining that an adequate level of anesthesia had been  achieved, a midline abdominal incision was made from just below the umbilicus to the pubis.  Dissection was carried through the subcutaneous tissues to the fascia.  Fascia was incised in the midline and the peritoneal cavity was entered cautiously.  The colocutaneous fistula comes to nearly the midline.  It is just to the left of center.  The abdominal wall was markedly inflamed and thickened as is the overlying skin.  There were two fistulous openings on the skin.  The proximal and distal sigmoid colon outside of the area that was adherent to the anterior abdominal wall were soft and pliable without inflammatory changes.  The decision was made to perform sigmoid colectomy with en bloc resection of the colocutaneous fistula.  Therefore, the fascia, rectus muscle, subcutaneous tissue, and skin involved with the fistula are resected using the electrocautery for division and hemostasis.  This allows for complete mobilization of the sigmoid colon.  Peritoneal reflections along the lateral gutter are incised with the electrocautery, allowing for complete mobilization of the sigmoid colon.  The white line was incised along the descending colon.  A point in the distal descending colon was selected and transected with a GIA stapler.  Distally, the bowel was transected at the proximal rectum at the peritoneal reflection using a GIA stapler.  Mesentery was divided with the  LigaSure and larger vessels were clamped with Kelly clamps and ligated with 2-0 silk ties. The entire sigmoid colon with attached colocutaneous fistula is submitted to Pathology for review.  Abdomen was irrigated with warm saline which was evacuated.  Good hemostasis was noted.  The rectal staple line was marked with 2-0 Prolene sutures at either end.  The descending colon was mobilized along its lateral peritoneal attachments.  An elliptical incision was made in the left mid abdominal wall just below the level of the  umbilicus. Dissection was carried down to the rectus fascia.  Fascia was incised with a cruciate incision.  Colon was brought through the abdominal wall and held in place with a Babcock clamp.  The bowel was secured on the peritoneal surface with interrupted 2-0 silk sutures.  All sponges were removed and sponge counts were correct.  The abdomen was again irrigated with fluid which was evacuated.  Midline incision was then closed with interrupted #1 Novafil simple sutures. Subcutaneous tissues were irrigated.  Midline wound was packed with Betadine-soaked Kerlix gauze.  Colostomy was then matured by excising the staple line with the electrocautery.  Colon is approximated to the skin edges circumferentially with interrupted 3-0 Vicryl sutures.  Ostomy appliance was applied.  Sterile dressings were applied.  The patient was awakened from anesthesia and brought to the recovery room.  The patient tolerated the procedure well.   Earnstine Regal, MD, [facility] Office: [phone]    TMG/MEDQ  D:  05/13/2013  T:  05/14/2013  Job:  [id number]

## 2013-05-14 NOTE — Progress Notes (Signed)
Encouraged ambulation this am. Pt verbalized that he did not feel up to walking since he had been up most of the night with different people coming in and out. He verbalized that he did want to walk later today though. Will pass along to the day nurse. Will continue to monitor. Blanchard Kelch, RN

## 2013-05-14 NOTE — Progress Notes (Signed)
1 Day Post-Op  Subjective: Pain under control.  No nausea.  Objective: Vital signs in last 24 hours: Temp:  [97.4 F (36.3 C)-98.9 F (37.2 C)] 98.8 F (37.1 C) (01/10 0525) Pulse Rate:  [50-85] 63 (01/10 0525) Resp:  [8-18] 18 (01/10 0800) BP: (105-172)/(47-82) 155/78 mmHg (01/10 0525) SpO2:  [98 %-100 %] 99 % (01/10 0525) Weight:  [145 lb (65.772 kg)] 145 lb (65.772 kg) (01/09 1430)    Intake/Output from previous day: 01/09 0701 - 01/10 0700 In: 4210.7 [P.O.:240; I.V.:3970.7] Out: 600 [Urine:550; Blood:50] Intake/Output this shift:    PE: General- In NAD Abdomen-soft, open area of wound is clean, LLQ colostomy pink with minimal drainage  Lab Results:   Recent Labs  05/13/13 1235 05/14/13 0425  WBC 12.7* 16.1*  HGB 12.0* 11.9*  HCT 35.7* 35.8*  PLT 293 299   BMET  Recent Labs  05/13/13 1235 05/14/13 0425  NA  --  134*  K  --  4.7  CL  --  95*  CO2  --  26  GLUCOSE  --  212*  BUN  --  28*  CREATININE 1.46* 1.62*  CALCIUM  --  8.5   PT/INR No results found for this basename: LABPROT, INR,  in the last 72 hours Comprehensive Metabolic Panel:    Component Value Date/Time   NA 134* 05/14/2013 0425   K 4.7 05/14/2013 0425   CL 95* 05/14/2013 0425   CO2 26 05/14/2013 0425   BUN 28* 05/14/2013 0425   CREATININE 1.62* 05/14/2013 0425   GLUCOSE 212* 05/14/2013 0425   CALCIUM 8.5 05/14/2013 0425   AST 17 12/31/2011 0340   ALT 8 12/31/2011 0340   ALKPHOS 52 12/31/2011 0340   BILITOT 0.3 12/31/2011 0340   PROT 5.8* 12/31/2011 0340   ALBUMIN 2.5* 12/31/2011 0340     Studies/Results: No results found.  Anti-infectives: Anti-infectives   Start     Dose/Rate Route Frequency Ordered Stop   05/13/13 0649  ertapenem (INVANZ) 1 g in sodium chloride 0.9 % 50 mL IVPB     1 g 100 mL/hr over 30 Minutes Intravenous On call to O.R. 05/13/13 0649 05/13/13 0929      Assessment Principal Problem:   Colocutaneous fistula-s/p sigmoid colectomy and colostomy 05/13/13-stable  overnight.   HTN    LOS: 1 day   Plan: Start clear liquids.  Start saline damp to dry dressing changes bid.   [name] [name] J 05/14/2013

## 2013-05-15 LAB — BASIC METABOLIC PANEL
BUN: 23 mg/dL (ref 6–23)
CHLORIDE: 101 meq/L (ref 96–112)
CO2: 25 meq/L (ref 19–32)
Calcium: 8.5 mg/dL (ref 8.4–10.5)
Creatinine, Ser: 1.25 mg/dL (ref 0.50–1.35)
GFR calc Af Amer: 63 mL/min — ABNORMAL LOW (ref >90)
GFR calc non Af Amer: 55 mL/min — ABNORMAL LOW (ref >90)
GLUCOSE: 161 mg/dL — AB (ref 70–99)
POTASSIUM: 5.6 meq/L — AB (ref 3.7–5.3)
SODIUM: 135 meq/L — AB (ref 137–147)

## 2013-05-15 LAB — CBC
HCT: 32.3 % — ABNORMAL LOW (ref 39.0–52.0)
HEMOGLOBIN: 11 g/dL — AB (ref 13.0–17.0)
MCH: 30 pg (ref 26.0–34.0)
MCHC: 34.1 g/dL (ref 30.0–36.0)
MCV: 88 fL (ref 78.0–100.0)
Platelets: 279 10*3/uL (ref 150–400)
RBC: 3.67 MIL/uL — ABNORMAL LOW (ref 4.22–5.81)
RDW: 13.6 % (ref 11.5–15.5)
WBC: 27.3 10*3/uL — AB (ref 4.0–10.5)

## 2013-05-15 NOTE — Progress Notes (Signed)
No shadowing noted to dsg since change done earlier, pt able to void since foley dc'd

## 2013-05-15 NOTE — Progress Notes (Signed)
Patient ID: [name] [name], male   DOB: 12/16/1935, 78 y.o.   MRN: [id number] [facility] Surgery Progress Note:   2 Days Post-Op  Subjective: Mental status is clear.  No complaints.  Taking clears ok Objective: Vital signs in last 24 hours: Temp:  [98.1 F (36.7 C)-98.7 F (37.1 C)] 98.2 F (36.8 C) (01/11 0624) Pulse Rate:  [60-70] 60 (01/11 0624) Resp:  [13-18] 18 (01/11 0800) BP: (148-163)/(64-78) 151/78 mmHg (01/11 0624) SpO2:  [99 %-100 %] 100 % (01/11 0800)  Intake/Output from previous day: 01/10 0701 - 01/11 0700 In: 2880 [P.O.:480; I.V.:2400] Out: 3000 [Urine:3000] Intake/Output this shift:    Physical Exam: Work of breathing is not labored.  Had bleeding from his wound.  Examined.  Old clot in wound.  Continue saline pressure packing.    Lab Results:  Results for orders placed during the hospital encounter of 05/13/13 (from the past 48 hour(s))  CBC     Status: Abnormal   Collection Time    05/13/13 12:35 PM      Result Value Range   WBC 12.7 (*) 4.0 - 10.5 K/uL   RBC 3.96 (*) 4.22 - 5.81 MIL/uL   Hemoglobin 12.0 (*) 13.0 - 17.0 g/dL   HCT 35.7 (*) 39.0 - 52.0 %   MCV 90.2  78.0 - 100.0 fL   MCH 30.3  26.0 - 34.0 pg   MCHC 33.6  30.0 - 36.0 g/dL   RDW 13.2  11.5 - 15.5 %   Platelets 293  150 - 400 K/uL  CREATININE, SERUM     Status: Abnormal   Collection Time    05/13/13 12:35 PM      Result Value Range   Creatinine, Ser 1.46 (*) 0.50 - 1.35 mg/dL   GFR calc non Af Amer 45 (*) >90 mL/min   GFR calc Af Amer 52 (*) >90 mL/min   Comment: (NOTE)     The eGFR has been calculated using the CKD EPI equation.     This calculation has not been validated in all clinical situations.     eGFR's persistently <90 mL/min signify possible Chronic Kidney     Disease.  CBC     Status: Abnormal   Collection Time    05/14/13  4:25 AM      Result Value Range   WBC 16.1 (*) 4.0 - 10.5 K/uL   RBC 4.05 (*) 4.22 - 5.81 MIL/uL   Hemoglobin 11.9 (*) 13.0 - 17.0 g/dL   HCT  35.8 (*) 39.0 - 52.0 %   MCV 88.4  78.0 - 100.0 fL   MCH 29.4  26.0 - 34.0 pg   MCHC 33.2  30.0 - 36.0 g/dL   RDW 13.2  11.5 - 15.5 %   Platelets 299  150 - 400 K/uL  BASIC METABOLIC PANEL     Status: Abnormal   Collection Time    05/14/13  4:25 AM      Result Value Range   Sodium 134 (*) 137 - 147 mEq/L   Potassium 4.7  3.7 - 5.3 mEq/L   Chloride 95 (*) 96 - 112 mEq/L   CO2 26  19 - 32 mEq/L   Glucose, Bld 212 (*) 70 - 99 mg/dL   BUN 28 (*) 6 - 23 mg/dL   Creatinine, Ser 1.62 (*) 0.50 - 1.35 mg/dL   Calcium 8.5  8.4 - 10.5 mg/dL   GFR calc non Af Amer 40 (*) >90 mL/min   GFR calc Af  Amer 46 (*) >90 mL/min   Comment: (NOTE)     The eGFR has been calculated using the CKD EPI equation.     This calculation has not been validated in all clinical situations.     eGFR's persistently <90 mL/min signify possible Chronic Kidney     Disease.  CBC     Status: Abnormal   Collection Time    05/15/13  5:02 AM      Result Value Range   WBC 27.3 (*) 4.0 - 10.5 K/uL   RBC 3.67 (*) 4.22 - 5.81 MIL/uL   Hemoglobin 11.0 (*) 13.0 - 17.0 g/dL   HCT 32.3 (*) 39.0 - 52.0 %   MCV 88.0  78.0 - 100.0 fL   MCH 30.0  26.0 - 34.0 pg   MCHC 34.1  30.0 - 36.0 g/dL   RDW 13.6  11.5 - 15.5 %   Platelets 279  150 - 400 K/uL  BASIC METABOLIC PANEL     Status: Abnormal   Collection Time    05/15/13  5:02 AM      Result Value Range   Sodium 135 (*) 137 - 147 mEq/L   Potassium 5.6 (*) 3.7 - 5.3 mEq/L   Chloride 101  96 - 112 mEq/L   CO2 25  19 - 32 mEq/L   Glucose, Bld 161 (*) 70 - 99 mg/dL   BUN 23  6 - 23 mg/dL   Creatinine, Ser 1.25  0.50 - 1.35 mg/dL   Calcium 8.5  8.4 - 10.5 mg/dL   GFR calc non Af Amer 55 (*) >90 mL/min   GFR calc Af Amer 63 (*) >90 mL/min   Comment: (NOTE)     The eGFR has been calculated using the CKD EPI equation.     This calculation has not been validated in all clinical situations.     eGFR's persistently <90 mL/min signify possible Chronic Kidney     Disease.     Radiology/Results: No results found.  Anti-infectives: Anti-infectives   Start     Dose/Rate Route Frequency Ordered Stop   05/13/13 0649  ertapenem (INVANZ) 1 g in sodium chloride 0.9 % 50 mL IVPB     1 g 100 mL/hr over 30 Minutes Intravenous On call to O.R. 05/13/13 0649 05/13/13 0929      Assessment/Plan: Problem List: Patient Active Problem List   Diagnosis Date Noted  . Diverticular disease 05/13/2013  . Colocutaneous fistula 05/09/2013  . Renal insufficiency 12/31/2011  . Dyslipidemia 12/30/2011  . Carcinoma of prostate 12/30/2011  . Diverticulitis of sigmoid colon 12/29/2011  . Pacemaker-St.Jude 12/03/2011  . Sick sinus syndrome 11/28/2011  . Syncope and collapse 11/20/2011  . Atrial flutter with controlled response 06/16/2011  . Essential hypertension, benign 06/16/2011    WBC up today with obvious source.  Taking clears ok with scant ostomy output.  Unclear for elevated wBC.  Will recheck.   Will manage incisional bleeding with holding Lovenox . 2 Days Post-Op    LOS: 2 days   Matt B. Hassell Done, MD, [facility] [phone] beeper [phone]  05/15/2013 11:19 AM

## 2013-05-15 NOTE — Progress Notes (Signed)
Abdominal dsg changed, lots of clotting noted, no obvious bleeding noted.  Dr Hassell Done assessed site and order to hold lovenox given.  Incision repacked wet to dry.  Will continue to monitor.

## 2013-05-16 ENCOUNTER — Encounter (HOSPITAL_COMMUNITY): Payer: Self-pay | Admitting: Surgery

## 2013-05-16 LAB — CBC WITH DIFFERENTIAL/PLATELET
Basophils Absolute: 0 10*3/uL (ref 0.0–0.1)
Basophils Relative: 0 % (ref 0–1)
Eosinophils Absolute: 0 10*3/uL (ref 0.0–0.7)
Eosinophils Relative: 0 % (ref 0–5)
HEMATOCRIT: 29.9 % — AB (ref 39.0–52.0)
HEMOGLOBIN: 10 g/dL — AB (ref 13.0–17.0)
LYMPHS ABS: 2.5 10*3/uL (ref 0.7–4.0)
Lymphocytes Relative: 16 % (ref 12–46)
MCH: 30 pg (ref 26.0–34.0)
MCHC: 33.4 g/dL (ref 30.0–36.0)
MCV: 89.8 fL (ref 78.0–100.0)
MONOS PCT: 6 % (ref 3–12)
Monocytes Absolute: 1 10*3/uL (ref 0.1–1.0)
Neutro Abs: 12.2 10*3/uL — ABNORMAL HIGH (ref 1.7–7.7)
Neutrophils Relative %: 78 % — ABNORMAL HIGH (ref 43–77)
Platelets: 223 10*3/uL (ref 150–400)
RBC: 3.33 MIL/uL — AB (ref 4.22–5.81)
RDW: 13.7 % (ref 11.5–15.5)
WBC: 15.7 10*3/uL — ABNORMAL HIGH (ref 4.0–10.5)

## 2013-05-16 NOTE — Progress Notes (Signed)
In and out cath unsuccessful.  Met resistance.

## 2013-05-16 NOTE — Progress Notes (Signed)
Patient ID: Khamarion Leak, male   DOB: Sep 22, 1935, 78 y.o.   MRN: [id number]  [facility] - Progress Note  POD# 3  Subjective: Patient without complaint.  Voiding small amounts frequently.  Taking some clear liquid diet.  Not ambulating much.  Objective: Vital signs in last 24 hours: Temp:  [97.9 F (36.6 C)-98.6 F (37 C)] 97.9 F (36.6 C) (01/12 0617) Pulse Rate:  [60-61] 61 (01/12 0617) Resp:  [12-18] 18 (01/12 0617) BP: (107-145)/(54-80) 145/80 mmHg (01/12 0617) SpO2:  [98 %-100 %] 100 % (01/12 0617) Last BM Date: 05/15/13  Intake/Output from previous day: 01/11 0701 - 01/12 0700 In: 3240 [P.O.:840; I.V.:2400] Out: 2875 [Urine:2725; Stool:150]  Exam: HEENT - clear, not icteric Neck - soft Chest - clear bilaterally Cor - RRR, no murmur Abd - soft, mild distension; no further bleeding from open wound; stoma viable with liquid stool in bag Ext - no significant edema Neuro - grossly intact, no focal deficits  Lab Results:   Recent Labs  05/15/13 0502 05/16/13 0530  WBC 27.3* 15.7*  HGB 11.0* 10.0*  HCT 32.3* 29.9*  PLT 279 223     Recent Labs  05/14/13 0425 05/15/13 0502  NA 134* 135*  K 4.7 5.6*  CL 95* 101  CO2 26 25  GLUCOSE 212* 161*  BUN 28* 23  CREATININE 1.62* 1.25  CALCIUM 8.5 8.5    Studies/Results: No results found.  Assessment / Plan: 1.  Status post sigmoid colectomy with resection colocutaneous fistula  Continue clear liquid diet  OOB, ambulate in halls  BID wound care  Stop Lovenox secondary to bleeding from wound 2.  Urinary retention  Will I&O cath  [place], MD, [facility] Office: [phone]  05/16/2013

## 2013-05-16 NOTE — Consult Note (Signed)
WOC ostomy consult note Stoma type/location: LLQ Colostomy Stomal assessment/size: Not measured today Peristomal assessment: Not seen today Treatment options for stomal/peristomal skin: None indicated Output Thin dark brown effluent with flatus Ostomy pouching: 1pc.  Patient is wearing a 1-pc drainble pouch with Karaya backing.  I have ordered 2-piece supplies and will have a teaching session and change his pouch in the am. Education provided: Patient is provided with a teaching booklet and voices understanding about the [place] Nurse role in ostomy care and management. [place] nursing team will follow,and will remain available to this patient, the nursing and medical team.  Please re-consult if needed in between visits. Thanks, Maudie Flakes, MSN, RN, [place], [place], [place] [phone])

## 2013-05-17 NOTE — Progress Notes (Signed)
Patient ID: [name] [name], male   DOB: Oct 02, 1935, 78 y.o.   MRN: [id number]  [facility] - Progress Note  POD# 4  Subjective: Patient up in chair.  Pleasant.  No complaints.  Tolerating clear liquids - no nausea or emesis.  Voiding regularly.  Objective: Vital signs in last 24 hours: Temp:  [98.1 F (36.7 C)-98.3 F (36.8 C)] 98.1 F (36.7 C) (01/13 0508) Pulse Rate:  [60-74] 74 (01/13 1037) Resp:  [10-18] 14 (01/13 0932) BP: (101-143)/(57-74) 143/66 mmHg (01/13 1037) SpO2:  [99 %-100 %] 100 % (01/13 0932) Last BM Date: 05/15/13  Intake/Output from previous day: 01/12 0701 - 01/13 0700 In: 2398.3 [P.O.:1080; I.V.:1318.3] Out: 3900 [Urine:3700; Stool:200]  Exam: HEENT - clear, not icteric Neck - soft Chest - clear bilaterally Cor - RRR, no murmur Abd - soft, mild distension; ostomy bag with large gas; dressing dry and intact Ext - no significant edema Neuro - grossly intact, no focal deficits  Lab Results:   Recent Labs  05/15/13 0502 05/16/13 0530  WBC 27.3* 15.7*  HGB 11.0* 10.0*  HCT 32.3* 29.9*  PLT 279 223     Recent Labs  05/15/13 0502  NA 135*  K 5.6*  CL 101  CO2 25  GLUCOSE 161*  BUN 23  CREATININE 1.25  CALCIUM 8.5    Studies/Results: No results found.  Assessment / Plan: 1.  Status post sigmoid colectomy with ostomy and resection colocutaneous fistula  Advance to full liquid diet  Continue PCA for one more day  OOB, ambulate in halls  Appreciate [place] instruction  Will order binder per patient request  Earnstine Regal, MD, [facility] Office: [phone]  05/17/2013

## 2013-05-17 NOTE — Consult Note (Signed)
WOC ostomy follow up Stoma type/location: LLQ Colostomy Stomal assessment/size: 1 and 3/8 inches round, budded, pink, moist. Peristomal assessment: intact, clear Treatment options for stomal/peristomal skin: None indicated Output soft brown stool Ostomy pouching: 2pc. 2 and 1/4 inch pouching system. Education provided: Patient education booklet content reviewed:  A&P, pouch characteristics, stomal characteristics, diet and lifestyle, pouch change regimen of twice weekly, bathing (may shower when physician agrees).  Patient viewed pouch change and was able to give return demonstration of openeing and closing the tail closure mechanism (Lock and Roll).  Patient understands but will need outpatient support and teaching reinforcement.  Recommend HHRN for those services.  If you agree, please order. Enrolled patient in [place] Start Discharge program: Yes [place] nursing team will follow, and will remain available to this patient, the nursing and medical team.  Please re-consult if needed between visits. Thanks, Maudie Flakes, MSN, RN, [place], [place], [place] [phone])

## 2013-05-18 MED ORDER — OXYCODONE-ACETAMINOPHEN 5-325 MG PO TABS
1.0000 | ORAL_TABLET | ORAL | Status: DC | PRN
  Administered 2013-05-18 – 2013-05-19 (×3): 2 via ORAL
  Filled 2013-05-18 (×3): qty 2

## 2013-05-18 MED ORDER — TAMSULOSIN HCL 0.4 MG PO CAPS
0.4000 mg | ORAL_CAPSULE | Freq: Every morning | ORAL | Status: DC
  Administered 2013-05-18 – 2013-05-19 (×2): 0.4 mg via ORAL
  Filled 2013-05-18 (×2): qty 1

## 2013-05-18 NOTE — Progress Notes (Signed)
Patient ID: [name] [name], male   DOB: Jul 16, 1935, 78 y.o.   MRN: [id number]  [facility] - Progress Note  POD# 5  Subjective: Patient in bed.  No complaints.  Advanced to regular diet this morning.  No nausea or emesis.  Learning stoma and wound care.  Objective: Vital signs in last 24 hours: Temp:  [98.8 F (37.1 C)-98.9 F (37.2 C)] 98.9 F (37.2 C) (01/14 1322) Pulse Rate:  [59-70] 59 (01/14 1322) Resp:  [10-18] 16 (01/14 1322) BP: (96-111)/(42-54) 102/46 mmHg (01/14 1322) SpO2:  [97 %-100 %] 97 % (01/14 1322) Last BM Date: 05/15/13  Intake/Output from previous day: 01/13 0701 - 01/14 0700 In: 1955 [P.O.:800; I.V.:1155] Out: 12 [Urine:2800; Stool:100]  Exam: HEENT - clear, not icteric Neck - soft Chest - clear bilaterally Cor - RRR, no murmur Abd - soft, mild distension; dressing dry and intact; stoma viable with liquid stool in bag Ext - no significant edema Neuro - grossly intact, no focal deficits  Lab Results:   Recent Labs  05/16/13 0530  WBC 15.7*  HGB 10.0*  HCT 29.9*  PLT 223    No results found for this basename: NA, K, CL, CO2, GLUCOSE, BUN, CREATININE, CALCIUM,  in the last 72 hours  Studies/Results: No results found.  Assessment / Plan: 1.  Status post Hartmann's resection for colocutaneous fistula  Regular diet today  Oral pain Rx  Arrange HHN for home wound care and ostomy care  Anticipate discharge home tomorrow if diet tolerated  Earnstine Regal, MD, [facility] Office: [phone]  05/18/2013

## 2013-05-18 NOTE — Consult Note (Addendum)
WOC ostomy follow up Stoma type/location: LLQ Colostomy Stomal assessment/size: 1 and 3/8 inches round, budded, pink, functioning Peristomal assessment: Not seen today as pouch applied yesterday is intact Treatment options for stomal/peristomal skin: None indicated Output Soft brown stool Ostomy pouching: 2pc.  Education provided: Patient gave return demonstration today of pouch emptying in toilet after much encouragement.  Initially refused stating he would "rather sleep". Staff instructed to give stand-by assistance only as patient must be independent with this skill prior to discharge. Patient elects to instill a small amount of water into pouch after emptying.  As this is a patient preference and not required, I have allowed him this flexibility as it demonstrates ownership of stoma management. Enrolled patient in [place] Discharge program: Yes Supplies provided for 1.5 weeks and [facility] will also bring supplies for stoma care to home.  Ready for discharge to the care of [facility] with followup by MD. [place] nursing team will follow, and will remain available to this patient, the nursing and surgical teams.  Please re-consult if needed. Thanks, Maudie Flakes, MSN, RN, [place], [place], [place] [phone])

## 2013-05-19 MED ORDER — OXYCODONE-ACETAMINOPHEN 5-325 MG PO TABS: 1.0000 | ORAL_TABLET | ORAL | Status: DC | PRN

## 2013-05-19 NOTE — Progress Notes (Signed)
D/C instructions reviewed w/ pt and dtr.  Both verbalized understanding, all questions answered. Pt d/c in w/c in stable condition by NT to dtr's car. Pt in possession of d/c instructions, script for Percocet, abd binder, ostomy and dsg supplies, and all personal belongings.

## 2013-05-19 NOTE — Discharge Summary (Signed)
Physician Discharge Summary [facility]  Patient ID: [name] [name] MRN: [id number] DOB/AGE: 76/21/1937 76 y.o.  Admit date: 05/13/2013 Discharge date: 05/19/2013  Admission Diagnoses:  Diverticular disease with colocutaneous fistula  Discharge Diagnoses:  Principal Problem:   Colocutaneous fistula Active Problems:   Diverticulitis of sigmoid colon   Diverticular disease   Discharged Condition: good  Hospital Course: patient underwent ex lap with sigmoid colectomy, resection of colocutaneous fistula, and descending colostomy.  Post op course stable.  Monitored on telemetry with pacemaker without incident.  Wound care started with twice daily dressing changes.  Stoma care instruction initiated by [facility] nurse.  Diet advanced to regular.  Prepared for discharge on POD#6.  Consults: None  Significant Diagnostic Studies: none  Treatments: surgery: sigmoid colectomy, resection colocutaneous fistula, descending colostomy  Discharge Exam: Blood pressure 118/51, pulse 70, temperature 98.9 F (37.2 C), temperature source Oral, resp. rate 16, height 5\' 7"  (1.702 m), weight 145 lb (65.772 kg), SpO2 99.00%. HEENT - clear Neck - soft Chest - clear bilaterally Cor - RRR Abd - soft without distension; wound clean and intact, early granulation present, no bleeding; stoma viable with soft stool  Disposition: Home with family  Discharge Orders   Future Appointments Provider Department Dept Phone   05/27/2013 4:05 PM Earnstine Regal, MD [facility], [place] [phone]   Future Orders Complete By Expires   Change dressing (specify)  As directed    Comments:     Dressing change: 2 times per day using normal saline moistened gauze wet to dry.  Shower with packing removed once daily.   Diet - low sodium heart healthy  As directed    Discharge instructions  As directed    Comments:     [facility]  OPEN ABDOMINAL SURGERY: POST OP INSTRUCTIONS  Always  review your discharge instruction sheet given to you by the facility where your surgery was performed.  A prescription for pain medication may be given to you upon discharge.  Take your pain medication as prescribed.  If narcotic pain medicine is not needed, then you may take acetaminophen (Tylenol) or ibuprofen (Advil) as needed. Take your usually prescribed medications unless otherwise directed. If you need a refill on your pain medication, please contact your pharmacy. They will contact our office to request authorization.  Prescriptions will not be filled after 5 pm or on weekends. You should follow a light diet the first few days after arrival home, such as soup and crackers, unless your doctor has advised otherwise. A high-fiber, low fat diet can be resumed as tolerated.  Be sure to include plenty of fluids daily.  Most patients will experience some swelling and bruising in the area of the incision. Ice packs will help. Swelling and bruising can take several days to resolve. It is common to experience some constipation if taking pain medication after surgery.  Increasing fluid intake and taking a stool softener will usually help or prevent this problem from occurring.  A mild laxative (Milk of Magnesia or Miralax) should be taken according to package directions if there are no bowel movements after 48 hours.  You may have steri-strips (small skin tapes) in place directly over the incision.  These strips should be left on the skin for 7-10 days.  If your surgeon used skin glue on the incision, you may shower in 24 hours.  The glue will flake off over the next 2-3 weeks.  Any sutures or staples will be removed  at the office during your follow-up visit. You may find that a light gauze bandage over your incision may keep your staples from being rubbed or pulled. You may shower and replace the bandage daily. ACTIVITIES:  You may resume regular (light) daily activities beginning the next day-such as daily  self-care, walking, climbing stairs-gradually increasing activities as tolerated.  You may have sexual intercourse when it is comfortable.  Refrain from any heavy lifting or straining until approved by your doctor.  You may drive when you no longer are taking prescription pain medication, you can comfortably wear a seatbelt, and you can safely maneuver your car and apply brakes. You should see your doctor in the office for a follow-up appointment approximately two weeks after your surgery.  Make sure that you call for this appointment within a day or two after you arrive home to insure a convenient appointment time.  WHEN TO CALL YOUR DOCTOR: Fever greater than 101.0 Inability to urinate Persistent nausea and/or vomiting Extreme swelling or bruising Continued bleeding from incision Increased pain, redness, or drainage from the incision Difficulty swallowing or breathing Muscle cramping or spasms Numbness or tingling in hands or around lips  IF YOU HAVE DISABILITY OR FAMILY LEAVE FORMS, YOU MUST BRING THEM TO THE OFFICE FOR PROCESSING.  PLEASE DO NOT GIVE THEM TO YOUR DOCTOR.  The clinic staff is available to answer your questions during regular business hours.  Please don't hesitate to call and ask to speak to one of the nurses if you have concerns.  [facility] [place] Office: [phone]  For further questions, please visit [email or web address]   Increase activity slowly  As directed        Medication List    STOP taking these medications       amoxicillin-clavulanate 875-125 MG per tablet  Commonly known as:  AUGMENTIN      TAKE these medications       aspirin 81 MG chewable tablet  Chew 81 mg by mouth daily.     olmesartan-hydrochlorothiazide 40-12.5 MG per tablet  Commonly known as:  BENICAR HCT  Take 1 tablet by mouth at bedtime.     oxyCODONE-acetaminophen 5-325 MG per tablet  Commonly known as:  PERCOCET/ROXICET  Take 1-2 tablets by mouth  every 4 (four) hours as needed for moderate pain.     simvastatin 20 MG tablet  Commonly known as:  ZOCOR  Take 20 mg by mouth at bedtime.     tamsulosin 0.4 MG Caps capsule  Commonly known as:  FLOMAX  Take 0.4 mg by mouth 2 (two) times daily.           Follow-up Information   Follow up with Earnstine Regal, MD. Schedule an appointment as soon as possible for a visit in 10 days.   Specialty:  General Surgery   Contact information:   [address] [place] [address] [phone]       Earnstine Regal, MD, [facility] Office: [phone]   Signed: Earnstine Regal 05/19/2013, 9:31 AM

## 2013-05-19 NOTE — Progress Notes (Signed)
Pt's ostomy pouch is currently empty of flatus and has scant amt of pasty brown stool.  Pt verbalizes to this Probation officer and to Dr Harlow Asa at bedside that he has emptied pouch independently twice in last 24 hours. Verbalizes that he is comfortable with emptying of pouch.  After dsg changed by Dr Harlow Asa, this writer suggested that pt could demonstrate emptying pouch at this time.  Pt stated " There's nothing in it.  I'm not going to bother with it if there's nothing in it."  Again states that he feels comfortable with emptying of pouch.

## 2013-05-19 NOTE — Progress Notes (Signed)
Pt agreed with [facility] for [place]. Referral called into [facility].

## 2013-05-19 NOTE — Progress Notes (Signed)
Pt refused to give return demonstration of pouch emptying. Staff will continue to encourage pt to promote independence. Roslyn Smiling, RN

## 2013-05-20 DIAGNOSIS — Z4801 Encounter for change or removal of surgical wound dressing: Secondary | ICD-10-CM | POA: Diagnosis not present

## 2013-05-20 DIAGNOSIS — Z48815 Encounter for surgical aftercare following surgery on the digestive system: Secondary | ICD-10-CM | POA: Diagnosis not present

## 2013-05-20 DIAGNOSIS — I10 Essential (primary) hypertension: Secondary | ICD-10-CM | POA: Diagnosis not present

## 2013-05-20 DIAGNOSIS — Z433 Encounter for attention to colostomy: Secondary | ICD-10-CM | POA: Diagnosis not present

## 2013-05-21 DIAGNOSIS — Z433 Encounter for attention to colostomy: Secondary | ICD-10-CM | POA: Diagnosis not present

## 2013-05-21 DIAGNOSIS — Z4801 Encounter for change or removal of surgical wound dressing: Secondary | ICD-10-CM | POA: Diagnosis not present

## 2013-05-21 DIAGNOSIS — I10 Essential (primary) hypertension: Secondary | ICD-10-CM | POA: Diagnosis not present

## 2013-05-21 DIAGNOSIS — Z48815 Encounter for surgical aftercare following surgery on the digestive system: Secondary | ICD-10-CM | POA: Diagnosis not present

## 2013-05-22 DIAGNOSIS — Z48815 Encounter for surgical aftercare following surgery on the digestive system: Secondary | ICD-10-CM | POA: Diagnosis not present

## 2013-05-22 DIAGNOSIS — Z433 Encounter for attention to colostomy: Secondary | ICD-10-CM | POA: Diagnosis not present

## 2013-05-22 DIAGNOSIS — I10 Essential (primary) hypertension: Secondary | ICD-10-CM | POA: Diagnosis not present

## 2013-05-22 DIAGNOSIS — Z4801 Encounter for change or removal of surgical wound dressing: Secondary | ICD-10-CM | POA: Diagnosis not present

## 2013-05-23 DIAGNOSIS — Z4801 Encounter for change or removal of surgical wound dressing: Secondary | ICD-10-CM | POA: Diagnosis not present

## 2013-05-23 DIAGNOSIS — Z48815 Encounter for surgical aftercare following surgery on the digestive system: Secondary | ICD-10-CM | POA: Diagnosis not present

## 2013-05-23 DIAGNOSIS — Z433 Encounter for attention to colostomy: Secondary | ICD-10-CM | POA: Diagnosis not present

## 2013-05-23 DIAGNOSIS — I10 Essential (primary) hypertension: Secondary | ICD-10-CM | POA: Diagnosis not present

## 2013-05-25 ENCOUNTER — Telehealth (INDEPENDENT_AMBULATORY_CARE_PROVIDER_SITE_OTHER): Payer: Self-pay

## 2013-05-25 DIAGNOSIS — Z48815 Encounter for surgical aftercare following surgery on the digestive system: Secondary | ICD-10-CM | POA: Diagnosis not present

## 2013-05-25 DIAGNOSIS — Z4801 Encounter for change or removal of surgical wound dressing: Secondary | ICD-10-CM | POA: Diagnosis not present

## 2013-05-25 DIAGNOSIS — Z433 Encounter for attention to colostomy: Secondary | ICD-10-CM | POA: Diagnosis not present

## 2013-05-25 DIAGNOSIS — I10 Essential (primary) hypertension: Secondary | ICD-10-CM | POA: Diagnosis not present

## 2013-05-25 NOTE — Telephone Encounter (Signed)
Sophia at [facility] called wanting to know if pt could be changed to a wound vac order instead of wet to dry. I advised her that Dr Harlow Asa will be sent the request but they should continue orders per Dr Harlow Asa until he reviews order. She can be reached at [phone].

## 2013-05-26 DIAGNOSIS — Z4801 Encounter for change or removal of surgical wound dressing: Secondary | ICD-10-CM | POA: Diagnosis not present

## 2013-05-26 DIAGNOSIS — Z433 Encounter for attention to colostomy: Secondary | ICD-10-CM | POA: Diagnosis not present

## 2013-05-26 DIAGNOSIS — Z48815 Encounter for surgical aftercare following surgery on the digestive system: Secondary | ICD-10-CM | POA: Diagnosis not present

## 2013-05-26 DIAGNOSIS — I10 Essential (primary) hypertension: Secondary | ICD-10-CM | POA: Diagnosis not present

## 2013-05-26 NOTE — Telephone Encounter (Signed)
Wound VAC would be fine if approved for this patient.  Earnstine Regal, MD, [facility] Office: [phone]

## 2013-05-26 NOTE — Telephone Encounter (Signed)
LMOM with Sophia per her request that wound vac will be fine as long as [place] can and will get approval for the charges to be covered.

## 2013-05-27 ENCOUNTER — Encounter (INDEPENDENT_AMBULATORY_CARE_PROVIDER_SITE_OTHER): Payer: Self-pay | Admitting: Surgery

## 2013-05-27 ENCOUNTER — Ambulatory Visit (INDEPENDENT_AMBULATORY_CARE_PROVIDER_SITE_OTHER): Payer: Medicare Other | Admitting: Surgery

## 2013-05-27 VITALS — BP 143/82 | HR 70 | Temp 97.7°F | Resp 14 | Ht 67.0 in | Wt 139.6 lb

## 2013-05-27 DIAGNOSIS — K632 Fistula of intestine: Secondary | ICD-10-CM

## 2013-05-27 DIAGNOSIS — K5732 Diverticulitis of large intestine without perforation or abscess without bleeding: Secondary | ICD-10-CM

## 2013-05-27 MED ORDER — OXYCODONE-ACETAMINOPHEN 5-325 MG PO TABS: 1.0000 | ORAL_TABLET | ORAL | Status: DC | PRN

## 2013-05-27 NOTE — Patient Instructions (Signed)
Please shower daily. Remove all dressings and packing from the wound prior to shower. Allowed shower to irrigate the open wound. Applied clean dressing following shower.  Please add 2 tablespoons of Clorox to each liter of normal saline used for dressing changes.  Earnstine Regal, MD, [facility] Office: [phone]

## 2013-05-27 NOTE — Progress Notes (Signed)
General Surgery [facility]  Chief Complaint  Patient presents with  . Routine Post Op    Hartmann's resection for colocutaneous fistula 05/13/2013    HISTORY: Patient is a 78 year old male who underwent a Hartman's resection for colocutaneous fistula on 05/13/2013. Midline abdominal wound is healing by secondary intention. Home health nursing is assisting with wound care.  EXAM: Midline wound is undressed. There is early granulation tissue throughout the wound. There is no sign of dehiscence. There is no sign of infection. Colostomy has healed nicely and appears to be functioning normally.  IMPRESSION: Status post Hartmann's resection for colocutaneous fistula  PLAN: Patient will continue daily wet-to-dry dressing changes. He may begin showers to the open wound. I will ask his nurse to at a small amount of Clorox to the saline used to moisten his dressings.  Patient will return for wound check in 2-3 weeks.  Earnstine Regal, MD, [facility]   Visit Diagnoses: 1. Colocutaneous fistula   2. Diverticulitis of sigmoid colon

## 2013-05-30 ENCOUNTER — Telehealth (INDEPENDENT_AMBULATORY_CARE_PROVIDER_SITE_OTHER): Payer: Self-pay

## 2013-05-30 DIAGNOSIS — Z433 Encounter for attention to colostomy: Secondary | ICD-10-CM | POA: Diagnosis not present

## 2013-05-30 DIAGNOSIS — I10 Essential (primary) hypertension: Secondary | ICD-10-CM | POA: Diagnosis not present

## 2013-05-30 DIAGNOSIS — Z48815 Encounter for surgical aftercare following surgery on the digestive system: Secondary | ICD-10-CM | POA: Diagnosis not present

## 2013-05-30 DIAGNOSIS — Z4801 Encounter for change or removal of surgical wound dressing: Secondary | ICD-10-CM | POA: Diagnosis not present

## 2013-05-30 NOTE — Telephone Encounter (Signed)
Tiffany at [facility] called requesting order to change wound care to calcium alginate instead of Dakins solution. Per Dr Harlow Asa it will be fine if they want to change to calcium alginate. Tiffany advised.

## 2013-05-31 NOTE — Telephone Encounter (Signed)
Script already written for patient and send to care [place] on 05/25/13

## 2013-06-01 DIAGNOSIS — Z433 Encounter for attention to colostomy: Secondary | ICD-10-CM | POA: Diagnosis not present

## 2013-06-01 DIAGNOSIS — Z48815 Encounter for surgical aftercare following surgery on the digestive system: Secondary | ICD-10-CM | POA: Diagnosis not present

## 2013-06-01 DIAGNOSIS — I10 Essential (primary) hypertension: Secondary | ICD-10-CM | POA: Diagnosis not present

## 2013-06-01 DIAGNOSIS — Z4801 Encounter for change or removal of surgical wound dressing: Secondary | ICD-10-CM | POA: Diagnosis not present

## 2013-06-03 ENCOUNTER — Encounter (INDEPENDENT_AMBULATORY_CARE_PROVIDER_SITE_OTHER): Payer: Self-pay

## 2013-06-03 DIAGNOSIS — Z433 Encounter for attention to colostomy: Secondary | ICD-10-CM | POA: Diagnosis not present

## 2013-06-03 DIAGNOSIS — I10 Essential (primary) hypertension: Secondary | ICD-10-CM | POA: Diagnosis not present

## 2013-06-03 DIAGNOSIS — Z4801 Encounter for change or removal of surgical wound dressing: Secondary | ICD-10-CM | POA: Diagnosis not present

## 2013-06-03 DIAGNOSIS — Z48815 Encounter for surgical aftercare following surgery on the digestive system: Secondary | ICD-10-CM | POA: Diagnosis not present

## 2013-06-06 DIAGNOSIS — Z4801 Encounter for change or removal of surgical wound dressing: Secondary | ICD-10-CM | POA: Diagnosis not present

## 2013-06-06 DIAGNOSIS — Z433 Encounter for attention to colostomy: Secondary | ICD-10-CM | POA: Diagnosis not present

## 2013-06-06 DIAGNOSIS — Z48815 Encounter for surgical aftercare following surgery on the digestive system: Secondary | ICD-10-CM | POA: Diagnosis not present

## 2013-06-06 DIAGNOSIS — I10 Essential (primary) hypertension: Secondary | ICD-10-CM | POA: Diagnosis not present

## 2013-06-08 ENCOUNTER — Ambulatory Visit (INDEPENDENT_AMBULATORY_CARE_PROVIDER_SITE_OTHER): Payer: Medicare Other | Admitting: Surgery

## 2013-06-08 ENCOUNTER — Other Ambulatory Visit (INDEPENDENT_AMBULATORY_CARE_PROVIDER_SITE_OTHER): Payer: Self-pay

## 2013-06-08 ENCOUNTER — Encounter (INDEPENDENT_AMBULATORY_CARE_PROVIDER_SITE_OTHER): Payer: Self-pay | Admitting: Surgery

## 2013-06-08 VITALS — BP 136/70 | HR 68 | Temp 97.9°F | Resp 14 | Ht 67.0 in | Wt 137.4 lb

## 2013-06-08 DIAGNOSIS — K632 Fistula of intestine: Secondary | ICD-10-CM

## 2013-06-08 DIAGNOSIS — K5732 Diverticulitis of large intestine without perforation or abscess without bleeding: Secondary | ICD-10-CM

## 2013-06-08 MED ORDER — OXYCODONE-ACETAMINOPHEN 5-325 MG PO TABS: 1.0000 | ORAL_TABLET | ORAL | Status: DC | PRN

## 2013-06-08 NOTE — Progress Notes (Signed)
General Surgery [facility]  Chief Complaint  Patient presents with  . Routine Post Op    Hartmann's resection for colocutaneous fistula 05/13/2013    HISTORY: Patient returns for wound check having undergone Hartman's resection for colocutaneous fistula 1 month ago. There are doing dressing changes to his midline abdominal incision with the assistance of a home health nurse. Patient continues to complain of incisional pain and requested a refill of his narcotic analgesic.  EXAM: Abdominal wound is healing nicely with good granulation tissue throughout. Minimal drainage. No sign of infection. No sign of herniation. Dressing is replaced.  IMPRESSION: Status post Hartman's resection for colocutaneous fistula, wound healing by secondary intention  PLAN: Patient will continue dressing changes with the assistance of home health nursing. He will return in one month for wound check. At that time we will initiate preparation for colostomy closure.  Earnstine Regal, MD, [facility]   Visit Diagnoses: 1. Colocutaneous fistula   2. Diverticulitis of sigmoid colon

## 2013-06-08 NOTE — Patient Instructions (Signed)
Continue wound care as instructed.  Earnstine Regal, MD, [facility] Office: [phone]

## 2013-06-10 DIAGNOSIS — Z433 Encounter for attention to colostomy: Secondary | ICD-10-CM | POA: Diagnosis not present

## 2013-06-10 DIAGNOSIS — Z4801 Encounter for change or removal of surgical wound dressing: Secondary | ICD-10-CM | POA: Diagnosis not present

## 2013-06-10 DIAGNOSIS — I10 Essential (primary) hypertension: Secondary | ICD-10-CM | POA: Diagnosis not present

## 2013-06-10 DIAGNOSIS — Z48815 Encounter for surgical aftercare following surgery on the digestive system: Secondary | ICD-10-CM | POA: Diagnosis not present

## 2013-06-13 DIAGNOSIS — Z433 Encounter for attention to colostomy: Secondary | ICD-10-CM | POA: Diagnosis not present

## 2013-06-13 DIAGNOSIS — Z48815 Encounter for surgical aftercare following surgery on the digestive system: Secondary | ICD-10-CM | POA: Diagnosis not present

## 2013-06-13 DIAGNOSIS — I10 Essential (primary) hypertension: Secondary | ICD-10-CM | POA: Diagnosis not present

## 2013-06-13 DIAGNOSIS — Z4801 Encounter for change or removal of surgical wound dressing: Secondary | ICD-10-CM | POA: Diagnosis not present

## 2013-06-15 DIAGNOSIS — Z4801 Encounter for change or removal of surgical wound dressing: Secondary | ICD-10-CM | POA: Diagnosis not present

## 2013-06-15 DIAGNOSIS — Z48815 Encounter for surgical aftercare following surgery on the digestive system: Secondary | ICD-10-CM | POA: Diagnosis not present

## 2013-06-15 DIAGNOSIS — I10 Essential (primary) hypertension: Secondary | ICD-10-CM | POA: Diagnosis not present

## 2013-06-15 DIAGNOSIS — Z433 Encounter for attention to colostomy: Secondary | ICD-10-CM | POA: Diagnosis not present

## 2013-06-16 DIAGNOSIS — Z48815 Encounter for surgical aftercare following surgery on the digestive system: Secondary | ICD-10-CM | POA: Diagnosis not present

## 2013-06-16 DIAGNOSIS — I10 Essential (primary) hypertension: Secondary | ICD-10-CM | POA: Diagnosis not present

## 2013-06-16 DIAGNOSIS — Z433 Encounter for attention to colostomy: Secondary | ICD-10-CM | POA: Diagnosis not present

## 2013-06-16 DIAGNOSIS — Z4801 Encounter for change or removal of surgical wound dressing: Secondary | ICD-10-CM | POA: Diagnosis not present

## 2013-06-17 DIAGNOSIS — I10 Essential (primary) hypertension: Secondary | ICD-10-CM | POA: Diagnosis not present

## 2013-06-17 DIAGNOSIS — Z433 Encounter for attention to colostomy: Secondary | ICD-10-CM | POA: Diagnosis not present

## 2013-06-17 DIAGNOSIS — Z4801 Encounter for change or removal of surgical wound dressing: Secondary | ICD-10-CM | POA: Diagnosis not present

## 2013-06-17 DIAGNOSIS — Z48815 Encounter for surgical aftercare following surgery on the digestive system: Secondary | ICD-10-CM | POA: Diagnosis not present

## 2013-06-20 DIAGNOSIS — I10 Essential (primary) hypertension: Secondary | ICD-10-CM | POA: Diagnosis not present

## 2013-06-20 DIAGNOSIS — Z48815 Encounter for surgical aftercare following surgery on the digestive system: Secondary | ICD-10-CM | POA: Diagnosis not present

## 2013-06-20 DIAGNOSIS — Z4801 Encounter for change or removal of surgical wound dressing: Secondary | ICD-10-CM | POA: Diagnosis not present

## 2013-06-20 DIAGNOSIS — Z433 Encounter for attention to colostomy: Secondary | ICD-10-CM | POA: Diagnosis not present

## 2013-06-22 DIAGNOSIS — I10 Essential (primary) hypertension: Secondary | ICD-10-CM | POA: Diagnosis not present

## 2013-06-22 DIAGNOSIS — Z48815 Encounter for surgical aftercare following surgery on the digestive system: Secondary | ICD-10-CM | POA: Diagnosis not present

## 2013-06-22 DIAGNOSIS — Z4801 Encounter for change or removal of surgical wound dressing: Secondary | ICD-10-CM | POA: Diagnosis not present

## 2013-06-22 DIAGNOSIS — Z433 Encounter for attention to colostomy: Secondary | ICD-10-CM | POA: Diagnosis not present

## 2013-06-24 DIAGNOSIS — Z48815 Encounter for surgical aftercare following surgery on the digestive system: Secondary | ICD-10-CM | POA: Diagnosis not present

## 2013-06-24 DIAGNOSIS — I10 Essential (primary) hypertension: Secondary | ICD-10-CM | POA: Diagnosis not present

## 2013-06-24 DIAGNOSIS — Z4801 Encounter for change or removal of surgical wound dressing: Secondary | ICD-10-CM | POA: Diagnosis not present

## 2013-06-24 DIAGNOSIS — Z433 Encounter for attention to colostomy: Secondary | ICD-10-CM | POA: Diagnosis not present

## 2013-06-26 DIAGNOSIS — Z433 Encounter for attention to colostomy: Secondary | ICD-10-CM | POA: Diagnosis not present

## 2013-06-26 DIAGNOSIS — Z4801 Encounter for change or removal of surgical wound dressing: Secondary | ICD-10-CM | POA: Diagnosis not present

## 2013-06-26 DIAGNOSIS — I10 Essential (primary) hypertension: Secondary | ICD-10-CM | POA: Diagnosis not present

## 2013-06-26 DIAGNOSIS — Z48815 Encounter for surgical aftercare following surgery on the digestive system: Secondary | ICD-10-CM | POA: Diagnosis not present

## 2013-06-29 DIAGNOSIS — Z4801 Encounter for change or removal of surgical wound dressing: Secondary | ICD-10-CM | POA: Diagnosis not present

## 2013-06-29 DIAGNOSIS — I10 Essential (primary) hypertension: Secondary | ICD-10-CM | POA: Diagnosis not present

## 2013-06-29 DIAGNOSIS — Z433 Encounter for attention to colostomy: Secondary | ICD-10-CM | POA: Diagnosis not present

## 2013-06-29 DIAGNOSIS — Z48815 Encounter for surgical aftercare following surgery on the digestive system: Secondary | ICD-10-CM | POA: Diagnosis not present

## 2013-07-01 DIAGNOSIS — Z48815 Encounter for surgical aftercare following surgery on the digestive system: Secondary | ICD-10-CM | POA: Diagnosis not present

## 2013-07-01 DIAGNOSIS — Z4801 Encounter for change or removal of surgical wound dressing: Secondary | ICD-10-CM | POA: Diagnosis not present

## 2013-07-01 DIAGNOSIS — Z433 Encounter for attention to colostomy: Secondary | ICD-10-CM | POA: Diagnosis not present

## 2013-07-01 DIAGNOSIS — I10 Essential (primary) hypertension: Secondary | ICD-10-CM | POA: Diagnosis not present

## 2013-07-04 DIAGNOSIS — Z4801 Encounter for change or removal of surgical wound dressing: Secondary | ICD-10-CM | POA: Diagnosis not present

## 2013-07-04 DIAGNOSIS — Z433 Encounter for attention to colostomy: Secondary | ICD-10-CM | POA: Diagnosis not present

## 2013-07-04 DIAGNOSIS — I10 Essential (primary) hypertension: Secondary | ICD-10-CM | POA: Diagnosis not present

## 2013-07-04 DIAGNOSIS — Z48815 Encounter for surgical aftercare following surgery on the digestive system: Secondary | ICD-10-CM | POA: Diagnosis not present

## 2013-07-06 DIAGNOSIS — Z48815 Encounter for surgical aftercare following surgery on the digestive system: Secondary | ICD-10-CM | POA: Diagnosis not present

## 2013-07-06 DIAGNOSIS — Z4801 Encounter for change or removal of surgical wound dressing: Secondary | ICD-10-CM | POA: Diagnosis not present

## 2013-07-06 DIAGNOSIS — I10 Essential (primary) hypertension: Secondary | ICD-10-CM | POA: Diagnosis not present

## 2013-07-06 DIAGNOSIS — Z433 Encounter for attention to colostomy: Secondary | ICD-10-CM | POA: Diagnosis not present

## 2013-07-08 DIAGNOSIS — I10 Essential (primary) hypertension: Secondary | ICD-10-CM | POA: Diagnosis not present

## 2013-07-08 DIAGNOSIS — Z48815 Encounter for surgical aftercare following surgery on the digestive system: Secondary | ICD-10-CM | POA: Diagnosis not present

## 2013-07-08 DIAGNOSIS — Z433 Encounter for attention to colostomy: Secondary | ICD-10-CM | POA: Diagnosis not present

## 2013-07-08 DIAGNOSIS — Z4801 Encounter for change or removal of surgical wound dressing: Secondary | ICD-10-CM | POA: Diagnosis not present

## 2013-07-11 DIAGNOSIS — I10 Essential (primary) hypertension: Secondary | ICD-10-CM | POA: Diagnosis not present

## 2013-07-11 DIAGNOSIS — Z433 Encounter for attention to colostomy: Secondary | ICD-10-CM | POA: Diagnosis not present

## 2013-07-11 DIAGNOSIS — Z48815 Encounter for surgical aftercare following surgery on the digestive system: Secondary | ICD-10-CM | POA: Diagnosis not present

## 2013-07-11 DIAGNOSIS — Z4801 Encounter for change or removal of surgical wound dressing: Secondary | ICD-10-CM | POA: Diagnosis not present

## 2013-07-12 ENCOUNTER — Encounter (INDEPENDENT_AMBULATORY_CARE_PROVIDER_SITE_OTHER): Payer: Self-pay | Admitting: Surgery

## 2013-07-12 ENCOUNTER — Ambulatory Visit (INDEPENDENT_AMBULATORY_CARE_PROVIDER_SITE_OTHER): Payer: Medicare Other | Admitting: Surgery

## 2013-07-12 VITALS — BP 118/58 | HR 80 | Temp 98.8°F | Resp 14 | Ht 67.0 in | Wt 139.0 lb

## 2013-07-12 DIAGNOSIS — K5732 Diverticulitis of large intestine without perforation or abscess without bleeding: Secondary | ICD-10-CM

## 2013-07-12 NOTE — Progress Notes (Signed)
General Surgery [facility]  Chief Complaint  Patient presents with  . Routine Post Op    colocutaneous fistula resection Jan 2015    HISTORY: The patient is a 78 year old male who underwent Hartman's resection for colocutaneous fistula in January 2015. His midline abdominal incision has healed by secondary intention. He returns today for a wound check. He is interested in proceeding with colostomy closure in the near future.  EXAM: Surgical wound has healed remarkably over the past few weeks. It is now completely closed and epithelialized. With Valsalva there is no sign of herniation. Colostomy in the left mid abdomen is functioning normally.  IMPRESSION: History of colocutaneous fistula from diverticular disease  PLAN: We will initiate the process of colostomy closure. I would like the patient to be evaluated by his gastroenterologist and have colonoscopy and proctoscopy prior to planning for colostomy closure. We will make arrangements for this consultation.  I have asked the patient to wait at least 3-4 months from the time of his surgery until colostomy closure. We will schedule his procedure after he has been evaluated by gastroenterology.  Earnstine Regal, MD, [facility]   Visit Diagnoses: 1. Diverticulitis of sigmoid colon

## 2013-07-12 NOTE — Addendum Note (Signed)
Addended by: Dois Davenport on: 07/12/2013 02:05 PM   Modules accepted: Orders

## 2013-07-12 NOTE — Patient Instructions (Signed)
  COCOA BUTTER & VITAMIN E CREAM  ([employer]mer[employer] or other brand)  Apply cocoa butter/vitamin E cream to your incision 2 - 3 times daily.  Massage cream into incision for one minute with each application.  Use sunscreen (50 SPF or higher) for first 6 months after surgery if area is exposed to sun.  You may substitute Mederma or other scar reducing creams as desired.   

## 2013-07-13 ENCOUNTER — Telehealth (INDEPENDENT_AMBULATORY_CARE_PROVIDER_SITE_OTHER): Payer: Self-pay | Admitting: *Deleted

## 2013-07-13 DIAGNOSIS — Z48815 Encounter for surgical aftercare following surgery on the digestive system: Secondary | ICD-10-CM | POA: Diagnosis not present

## 2013-07-13 DIAGNOSIS — Z4801 Encounter for change or removal of surgical wound dressing: Secondary | ICD-10-CM | POA: Diagnosis not present

## 2013-07-13 DIAGNOSIS — I10 Essential (primary) hypertension: Secondary | ICD-10-CM | POA: Diagnosis not present

## 2013-07-13 DIAGNOSIS — Z433 Encounter for attention to colostomy: Secondary | ICD-10-CM | POA: Diagnosis not present

## 2013-07-13 NOTE — Telephone Encounter (Signed)
LMOM for pt to return my call.  I was calling to inform him of the appt for him to go see Dr. Michail Sermon at [facility] on 07/21/13 with an arrival time of 12:45pm to discuss and schedule colonoscopy.  If pt needs to reschedule he should call [facility] at [phone].

## 2013-07-14 DIAGNOSIS — I10 Essential (primary) hypertension: Secondary | ICD-10-CM | POA: Diagnosis not present

## 2013-07-14 DIAGNOSIS — Z433 Encounter for attention to colostomy: Secondary | ICD-10-CM | POA: Diagnosis not present

## 2013-07-14 DIAGNOSIS — Z48815 Encounter for surgical aftercare following surgery on the digestive system: Secondary | ICD-10-CM | POA: Diagnosis not present

## 2013-07-14 DIAGNOSIS — Z4801 Encounter for change or removal of surgical wound dressing: Secondary | ICD-10-CM | POA: Diagnosis not present

## 2013-07-14 NOTE — Telephone Encounter (Signed)
Patient called back and was given below message.  Patient states understanding and agreeable at this time.

## 2013-07-21 DIAGNOSIS — K632 Fistula of intestine: Secondary | ICD-10-CM | POA: Diagnosis not present

## 2013-07-21 DIAGNOSIS — Z1211 Encounter for screening for malignant neoplasm of colon: Secondary | ICD-10-CM | POA: Diagnosis not present

## 2013-07-27 DIAGNOSIS — Z950 Presence of cardiac pacemaker: Secondary | ICD-10-CM | POA: Diagnosis not present

## 2013-08-15 DIAGNOSIS — I4891 Unspecified atrial fibrillation: Secondary | ICD-10-CM | POA: Diagnosis not present

## 2013-08-15 DIAGNOSIS — E785 Hyperlipidemia, unspecified: Secondary | ICD-10-CM | POA: Diagnosis not present

## 2013-08-15 DIAGNOSIS — K5730 Diverticulosis of large intestine without perforation or abscess without bleeding: Secondary | ICD-10-CM | POA: Diagnosis not present

## 2013-08-15 DIAGNOSIS — I10 Essential (primary) hypertension: Secondary | ICD-10-CM | POA: Diagnosis not present

## 2013-08-16 DIAGNOSIS — Z1211 Encounter for screening for malignant neoplasm of colon: Secondary | ICD-10-CM | POA: Diagnosis not present

## 2013-08-16 DIAGNOSIS — K648 Other hemorrhoids: Secondary | ICD-10-CM | POA: Diagnosis not present

## 2013-08-16 DIAGNOSIS — K5730 Diverticulosis of large intestine without perforation or abscess without bleeding: Secondary | ICD-10-CM | POA: Diagnosis not present

## 2013-10-12 ENCOUNTER — Encounter (INDEPENDENT_AMBULATORY_CARE_PROVIDER_SITE_OTHER): Payer: Self-pay

## 2013-10-26 DIAGNOSIS — I495 Sick sinus syndrome: Secondary | ICD-10-CM | POA: Diagnosis not present

## 2013-10-26 DIAGNOSIS — Z9049 Acquired absence of other specified parts of digestive tract: Secondary | ICD-10-CM | POA: Diagnosis not present

## 2013-10-26 DIAGNOSIS — Z950 Presence of cardiac pacemaker: Secondary | ICD-10-CM | POA: Diagnosis not present

## 2013-10-26 DIAGNOSIS — I10 Essential (primary) hypertension: Secondary | ICD-10-CM | POA: Diagnosis not present

## 2013-11-03 DIAGNOSIS — C61 Malignant neoplasm of prostate: Secondary | ICD-10-CM | POA: Diagnosis not present

## 2013-11-09 ENCOUNTER — Ambulatory Visit (INDEPENDENT_AMBULATORY_CARE_PROVIDER_SITE_OTHER): Payer: Medicare Other | Admitting: Surgery

## 2013-11-09 ENCOUNTER — Encounter (INDEPENDENT_AMBULATORY_CARE_PROVIDER_SITE_OTHER): Payer: Self-pay

## 2013-11-09 ENCOUNTER — Other Ambulatory Visit (INDEPENDENT_AMBULATORY_CARE_PROVIDER_SITE_OTHER): Payer: Self-pay | Admitting: Surgery

## 2013-11-09 ENCOUNTER — Encounter (INDEPENDENT_AMBULATORY_CARE_PROVIDER_SITE_OTHER): Payer: Self-pay | Admitting: Surgery

## 2013-11-09 VITALS — BP 128/72 | HR 71 | Temp 97.6°F | Resp 16 | Ht 67.0 in | Wt 145.2 lb

## 2013-11-09 DIAGNOSIS — Z933 Colostomy status: Secondary | ICD-10-CM | POA: Diagnosis not present

## 2013-11-09 DIAGNOSIS — K5732 Diverticulitis of large intestine without perforation or abscess without bleeding: Secondary | ICD-10-CM | POA: Diagnosis not present

## 2013-11-09 NOTE — Patient Instructions (Signed)
COLON BOWEL PREP                                                                          °Please follow the instructions carefully. It is important to clean out your bowels & take the prescribed antibiotic pills to lower your chances of a wound infection or abscess.  ° °FIVE DAYS PRIOR TO YOUR SURGERY ° °Stop eating any nuts, popcorn, or fruit with seeds. Stop all fiber supplements such as Metamucil, Citrucel, etc.   °Hold taking any blood thinning anticoagulation medication (ex: aspirin, warfarin/Coumadin, Plavix, Xarelto, Eliquis, Pradaxa, etc) as recommended by your medical/cardiology doctor ° °Obtain what you need at a pharmacy of your choice: °-Filled out prescriptions for your oral antibiotics (Neomycin & Metronidazole)  °-A bottle of MiraLax / Glycolax (288g) - no prescription required  °-A large bottle of Gatorade / Powerade (64oz)  °-Dulcolax tablets (4 tabs) - no prescription required  ° ° °DAY PRIOR TO SURGERY  ° °7:00am °Swallow 4 Dulcolax tablets with some water °Drink plenty of clear liquids all day to avoid getting dehydrated (Water, juice, soda, coffee, tea, bouillon, jello, etc.) ° °10:00am °Mix the bottle of MiraLax with the 64-oz bottle of Gatorade.  Drink the Gatorade mixture gradually over the next few hours (8oz glass every 15-30 minutes) until gone. You should finish by 2pm. ° °2:00pm °Take 2 Neomycin 500mg tablets & 2 Metronidazole 500mg tablets ° °3:00pm °Take 2 Neomycin 500mg tablets & 2 Metronidazole 500mg tablets  °Drink plenty of clear liquids all evening to avoid getting dehydrated ° °10:00pm °Take 2 Neomycin 500mg tablets & 2 Metronidazole 500mg tablets  °Do not eat or drink anything after bedtime (midnight) the night before your surgery. ° ° °MORNING OF SURGERY °Remember to not to drink or eat anything that morning  °Hold or take medications as recommended by the hospital staff at your Preoperative visit ° °If you have questions or concerns, please call [facility] [phone] during business hours to speak to the clinical staff for advice. °

## 2013-11-09 NOTE — Progress Notes (Signed)
General Surgery [facility]  Chief Complaint  Patient presents with  . Follow-up    evaluate for colostomy closure    HISTORY: The patient is a 78 year old male known to my practice from complicated diverticular disease. Patient had been treated with nonoperative management of acute diverticulitis with abscess. 1 year later he developed a colocutaneous fistula. He was taken to the operating room in January 2015 and underwent segmental colectomy and colostomy. He has recovered nicely. In April 2015 he underwent colonoscopy and proctoscopy by Dr. Wilford Corner.  This showed some residual diverticulosis in the descending colon and some internal hemorrhoids, but was otherwise normal.  Patient is anxious to proceed with colostomy closure. He is currently staying in a homeless shelter. While that is adequate for the time being, he is working on more permanent housing.  PERTINENT REVIEW OF SYSTEMS: Denies any abdominal pain. Denies any obstructive symptoms.  EXAM: HEENT: normocephalic; pupils equal and reactive; sclerae clear; dentition good; mucous membranes moist NECK:  symmetric on extension; no palpable anterior or posterior cervical lymphadenopathy; no supraclavicular masses; no tenderness CHEST: clear to auscultation bilaterally without rales, rhonchi, or wheezes CARDIAC: regular rate and rhythm without significant murmur; peripheral pulses are full ABD:  Soft without distention; lower midline surgical wound well healed and completely epithelialized; colostomy is painted viable and functioning normally in the mid left abdominal wall; no tenderness EXT:  non-tender without edema; no deformity NEURO: no gross focal deficits; no sign of tremor   IMPRESSION: #1 history of complex diverticular disease with colocutaneous fistula #2 Colostomy in place  PLAN: The patient and I reviewed his colonoscopy report from gastroenterology performed in April 2015.  Patient would  like to proceed with colostomy closure in the near future. We discussed the risk and benefits of the procedure. We discussed the location of the surgical wounds. We discussed leaving the skin incisions open and allowing them to heal by secondary intention. We discussed the hospital stay to be anticipated. Patient understands and wishes to proceed in the near future.  The risks and benefits of the procedure have been discussed at length with the patient.  The patient understands the proposed procedure, potential alternative treatments, and the course of recovery to be expected.  All of the patient's questions have been answered at this time.  The patient wishes to proceed with surgery.  Earnstine Regal, MD, [facility] Office: [phone]  Visit Diagnoses: 1. Diverticulitis of sigmoid colon   2. Colostomy in place

## 2013-11-14 DIAGNOSIS — K5730 Diverticulosis of large intestine without perforation or abscess without bleeding: Secondary | ICD-10-CM | POA: Diagnosis not present

## 2013-11-14 DIAGNOSIS — I4891 Unspecified atrial fibrillation: Secondary | ICD-10-CM | POA: Diagnosis not present

## 2013-11-14 DIAGNOSIS — I10 Essential (primary) hypertension: Secondary | ICD-10-CM | POA: Diagnosis not present

## 2013-11-14 DIAGNOSIS — E785 Hyperlipidemia, unspecified: Secondary | ICD-10-CM | POA: Diagnosis not present

## 2013-11-16 ENCOUNTER — Other Ambulatory Visit (HOSPITAL_COMMUNITY): Payer: Self-pay | Admitting: *Deleted

## 2013-11-16 NOTE — Patient Instructions (Addendum)
[name] [name]  11/16/2013                           YOUR PROCEDURE IS SCHEDULED ON: 11/25/13 AT 11:45 AM               ENTER [place] ENTRANCE AND                            FOLLOW  SIGNS TO SHORT STAY CENTER                 ARRIVE AT SHORT STAY AT: 9:45 AM               CALL THIS NUMBER IF ANY PROBLEMS THE DAY OF SURGERY :               [phone]                                REMEMBER:   Do not eat food or drink liquids AFTER MIDNIGHT   May have clear liquids UNTIL 6 HOURS BEFORE SURGERY (5:45 AM)               Take these medicines the morning of surgery with               A SIPS OF WATER :  METOPROLOL              STOP ASPIRIN AND HERBAL MEDS 5 DAYS PREOP       Do not wear jewelry, make-up   Do not wear lotions, powders, or perfumes.   Do not shave legs or underarms 12 hrs. before surgery (men may shave face)  Do not bring valuables to the hospital.  Contacts, dentures or bridgework may not be worn into surgery.  Leave suitcase in the car. After surgery it may be brought to your room.  For patients admitted to the hospital more than one night, checkout time is            11:00 AM                                                       The day of discharge.   Patients discharged the day of surgery will not be allowed to drive home.            If going home same day of surgery, must have someone stay with you              FIRST 24 hrs at home and arrange for some one to drive you              home from hospital.   ________________________________________________________________________                                                                        [place]  Before surgery, you can play  an important role.  Because skin is not sterile, your skin needs to be as free of germs as possible.  You can reduce the number of germs on your skin by washing with CHG (chlorahexidine gluconate) soap before surgery.  CHG  is an antiseptic cleaner which kills germs and bonds with the skin to continue killing germs even after washing. Please DO NOT use if you have an allergy to CHG or antibacterial soaps.  If your skin becomes reddened/irritated stop using the CHG and inform your nurse when you arrive at Short Stay. Do not shave (including legs and underarms) for at least 48 hours prior to the first CHG shower.  You may shave your face. Please follow these instructions carefully:   1.  Shower with CHG Soap the night before surgery and the  morning of Surgery.   2.  If you choose to wash your hair, wash your hair first as usual with your  normal  Shampoo.   3.  After you shampoo, rinse your hair and body thoroughly to remove the  shampoo.                                         4.  Use CHG as you would any other liquid soap.  You can apply chg directly  to the skin and wash . Gently wash with scrungie or clean wascloth    5.  Apply the CHG Soap to your body ONLY FROM THE NECK DOWN.   Do not use on open                           Wound or open sores. Avoid contact with eyes, ears mouth and genitals (private parts).                        Genitals (private parts) with your normal soap.              6.  Wash thoroughly, paying special attention to the area where your surgery  will be performed.   7.  Thoroughly rinse your body with warm water from the neck down.   8.  DO NOT shower/wash with your normal soap after using and rinsing off  the CHG Soap .                9.  Pat yourself dry with a clean towel.             10.  Wear clean pajamas.             11.  Place clean sheets on your bed the night of your first shower and do not  sleep with pets.  Day of Surgery : Do not apply any lotions/deodorants the morning of surgery.  Please wear clean clothes to the hospital/surgery center.  FAILURE TO FOLLOW THESE INSTRUCTIONS MAY RESULT IN THE CANCELLATION OF YOUR SURGERY    PATIENT  SIGNATURE_________________________________  ______________________________________________________________________    WHAT IS A BLOOD TRANSFUSION? Blood Transfusion Information  A transfusion is the replacement of blood or some of its parts. Blood is made up of multiple cells which provide different functions.  Red blood cells carry oxygen and are used for blood loss replacement.  White blood cells fight against infection.  Platelets control bleeding.  Plasma helps clot blood.  Other  blood products are available for specialized needs, such as hemophilia or other clotting disorders. BEFORE THE TRANSFUSION  Who gives blood for transfusions?   Healthy volunteers who are fully evaluated to make sure their blood is safe. This is blood bank blood. Transfusion therapy is the safest it has ever been in the practice of medicine. Before blood is taken from a donor, a complete history is taken to make sure that person has no history of diseases nor engages in risky social behavior (examples are intravenous drug use or sexual activity with multiple partners). The donor's travel history is screened to minimize risk of transmitting infections, such as malaria. The donated blood is tested for signs of infectious diseases, such as HIV and hepatitis. The blood is then tested to be sure it is compatible with you in order to minimize the chance of a transfusion reaction. If you or a relative donates blood, this is often done in anticipation of surgery and is not appropriate for emergency situations. It takes many days to process the donated blood. RISKS AND COMPLICATIONS Although transfusion therapy is very safe and saves many lives, the main dangers of transfusion include:   Getting an infectious disease.  Developing a transfusion reaction. This is an allergic reaction to something in the blood you were given. Every precaution is taken to prevent this. The decision to have a blood transfusion has been  considered carefully by your caregiver before blood is given. Blood is not given unless the benefits outweigh the risks. AFTER THE TRANSFUSION  Right after receiving a blood transfusion, you will usually feel much better and more energetic. This is especially true if your red blood cells have gotten low (anemic). The transfusion raises the level of the red blood cells which carry oxygen, and this usually causes an energy increase.  The nurse administering the transfusion will monitor you carefully for complications. HOME CARE INSTRUCTIONS  No special instructions are needed after a transfusion. You may find your energy is better. Speak with your caregiver about any limitations on activity for underlying diseases you may have. SEEK MEDICAL CARE IF:   Your condition is not improving after your transfusion.  You develop redness or irritation at the intravenous (IV) site. SEEK IMMEDIATE MEDICAL CARE IF:  Any of the following symptoms occur over the next 12 hours:  Shaking chills.  You have a temperature by mouth above 102 F (38.9 C), not controlled by medicine.  Chest, back, or muscle pain.  People around you feel you are not acting correctly or are confused.  Shortness of breath or difficulty breathing.  Dizziness and fainting.  You get a rash or develop hives.  You have a decrease in urine output.  Your urine turns a dark color or changes to pink, red, or brown. Any of the following symptoms occur over the next 10 days:  You have a temperature by mouth above 102 F (38.9 C), not controlled by medicine.  Shortness of breath.  Weakness after normal activity.  The white part of the eye turns yellow (jaundice).  You have a decrease in the amount of urine or are urinating less often.  Your urine turns a dark color or changes to pink, red, or brown. Document Released: 04/18/2000 Document Revised: 07/14/2011 Document Reviewed: 12/06/2007 [facility] Patient Information 2014  [place], [place].  _______________________________________________________________________

## 2013-11-17 ENCOUNTER — Encounter (HOSPITAL_COMMUNITY)
Admission: RE | Admit: 2013-11-17 | Discharge: 2013-11-17 | Disposition: A | Discharge status: Home / Self Care | Payer: Medicare Other | Source: Ambulatory Visit | Attending: Surgery | Admitting: Surgery

## 2013-11-17 ENCOUNTER — Encounter (HOSPITAL_COMMUNITY): Payer: Self-pay

## 2013-11-17 ENCOUNTER — Ambulatory Visit (HOSPITAL_COMMUNITY)
Admission: RE | Admit: 2013-11-17 | Discharge: 2013-11-17 | Disposition: A | Discharge status: Home / Self Care | Payer: Medicare Other | Source: Ambulatory Visit | Attending: Anesthesiology | Admitting: Anesthesiology

## 2013-11-17 ENCOUNTER — Encounter (HOSPITAL_COMMUNITY): Payer: Self-pay | Admitting: Pharmacy Technician

## 2013-11-17 DIAGNOSIS — Z01818 Encounter for other preprocedural examination: Secondary | ICD-10-CM | POA: Insufficient documentation

## 2013-11-17 DIAGNOSIS — Z95810 Presence of automatic (implantable) cardiac defibrillator: Secondary | ICD-10-CM | POA: Insufficient documentation

## 2013-11-17 DIAGNOSIS — Z01812 Encounter for preprocedural laboratory examination: Secondary | ICD-10-CM | POA: Diagnosis not present

## 2013-11-17 HISTORY — DX: Frequency of micturition: R35.0

## 2013-11-17 HISTORY — DX: Sleep disorder, unspecified: G47.9

## 2013-11-17 HISTORY — DX: Disorder of thyroid, unspecified: E07.9

## 2013-11-17 HISTORY — DX: Nocturia: R35.1

## 2013-11-17 HISTORY — DX: Colostomy status: Z93.3

## 2013-11-17 LAB — BASIC METABOLIC PANEL
ANION GAP: 11 (ref 5–15)
BUN: 21 mg/dL (ref 6–23)
CALCIUM: 9 mg/dL (ref 8.4–10.5)
CHLORIDE: 104 meq/L (ref 96–112)
CO2: 26 meq/L (ref 19–32)
Creatinine, Ser: 1.64 mg/dL — ABNORMAL HIGH (ref 0.50–1.35)
GFR calc non Af Amer: 39 mL/min — ABNORMAL LOW (ref >90)
GFR, EST AFRICAN AMERICAN: 46 mL/min — AB (ref >90)
Glucose, Bld: 80 mg/dL (ref 70–99)
Potassium: 4.3 mEq/L (ref 3.7–5.3)
Sodium: 141 mEq/L (ref 137–147)

## 2013-11-17 LAB — CBC
HCT: 38.8 % — ABNORMAL LOW (ref 39.0–52.0)
HEMOGLOBIN: 12.8 g/dL — AB (ref 13.0–17.0)
MCH: 28.8 pg (ref 26.0–34.0)
MCHC: 33 g/dL (ref 30.0–36.0)
MCV: 87.4 fL (ref 78.0–100.0)
Platelets: 240 10*3/uL (ref 150–400)
RBC: 4.44 MIL/uL (ref 4.22–5.81)
RDW: 17 % — AB (ref 11.5–15.5)
WBC: 5.8 10*3/uL (ref 4.0–10.5)

## 2013-11-17 NOTE — Progress Notes (Signed)
Quick Note:  These results are acceptable for scheduled surgery.  Tayvion Lauder M. Desirae Mancusi, MD, FACS [facility] Office: [phone]   ______ 

## 2013-11-17 NOTE — Progress Notes (Signed)
Quick Note:  Pre-operative chest x-ray is acceptable for scheduled surgery.  Siarah Deleo M. Zahari Fazzino, MD, FACS [facility] Office: [phone]   ______ 

## 2013-11-17 NOTE — Progress Notes (Signed)
11/17/13 1312  OBSTRUCTIVE SLEEP APNEA  Have you ever been diagnosed with sleep apnea through a sleep study? No  Do you snore loudly (loud enough to be heard through closed doors)?  0  Do you often feel tired, fatigued, or sleepy during the daytime? 1  Has anyone observed you stop breathing during your sleep? 1  Do you have, or are you being treated for high blood pressure? 1  BMI more than 35 kg/m2? 0  Age over 76 years old? 1  Neck circumference greater than 40 cm/16 inches? 0  Gender: 1  Obstructive Sleep Apnea Score 5  Score 4 or greater  Results sent to PCP

## 2013-11-17 NOTE — Progress Notes (Signed)
BMET faxed to Dr. Harlow Asa

## 2013-11-25 ENCOUNTER — Encounter (HOSPITAL_COMMUNITY)
Admission: RE | Disposition: A | Discharge status: Home / Self Care | Payer: Self-pay | Source: Ambulatory Visit | Attending: Surgery

## 2013-11-25 ENCOUNTER — Encounter (HOSPITAL_COMMUNITY): Payer: Medicare Other | Admitting: Certified Registered Nurse Anesthetist

## 2013-11-25 ENCOUNTER — Encounter (HOSPITAL_COMMUNITY): Payer: Self-pay | Admitting: *Deleted

## 2013-11-25 ENCOUNTER — Inpatient Hospital Stay (HOSPITAL_COMMUNITY): Payer: Medicare Other | Admitting: Certified Registered Nurse Anesthetist

## 2013-11-25 ENCOUNTER — Inpatient Hospital Stay (HOSPITAL_COMMUNITY)
Admission: RE | Admit: 2013-11-25 | Discharge: 2013-11-30 | DRG: 345 | Disposition: A | Discharge status: Home / Self Care | Payer: Medicare Other | Source: Ambulatory Visit | Attending: Surgery | Admitting: Surgery

## 2013-11-25 DIAGNOSIS — K5790 Diverticulosis of intestine, part unspecified, without perforation or abscess without bleeding: Secondary | ICD-10-CM | POA: Diagnosis present

## 2013-11-25 DIAGNOSIS — K5732 Diverticulitis of large intestine without perforation or abscess without bleeding: Secondary | ICD-10-CM | POA: Diagnosis present

## 2013-11-25 DIAGNOSIS — Z933 Colostomy status: Secondary | ICD-10-CM

## 2013-11-25 DIAGNOSIS — Z5900 Homelessness unspecified: Secondary | ICD-10-CM

## 2013-11-25 DIAGNOSIS — Z433 Encounter for attention to colostomy: Principal | ICD-10-CM

## 2013-11-25 DIAGNOSIS — K9403 Colostomy malfunction: Secondary | ICD-10-CM | POA: Diagnosis not present

## 2013-11-25 DIAGNOSIS — K5730 Diverticulosis of large intestine without perforation or abscess without bleeding: Secondary | ICD-10-CM | POA: Diagnosis not present

## 2013-11-25 HISTORY — PX: COLOSTOMY CLOSURE: SHX1381

## 2013-11-25 LAB — CBC
HEMATOCRIT: 40.9 % (ref 39.0–52.0)
HEMOGLOBIN: 13.7 g/dL (ref 13.0–17.0)
MCH: 29.1 pg (ref 26.0–34.0)
MCHC: 33.5 g/dL (ref 30.0–36.0)
MCV: 87 fL (ref 78.0–100.0)
Platelets: 214 10*3/uL (ref 150–400)
RBC: 4.7 MIL/uL (ref 4.22–5.81)
RDW: 16.2 % — ABNORMAL HIGH (ref 11.5–15.5)
WBC: 10 10*3/uL (ref 4.0–10.5)

## 2013-11-25 LAB — TYPE AND SCREEN
ABO/RH(D): O POS
ANTIBODY SCREEN: NEGATIVE

## 2013-11-25 LAB — CREATININE, SERUM
CREATININE: 1.34 mg/dL (ref 0.50–1.35)
GFR calc Af Amer: 58 mL/min — ABNORMAL LOW (ref >90)
GFR calc non Af Amer: 50 mL/min — ABNORMAL LOW (ref >90)

## 2013-11-25 SURGERY — COLOSTOMY CLOSURE
Anesthesia: General | Site: Abdomen

## 2013-11-25 MED ORDER — NALOXONE HCL 0.4 MG/ML IJ SOLN: 0.4000 mg | INTRAMUSCULAR | Status: DC | PRN

## 2013-11-25 MED ORDER — ONDANSETRON HCL 4 MG/2ML IJ SOLN: 4.0000 mg | Freq: Four times a day (QID) | INTRAMUSCULAR | Status: DC | PRN

## 2013-11-25 MED ORDER — FENTANYL CITRATE 0.05 MG/ML IJ SOLN
INTRAMUSCULAR | Status: AC
  Filled 2013-11-25: qty 2

## 2013-11-25 MED ORDER — PROPOFOL 10 MG/ML IV BOLUS
INTRAVENOUS | Status: DC | PRN
  Administered 2013-11-25: 140 mg via INTRAVENOUS

## 2013-11-25 MED ORDER — ROCURONIUM BROMIDE 100 MG/10ML IV SOLN
INTRAVENOUS | Status: DC | PRN
  Administered 2013-11-25: 40 mg via INTRAVENOUS
  Administered 2013-11-25: 10 mg via INTRAVENOUS

## 2013-11-25 MED ORDER — HYDROMORPHONE HCL PF 1 MG/ML IJ SOLN: 0.2500 mg | INTRAMUSCULAR | Status: DC | PRN

## 2013-11-25 MED ORDER — EPHEDRINE SULFATE 50 MG/ML IJ SOLN
INTRAMUSCULAR | Status: DC | PRN
  Administered 2013-11-25: 10 mg via INTRAVENOUS

## 2013-11-25 MED ORDER — FENTANYL CITRATE 0.05 MG/ML IJ SOLN
INTRAMUSCULAR | Status: AC
  Filled 2013-11-25: qty 5

## 2013-11-25 MED ORDER — GLYCOPYRROLATE 0.2 MG/ML IJ SOLN
INTRAMUSCULAR | Status: DC | PRN
  Administered 2013-11-25: 0.6 mg via INTRAVENOUS

## 2013-11-25 MED ORDER — ONDANSETRON HCL 4 MG PO TABS: 4.0000 mg | ORAL_TABLET | Freq: Four times a day (QID) | ORAL | Status: DC | PRN

## 2013-11-25 MED ORDER — PROPOFOL 10 MG/ML IV BOLUS
INTRAVENOUS | Status: AC
  Filled 2013-11-25: qty 20

## 2013-11-25 MED ORDER — PROMETHAZINE HCL 25 MG/ML IJ SOLN: 6.2500 mg | INTRAMUSCULAR | Status: DC | PRN

## 2013-11-25 MED ORDER — MEPERIDINE HCL 50 MG/ML IJ SOLN: 6.2500 mg | INTRAMUSCULAR | Status: DC | PRN

## 2013-11-25 MED ORDER — LIDOCAINE HCL (CARDIAC) 20 MG/ML IV SOLN
INTRAVENOUS | Status: AC
  Filled 2013-11-25: qty 5

## 2013-11-25 MED ORDER — ONDANSETRON HCL 4 MG/2ML IJ SOLN
INTRAMUSCULAR | Status: AC
  Filled 2013-11-25: qty 2

## 2013-11-25 MED ORDER — DEXAMETHASONE SODIUM PHOSPHATE 10 MG/ML IJ SOLN
INTRAMUSCULAR | Status: AC
  Filled 2013-11-25: qty 1

## 2013-11-25 MED ORDER — HYDROMORPHONE 0.3 MG/ML IV SOLN
INTRAVENOUS | Status: AC
  Administered 2013-11-26: 0.3 mg
  Filled 2013-11-25: qty 25

## 2013-11-25 MED ORDER — DIPHENHYDRAMINE HCL 12.5 MG/5ML PO ELIX
12.5000 mg | ORAL_SOLUTION | Freq: Four times a day (QID) | ORAL | Status: DC | PRN
  Filled 2013-11-25: qty 5

## 2013-11-25 MED ORDER — FENTANYL CITRATE 0.05 MG/ML IJ SOLN
25.0000 ug | INTRAMUSCULAR | Status: DC | PRN
  Administered 2013-11-25: 25 ug via INTRAVENOUS
  Administered 2013-11-25: 50 ug via INTRAVENOUS
  Administered 2013-11-25: 25 ug via INTRAVENOUS

## 2013-11-25 MED ORDER — LIDOCAINE HCL 1 % IJ SOLN
INTRAMUSCULAR | Status: DC | PRN
  Administered 2013-11-25: 60 mg via INTRADERMAL

## 2013-11-25 MED ORDER — HYDRALAZINE HCL 20 MG/ML IJ SOLN: 0.5000 mg | Freq: Once | INTRAMUSCULAR | Status: DC

## 2013-11-25 MED ORDER — HYDROMORPHONE HCL PF 1 MG/ML IJ SOLN
INTRAMUSCULAR | Status: AC
  Administered 2013-11-25: 1 mg
  Filled 2013-11-25: qty 1

## 2013-11-25 MED ORDER — 0.9 % SODIUM CHLORIDE (POUR BTL) OPTIME
TOPICAL | Status: DC | PRN
  Administered 2013-11-25: 2000 mL

## 2013-11-25 MED ORDER — HYDRALAZINE HCL 20 MG/ML IJ SOLN
INTRAMUSCULAR | Status: AC
  Filled 2013-11-25: qty 1

## 2013-11-25 MED ORDER — TAMSULOSIN HCL 0.4 MG PO CAPS
0.8000 mg | ORAL_CAPSULE | Freq: Every day | ORAL | Status: DC
  Administered 2013-11-25 – 2013-11-29 (×5): 0.8 mg via ORAL
  Filled 2013-11-25 (×6): qty 2

## 2013-11-25 MED ORDER — OLMESARTAN MEDOXOMIL-HCTZ 40-12.5 MG PO TABS: 1.0000 | ORAL_TABLET | Freq: Every morning | ORAL | Status: DC

## 2013-11-25 MED ORDER — SODIUM CHLORIDE 0.9 % IJ SOLN: 9.0000 mL | INTRAMUSCULAR | Status: DC | PRN

## 2013-11-25 MED ORDER — EPHEDRINE SULFATE 50 MG/ML IJ SOLN
INTRAMUSCULAR | Status: AC
  Filled 2013-11-25: qty 1

## 2013-11-25 MED ORDER — ONDANSETRON HCL 4 MG/2ML IJ SOLN
INTRAMUSCULAR | Status: DC | PRN
  Administered 2013-11-25: 4 mg via INTRAVENOUS

## 2013-11-25 MED ORDER — SODIUM CHLORIDE 0.9 % IV SOLN
1.0000 g | INTRAVENOUS | Status: AC
  Administered 2013-11-25: 1 g via INTRAVENOUS
  Filled 2013-11-25: qty 1

## 2013-11-25 MED ORDER — HYDROCHLOROTHIAZIDE 12.5 MG PO CAPS
12.5000 mg | ORAL_CAPSULE | Freq: Every day | ORAL | Status: DC
  Administered 2013-11-26 – 2013-11-30 (×5): 12.5 mg via ORAL
  Filled 2013-11-25 (×5): qty 1

## 2013-11-25 MED ORDER — LACTATED RINGERS IV SOLN
INTRAVENOUS | Status: DC | PRN
  Administered 2013-11-25 (×3): via INTRAVENOUS

## 2013-11-25 MED ORDER — HYDROMORPHONE HCL PF 1 MG/ML IJ SOLN
INTRAMUSCULAR | Status: DC | PRN
  Administered 2013-11-25 (×2): 1 mg via INTRAVENOUS

## 2013-11-25 MED ORDER — HYDRALAZINE HCL 20 MG/ML IJ SOLN
2.5000 mg | Freq: Once | INTRAMUSCULAR | Status: AC
Start: 2013-11-25 — End: 2013-11-25
  Administered 2013-11-25: 3 mg via INTRAVENOUS

## 2013-11-25 MED ORDER — GLYCOPYRROLATE 0.2 MG/ML IJ SOLN
INTRAMUSCULAR | Status: AC
  Filled 2013-11-25: qty 3

## 2013-11-25 MED ORDER — NEOSTIGMINE METHYLSULFATE 10 MG/10ML IV SOLN
INTRAVENOUS | Status: DC | PRN
  Administered 2013-11-25: 4 mg via INTRAVENOUS

## 2013-11-25 MED ORDER — IRBESARTAN 300 MG PO TABS
300.0000 mg | ORAL_TABLET | Freq: Every day | ORAL | Status: DC
  Administered 2013-11-26 – 2013-11-30 (×5): 300 mg via ORAL
  Filled 2013-11-25 (×5): qty 1

## 2013-11-25 MED ORDER — ALVIMOPAN 12 MG PO CAPS: 12.0000 mg | ORAL_CAPSULE | Freq: Two times a day (BID) | ORAL | Status: DC

## 2013-11-25 MED ORDER — HYDROMORPHONE HCL PF 2 MG/ML IJ SOLN
INTRAMUSCULAR | Status: AC
  Filled 2013-11-25: qty 1

## 2013-11-25 MED ORDER — METOPROLOL TARTRATE 25 MG PO TABS
25.0000 mg | ORAL_TABLET | Freq: Two times a day (BID) | ORAL | Status: DC
  Administered 2013-11-25 – 2013-11-30 (×10): 25 mg via ORAL
  Filled 2013-11-25 (×12): qty 1

## 2013-11-25 MED ORDER — KCL IN DEXTROSE-NACL 30-5-0.45 MEQ/L-%-% IV SOLN
INTRAVENOUS | Status: DC
  Administered 2013-11-25 – 2013-11-29 (×6): via INTRAVENOUS
  Filled 2013-11-25 (×8): qty 1000

## 2013-11-25 MED ORDER — FENTANYL CITRATE 0.05 MG/ML IJ SOLN
INTRAMUSCULAR | Status: DC | PRN
  Administered 2013-11-25: 50 ug via INTRAVENOUS
  Administered 2013-11-25: 100 ug via INTRAVENOUS
  Administered 2013-11-25 (×2): 50 ug via INTRAVENOUS
  Administered 2013-11-25: 100 ug via INTRAVENOUS

## 2013-11-25 MED ORDER — DIPHENHYDRAMINE HCL 50 MG/ML IJ SOLN: 12.5000 mg | Freq: Four times a day (QID) | INTRAMUSCULAR | Status: DC | PRN

## 2013-11-25 MED ORDER — SODIUM CHLORIDE 0.9 % IJ SOLN
INTRAMUSCULAR | Status: AC
  Filled 2013-11-25: qty 10

## 2013-11-25 MED ORDER — ENOXAPARIN SODIUM 40 MG/0.4ML ~~LOC~~ SOLN
40.0000 mg | SUBCUTANEOUS | Status: DC
  Administered 2013-11-26 – 2013-11-30 (×5): 40 mg via SUBCUTANEOUS
  Filled 2013-11-25 (×5): qty 0.4

## 2013-11-25 MED ORDER — HYDROMORPHONE 0.3 MG/ML IV SOLN
INTRAVENOUS | Status: DC
  Administered 2013-11-25 (×2): 0.3 mg via INTRAVENOUS
  Administered 2013-11-25 – 2013-11-26 (×2): 0.6 mg via INTRAVENOUS
  Administered 2013-11-26: 1.5 mg via INTRAVENOUS
  Administered 2013-11-26: 4.2 mg via INTRAVENOUS
  Administered 2013-11-26: 0.9 mg via INTRAVENOUS
  Administered 2013-11-26: 2.1 mL via INTRAVENOUS
  Administered 2013-11-27: 2.7 mg via INTRAVENOUS
  Administered 2013-11-27 (×2): 0.6 mL via INTRAVENOUS
  Administered 2013-11-27 – 2013-11-28 (×3): 0.3 mg via INTRAVENOUS
  Filled 2013-11-25: qty 25

## 2013-11-25 SURGICAL SUPPLY — 51 items
BLADE EXTENDED COATED 6.5IN (ELECTRODE) IMPLANT
BLADE HEX COATED 2.75 (ELECTRODE) ×6 IMPLANT
BLADE SURG SZ10 CARB STEEL (BLADE) IMPLANT
CANISTER SUCTION 2500CC (MISCELLANEOUS) IMPLANT
COUNTER NEEDLE 20 DBL MAG RED (NEEDLE) ×3 IMPLANT
COVER MAYO STAND STRL (DRAPES) ×6 IMPLANT
DRAPE LAPAROSCOPIC ABDOMINAL (DRAPES) ×3 IMPLANT
DRAPE LG THREE QUARTER DISP (DRAPES) ×6 IMPLANT
DRAPE UTILITY XL STRL (DRAPES) ×6 IMPLANT
DRAPE WARM FLUID 44X44 (DRAPE) ×3 IMPLANT
DRSG OPSITE POSTOP 3X4 (GAUZE/BANDAGES/DRESSINGS) ×3 IMPLANT
DRSG OPSITE POSTOP 4X10 (GAUZE/BANDAGES/DRESSINGS) IMPLANT
DRSG OPSITE POSTOP 4X6 (GAUZE/BANDAGES/DRESSINGS) IMPLANT
DRSG OPSITE POSTOP 4X8 (GAUZE/BANDAGES/DRESSINGS) ×3 IMPLANT
ELECT REM PT RETURN 9FT ADLT (ELECTROSURGICAL) ×3
ELECTRODE REM PT RTRN 9FT ADLT (ELECTROSURGICAL) ×1 IMPLANT
EVACUATOR DRAINAGE 10X20 100CC (DRAIN) IMPLANT
EVACUATOR SILICONE 100CC (DRAIN)
GAUZE SPONGE 4X4 12PLY STRL (GAUZE/BANDAGES/DRESSINGS) IMPLANT
GLOVE BIOGEL PI IND STRL 7.0 (GLOVE) ×2 IMPLANT
GLOVE BIOGEL PI INDICATOR 7.0 (GLOVE) ×4
GLOVE ECLIPSE 7.0 STRL STRAW (GLOVE) ×6 IMPLANT
GLOVE SURG ORTHO 8.0 STRL STRW (GLOVE) ×6 IMPLANT
GLOVE SURG SS PI 6.5 STRL IVOR (GLOVE) ×3 IMPLANT
GOWN STRL REUS W/TWL XL LVL3 (GOWN DISPOSABLE) ×12 IMPLANT
KIT BASIN OR (CUSTOM PROCEDURE TRAY) ×3 IMPLANT
LEGGING LITHOTOMY PAIR STRL (DRAPES) ×3 IMPLANT
LUBRICANT JELLY K Y 4OZ (MISCELLANEOUS) IMPLANT
PACK GENERAL/GYN (CUSTOM PROCEDURE TRAY) ×3 IMPLANT
PENCIL BUTTON HOLSTER BLD 10FT (ELECTRODE) ×3 IMPLANT
STAPLER CIRC CVD 29MM 37CM (STAPLE) ×3 IMPLANT
STAPLER VISISTAT 35W (STAPLE) ×3 IMPLANT
SUCTION POOLE TIP (SUCTIONS) ×3 IMPLANT
SUT ETHILON 3 0 PS 1 (SUTURE) IMPLANT
SUT NOV 1 T60/GS (SUTURE) IMPLANT
SUT NOVA NAB DX-16 0-1 5-0 T12 (SUTURE) ×18 IMPLANT
SUT NOVA T20/GS 25 (SUTURE) IMPLANT
SUT SILK 2 0 (SUTURE) ×2
SUT SILK 2 0 SH CR/8 (SUTURE) ×3 IMPLANT
SUT SILK 2-0 18XBRD TIE 12 (SUTURE) ×1 IMPLANT
SUT SILK 3 0 (SUTURE) ×2
SUT SILK 3 0 SH CR/8 (SUTURE) ×3 IMPLANT
SUT SILK 3-0 18XBRD TIE 12 (SUTURE) ×1 IMPLANT
SUT VIC AB 4-0 SH 18 (SUTURE) IMPLANT
TOWEL OR 17X26 10 PK STRL BLUE (TOWEL DISPOSABLE) ×6 IMPLANT
TOWEL OR NON WOVEN STRL DISP B (DISPOSABLE) ×6 IMPLANT
TRAY FOLEY CATH 14FRSI W/METER (CATHETERS) IMPLANT
TRAY FOLEY METER SIL LF 16FR (CATHETERS) ×3 IMPLANT
TUBING CONNECTING 10 (TUBING) IMPLANT
TUBING CONNECTING 10' (TUBING)
YANKAUER SUCT BULB TIP 10FT TU (MISCELLANEOUS) ×3 IMPLANT

## 2013-11-25 NOTE — Op Note (Signed)
[name] [name], [name] [name]                 ACCOUNT NO.:  [id number]  MEDICAL RECORD NO.:  [id number]  LOCATION:  [id number]                         FACILITY:  [facility]  PHYSICIAN:  Earnstine Regal, MD      DATE OF BIRTH:  04-29-1936  DATE OF PROCEDURE:  11/25/2013                               OPERATIVE REPORT   PREOPERATIVE DIAGNOSIS:  History of complicated diverticular disease, colostomy.  POSTOPERATIVE DIAGNOSIS:  History of complicated diverticular disease, colostomy.  PROCEDURE:  Colostomy closure.  SURGEON:  Armandina Gemma, MD, FACS  ANESTHESIA:  General.  ESTIMATED BLOOD LOSS:  Minimal.  PREPARATION:  ChloraPrep and Betadine.  COMPLICATIONS:  None.  INDICATIONS:  Patient is a 78 year old male, treated for complicated diverticular disease.  He developed a colocutaneous fistula and was taken to the operating room in January 2015, where he underwent segmental colectomy and colostomy.  Postoperatively, he did well.  He has now been evaluated by gastroenterology and was prepared for colostomy closure.  BODY OF REPORT:  Procedure was done in OR #1 at the [facility].  The patient was brought to the operating room, placed in supine position on the operating room table.  Following administration of general anesthesia, the patient was positioned in lithotomy and then prepped and draped in the usual aseptic fashion. After ascertaining that adequate level of anesthesia had been achieved, the midline incision was reopened with a #10 blade.  Dissection was carried through subcutaneous tissue, and previous suture material was extracted.  Peritoneal cavity was entered cautiously.  Omental adhesions to the anterior abdominal wall were taken down sharply and hemostasis achieved with the electrocautery.  Incision was extended cephalad above the level of the umbilicus.  A Balfour retractor was placed for exposure.  Limited lysis of adhesions was performed.  The colostomy was then  mobilized from the anterior abdominal wall with a circumferential incision.  It was freed from the subcutaneous tissues and from its fascial attachments and reduced back within the peritoneal cavity. Dissection in the pelvis required some lysis of adhesions of the small bowel.  The transection of the distal bowel was at the level of the peritoneal reflection.  Two Prolene sutures mark the staple line.  These are extracted.  The colostomy was then resected and submitted to Pathology for review. The proximal sigmoid colon was then prepared for anastomosis.  After sizing, a 25 mm EEA stapler was selected due to the small caliber of the bowel and the rectum.  A 2-0 Prolene pursestring suture was placed in the end of the sigmoid colon.  The anvil of the stapler was inserted and the pursestring suture tied securely.  Next, the anus was gently dilated digitally.  The EEA stapler was then inserted and advanced to the previous staple line under direct vision. Trocar was deployed, stapler was reassembled, closed, and fired.  The stapler was withdrawn without difficulty and 2 complete rings are found within the mechanism of the stapler.  Rigid sigmoidoscopy was performed and air was insufflated into the rectum with the descending colon occluded, the bowel was moderately to markedly distended with saline in the pelvis showing no evidence of air leak at the  anastomosis.  Scope was withdrawn and fluid was evacuated from the pelvis.  The bowel lays nicely across the pelvic brim and into the pelvis without tension. Good hemostasis was noted.  Abdomen was irrigated with warm saline which was evacuated.  The sponges were removed.  Retractors removed.  At this point, all of the drapes, gowns, and gloves are exchanged.  The fascial defect at the colostomy site was closed with interrupted #1 Novafil sutures.  The midline abdominal incision was then closed with interrupted #1 Novafil sutures.   Subcutaneous tissues were irrigated. The skin of both incisions were closed with stainless steel staples. Honeycomb dressings were applied.  The patient was awakened from anesthesia and brought to the recovery room.  The patient tolerated the procedure well.   Earnstine Regal, MD, [facility] Office: [phone]    TMG/MEDQ  D:  11/25/2013  T:  11/25/2013  Job:  [id number]  cc:   Lear Ng, MD Fax: [phone]

## 2013-11-25 NOTE — Interval H&P Note (Signed)
History and Physical Interval Note:  11/25/2013 11:16 AM  [name] [name]  has presented today for surgery, with the diagnosis of diverticular disease, presence of colostomy.  The various methods of treatment have been discussed with the patient and family. After consideration of risks, benefits and other options for treatment, the patient has consented to    Procedure(s): COLOSTOMY CLOSURE WITH RESECTION (N/A) as a surgical intervention .    The patient's history has been reviewed, patient examined, no change in status, stable for surgery.  I have reviewed the patient's chart and labs.  Questions were answered to the patient's satisfaction.    Earnstine Regal, MD, [facility] Office: [place]

## 2013-11-25 NOTE — Brief Op Note (Signed)
11/25/2013  1:52 PM  PATIENT:  [name] [name]  78 y.o. male  PRE-OPERATIVE DIAGNOSIS:  diverticular disease, presence of colostomy  POST-OPERATIVE DIAGNOSIS:  diverticular disease, presence of colostomy  PROCEDURE:  Procedure(s): COLOSTOMY CLOSURE (N/A)  SURGEON:  Surgeon(s) and Role:    * Earnstine Regal, MD - Primary  ANESTHESIA:   general  EBL:  Total I/O In: 1000 [I.V.:1000] Out: -   BLOOD ADMINISTERED:none  DRAINS: none   LOCAL MEDICATIONS USED:  NONE  SPECIMEN:  Excision  DISPOSITION OF SPECIMEN:  PATHOLOGY  COUNTS:  YES  TOURNIQUET:  * No tourniquets in log *  DICTATION: .Other Dictation: Dictation Number [id number]  PLAN OF CARE: Admit to inpatient   PATIENT DISPOSITION:  PACU - hemodynamically stable.   Delay start of Pharmacological VTE agent (>24hrs) due to surgical blood loss or risk of bleeding: yes  Earnstine Regal, MD, [facility] Office: [phone]

## 2013-11-25 NOTE — Progress Notes (Signed)
Patient did NOT receive pre-op entereg dose, therefore post op entereg dose was discontinued per P&T.  Dolly Rias RPh 11/25/2013, 6:12 PM Pager [phone]

## 2013-11-25 NOTE — H&P (View-Only) (Signed)
General Surgery [facility]  Chief Complaint  Patient presents with  . Follow-up    evaluate for colostomy closure    HISTORY: The patient is a 78 year old male known to my practice from complicated diverticular disease. Patient had been treated with nonoperative management of acute diverticulitis with abscess. 1 year later he developed a colocutaneous fistula. He was taken to the operating room in January 2015 and underwent segmental colectomy and colostomy. He has recovered nicely. In April 2015 he underwent colonoscopy and proctoscopy by Dr. Wilford Corner.  This showed some residual diverticulosis in the descending colon and some internal hemorrhoids, but was otherwise normal.  Patient is anxious to proceed with colostomy closure. He is currently staying in a homeless shelter. While that is adequate for the time being, he is working on more permanent housing.  PERTINENT REVIEW OF SYSTEMS: Denies any abdominal pain. Denies any obstructive symptoms.  EXAM: HEENT: normocephalic; pupils equal and reactive; sclerae clear; dentition good; mucous membranes moist NECK:  symmetric on extension; no palpable anterior or posterior cervical lymphadenopathy; no supraclavicular masses; no tenderness CHEST: clear to auscultation bilaterally without rales, rhonchi, or wheezes CARDIAC: regular rate and rhythm without significant murmur; peripheral pulses are full ABD:  Soft without distention; lower midline surgical wound well healed and completely epithelialized; colostomy is painted viable and functioning normally in the mid left abdominal wall; no tenderness EXT:  non-tender without edema; no deformity NEURO: no gross focal deficits; no sign of tremor   IMPRESSION: #1 history of complex diverticular disease with colocutaneous fistula #2 Colostomy in place  PLAN: The patient and I reviewed his colonoscopy report from gastroenterology performed in April 2015.  Patient would  like to proceed with colostomy closure in the near future. We discussed the risk and benefits of the procedure. We discussed the location of the surgical wounds. We discussed leaving the skin incisions open and allowing them to heal by secondary intention. We discussed the hospital stay to be anticipated. Patient understands and wishes to proceed in the near future.  The risks and benefits of the procedure have been discussed at length with the patient.  The patient understands the proposed procedure, potential alternative treatments, and the course of recovery to be expected.  All of the patient's questions have been answered at this time.  The patient wishes to proceed with surgery.  Earnstine Regal, MD, [facility] Office: [phone]  Visit Diagnoses: 1. Diverticulitis of sigmoid colon   2. Colostomy in place

## 2013-11-25 NOTE — Transfer of Care (Signed)
Immediate Anesthesia Transfer of Care Note  Patient: [name] [name]  Procedure(s) Performed: Procedure(s): COLOSTOMY CLOSURE (N/A)  Patient Location: PACU  Anesthesia Type:General  Level of Consciousness: awake and alert   Airway & Oxygen Therapy: Patient Spontanous Breathing and Patient connected to face mask oxygen  Post-op Assessment: Report given to PACU RN and Post -op Vital signs reviewed and stable  Post vital signs: Reviewed and stable  Complications: No apparent anesthesia complications

## 2013-11-25 NOTE — Anesthesia Preprocedure Evaluation (Signed)
Anesthesia Evaluation  Patient identified by MRN, date of birth, ID band Patient awake    Reviewed: Allergy & Precautions, H&P , NPO status , Patient's Chart, lab work & pertinent test results  Airway Mallampati: II TM Distance: >3 FB Neck ROM: Full    Dental no notable dental hx. (+) Poor Dentition   Pulmonary former smoker,  breath sounds clear to auscultation  Pulmonary exam normal       Cardiovascular hypertension, Pt. on medications + dysrhythmias Atrial Fibrillation + pacemaker Rhythm:Regular Rate:Normal     Neuro/Psych negative neurological ROS  negative psych ROS   GI/Hepatic negative GI ROS, Neg liver ROS,   Endo/Other  negative endocrine ROS  Renal/GU negative Renal ROS  negative genitourinary   Musculoskeletal negative musculoskeletal ROS (+)   Abdominal   Peds negative pediatric ROS (+)  Hematology negative hematology ROS (+)   Anesthesia Other Findings   Reproductive/Obstetrics negative OB ROS                           Anesthesia Physical Anesthesia Plan  ASA: III  Anesthesia Plan: General   Post-op Pain Management:    Induction: Intravenous  Airway Management Planned: Oral ETT  Additional Equipment:   Intra-op Plan:   Post-operative Plan: Extubation in OR  Informed Consent: I have reviewed the patients History and Physical, chart, labs and discussed the procedure including the risks, benefits and alternatives for the proposed anesthesia with the patient or authorized representative who has indicated his/her understanding and acceptance.   Dental advisory given  Plan Discussed with: CRNA  Anesthesia Plan Comments:         Anesthesia Quick Evaluation

## 2013-11-25 NOTE — Anesthesia Postprocedure Evaluation (Signed)
  Anesthesia Post-op Note  Patient: [name] [name]  Procedure(s) Performed: Procedure(s) (LRB): COLOSTOMY CLOSURE (N/A)  Patient Location: PACU  Anesthesia Type: General  Level of Consciousness: awake and alert   Airway and Oxygen Therapy: Patient Spontanous Breathing  Post-op Pain: mild  Post-op Assessment: Post-op Vital signs reviewed, Patient's Cardiovascular Status Stable, Respiratory Function Stable, Patent Airway and No signs of Nausea or vomiting  Last Vitals:  Filed Vitals:   11/25/13 1545  BP: 163/66  Pulse: 50  Temp: 36.4 C  Resp: 14    Post-op Vital Signs: stable   Complications: No apparent anesthesia complications

## 2013-11-26 LAB — CBC
HCT: 39.3 % (ref 39.0–52.0)
HEMOGLOBIN: 12.8 g/dL — AB (ref 13.0–17.0)
MCH: 28.6 pg (ref 26.0–34.0)
MCHC: 32.6 g/dL (ref 30.0–36.0)
MCV: 87.7 fL (ref 78.0–100.0)
Platelets: 217 10*3/uL (ref 150–400)
RBC: 4.48 MIL/uL (ref 4.22–5.81)
RDW: 16.3 % — ABNORMAL HIGH (ref 11.5–15.5)
WBC: 12.8 10*3/uL — AB (ref 4.0–10.5)

## 2013-11-26 LAB — BASIC METABOLIC PANEL
Anion gap: 11 (ref 5–15)
BUN: 21 mg/dL (ref 6–23)
CHLORIDE: 104 meq/L (ref 96–112)
CO2: 23 meq/L (ref 19–32)
CREATININE: 1.38 mg/dL — AB (ref 0.50–1.35)
Calcium: 8.5 mg/dL (ref 8.4–10.5)
GFR calc Af Amer: 56 mL/min — ABNORMAL LOW (ref >90)
GFR calc non Af Amer: 48 mL/min — ABNORMAL LOW (ref >90)
GLUCOSE: 168 mg/dL — AB (ref 70–99)
Potassium: 4.9 mEq/L (ref 3.7–5.3)
Sodium: 138 mEq/L (ref 137–147)

## 2013-11-26 NOTE — Progress Notes (Signed)
Patient ID: [name] [name], male   DOB: 02-Sep-1935, 78 y.o.   MRN: [id number]  [facility] - Progress Note  POD# 1  Subjective: Patient without complaints.  Pain controlled with PCA.  Has not been OOB yet.  No nausea or emesis.  Objective: Vital signs in last 24 hours: Temp:  [96.8 F (36 C)-99.6 F (37.6 C)] 98.8 F (37.1 C) (07/25 0556) Pulse Rate:  [50-60] 60 (07/25 0556) Resp:  [8-19] 18 (07/25 0556) BP: (124-236)/(63-101) 167/76 mmHg (07/25 0556) SpO2:  [97 %-100 %] 100 % (07/25 0556) Weight:  [150 lb 2 oz (68.096 kg)] 150 lb 2 oz (68.096 kg) (07/24 1004) Last BM Date:  (pta)  Intake/Output from previous day: 07/24 0701 - 07/25 0700 In: 3100 [I.V.:3100] Out: 475 [Urine:475]  Exam: HEENT - clear, not icteric Neck - soft Chest - clear bilaterally Cor - RRR, no murmur Abd - soft, mild distension; quiet; dressings dry and intact Ext - no significant edema Neuro - grossly intact, no focal deficits  Lab Results:   Recent Labs  11/25/13 1447 11/26/13 0600  WBC 10.0 12.8*  HGB 13.7 12.8*  HCT 40.9 39.3  PLT 214 217     Recent Labs  11/25/13 1447 11/26/13 0600  NA  --  138  K  --  4.9  CL  --  104  CO2  --  23  GLUCOSE  --  168*  BUN  --  21  CREATININE 1.34 1.38*  CALCIUM  --  8.5    Studies/Results: No results found.  Assessment / Plan: 1.  Status post colostomy closure  Begin clear liquid diet  Entereg  Encouraged OOB, ambulation  PCA for pain control  Earnstine Regal, MD, [facility] Office: [phone]  11/26/2013
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
## 2013-11-27 LAB — CBC
HEMATOCRIT: 36.6 % — AB (ref 39.0–52.0)
HEMOGLOBIN: 11.8 g/dL — AB (ref 13.0–17.0)
MCH: 28.6 pg (ref 26.0–34.0)
MCHC: 32.2 g/dL (ref 30.0–36.0)
MCV: 88.8 fL (ref 78.0–100.0)
Platelets: 189 10*3/uL (ref 150–400)
RBC: 4.12 MIL/uL — ABNORMAL LOW (ref 4.22–5.81)
RDW: 16.3 % — AB (ref 11.5–15.5)
WBC: 9 10*3/uL (ref 4.0–10.5)

## 2013-11-27 LAB — BASIC METABOLIC PANEL
Anion gap: 10 (ref 5–15)
BUN: 18 mg/dL (ref 6–23)
CO2: 25 mEq/L (ref 19–32)
CREATININE: 1.24 mg/dL (ref 0.50–1.35)
Calcium: 8.4 mg/dL (ref 8.4–10.5)
Chloride: 102 mEq/L (ref 96–112)
GFR calc non Af Amer: 55 mL/min — ABNORMAL LOW (ref >90)
GFR, EST AFRICAN AMERICAN: 64 mL/min — AB (ref >90)
GLUCOSE: 135 mg/dL — AB (ref 70–99)
POTASSIUM: 4.6 meq/L (ref 3.7–5.3)
Sodium: 137 mEq/L (ref 137–147)

## 2013-11-27 MED ORDER — ALUM & MAG HYDROXIDE-SIMETH 200-200-20 MG/5ML PO SUSP: 30.0000 mL | ORAL | Status: DC | PRN

## 2013-11-27 MED ORDER — PANTOPRAZOLE SODIUM 40 MG IV SOLR
40.0000 mg | Freq: Every day | INTRAVENOUS | Status: DC
  Filled 2013-11-27: qty 40

## 2013-11-27 NOTE — Progress Notes (Signed)
Patient ID: [name] [name], male   DOB: 21-Nov-1935, 78 y.o.   MRN: [id number]  [facility] - Progress Note  POD# 2  Subjective: Patient feels better this AM. No nausea or emesis.  Pain better controlled with PCA.  Foley out this AM.  Objective: Vital signs in last 24 hours: Temp:  [97.9 F (36.6 C)-100 F (37.8 C)] 97.9 F (36.6 C) (07/26 [id number]) Pulse Rate:  [48-68] 68 (07/26 0632) Resp:  [16-25] 20 (07/26 0810) BP: (125-172)/(68-85) 142/79 mmHg (07/26 0632) SpO2:  [100 %] 100 % (07/26 0810) Last BM Date:  (pta)  Intake/Output from previous day: 07/25 0701 - 07/26 0700 In: 2641.7 [P.O.:640; I.V.:2001.7] Out: 790 [Urine:790]  Exam: HEENT - clear, not icteric Neck - soft Chest - clear bilaterally Cor - RRR, no murmur Abd - soft, mild distension; wounds clear and dry and intact dressings; BS present Ext - no significant edema Neuro - grossly intact, no focal deficits  Lab Results:   Recent Labs  11/26/13 0600 11/27/13 0500  WBC 12.8* 9.0  HGB 12.8* 11.8*  HCT 39.3 36.6*  PLT 217 189     Recent Labs  11/26/13 0600 11/27/13 0500  NA 138 137  K 4.9 4.6  CL 104 102  CO2 23 25  GLUCOSE 168* 135*  BUN 21 18  CREATININE 1.38* 1.24  CALCIUM 8.5 8.4    Studies/Results: No results found.  Assessment / Plan: 1.  Status post colostomy closure  Begin clear liquid diet  Encouraged OOB, ambulation  Earnstine Regal, MD, [facility] Office: [phone]  11/27/2013

## 2013-11-28 ENCOUNTER — Encounter (HOSPITAL_COMMUNITY): Payer: Self-pay | Admitting: Surgery

## 2013-11-28 MED ORDER — ACETAMINOPHEN 325 MG PO TABS: 650.0000 mg | ORAL_TABLET | ORAL | Status: DC | PRN

## 2013-11-28 MED ORDER — HYDROCODONE-ACETAMINOPHEN 5-325 MG PO TABS
1.0000 | ORAL_TABLET | ORAL | Status: DC | PRN
  Administered 2013-11-28 – 2013-11-30 (×3): 2 via ORAL
  Filled 2013-11-28 (×3): qty 2

## 2013-11-28 NOTE — Progress Notes (Signed)
Patient ID: [name] [name], male   DOB: 03-Feb-1936, 78 y.o.   MRN: [id number]  [facility] - Progress Note  POD# 3  Subjective: Patient without complains.  Little pain.  Tolerating clear liquids.  Passing flatus.  No nausea.  Objective: Vital signs in last 24 hours: Temp:  [98.2 F (36.8 C)-98.8 F (37.1 C)] 98.2 F (36.8 C) (07/27 0530) Pulse Rate:  [60-63] 63 (07/27 0530) Resp:  [12-20] 18 (07/27 0530) BP: (125-154)/(60-70) 139/60 mmHg (07/27 0530) SpO2:  [98 %-100 %] 100 % (07/27 0530) Last BM Date:  (pta)  Intake/Output from previous day: 07/26 0701 - 07/27 0700 In: 2445 [P.O.:720; I.V.:1725] Out: 2975 [Urine:2975]  Exam: HEENT - clear, not icteric Neck - soft Chest - clear bilaterally Cor - RRR, no murmur Abd - soft without distension; BS present; wounds clear and dry and intact with dressings Ext - no significant edema Neuro - grossly intact, no focal deficits  Lab Results:   Recent Labs  11/26/13 0600 11/27/13 0500  WBC 12.8* 9.0  HGB 12.8* 11.8*  HCT 39.3 36.6*  PLT 217 189     Recent Labs  11/26/13 0600 11/27/13 0500  NA 138 137  K 4.9 4.6  CL 104 102  CO2 23 25  GLUCOSE 168* 135*  BUN 21 18  CREATININE 1.38* 1.24  CALCIUM 8.5 8.4    Studies/Results: No results found.  Assessment / Plan: 1.  Status post colostomy closure  Advance to full liquid diet  Decrease IVF  Discontinue PCA dilaudid  PO pain Rx  OOB, ambulate in halls  Earnstine Regal, MD, [facility] Office: [phone]  11/28/2013

## 2013-11-29 NOTE — Care Management Note (Signed)
    Page 1 of 1   11/29/2013     2:13:49 PM CARE MANAGEMENT NOTE 11/29/2013  Patient:  KRISHIV, ROESSLER   Account Number:  [id number]  Date Initiated:  11/29/2013  Documentation initiated by:  Sunday Spillers  Subjective/Objective Assessment:   78 yo male admitted s/p colostomy closure. PTA lived at [facility].     Action/Plan:   Return to [employer] at d/c   Anticipated DC Date:  11/30/2013   Anticipated DC Plan:  [facility]  In-house referral  Clinical Social Worker      [place]  CM consult      Choice offered to / List presented to:             Status of service:  Completed, signed off Medicare Important Message given?  YES (If response is "NO", the following Medicare IM given date fields will be blank) Date Medicare IM given:  11/29/2013 Medicare IM given by:  [facility] Date Additional Medicare IM given:   Additional Medicare IM given by:    Discharge Disposition:  HOME/SELF CARE  Per UR Regulation:  Reviewed for med. necessity/level of care/duration of stay  If discussed at [place] of Stay Meetings, dates discussed:    Comments:

## 2013-11-29 NOTE — Progress Notes (Signed)
Patient ID: [name] [name], male   DOB: 02/05/1936, 78 y.o.   MRN: [id number]  [facility] - Progress Note  POD# 4  Subjective: Patient doing well.  No complaints.  Off PCA.  Full liquid diet.  Ambulatory.  Objective: Vital signs in last 24 hours: Temp:  [98.3 F (36.8 C)-98.9 F (37.2 C)] 98.3 F (36.8 C) (07/28 [id number]) Pulse Rate:  [60] 60 (07/28 0953) Resp:  [18] 18 (07/28 0613) BP: (129-161)/(59-82) 148/62 mmHg (07/28 0953) SpO2:  [97 %-99 %] 98 % (07/28 0613) Last BM Date:  (pta)  Intake/Output from previous day: 07/27 0701 - 07/28 0700 In: 2565 [P.O.:1210; I.V.:1355] Out: 2450 [Urine:2450]  Exam: HEENT - clear, not icteric Neck - soft Chest - clear bilaterally Cor - RRR, no murmur Abd - soft, wounds clear and dry and intact; BS present Ext - no significant edema Neuro - grossly intact, no focal deficits  Lab Results:   Recent Labs  11/27/13 0500  WBC 9.0  HGB 11.8*  HCT 36.6*  PLT 189     Recent Labs  11/27/13 0500  NA 137  K 4.6  CL 102  CO2 25  GLUCOSE 135*  BUN 18  CREATININE 1.24  CALCIUM 8.4    Studies/Results: No results found.  Assessment / Plan: 1.  Status post colostomy closure  Advance to regular diet  Encouraged ambulation  Possible discharge to [facility] tomorrow afternoon  Earnstine Regal, MD, [facility] Office: [phone]  11/29/2013

## 2013-11-30 MED ORDER — HYDROCODONE-ACETAMINOPHEN 5-325 MG PO TABS: 1.0000 | ORAL_TABLET | ORAL | Status: DC | PRN

## 2013-11-30 NOTE — Plan of Care (Signed)
Problem: Discharge Progression Outcomes Goal: Staples/sutures removed Outcome: Not Applicable Date Met:  48/54/62 Will remove in office

## 2013-11-30 NOTE — Progress Notes (Signed)
Patient is alert and oriented, vital signs are stable, incision within normal limits, staple present MD stated patient will come to office in 5 days to have staples removed, patient is tolerating diet without complaints, norco adequate for pain relief, will continue to monitor Neta Mends RN 11-30-2013 15:16pm

## 2013-11-30 NOTE — Discharge Summary (Signed)
Physician Discharge Summary [facility]  Patient ID: Kani Macko MRN: [id number] DOB/AGE: December 02, 1935 78 y.o.  Admit date: 11/25/2013 Discharge date: 11/30/2013  Admission Diagnoses:  Diverticular disease, colostomy in place  Discharge Diagnoses:  Principal Problem:   Colostomy in place Active Problems:   Diverticulitis of sigmoid colon   Diverticular disease   Discharged Condition: good  Hospital Course: patient underwent colostomy closure.  Post op course uncomplicated.  Gradual resolution of ileus.  Now tolerating regular diet, ambulating, voiding.  Soft BM's.  Prepared for discharge on POD#5.  Consults: None  Treatments: surgery: colostomy closure  Discharge Exam: Blood pressure 121/60, pulse 60, temperature 97.8 F (36.6 C), temperature source Oral, resp. rate 16, height 5\' 7"  (1.702 m), weight 150 lb 2 oz (68.096 kg), SpO2 100.00%. HEENT - clear Neck - soft Chest - clear bilaterally Cor - RRR Abd - incisions clear and dry and intact - staples in place; active BS  Disposition: Home  Discharge Instructions   Diet - low sodium heart healthy    Complete by:  As directed      Discharge instructions    Complete by:  As directed   [facility]  OPEN ABDOMINAL SURGERY: POST OP INSTRUCTIONS  Always review your discharge instruction sheet given to you by the facility where your surgery was performed.  A prescription for pain medication may be given to you upon discharge.  Take your pain medication as prescribed.  If narcotic pain medicine is not needed, then you may take acetaminophen (Tylenol) or ibuprofen (Advil) as needed. Take your usually prescribed medications unless otherwise directed. If you need a refill on your pain medication, please contact your pharmacy. They will contact our office to request authorization.  Prescriptions will not be filled after 5 pm or on weekends. You should follow a light diet the first few days after  arrival home, such as soup and crackers, unless your doctor has advised otherwise. A high-fiber, low fat diet can be resumed as tolerated.  Be sure to include plenty of fluids daily.  Most patients will experience some swelling and bruising in the area of the incision. Ice packs will help. Swelling and bruising can take several days to resolve. It is common to experience some constipation if taking pain medication after surgery.  Increasing fluid intake and taking a stool softener will usually help or prevent this problem from occurring.  A mild laxative (Milk of Magnesia or Miralax) should be taken according to package directions if there are no bowel movements after 48 hours.  You may have steri-strips (small skin tapes) in place directly over the incision.  These strips should be left on the skin for 7-10 days.  If your surgeon used skin glue on the incision, you may shower in 24 hours.  The glue will flake off over the next 2-3 weeks.  Any sutures or staples will be removed at the office during your follow-up visit. You may find that a light gauze bandage over your incision may keep your staples from being rubbed or pulled. You may shower and replace the bandage daily. ACTIVITIES:  You may resume regular (light) daily activities beginning the next day-such as daily self-care, walking, climbing stairs-gradually increasing activities as tolerated.  You may have sexual intercourse when it is comfortable.  Refrain from any heavy lifting or straining until approved by your doctor.  You may drive when you no longer are taking prescription pain medication, you can comfortably wear a  seatbelt, and you can safely maneuver your car and apply brakes. You should see your doctor in the office for a follow-up appointment approximately two weeks after your surgery.  Make sure that you call for this appointment within a day or two after you arrive home to insure a convenient appointment time.  WHEN TO CALL YOUR  DOCTOR: Fever greater than 101.0 Inability to urinate Persistent nausea and/or vomiting Extreme swelling or bruising Continued bleeding from incision Increased pain, redness, or drainage from the incision Difficulty swallowing or breathing Muscle cramping or spasms Numbness or tingling in hands or around lips  IF YOU HAVE DISABILITY OR FAMILY LEAVE FORMS, YOU MUST BRING THEM TO THE OFFICE FOR PROCESSING.  PLEASE DO NOT GIVE THEM TO YOUR DOCTOR.  The clinic staff is available to answer your questions during regular business hours.  Please don't hesitate to call and ask to speak to one of the nurses if you have concerns.  [facility] [place] Office: [phone]  For further questions, please visit [email or web address]     Increase activity slowly    Complete by:  As directed      No dressing needed    Complete by:  As directed             Medication List         aspirin 81 MG chewable tablet  Chew 81 mg by mouth daily.     HYDROcodone-acetaminophen 5-325 MG per tablet  Commonly known as:  NORCO/VICODIN  Take 1-2 tablets by mouth every 4 (four) hours as needed for moderate pain.     metoprolol tartrate 25 MG tablet  Commonly known as:  LOPRESSOR  Take 25 mg by mouth 2 (two) times daily.     olmesartan-hydrochlorothiazide 40-12.5 MG per tablet  Commonly known as:  BENICAR HCT  Take 1 tablet by mouth every morning.     simvastatin 20 MG tablet  Commonly known as:  ZOCOR  Take 20 mg by mouth daily.     tamsulosin 0.4 MG Caps capsule  Commonly known as:  FLOMAX  Take 0.8 mg by mouth at bedtime.     Vitamin D (Ergocalciferol) 50000 UNITS Caps capsule  Commonly known as:  DRISDOL  Take 50,000 Units by mouth every 7 (seven) days.           Follow-up Information   Follow up with Earnstine Regal, MD. Schedule an appointment as soon as possible for a visit in 5 days. (For suture removal)    Specialty:  General Surgery   Contact information:    [address] [place] [address] [phone]       Earnstine Regal, MD, [facility] Office: [phone]   Signed: Earnstine Regal 11/30/2013, 2:25 PM

## 2013-12-01 ENCOUNTER — Telehealth (INDEPENDENT_AMBULATORY_CARE_PROVIDER_SITE_OTHER): Payer: Self-pay

## 2013-12-01 NOTE — Telephone Encounter (Signed)
Pt called and advised of appt Tuesday for staple removal.

## 2013-12-06 ENCOUNTER — Encounter (INDEPENDENT_AMBULATORY_CARE_PROVIDER_SITE_OTHER): Payer: Self-pay | Admitting: Surgery

## 2013-12-06 ENCOUNTER — Ambulatory Visit (INDEPENDENT_AMBULATORY_CARE_PROVIDER_SITE_OTHER): Payer: Medicare Other | Admitting: Surgery

## 2013-12-06 VITALS — BP 134/80 | HR 66 | Temp 97.9°F | Resp 16 | Ht 67.0 in | Wt 142.4 lb

## 2013-12-06 DIAGNOSIS — Z933 Colostomy status: Secondary | ICD-10-CM

## 2013-12-06 DIAGNOSIS — K5732 Diverticulitis of large intestine without perforation or abscess without bleeding: Secondary | ICD-10-CM

## 2013-12-06 NOTE — Progress Notes (Signed)
General Surgery [facility]  Chief Complaint  Patient presents with  . Routine Post Op    colostomy closure 11/25/2013    HISTORY: The patient is a 78 year old male who underwent colostomy closure. He returns today for staple removal from his abdominal incisions.  EXAM: Wounds have healed nicely. No sign of infection. Staples are removed and benzoin and Steri-Strips are applied.  IMPRESSION: Status post colostomy closure  PLAN: Patient is instructed in wound care. He will return for wound check in 4-6 weeks.  Earnstine Regal, MD, [facility]   Visit Diagnoses: 1. Diverticulitis of sigmoid colon   2. Colostomy in place

## 2013-12-06 NOTE — Patient Instructions (Signed)
  CARE OF INCISION   Apply cocoa butter/vitamin E cream (Palmer's brand) to your incision 2 - 3 times daily.  Massage cream into incision for one minute with each application.  Use sunscreen (50 SPF or higher) for first 6 months after surgery if area is exposed to sun.  You may alternate Mederma or other scar reducing cream with cocoa butter cream if desired.       Danis Pembleton M. Lanesha Azzaro, MD, FACS      [facility]      Office: [phone]    

## 2013-12-13 ENCOUNTER — Other Ambulatory Visit (HOSPITAL_COMMUNITY): Payer: Self-pay | Admitting: Urology

## 2013-12-13 DIAGNOSIS — C61 Malignant neoplasm of prostate: Secondary | ICD-10-CM

## 2013-12-19 ENCOUNTER — Ambulatory Visit (INDEPENDENT_AMBULATORY_CARE_PROVIDER_SITE_OTHER): Payer: Medicare Other | Admitting: Surgery

## 2013-12-19 ENCOUNTER — Encounter (INDEPENDENT_AMBULATORY_CARE_PROVIDER_SITE_OTHER): Payer: Self-pay | Admitting: Surgery

## 2013-12-19 VITALS — BP 118/68 | HR 68 | Temp 98.0°F | Resp 18 | Ht 67.0 in | Wt 143.0 lb

## 2013-12-19 DIAGNOSIS — Z933 Colostomy status: Secondary | ICD-10-CM

## 2013-12-19 DIAGNOSIS — K5732 Diverticulitis of large intestine without perforation or abscess without bleeding: Secondary | ICD-10-CM

## 2013-12-19 NOTE — Patient Instructions (Signed)
  CARE OF INCISION   Apply cocoa butter/vitamin E cream (Palmer's brand) to your incision 2 - 3 times daily.  Massage cream into incision for one minute with each application.  Use sunscreen (50 SPF or higher) for first 6 months after surgery if area is exposed to sun.  You may alternate Mederma or other scar reducing cream with cocoa butter cream if desired.       Talen Poser M. Shanyah Gattuso, MD, FACS      [facility]      Office: [phone]    

## 2013-12-19 NOTE — Progress Notes (Signed)
[facility]  Chief Complaint  Patient presents with  . Routine Post Op    colostomy closure 11/25/2013    HISTORY: Patient returns today for final wound check. He underwent colostomy closure 11/25/2013. He was in the office 10 days ago for staple removal. He returns today for final wound check.  Patient is tolerating a regular diet. He is having 2 formed bowel movements daily. He denies any abdominal pain.  EXAM: Incisions are well-healed. No sign of infection. No sign of herniation.  IMPRESSION: Status post colostomy closure  PLAN: Patient is released to full activity. I've asked him to avoid any heavy lifting for the next 3 weeks.  Patient will return for surgical care as needed.  Earnstine Regal, MD, [facility]   Visit Diagnoses: 1. Diverticulitis of sigmoid colon   2. Colostomy in place

## 2013-12-23 ENCOUNTER — Encounter (HOSPITAL_COMMUNITY)
Admission: RE | Admit: 2013-12-23 | Discharge: 2013-12-23 | Disposition: A | Discharge status: Home / Self Care | Payer: Medicare Other | Source: Ambulatory Visit | Attending: Urology | Admitting: Urology

## 2013-12-23 DIAGNOSIS — C61 Malignant neoplasm of prostate: Secondary | ICD-10-CM | POA: Diagnosis not present

## 2013-12-23 MED ORDER — TECHNETIUM TC 99M MEDRONATE IV KIT
26.0000 | PACK | Freq: Once | INTRAVENOUS | Status: AC | PRN
  Administered 2013-12-23: 26 via INTRAVENOUS

## 2014-01-27 DIAGNOSIS — Z950 Presence of cardiac pacemaker: Secondary | ICD-10-CM | POA: Diagnosis not present

## 2014-03-27 DIAGNOSIS — K5730 Diverticulosis of large intestine without perforation or abscess without bleeding: Secondary | ICD-10-CM | POA: Diagnosis not present

## 2014-03-27 DIAGNOSIS — B353 Tinea pedis: Secondary | ICD-10-CM | POA: Diagnosis not present

## 2014-03-27 DIAGNOSIS — I10 Essential (primary) hypertension: Secondary | ICD-10-CM | POA: Diagnosis not present

## 2014-03-27 DIAGNOSIS — K649 Unspecified hemorrhoids: Secondary | ICD-10-CM | POA: Diagnosis not present

## 2014-04-13 ENCOUNTER — Encounter (HOSPITAL_COMMUNITY): Payer: Self-pay | Admitting: Internal Medicine

## 2014-05-01 DIAGNOSIS — I495 Sick sinus syndrome: Secondary | ICD-10-CM | POA: Diagnosis not present

## 2014-05-01 DIAGNOSIS — Z950 Presence of cardiac pacemaker: Secondary | ICD-10-CM | POA: Diagnosis not present

## 2014-05-01 DIAGNOSIS — I471 Supraventricular tachycardia: Secondary | ICD-10-CM | POA: Diagnosis not present

## 2014-05-01 DIAGNOSIS — I10 Essential (primary) hypertension: Secondary | ICD-10-CM | POA: Diagnosis not present

## 2014-05-03 DIAGNOSIS — C61 Malignant neoplasm of prostate: Secondary | ICD-10-CM | POA: Diagnosis not present

## 2014-05-12 DIAGNOSIS — C61 Malignant neoplasm of prostate: Secondary | ICD-10-CM | POA: Diagnosis not present

## 2014-05-15 ENCOUNTER — Other Ambulatory Visit (HOSPITAL_COMMUNITY): Payer: Self-pay | Admitting: Urology

## 2014-05-15 DIAGNOSIS — C61 Malignant neoplasm of prostate: Secondary | ICD-10-CM

## 2014-05-22 ENCOUNTER — Encounter (HOSPITAL_COMMUNITY): Payer: Medicare Other

## 2014-05-25 ENCOUNTER — Encounter (HOSPITAL_COMMUNITY): Payer: Self-pay

## 2014-05-25 ENCOUNTER — Encounter (HOSPITAL_COMMUNITY)
Admission: RE | Admit: 2014-05-25 | Discharge: 2014-05-25 | Disposition: A | Discharge status: Home / Self Care | Payer: Medicare Other | Source: Ambulatory Visit | Attending: Urology | Admitting: Urology

## 2014-05-25 DIAGNOSIS — C61 Malignant neoplasm of prostate: Secondary | ICD-10-CM | POA: Diagnosis not present

## 2014-05-25 MED ORDER — TECHNETIUM TC 99M MEDRONATE IV KIT
26.2000 | PACK | Freq: Once | INTRAVENOUS | Status: AC | PRN
  Administered 2014-05-25: 26.2 via INTRAVENOUS

## 2014-05-29 DIAGNOSIS — E784 Other hyperlipidemia: Secondary | ICD-10-CM | POA: Diagnosis not present

## 2014-05-29 DIAGNOSIS — L03319 Cellulitis of trunk, unspecified: Secondary | ICD-10-CM | POA: Diagnosis not present

## 2014-05-29 DIAGNOSIS — C61 Malignant neoplasm of prostate: Secondary | ICD-10-CM | POA: Diagnosis not present

## 2014-05-29 DIAGNOSIS — I10 Essential (primary) hypertension: Secondary | ICD-10-CM | POA: Diagnosis not present

## 2014-05-29 DIAGNOSIS — Z131 Encounter for screening for diabetes mellitus: Secondary | ICD-10-CM | POA: Diagnosis not present

## 2014-06-07 ENCOUNTER — Ambulatory Visit (HOSPITAL_COMMUNITY)
Admission: RE | Admit: 2014-06-07 | Discharge: 2014-06-07 | Disposition: A | Discharge status: Home / Self Care | Payer: Medicare Other | Source: Ambulatory Visit | Attending: Urology | Admitting: Urology

## 2014-06-07 ENCOUNTER — Other Ambulatory Visit (HOSPITAL_COMMUNITY): Payer: Self-pay | Admitting: Urology

## 2014-06-07 DIAGNOSIS — C61 Malignant neoplasm of prostate: Secondary | ICD-10-CM

## 2014-06-12 DIAGNOSIS — C7951 Secondary malignant neoplasm of bone: Secondary | ICD-10-CM | POA: Diagnosis not present

## 2014-06-12 DIAGNOSIS — C61 Malignant neoplasm of prostate: Secondary | ICD-10-CM | POA: Diagnosis not present

## 2014-07-06 IMAGING — CT CT HEAD W/O CM
1 series · 16 of 30 positions shown, 20 images · non-contrast
Comparison: None.

CLINICAL DATA: Dizziness with subsequent fall.

CT HEAD WITHOUT CONTRAST
TECHNIQUE: Contiguous axial images were obtained from the base of
the skull through the vertex without contrast.

[Series 2: head routine 4.8 h37s · axial · 0.44mm/px · z∈[+200,+352]mm · 16 of 36 slices shown, 20 images]
[im 2/36  brain]
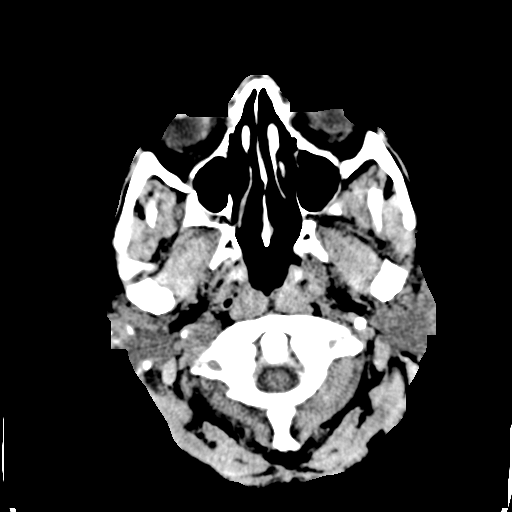
[im 2/36  bone]
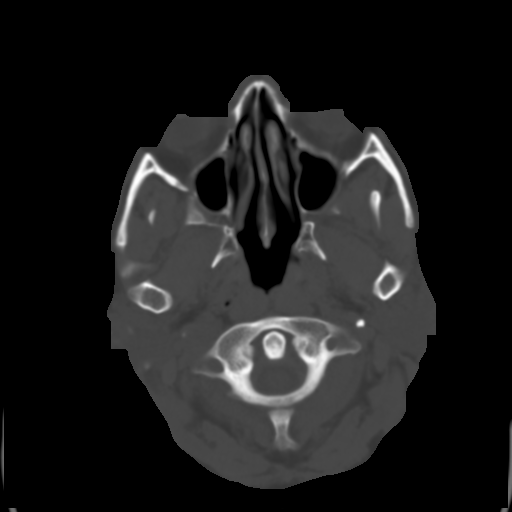
[im 4/36  brain]
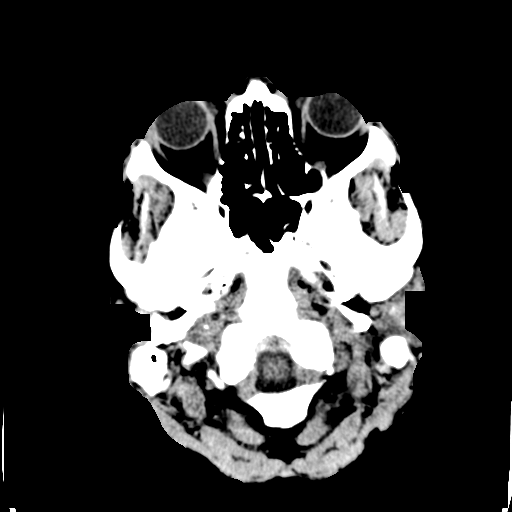
[im 7/36  brain]
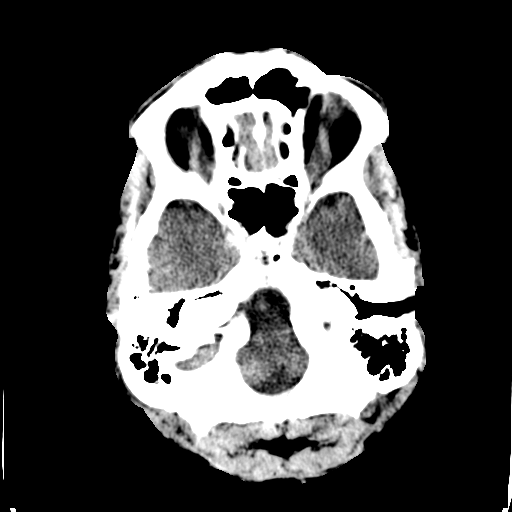
[im 9/36  brain]
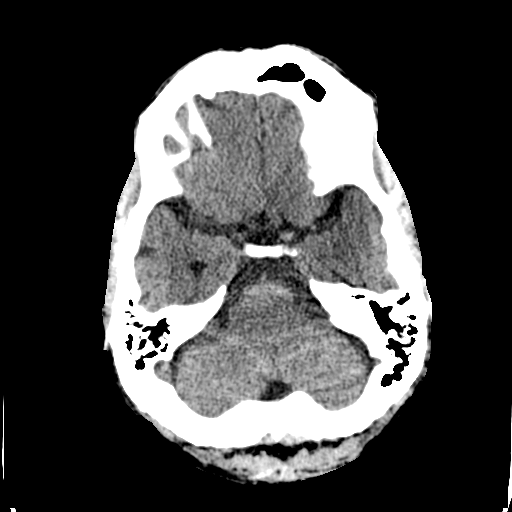
[im 10/36  brain]
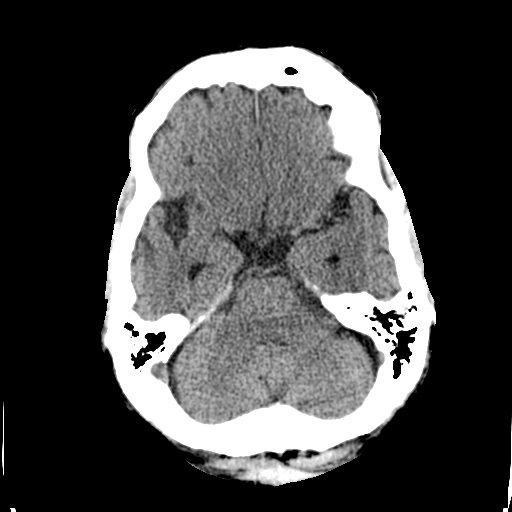
[im 10/36  bone]
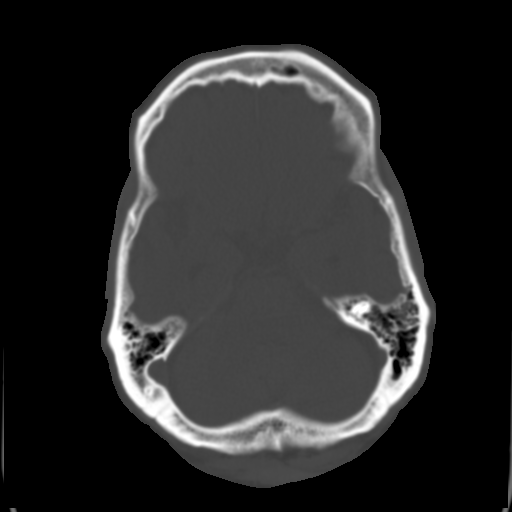
[im 13/36  brain]
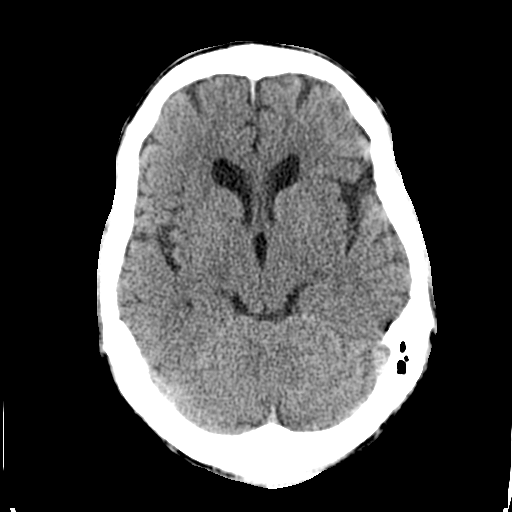
[im 15/36  brain]
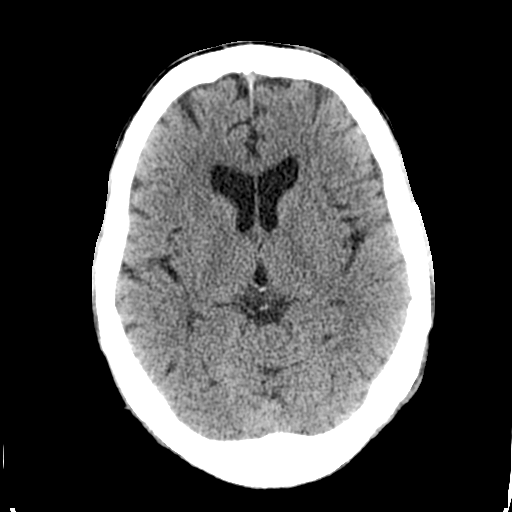
[im 17/36  brain]
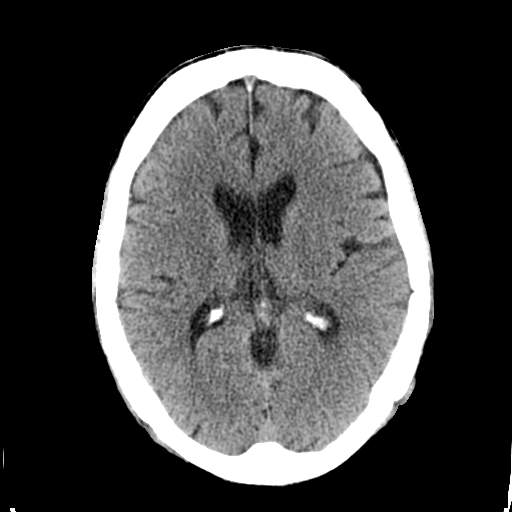
[im 19/36  brain]
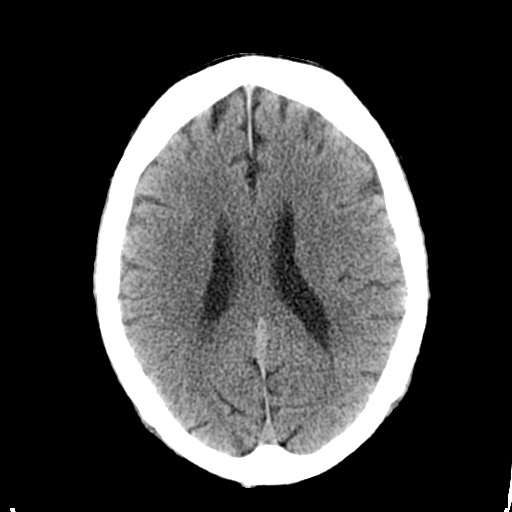
[im 19/36  bone]
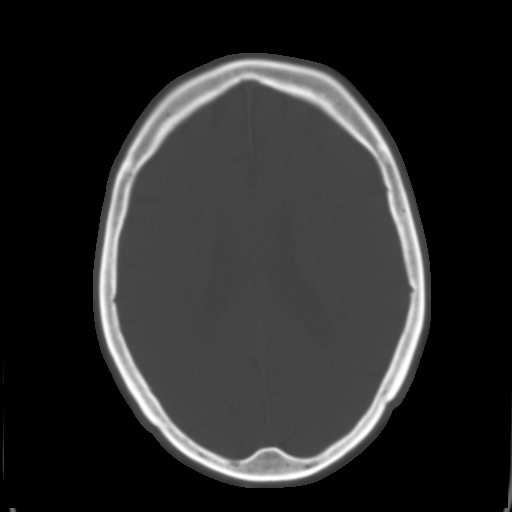
[im 21/36  brain]
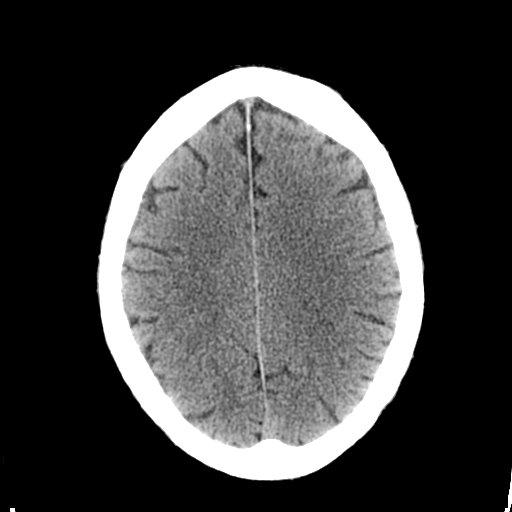
[im 23/36  brain]
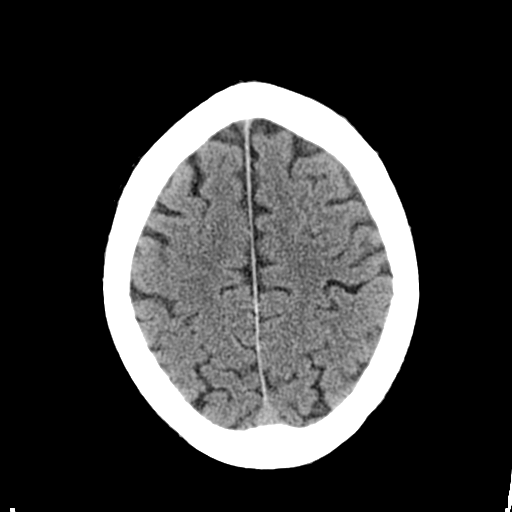
[im 26/36  brain]
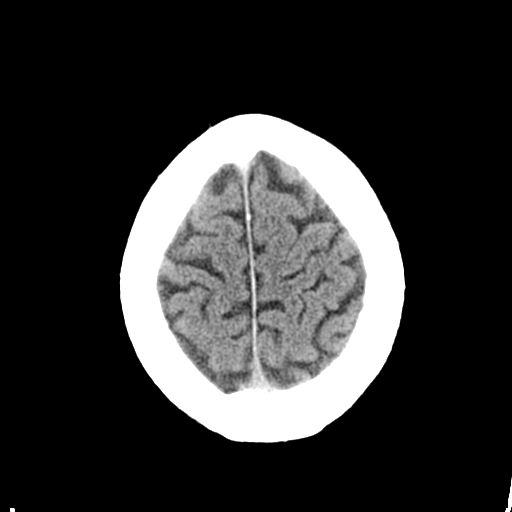
[im 27/36  brain]
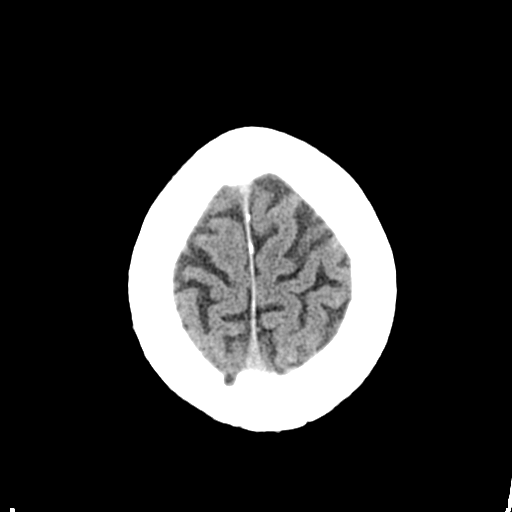
[im 27/36  bone]
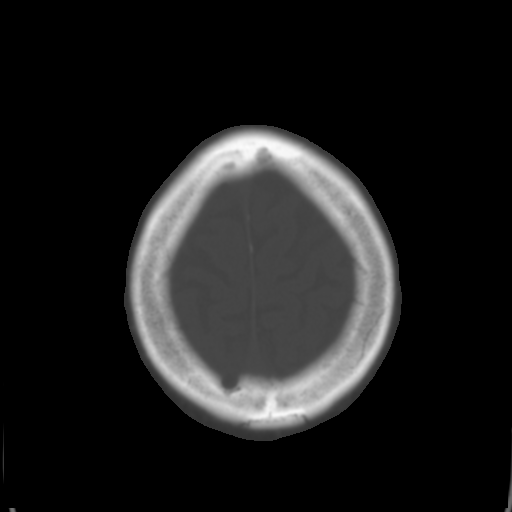
[im 29/36  brain]
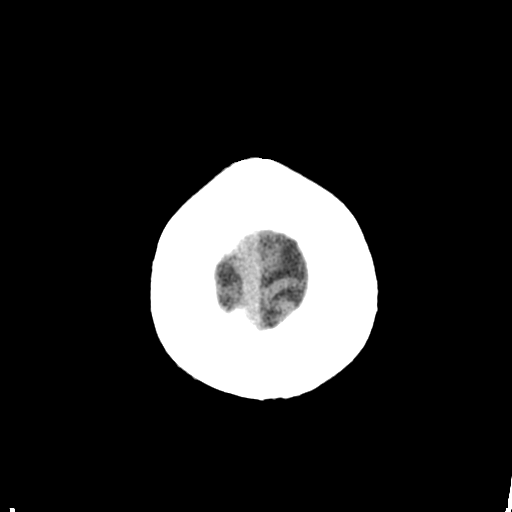
[im 32/36  brain]
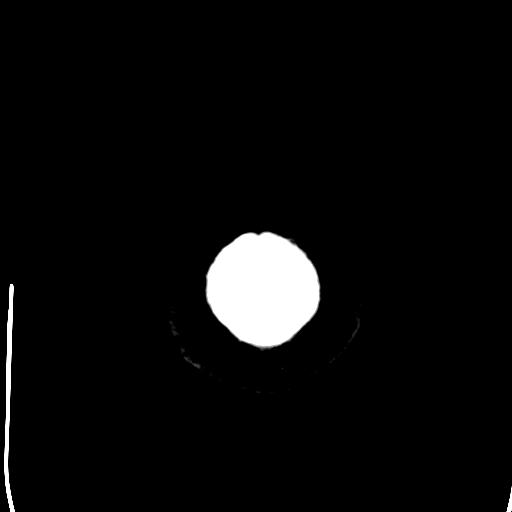
[im 34/36  brain]
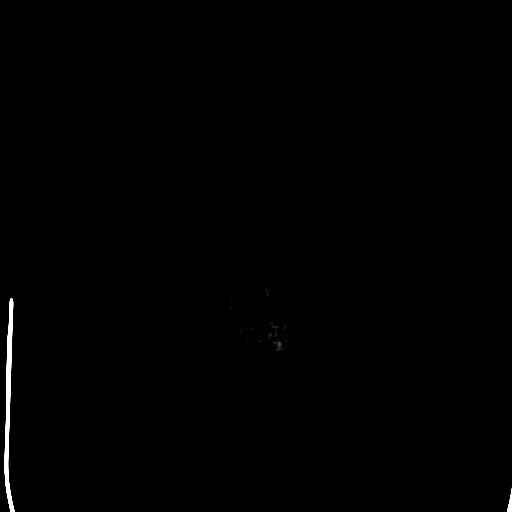

[16 of 30 positions shown; findings below may reference images not displayed]

FINDINGS: Diffuse cerebral atrophy.  No mass effect or midline
shift.  No abnormal extra-axial fluid collections.  Gray-white
matter junctions are distinct.  Basal cisterns are not effaced.
Visualized paranasal sinuses and mastoid air cells are not
opacified.  No depressed skull fractures.
IMPRESSION: No acute intracranial abnormalities.

## 2014-08-02 DIAGNOSIS — Z950 Presence of cardiac pacemaker: Secondary | ICD-10-CM | POA: Diagnosis not present

## 2014-08-18 DIAGNOSIS — Z950 Presence of cardiac pacemaker: Secondary | ICD-10-CM | POA: Diagnosis not present

## 2014-08-18 DIAGNOSIS — I10 Essential (primary) hypertension: Secondary | ICD-10-CM | POA: Diagnosis not present

## 2014-08-18 DIAGNOSIS — I480 Paroxysmal atrial fibrillation: Secondary | ICD-10-CM | POA: Diagnosis not present

## 2014-08-18 DIAGNOSIS — I495 Sick sinus syndrome: Secondary | ICD-10-CM | POA: Diagnosis not present

## 2014-09-01 DIAGNOSIS — I480 Paroxysmal atrial fibrillation: Secondary | ICD-10-CM | POA: Diagnosis not present

## 2014-09-01 DIAGNOSIS — I495 Sick sinus syndrome: Secondary | ICD-10-CM | POA: Diagnosis not present

## 2014-10-09 DIAGNOSIS — I10 Essential (primary) hypertension: Secondary | ICD-10-CM | POA: Diagnosis not present

## 2014-10-09 DIAGNOSIS — C61 Malignant neoplasm of prostate: Secondary | ICD-10-CM | POA: Diagnosis not present

## 2014-10-09 DIAGNOSIS — E784 Other hyperlipidemia: Secondary | ICD-10-CM | POA: Diagnosis not present

## 2014-10-09 DIAGNOSIS — I4891 Unspecified atrial fibrillation: Secondary | ICD-10-CM | POA: Diagnosis not present

## 2014-10-09 DIAGNOSIS — J984 Other disorders of lung: Secondary | ICD-10-CM | POA: Diagnosis not present

## 2014-10-27 DIAGNOSIS — C61 Malignant neoplasm of prostate: Secondary | ICD-10-CM | POA: Diagnosis not present

## 2014-10-27 DIAGNOSIS — C7951 Secondary malignant neoplasm of bone: Secondary | ICD-10-CM | POA: Diagnosis not present

## 2014-11-01 DIAGNOSIS — Z950 Presence of cardiac pacemaker: Secondary | ICD-10-CM | POA: Diagnosis not present

## 2014-12-28 DIAGNOSIS — E784 Other hyperlipidemia: Secondary | ICD-10-CM | POA: Diagnosis not present

## 2014-12-28 DIAGNOSIS — H1033 Unspecified acute conjunctivitis, bilateral: Secondary | ICD-10-CM | POA: Diagnosis not present

## 2014-12-28 DIAGNOSIS — I4891 Unspecified atrial fibrillation: Secondary | ICD-10-CM | POA: Diagnosis not present

## 2014-12-28 DIAGNOSIS — I10 Essential (primary) hypertension: Secondary | ICD-10-CM | POA: Diagnosis not present

## 2014-12-28 DIAGNOSIS — C61 Malignant neoplasm of prostate: Secondary | ICD-10-CM | POA: Diagnosis not present

## 2015-01-31 DIAGNOSIS — Z950 Presence of cardiac pacemaker: Secondary | ICD-10-CM | POA: Diagnosis not present

## 2015-03-06 DIAGNOSIS — Z950 Presence of cardiac pacemaker: Secondary | ICD-10-CM | POA: Diagnosis not present

## 2015-03-06 DIAGNOSIS — I10 Essential (primary) hypertension: Secondary | ICD-10-CM | POA: Diagnosis not present

## 2015-03-06 DIAGNOSIS — I495 Sick sinus syndrome: Secondary | ICD-10-CM | POA: Diagnosis not present

## 2015-03-06 DIAGNOSIS — I480 Paroxysmal atrial fibrillation: Secondary | ICD-10-CM | POA: Diagnosis not present

## 2015-03-06 DIAGNOSIS — E559 Vitamin D deficiency, unspecified: Secondary | ICD-10-CM | POA: Diagnosis not present

## 2015-03-09 DIAGNOSIS — C61 Malignant neoplasm of prostate: Secondary | ICD-10-CM | POA: Diagnosis not present

## 2015-03-09 DIAGNOSIS — R351 Nocturia: Secondary | ICD-10-CM | POA: Diagnosis not present

## 2015-04-09 DIAGNOSIS — E784 Other hyperlipidemia: Secondary | ICD-10-CM | POA: Diagnosis not present

## 2015-04-09 DIAGNOSIS — C61 Malignant neoplasm of prostate: Secondary | ICD-10-CM | POA: Diagnosis not present

## 2015-04-09 DIAGNOSIS — I10 Essential (primary) hypertension: Secondary | ICD-10-CM | POA: Diagnosis not present

## 2015-04-09 DIAGNOSIS — I4891 Unspecified atrial fibrillation: Secondary | ICD-10-CM | POA: Diagnosis not present

## 2015-05-02 DIAGNOSIS — Z950 Presence of cardiac pacemaker: Secondary | ICD-10-CM | POA: Diagnosis not present

## 2015-07-02 DIAGNOSIS — C61 Malignant neoplasm of prostate: Secondary | ICD-10-CM | POA: Diagnosis not present

## 2015-07-09 DIAGNOSIS — C61 Malignant neoplasm of prostate: Secondary | ICD-10-CM | POA: Diagnosis not present

## 2015-07-09 DIAGNOSIS — Z0000 Encounter for general adult medical examination without abnormal findings: Secondary | ICD-10-CM | POA: Diagnosis not present

## 2015-07-09 DIAGNOSIS — C7951 Secondary malignant neoplasm of bone: Secondary | ICD-10-CM | POA: Diagnosis not present

## 2015-07-30 DIAGNOSIS — I10 Essential (primary) hypertension: Secondary | ICD-10-CM | POA: Diagnosis not present

## 2015-07-30 DIAGNOSIS — E559 Vitamin D deficiency, unspecified: Secondary | ICD-10-CM | POA: Diagnosis not present

## 2015-07-30 DIAGNOSIS — C61 Malignant neoplasm of prostate: Secondary | ICD-10-CM | POA: Diagnosis not present

## 2015-07-30 DIAGNOSIS — L600 Ingrowing nail: Secondary | ICD-10-CM | POA: Diagnosis not present

## 2015-07-30 DIAGNOSIS — I4891 Unspecified atrial fibrillation: Secondary | ICD-10-CM | POA: Diagnosis not present

## 2015-07-30 DIAGNOSIS — Z131 Encounter for screening for diabetes mellitus: Secondary | ICD-10-CM | POA: Diagnosis not present

## 2015-07-30 DIAGNOSIS — E784 Other hyperlipidemia: Secondary | ICD-10-CM | POA: Diagnosis not present

## 2015-09-18 DIAGNOSIS — Z950 Presence of cardiac pacemaker: Secondary | ICD-10-CM | POA: Diagnosis not present

## 2015-09-24 ENCOUNTER — Encounter: Payer: Self-pay | Admitting: Sports Medicine

## 2015-09-24 ENCOUNTER — Ambulatory Visit (INDEPENDENT_AMBULATORY_CARE_PROVIDER_SITE_OTHER): Payer: Medicare Other | Admitting: Sports Medicine

## 2015-09-24 VITALS — BP 144/78 | HR 69 | Resp 12

## 2015-09-24 DIAGNOSIS — M79675 Pain in left toe(s): Secondary | ICD-10-CM

## 2015-09-24 DIAGNOSIS — R61 Generalized hyperhidrosis: Secondary | ICD-10-CM

## 2015-09-24 DIAGNOSIS — M79674 Pain in right toe(s): Secondary | ICD-10-CM

## 2015-09-24 DIAGNOSIS — B351 Tinea unguium: Secondary | ICD-10-CM | POA: Diagnosis not present

## 2015-09-24 DIAGNOSIS — L601 Onycholysis: Secondary | ICD-10-CM | POA: Diagnosis not present

## 2015-09-24 DIAGNOSIS — L74519 Primary focal hyperhidrosis, unspecified: Secondary | ICD-10-CM | POA: Diagnosis not present

## 2015-09-24 DIAGNOSIS — B353 Tinea pedis: Secondary | ICD-10-CM

## 2015-09-24 DIAGNOSIS — L603 Nail dystrophy: Secondary | ICD-10-CM | POA: Diagnosis not present

## 2015-09-24 MED ORDER — GENTIAN VIOLET 1 % EX SOLN: 0.5000 mL | Freq: Once | CUTANEOUS | Status: DC

## 2015-09-24 MED ORDER — TERBINAFINE HCL 1 % EX CREA: 1.0000 "application " | TOPICAL_CREAM | Freq: Two times a day (BID) | CUTANEOUS | Status: DC

## 2015-09-24 NOTE — Progress Notes (Signed)
Patient ID: [name] [name], male   DOB: Dec 19, 1935, 80 y.o.   MRN: [id number] Subjective: [name] [name] is a 80 y.o. male patient seen today in office with complaint of painful thickened and discolored nails L>R big toenail. Patient is desiring treatment for nail changes; has tried OTC topicals in the past with no improvement. Reports that nails are becoming difficult to manage because of the thickness. Patient also desires medication for sweaty feet and for athletes feet; Patient has no other pedal complaints at this time.   Patient Active Problem List   Diagnosis Date Noted  . Diverticular disease 05/13/2013  . Renal insufficiency 12/31/2011  . Dyslipidemia 12/30/2011  . Carcinoma of prostate ([place]) 12/30/2011  . Pacemaker-St.Jude 12/03/2011  . Sick sinus syndrome ([place]) 11/28/2011  . Syncope and collapse 11/20/2011  . Atrial flutter with controlled response ([place]) 06/16/2011  . Essential hypertension, benign 06/16/2011    Current Outpatient Prescriptions on File Prior to Visit  Medication Sig Dispense Refill  . aspirin 81 MG chewable tablet Chew 81 mg by mouth daily.    Marland Kitchen HYDROcodone-acetaminophen (NORCO/VICODIN) 5-325 MG per tablet Take 1-2 tablets by mouth every 4 (four) hours as needed for moderate pain. 30 tablet 0  . metoprolol tartrate (LOPRESSOR) 25 MG tablet Take 25 mg by mouth 2 (two) times daily.     Marland Kitchen olmesartan-hydrochlorothiazide (BENICAR HCT) 40-12.5 MG per tablet Take 1 tablet by mouth every morning.     . simvastatin (ZOCOR) 20 MG tablet Take 20 mg by mouth daily.     . Tamsulosin HCl (FLOMAX) 0.4 MG CAPS Take 0.8 mg by mouth at bedtime.     . Vitamin D, Ergocalciferol, (DRISDOL) 50000 UNITS CAPS capsule Take 50,000 Units by mouth every 7 (seven) days.     No current facility-administered medications on file prior to visit.    No Known Allergies  Objective: Physical Exam  General: Well developed, nourished, no acute distress, awake, alert and oriented x 3  Vascular:  Dorsalis pedis artery 2/4 bilateral, Posterior tibial artery 1/4 bilateral, skin temperature warm to warm proximal to distal bilateral lower extremities, no varicosities, pedal hair present bilateral. Subjective hydrosis bilateral.   Neurological: Gross sensation present via light touch bilateral.   Dermatological: Skin is warm, dry, and supple bilateral, Nails 1-10 are tender, short thick, and discolored with mild subungal debris with L>R big toenails most involved, no webspace macerations present bilateral, no open lesions present bilateral, no callus/corns/hyperkeratotic tissue present bilateral. + scaly skin in annular fashion bilateral. No acute signs of infection bilateral.  Musculoskeletal: No symptomatic boney deformities noted bilateral. Muscular strength within normal limits without painon range of motion. No pain with calf compression bilateral.  Assessment and Plan:  Problem List Items Addressed This Visit    None    Visit Diagnoses    Dermatophytosis of nail    -  Primary    Relevant Medications    gentian violet (GNP GENTIAN VIOLET) 1 % topical solution    terbinafine (LAMISIL AT) 1 % cream    Other Relevant Orders    Fungus Culture with Smear    Toe pain, bilateral        Hyperhydrosis disorder        Relevant Medications    gentian violet (GNP GENTIAN VIOLET) 1 % topical solution    terbinafine (LAMISIL AT) 1 % cream    Tinea pedis of both feet        Relevant Medications    gentian violet (GNP GENTIAN  VIOLET) 1 % topical solution    terbinafine (LAMISIL AT) 1 % cream       -Examined patient -Discussed treatment options for painful dystrophic nails  -Fungal culture bilateral hallux nails were obtained and sent to [facility] lab -Rx Gentian violet to use on moist skin areas and lamisil cream daily for tinea -Patient to return in 4 weeks for follow up evaluation and discussion of fungal culture results or sooner if symptoms worsen.  [name] [name], DPM

## 2015-10-29 DIAGNOSIS — C61 Malignant neoplasm of prostate: Secondary | ICD-10-CM | POA: Diagnosis not present

## 2015-10-29 DIAGNOSIS — N390 Urinary tract infection, site not specified: Secondary | ICD-10-CM | POA: Diagnosis not present

## 2015-10-29 DIAGNOSIS — I4891 Unspecified atrial fibrillation: Secondary | ICD-10-CM | POA: Diagnosis not present

## 2015-10-29 DIAGNOSIS — N3281 Overactive bladder: Secondary | ICD-10-CM | POA: Diagnosis not present

## 2015-10-29 DIAGNOSIS — I10 Essential (primary) hypertension: Secondary | ICD-10-CM | POA: Diagnosis not present

## 2015-11-05 ENCOUNTER — Encounter: Payer: Self-pay | Admitting: Sports Medicine

## 2015-11-15 DIAGNOSIS — C61 Malignant neoplasm of prostate: Secondary | ICD-10-CM | POA: Diagnosis not present

## 2015-11-22 DIAGNOSIS — R351 Nocturia: Secondary | ICD-10-CM | POA: Diagnosis not present

## 2015-11-22 DIAGNOSIS — C61 Malignant neoplasm of prostate: Secondary | ICD-10-CM | POA: Diagnosis not present

## 2016-01-28 DIAGNOSIS — E784 Other hyperlipidemia: Secondary | ICD-10-CM | POA: Diagnosis not present

## 2016-01-28 DIAGNOSIS — Z23 Encounter for immunization: Secondary | ICD-10-CM | POA: Diagnosis not present

## 2016-01-28 DIAGNOSIS — I4891 Unspecified atrial fibrillation: Secondary | ICD-10-CM | POA: Diagnosis not present

## 2016-01-28 DIAGNOSIS — I10 Essential (primary) hypertension: Secondary | ICD-10-CM | POA: Diagnosis not present

## 2016-01-30 DIAGNOSIS — Z950 Presence of cardiac pacemaker: Secondary | ICD-10-CM | POA: Diagnosis not present

## 2016-02-25 DIAGNOSIS — H35033 Hypertensive retinopathy, bilateral: Secondary | ICD-10-CM | POA: Diagnosis not present

## 2016-02-25 DIAGNOSIS — H2513 Age-related nuclear cataract, bilateral: Secondary | ICD-10-CM | POA: Diagnosis not present

## 2016-03-10 DIAGNOSIS — I495 Sick sinus syndrome: Secondary | ICD-10-CM | POA: Diagnosis not present

## 2016-03-10 DIAGNOSIS — Z950 Presence of cardiac pacemaker: Secondary | ICD-10-CM | POA: Diagnosis not present

## 2016-03-10 DIAGNOSIS — I10 Essential (primary) hypertension: Secondary | ICD-10-CM | POA: Diagnosis not present

## 2016-03-10 DIAGNOSIS — I480 Paroxysmal atrial fibrillation: Secondary | ICD-10-CM | POA: Diagnosis not present

## 2016-03-21 DIAGNOSIS — C61 Malignant neoplasm of prostate: Secondary | ICD-10-CM | POA: Diagnosis not present

## 2016-04-01 DIAGNOSIS — E7800 Pure hypercholesterolemia, unspecified: Secondary | ICD-10-CM | POA: Diagnosis not present

## 2016-04-01 DIAGNOSIS — I10 Essential (primary) hypertension: Secondary | ICD-10-CM | POA: Diagnosis not present

## 2016-04-01 DIAGNOSIS — I480 Paroxysmal atrial fibrillation: Secondary | ICD-10-CM | POA: Diagnosis not present

## 2017-01-26 ENCOUNTER — Other Ambulatory Visit: Payer: Self-pay | Admitting: Urology

## 2017-01-26 DIAGNOSIS — C61 Malignant neoplasm of prostate: Secondary | ICD-10-CM

## 2017-02-10 ENCOUNTER — Encounter (HOSPITAL_COMMUNITY)
Admission: RE | Admit: 2017-02-10 | Discharge: 2017-02-10 | Disposition: A | Discharge status: Home / Self Care | Payer: Medicare Other | Source: Ambulatory Visit | Attending: Urology | Admitting: Urology

## 2017-02-10 ENCOUNTER — Ambulatory Visit (HOSPITAL_COMMUNITY)
Admission: RE | Admit: 2017-02-10 | Discharge: 2017-02-10 | Disposition: A | Discharge status: Home / Self Care | Payer: Medicare Other | Source: Ambulatory Visit | Attending: Urology | Admitting: Urology

## 2017-02-10 DIAGNOSIS — C61 Malignant neoplasm of prostate: Secondary | ICD-10-CM | POA: Insufficient documentation

## 2017-02-10 MED ORDER — TECHNETIUM TC 99M MEDRONATE IV KIT
21.6000 | PACK | Freq: Once | INTRAVENOUS | Status: AC | PRN
  Administered 2017-02-10: 21.6 via INTRAVENOUS

## 2018-06-07 ENCOUNTER — Encounter (HOSPITAL_COMMUNITY): Payer: Self-pay

## 2018-06-07 ENCOUNTER — Observation Stay (HOSPITAL_COMMUNITY): Payer: Medicare Other

## 2018-06-07 ENCOUNTER — Other Ambulatory Visit: Payer: Self-pay

## 2018-06-07 ENCOUNTER — Observation Stay (HOSPITAL_COMMUNITY)
Admission: EM | Admit: 2018-06-07 | Discharge: 2018-06-09 | Disposition: A | Discharge status: Home / Self Care | Payer: Medicare Other | Attending: Internal Medicine | Admitting: Internal Medicine

## 2018-06-07 DIAGNOSIS — R000 Tachycardia, unspecified: Secondary | ICD-10-CM | POA: Insufficient documentation

## 2018-06-07 DIAGNOSIS — R55 Syncope and collapse: Secondary | ICD-10-CM | POA: Diagnosis not present

## 2018-06-07 DIAGNOSIS — Z87891 Personal history of nicotine dependence: Secondary | ICD-10-CM | POA: Diagnosis not present

## 2018-06-07 DIAGNOSIS — N189 Chronic kidney disease, unspecified: Secondary | ICD-10-CM | POA: Insufficient documentation

## 2018-06-07 DIAGNOSIS — I491 Atrial premature depolarization: Secondary | ICD-10-CM | POA: Insufficient documentation

## 2018-06-07 DIAGNOSIS — N179 Acute kidney failure, unspecified: Secondary | ICD-10-CM | POA: Diagnosis not present

## 2018-06-07 DIAGNOSIS — C61 Malignant neoplasm of prostate: Secondary | ICD-10-CM | POA: Insufficient documentation

## 2018-06-07 DIAGNOSIS — E785 Hyperlipidemia, unspecified: Secondary | ICD-10-CM | POA: Insufficient documentation

## 2018-06-07 DIAGNOSIS — Z7982 Long term (current) use of aspirin: Secondary | ICD-10-CM | POA: Diagnosis not present

## 2018-06-07 DIAGNOSIS — I951 Orthostatic hypotension: Secondary | ICD-10-CM | POA: Diagnosis not present

## 2018-06-07 DIAGNOSIS — Z950 Presence of cardiac pacemaker: Secondary | ICD-10-CM | POA: Diagnosis not present

## 2018-06-07 DIAGNOSIS — I4892 Unspecified atrial flutter: Secondary | ICD-10-CM | POA: Diagnosis not present

## 2018-06-07 DIAGNOSIS — Z79899 Other long term (current) drug therapy: Secondary | ICD-10-CM | POA: Diagnosis not present

## 2018-06-07 DIAGNOSIS — Z7901 Long term (current) use of anticoagulants: Secondary | ICD-10-CM | POA: Insufficient documentation

## 2018-06-07 DIAGNOSIS — R262 Difficulty in walking, not elsewhere classified: Secondary | ICD-10-CM | POA: Insufficient documentation

## 2018-06-07 DIAGNOSIS — I129 Hypertensive chronic kidney disease with stage 1 through stage 4 chronic kidney disease, or unspecified chronic kidney disease: Secondary | ICD-10-CM | POA: Insufficient documentation

## 2018-06-07 DIAGNOSIS — I358 Other nonrheumatic aortic valve disorders: Secondary | ICD-10-CM | POA: Insufficient documentation

## 2018-06-07 DIAGNOSIS — E869 Volume depletion, unspecified: Secondary | ICD-10-CM | POA: Diagnosis not present

## 2018-06-07 DIAGNOSIS — Z9181 History of falling: Secondary | ICD-10-CM | POA: Insufficient documentation

## 2018-06-07 DIAGNOSIS — E079 Disorder of thyroid, unspecified: Secondary | ICD-10-CM | POA: Insufficient documentation

## 2018-06-07 HISTORY — DX: Syncope and collapse: R55

## 2018-06-07 LAB — URINALYSIS, COMPLETE (UACMP) WITH MICROSCOPIC
Bacteria, UA: NONE SEEN
Bilirubin Urine: NEGATIVE
Glucose, UA: 50 mg/dL — AB
Hgb urine dipstick: NEGATIVE
Ketones, ur: NEGATIVE mg/dL
Leukocytes, UA: NEGATIVE
Nitrite: NEGATIVE
Protein, ur: NEGATIVE mg/dL
Specific Gravity, Urine: 1.015 (ref 1.005–1.030)
pH: 5 (ref 5.0–8.0)

## 2018-06-07 LAB — CBC WITH DIFFERENTIAL/PLATELET
Abs Immature Granulocytes: 0.03 10*3/uL (ref 0.00–0.07)
BASOS ABS: 0 10*3/uL (ref 0.0–0.1)
Basophils Relative: 0 %
EOS ABS: 0.1 10*3/uL (ref 0.0–0.5)
Eosinophils Relative: 1 %
HCT: 41.4 % (ref 39.0–52.0)
HEMOGLOBIN: 13.2 g/dL (ref 13.0–17.0)
Immature Granulocytes: 1 %
LYMPHS PCT: 32 %
Lymphs Abs: 2.1 10*3/uL (ref 0.7–4.0)
MCH: 30.2 pg (ref 26.0–34.0)
MCHC: 31.9 g/dL (ref 30.0–36.0)
MCV: 94.7 fL (ref 80.0–100.0)
MONO ABS: 0.7 10*3/uL (ref 0.1–1.0)
MONOS PCT: 11 %
Neutro Abs: 3.6 10*3/uL (ref 1.7–7.7)
Neutrophils Relative %: 55 %
Platelets: 222 10*3/uL (ref 150–400)
RBC: 4.37 MIL/uL (ref 4.22–5.81)
RDW: 13.6 % (ref 11.5–15.5)
WBC: 6.6 10*3/uL (ref 4.0–10.5)
nRBC: 0 % (ref 0.0–0.2)

## 2018-06-07 LAB — URINALYSIS, ROUTINE W REFLEX MICROSCOPIC
Bilirubin Urine: NEGATIVE
GLUCOSE, UA: NEGATIVE mg/dL
Hgb urine dipstick: NEGATIVE
Ketones, ur: NEGATIVE mg/dL
Leukocytes, UA: NEGATIVE
Nitrite: NEGATIVE
PROTEIN: NEGATIVE mg/dL
Specific Gravity, Urine: 1.018 (ref 1.005–1.030)
pH: 5 (ref 5.0–8.0)

## 2018-06-07 LAB — BASIC METABOLIC PANEL
Anion gap: 13 (ref 5–15)
BUN: 60 mg/dL — AB (ref 8–23)
CALCIUM: 9.2 mg/dL (ref 8.9–10.3)
CHLORIDE: 109 mmol/L (ref 98–111)
CO2: 19 mmol/L — ABNORMAL LOW (ref 22–32)
Creatinine, Ser: 2.96 mg/dL — ABNORMAL HIGH (ref 0.61–1.24)
GFR calc non Af Amer: 19 mL/min — ABNORMAL LOW (ref >60)
GFR, EST AFRICAN AMERICAN: 22 mL/min — AB (ref >60)
GLUCOSE: 117 mg/dL — AB (ref 70–99)
Potassium: 3.9 mmol/L (ref 3.5–5.1)
Sodium: 141 mmol/L (ref 135–145)

## 2018-06-07 LAB — INFLUENZA PANEL BY PCR (TYPE A & B)
Influenza A By PCR: NEGATIVE
Influenza B By PCR: NEGATIVE

## 2018-06-07 LAB — MAGNESIUM: Magnesium: 1.9 mg/dL (ref 1.7–2.4)

## 2018-06-07 LAB — TROPONIN I
Troponin I: <0.03 ng/mL (ref <0.03)
Troponin I: <0.03 ng/mL (ref <0.03)

## 2018-06-07 LAB — PHOSPHORUS: Phosphorus: 3.3 mg/dL (ref 2.5–4.6)

## 2018-06-07 MED ORDER — METOPROLOL TARTRATE 25 MG PO TABS: 25.0000 mg | ORAL_TABLET | Freq: Two times a day (BID) | ORAL | Status: DC

## 2018-06-07 MED ORDER — HYDROCODONE-ACETAMINOPHEN 5-325 MG PO TABS: 1.0000 | ORAL_TABLET | ORAL | Status: DC | PRN

## 2018-06-07 MED ORDER — TAMSULOSIN HCL 0.4 MG PO CAPS
0.8000 mg | ORAL_CAPSULE | Freq: Every day | ORAL | Status: DC
  Administered 2018-06-07 – 2018-06-08 (×2): 0.8 mg via ORAL
  Filled 2018-06-07 (×2): qty 2

## 2018-06-07 MED ORDER — SODIUM CHLORIDE 0.9 % IV BOLUS
1000.0000 mL | Freq: Once | INTRAVENOUS | Status: AC
  Administered 2018-06-07: 1000 mL via INTRAVENOUS

## 2018-06-07 MED ORDER — DILTIAZEM HCL 60 MG PO TABS
30.0000 mg | ORAL_TABLET | Freq: Three times a day (TID) | ORAL | Status: DC
  Filled 2018-06-07 (×2): qty 1

## 2018-06-07 MED ORDER — ASPIRIN 81 MG PO CHEW
81.0000 mg | CHEWABLE_TABLET | Freq: Every day | ORAL | Status: DC
  Administered 2018-06-08 – 2018-06-09 (×2): 81 mg via ORAL
  Filled 2018-06-07 (×3): qty 1

## 2018-06-07 MED ORDER — SODIUM CHLORIDE 0.9 % IV SOLN
INTRAVENOUS | Status: DC
  Administered 2018-06-07 – 2018-06-08 (×3): via INTRAVENOUS

## 2018-06-07 MED ORDER — RIVAROXABAN 15 MG PO TABS
15.0000 mg | ORAL_TABLET | Freq: Every day | ORAL | Status: DC
  Administered 2018-06-08: 15 mg via ORAL
  Filled 2018-06-07: qty 1

## 2018-06-07 MED ORDER — ATORVASTATIN CALCIUM 10 MG PO TABS
20.0000 mg | ORAL_TABLET | Freq: Every day | ORAL | Status: DC
  Administered 2018-06-08: 20 mg via ORAL
  Filled 2018-06-07: qty 2

## 2018-06-07 MED ORDER — SERTRALINE HCL 25 MG PO TABS
25.0000 mg | ORAL_TABLET | Freq: Every day | ORAL | Status: DC
  Administered 2018-06-07 – 2018-06-09 (×3): 25 mg via ORAL
  Filled 2018-06-07 (×3): qty 1

## 2018-06-07 NOTE — ED Triage Notes (Signed)
Pt states since yesterday feeling more weak than normal.  Rides the bus and takes more breaks in walking and walks slower due to weakness and fear of falling or passing out. Today was at urology office and felt near syncopal, BP in office at that time 76/40.  Sweaty. CBG 186 Pressure en route in EMS 104/76 Pt a/o x 4, states feeling better now. Pressure now 112/63 Afebrile.

## 2018-06-07 NOTE — ED Provider Notes (Signed)
[facility] EMERGENCY DEPARTMENT Provider Note   CSN: [id number] Arrival date & time: 06/07/18  1142     History   Chief Complaint Chief Complaint  Patient presents with  . Near Syncope  . Hypotension  . Weakness    HPI [name] [name] is a 83 y.o. male.  HPI   [name] [name] is a 83 y.o. male, with a history of a flutter, prostate cancer, Saint Jude pacemaker, presenting to the ED with a near syncopal event that occurred shortly prior to arrival. Patient states he was getting blood work drawn as part of a routine reassessment of his prostate cancer, for which he is receiving active treatment with [facility], Dr. Alyson Ingles. He states he stood up, got hot and sweaty, lightheaded, and had to sit back down.  He felt better after he sat down. Adds he has not been drinking much water because he is "not a water drinker."  He also did not eat breakfast this morning.   Patient states he is on a oral medication for his prostate cancer since December 2019, but cannot remember the name.  He has not currently undergoing radiation.  He no longer takes medications for his blood pressure.  Denies head injury, loss of consciousness, recent illness, fever/chills, cough, headache, neck/back pain, N/V/D, abdominal pain, chest pain, shortness of breath, neuro deficits, or any other complaints.    Past Medical History:  Diagnosis Date  . Atrial flutter ([place])   . Carcinoma of prostate ([place]) 12/30/2011   Treated with seed/radiation therapy.   . Colostomy in place [facility])   . Difficulty sleeping    lives in shelter currently  . Diverticulitis   . Dyslipidemia 12/30/2011  . Frequency of urination   . Heart murmur   . Hypertension 04/30/11   Cardioversion 06/16/11  . Nocturia   . Pacemaker 7/13    sick sinus syndrome/St Jude pacemaker  . Prostate cancer ([place])   . Thyroid disease    had "overactive thyroid in 1997" - no known problem since    Patient Active Problem List   Diagnosis Date Noted  . Near syncope 06/07/2018  . Diverticular disease 05/13/2013  . Renal insufficiency 12/31/2011  . Dyslipidemia 12/30/2011  . Carcinoma of prostate ([place]) 12/30/2011  . Pacemaker-St.Jude 12/03/2011  . Sick sinus syndrome ([place]) 11/28/2011  . Syncope and collapse 11/20/2011  . Atrial flutter with controlled response ([place]) 06/16/2011  . Essential hypertension, benign 06/16/2011    Past Surgical History:  Procedure Laterality Date  . ATRIAL FLUTTER ABLATION N/A 11/21/2011   Procedure: ATRIAL FLUTTER ABLATION;  Surgeon: Thompson Grayer, MD;  Location[facility] C[facility] LAB;  Service: Cardiovascular;  Laterality: N/A;  . CARDIAC ELECTROPHYSIOLOGY STUDY AND ABLATION  7/13  . CARDIOVERSION  06/16/2011   Procedure: CARDIOVERSION;  Surgeon: Laverda Page, MD;  Location: [facility];  Service: Cardiovascular;  Laterality: N/A;  . COLON SURGERY  05/13/2013  . COLOSTOMY CLOSURE N/A 11/25/2013   Procedure: COLOSTOMY CLOSURE;  Surgeon: Earnstine Regal, MD;  Location: [facility] ORS;  Service: General;  Laterality: N/A;  . COLOSTOMY CLOSURE  11/25/2013  . PACEMAKER INSERTION  11/28/11   SJM Accent DR RF implanted by Dr Rayann Heman  . PARTIAL COLECTOMY N/A 05/13/2013   Procedure: sigmoid COLECTOMY  colostomy ;  Surgeon: Earnstine Regal, MD;  Location: [facility] ORS;  Service: General;  Laterality: N/A;  . PERMANENT PACEMAKER INSERTION N/A 11/28/2011   Procedure: PERMANENT PACEMAKER INSERTION;  Surgeon: Thompson Grayer, MD;  Location: [facility] CATH  LAB;  Service: Cardiovascular;  Laterality: N/A;  . RADIOACTIVE SEED IMPLANT    . TONSILLECTOMY          Home Medications    Prior to Admission medications   Medication Sig Start Date End Date Taking? Authorizing Provider  aspirin 81 MG chewable tablet Chew 81 mg by mouth daily.    [provider]  atorvastatin (LIPITOR) 20 MG tablet Take 20 mg by mouth. 04/07/18   [provider]  diltiazem (CARDIZEM CD) 180 MG 24 hr capsule  06/07/18   [provider]    gentian violet (GNP GENTIAN VIOLET) 1 % topical solution Apply 0.5 mLs topically once. To both feet 09/24/15   Landis Martins, DPM  HYDROcodone-acetaminophen (NORCO/VICODIN) 5-325 MG per tablet Take 1-2 tablets by mouth every 4 (four) hours as needed for moderate pain. 11/30/13   Armandina Gemma, MD  metoprolol tartrate (LOPRESSOR) 100 MG tablet  06/05/18   [provider]  metoprolol tartrate (LOPRESSOR) 25 MG tablet Take 25 mg by mouth 2 (two) times daily.  10/26/13   [provider]  olmesartan (BENICAR) 20 MG tablet Take 20 mg by mouth daily. 02/22/18   [provider]  olmesartan-hydrochlorothiazide (BENICAR HCT) 40-12.5 MG per tablet Take 1 tablet by mouth every morning.     [provider]  sertraline (ZOLOFT) 25 MG tablet  06/05/18   [provider]  simvastatin (ZOCOR) 20 MG tablet Take 20 mg by mouth daily.     [provider]  Tamsulosin HCl (FLOMAX) 0.4 MG CAPS Take 0.8 mg by mouth at bedtime.     [provider]  terbinafine (LAMISIL AT) 1 % cream Apply 1 application topically 2 (two) times daily. 09/24/15   Landis Martins, DPM  Vitamin D, Ergocalciferol, (DRISDOL) 50000 UNITS CAPS capsule Take 50,000 Units by mouth every 7 (seven) days.    [provider]  Alveda Reasons 20 MG TABS tablet  06/05/18   [provider]    Family History History reviewed. No pertinent family history.  Social History Social History   Tobacco Use  . Smoking status: Former Smoker    Years: 50.00    Last attempt to quit: 07/20/2009    Years since quitting: 8.8  . Smokeless tobacco: Never Used  Substance Use Topics  . Alcohol use: No  . Drug use: No     Allergies   Patient has no known allergies.   Review of Systems Review of Systems  Constitutional: Negative for chills, diaphoresis and fever.  Respiratory: Negative for cough and shortness of breath.   Cardiovascular: Negative for chest pain.  Gastrointestinal: Negative for  abdominal pain, diarrhea, nausea and vomiting.  Genitourinary: Negative for dysuria, hematuria and testicular pain.  Musculoskeletal: Negative for back pain and neck pain.  Neurological: Positive for light-headedness (resolved). Negative for syncope, weakness, numbness and headaches.  All other systems reviewed and are negative.    Physical Exam Updated Vital Signs BP 112/63 (BP Location: Right Arm)   Pulse 60   Temp 97.6 F (36.4 C) (Oral)   Resp 15   Ht 5\' 7"  (1.702 m)   Wt 74.8 kg   SpO2 100%   BMI 25.84 kg/m   Physical Exam Vitals signs and nursing note reviewed.  Constitutional:      General: He is not in acute distress.    Appearance: He is well-developed. He is not diaphoretic.  HENT:     Head: Normocephalic and atraumatic.     Mouth/Throat:  Mouth: Mucous membranes are dry.     Pharynx: Oropharynx is clear.  Eyes:     Extraocular Movements: Extraocular movements intact.     Conjunctiva/sclera: Conjunctivae normal.     Pupils: Pupils are equal, round, and reactive to light.  Neck:     Musculoskeletal: Neck supple.  Cardiovascular:     Rate and Rhythm: Normal rate and regular rhythm.     Pulses: Normal pulses.     Heart sounds: Normal heart sounds.  Pulmonary:     Effort: Pulmonary effort is normal. No respiratory distress.     Breath sounds: Normal breath sounds.  Abdominal:     Palpations: Abdomen is soft.     Tenderness: There is no abdominal tenderness. There is no guarding.  Musculoskeletal:     Right lower leg: No edema.     Left lower leg: No edema.     Comments: Normal motor function intact in all extremities. No midline spinal tenderness.   Lymphadenopathy:     Cervical: No cervical adenopathy.  Skin:    General: Skin is warm and dry.     Capillary Refill: Capillary refill takes less than 2 seconds.  Neurological:     Mental Status: He is alert.     Comments: Sensation grossly intact to light touch in the extremities. Strength 5/5 in all  extremities. Coordination intact. Cranial nerves III-XII grossly intact. No facial droop.   Psychiatric:        Mood and Affect: Mood and affect normal.        Speech: Speech normal.        Behavior: Behavior normal.      ED Treatments / Results  Labs (all labs ordered are listed, but only abnormal results are displayed) Labs Reviewed  BASIC METABOLIC PANEL - Abnormal; Notable for the following components:      Result Value   CO2 19 (*)    Glucose, Bld 117 (*)    BUN 60 (*)    Creatinine, Ser 2.96 (*)    GFR calc non Af Amer 19 (*)    GFR calc Af Amer 22 (*)    All other components within normal limits  CBC WITH DIFFERENTIAL/PLATELET  URINALYSIS, ROUTINE W REFLEX MICROSCOPIC   BUN  Date Value Ref Range Status  06/07/2018 60 (H) 8 - 23 mg/dL Final  11/27/2013 18 6 - 23 mg/dL Final  11/26/2013 21 6 - 23 mg/dL Final  11/17/2013 21 6 - 23 mg/dL Final   Creatinine, Ser  Date Value Ref Range Status  06/07/2018 2.96 (H) 0.61 - 1.24 mg/dL Final  11/27/2013 1.24 0.50 - 1.35 mg/dL Final  11/26/2013 1.38 (H) 0.50 - 1.35 mg/dL Final  11/25/2013 1.34 0.50 - 1.35 mg/dL Final     EKG EKG Interpretation  Date/Time:  Monday June 07 2018 11:47:59 EST Ventricular Rate:  60 PR Interval:    QRS Duration: 96 QT Interval:  417 QTC Calculation: 417 R Axis:   59 Text Interpretation:  Sinus or ectopic atrial rhythm Atrial premature complex Borderline short PR interval paced on prior ecg 7/13 Confirmed by Aletta Edouard [phone]) on 06/07/2018 12:14:56 PM Also confirmed by Aletta Edouard [phone]), editor Philomena Doheny [phone])  on 06/07/2018 1:01:03 PM   Radiology No results found.  Procedures Procedures (including critical care time)  Medications Ordered in ED Medications  sodium chloride 0.9 % bolus 1,000 mL (1,000 mLs Intravenous New Bag/Given 06/07/18 1501)  sodium chloride 0.9 % bolus 1,000 mL (0 mLs Intravenous Stopped  06/07/18 1500)     Initial Impression / Assessment and  Plan / ED Course  I have reviewed the triage vital signs and the nursing notes.  Pertinent labs & imaging results that were available during my care of the patient were reviewed by me and considered in my medical decision making (see chart for details).  Clinical Course as of Jun 07 1540  Mon Jun 07, 3070  4411 83 year old male said he has not felt well for couple of weeks.  He was at the urologist office today where he had a syncopal event.  He is markedly orthostatic here and seem like he passed out a little bit when they were trying to get a standing BP.  He denies any infectious symptoms.  He is getting lab work EKG chest x-ray and getting some IV fluids.  Likely will need to be admitted.   [MB]  1520 Patient states he feels a bit better.  Still with some generalized weakness.   [SJ]  5790 Patient has no abdominal, flank, or back pain.  No hematuria. No increased difficulty urinating.  Creatinine(!): 2.96 [SJ]  1536 Spoke with Dr. Marthenia Rolling, hospitalist.    [SJ]    Clinical Course User Index [MB] Hayden Rasmussen, MD [SJ] Lorayne Bender, PA-C    Patient presents with near syncopal event. Orthostatic hypotension. Suspect hypovolemia.  Dry on exam.  Patient voiced improvement with IV fluids.  Patient has creatinine of 2.96, increased from last available value of 1.24, however, this was 4 years ago. Pacemaker interrogated. Admitted for continue hydration.  UA and bladder scan pending at time of admission.  Admitting physician will follow up on these items.  Findings and plan of care discussed with Ronnald Ramp, MD. Dr. Melina Copa personally evaluated and examined this patient.  Vitals:   06/07/18 1345 06/07/18 1400 06/07/18 1415 06/07/18 1430  BP: (!) 107/54 125/67 (!) 120/44 (!) 115/59  Pulse: (!) 50 60 (!) 49 61  Resp: 12 12 11 11   Temp:      TempSrc:      SpO2: 100% 99% 100% 100%  Weight:      Height:       Orthostatic VS for the past 24 hrs:  BP- Lying Pulse- Lying BP- Sitting  Pulse- Sitting  06/07/18 1231 105/55 63 (!) 77/57 63      Final Clinical Impressions(s) / ED Diagnoses   Final diagnoses:  AKI (acute kidney injury) [facility])  Near syncope    ED Discharge Orders    None       Layla Maw 06/07/18 1544    Hayden Rasmussen, MD 06/08/18 0930

## 2018-06-07 NOTE — H&P (Signed)
History and Physical  [name] [name] NUU:[id number] DOB: May 06, 1935 DOA: 06/07/2018  Referring physician: ER physician. PCP: [name], MD  Outpatient Specialists:    Patient coming from: Outpatient consult clinic at [facility].  Chief Complaint: "No not feeling well"  HPI:  Patient is an 83 year old African-American male, with past medical history significant for prostate cancer for which the patient has undergone radioactive treatment, and now on oral chemotherapeutic agent; thyroid disease, hypertension, pacemaker placement, hyperlipidemia, diverticulitis and atrial flutter status post cardioversion.  Patient also has chronic kidney disease.  Apparently, serum creatinine checked on November 27, 2013 in this hospital revealed serum creatinine of 1.24.  As per conversation with the patient's primary care provider's office, BUN was 19 and serum creatinine was 1.6 on 05/24/2018.  Patient is not a particularly good historian.  According to the patient, he woke up this morning not feeling well.  Patient could not or would not elaborate further about what he meant by not feeling well.  The patient went to the outpatient's Oncologist's office for blood draw and started perspiring after the blood draw according to the patient.  No headache, no neck pain, no chest pain, no shortness of breath, no fever or chills, no GI symptoms and no urinary symptoms.  Patient was said to be orthostatic on presentation, and looked volume depleted, with BUN of 60 and serum creatinine of 2.96.  Patient's initial systolic blood pressure on presentation was 77 mmHg.  Patient will be admitted for further assessment and management.  ED Course: Patient was volume resuscitated.  The hospitalist team was called to admit patient. Pertinent labs: CBC reveals WBC of 6.6, hemoglobin of 13.2, hematocrit of 41.4, MCV of 94.7 with platelet count of 222.  BMP revealed sodium of 141, potassium of 3.9, chloride 109, CO2 of 19, BUN of  60, creatinine of 2.96 and blood sugar of 117.  This is done earlier today revealed specific gravity of 1.018. EKG: Independently reviewed.  Heart rate of 60 bpm with premature atrial complexes.  Review of Systems:  Negative for fever, visual changes, sore throat, rash, new muscle aches, chest pain, SOB, dysuria, bleeding, n/v/abdominal pain.  Past Medical History:  Diagnosis Date  . Atrial flutter ([place])   . Carcinoma of prostate ([name]) 12/30/2011   Treated with seed/radiation therapy.   . Colostomy in place [facility])   . Difficulty sleeping    lives in shelter currently  . Diverticulitis   . Dyslipidemia 12/30/2011  . Frequency of urination   . Heart murmur   . Hypertension 04/30/11   Cardioversion 06/16/11  . Nocturia   . Pacemaker 7/13    sick sinus syndrome/[employer] Jude pacemaker  . Prostate cancer ([name])   . Thyroid disease    had "overactive thyroid in 1997" - no known problem since    Past Surgical History:  Procedure Laterality Date  . ATRIAL FLUTTER ABLATION N/A 11/21/2011   Procedure: ATRIAL FLUTTER ABLATION;  Surgeon: Thompson Grayer, MD;  Location: [facility] CATH LAB;  Service: Cardiovascular;  Laterality: N/A;  . CARDIAC ELECTROPHYSIOLOGY STUDY AND ABLATION  7/13  . CARDIOVERSION  06/16/2011   Procedure: CARDIOVERSION;  Surgeon: Laverda Page, MD;  Location: [place];  Service: Cardiovascular;  Laterality: N/A;  . COLON SURGERY  05/13/2013  . COLOSTOMY CLOSURE N/A 11/25/2013   Procedure: COLOSTOMY CLOSURE;  Surgeon: Earnstine Regal, MD;  Location: [facility] ORS;  Service: General;  Laterality: N/A;  . COLOSTOMY CLOSURE  11/25/2013  . PACEMAKER INSERTION  11/28/11  SJM Accent DR RF implanted by Dr Rayann Heman  . PARTIAL COLECTOMY N/A 05/13/2013   Procedure: sigmoid COLECTOMY  colostomy ;  Surgeon: Earnstine Regal, MD;  Location: [facility] ORS;  Service: General;  Laterality: N/A;  . PERMANENT PACEMAKER INSERTION N/A 11/28/2011   Procedure: PERMANENT PACEMAKER INSERTION;  Surgeon: Thompson Grayer, MD;  Location: [facility] CATH  LAB;  Service: Cardiovascular;  Laterality: N/A;  . RADIOACTIVE SEED IMPLANT    . TONSILLECTOMY       reports that he quit smoking about 8 years ago. He quit after 50.00 years of use. He has never used smokeless tobacco. He reports that he does not drink alcohol or use drugs.  No Known Allergies  History reviewed. No pertinent family history.   Prior to Admission medications   Medication Sig Start Date End Date Taking? Authorizing Provider  aspirin 81 MG chewable tablet Chew 81 mg by mouth daily.    [provider]  atorvastatin (LIPITOR) 20 MG tablet Take 20 mg by mouth. 04/07/18   [provider]  diltiazem (CARDIZEM CD) 180 MG 24 hr capsule  06/07/18   [provider]  gentian violet (GNP GENTIAN VIOLET) 1 % topical solution Apply 0.5 mLs topically once. To both feet 09/24/15   Landis Martins, DPM  HYDROcodone-acetaminophen (NORCO/VICODIN) 5-325 MG per tablet Take 1-2 tablets by mouth every 4 (four) hours as needed for moderate pain. 11/30/13   Armandina Gemma, MD  metoprolol tartrate (LOPRESSOR) 100 MG tablet  06/05/18   [provider]  metoprolol tartrate (LOPRESSOR) 25 MG tablet Take 25 mg by mouth 2 (two) times daily.  10/26/13   [provider]  olmesartan (BENICAR) 20 MG tablet Take 20 mg by mouth daily. 02/22/18   [provider]  olmesartan-hydrochlorothiazide (BENICAR HCT) 40-12.5 MG per tablet Take 1 tablet by mouth every morning.     [provider]  sertraline (ZOLOFT) 25 MG tablet  06/05/18   [provider]  simvastatin (ZOCOR) 20 MG tablet Take 20 mg by mouth daily.     [provider]  Tamsulosin HCl (FLOMAX) 0.4 MG CAPS Take 0.8 mg by mouth at bedtime.     [provider]  terbinafine (LAMISIL AT) 1 % cream Apply 1 application topically 2 (two) times daily. 09/24/15   Landis Martins, DPM  Vitamin D, Ergocalciferol, (DRISDOL) 50000 UNITS CAPS capsule Take 50,000 Units by mouth every 7 (seven)  days.    [provider]  XARELTO 20 MG TABS tablet  06/05/18   [provider]    Physical Exam: Vitals:   06/07/18 1430 06/07/18 1445 06/07/18 1545 06/07/18 1600  BP: (!) 115/59 (!) 119/38 130/67 (!) 128/58  Pulse: 61 (!) 50 (!) 58 72  Resp: 11 10 15 12   Temp:      TempSrc:      SpO2: 100% 100% 100% 100%  Weight:      Height:        Constitutional:  . Appears calm and comfortable Eyes:  . No pallor. No jaundice.  ENMT:  . external ears, nose appear normal.  Dry buccal mucosa. Neck:  . Neck is supple. No JVD Respiratory:  . CTA bilaterally, no w/r/r.  . Respiratory effort normal. No retractions or accessory muscle use Cardiovascular:  . S1S2 . No LE extremity edema   Abdomen:  . Abdomen is soft and non tender. Organs are difficult to assess. Neurologic:  . Awake and alert. . Moves all limbs.  Wt Readings from  Last 3 Encounters:  06/07/18 74.8 kg  12/19/13 64.9 kg  12/06/13 64.6 kg    I have personally reviewed following labs and imaging studies  Labs on Admission:  CBC: Recent Labs  Lab 06/07/18 1357  WBC 6.6  NEUTROABS 3.6  HGB 13.2  HCT 41.4  MCV 94.7  PLT 431   Basic Metabolic Panel: Recent Labs  Lab 06/07/18 1357  NA 141  K 3.9  CL 109  CO2 19*  GLUCOSE 117*  BUN 60*  CREATININE 2.96*  CALCIUM 9.2   Liver Function Tests: No results for input(s): AST, ALT, ALKPHOS, BILITOT, PROT, ALBUMIN in the last 168 hours. No results for input(s): LIPASE, AMYLASE in the last 168 hours. No results for input(s): AMMONIA in the last 168 hours. Coagulation Profile: No results for input(s): INR, PROTIME in the last 168 hours. Cardiac Enzymes: No results for input(s): CKTOTAL, CKMB, CKMBINDEX, TROPONINI in the last 168 hours. BNP (last 3 results) No results for input(s): PROBNP in the last 8760 hours. HbA1C: No results for input(s): HGBA1C in the last 72 hours. CBG: No results for input(s): GLUCAP in the last 168 hours. Lipid  Profile: No results for input(s): CHOL, HDL, LDLCALC, TRIG, CHOLHDL, LDLDIRECT in the last 72 hours. Thyroid Function Tests: No results for input(s): TSH, T4TOTAL, FREET4, T3FREE, THYROIDAB in the last 72 hours. Anemia Panel: No results for input(s): VITAMINB12, FOLATE, FERRITIN, TIBC, IRON, RETICCTPCT in the last 72 hours. Urine analysis:    Component Value Date/Time   COLORURINE YELLOW 06/07/2018 1600   APPEARANCEUR CLOUDY (A) 06/07/2018 1600   LABSPEC 1.018 06/07/2018 1600   PHURINE 5.0 06/07/2018 1600   GLUCOSEU NEGATIVE 06/07/2018 1600   HGBUR NEGATIVE 06/07/2018 1600   BILIRUBINUR NEGATIVE 06/07/2018 1600   KETONESUR NEGATIVE 06/07/2018 1600   PROTEINUR NEGATIVE 06/07/2018 1600   UROBILINOGEN 1.0 04/10/2013 0706   NITRITE NEGATIVE 06/07/2018 1600   LEUKOCYTESUR NEGATIVE 06/07/2018 1600   Sepsis Labs: @LABRCNTIP (procalcitonin:4,lacticidven:4) )No results found for this or any previous visit (from the past 240 hour(s)).    Radiological Exams on Admission: No results found.  EKG: Independently reviewed.   Active Problems:   Near syncope   Assessment/Plan Malaise/near syncope: -Possibly related to volume depletion. -Cannot rule out underlying viral syndrome. -Continue with IV fluids. -Influenza PCR. -Cycle cardiac enzymes. -Echocardiogram. -Check orthostasis. -Further management depend on hospital course.  Acute kidney injury on chronic kidney disease: Acute component could be prerenal. IV fluids. Check urine sodium, urine protein urine creatinine. ANA. Adjust patient's home medication. Renal ultrasound.  Orthostatic hypotension/hypotension: Likely related to volume depletion. Hypotension resolved with volume depletion. Decrease Cardizem to 30 mg p.o. every 8 hourly Decrease beta-blocker to metoprolol 25 mg p.o. twice daily. Check orthostasis every 12 hourly Monitor blood pressure closely. Further management will depend on hospital course.  Prostate  cancer: Follow with the oncology team on discharge.  History of atrial flutter: Continue to monitor during the hospital stay.  Further management will depend on hospital course.  DVT prophylaxis: Xarelto. Code Status: Full code Family Communication:  Disposition Plan: Home eventually Consults called: None Admission status: observation  Time spent: 65 minutes  Dana Allan, MD  [facility] Pager #: [phone] 7PM-7AM contact night coverage as above  06/07/2018, 4:54 PM

## 2018-06-07 NOTE — ED Notes (Signed)
[employer] called with quick report of device interrogation. States new onset runsn of a flutter with rapid V rate in 160's.  Runs of 1-2 minutes, longest this am at 11:00 was 2 hours long. Rep to fax in full report to [facility] ED for review.

## 2018-06-07 NOTE — ED Notes (Signed)
Pt's st jude device interrogated, waiting on results to be faxed/called in.

## 2018-06-08 ENCOUNTER — Observation Stay (HOSPITAL_BASED_OUTPATIENT_CLINIC_OR_DEPARTMENT_OTHER): Payer: Medicare Other

## 2018-06-08 DIAGNOSIS — I951 Orthostatic hypotension: Secondary | ICD-10-CM | POA: Diagnosis not present

## 2018-06-08 DIAGNOSIS — N179 Acute kidney failure, unspecified: Secondary | ICD-10-CM

## 2018-06-08 DIAGNOSIS — I4892 Unspecified atrial flutter: Secondary | ICD-10-CM | POA: Diagnosis not present

## 2018-06-08 DIAGNOSIS — R55 Syncope and collapse: Secondary | ICD-10-CM | POA: Diagnosis not present

## 2018-06-08 DIAGNOSIS — I10 Essential (primary) hypertension: Secondary | ICD-10-CM

## 2018-06-08 HISTORY — DX: Acute kidney failure, unspecified: N17.9

## 2018-06-08 HISTORY — DX: Orthostatic hypotension: I95.1

## 2018-06-08 LAB — CBC
HCT: 33.7 % — ABNORMAL LOW (ref 39.0–52.0)
Hemoglobin: 11.3 g/dL — ABNORMAL LOW (ref 13.0–17.0)
MCH: 30.7 pg (ref 26.0–34.0)
MCHC: 33.5 g/dL (ref 30.0–36.0)
MCV: 91.6 fL (ref 80.0–100.0)
Platelets: 194 10*3/uL (ref 150–400)
RBC: 3.68 MIL/uL — ABNORMAL LOW (ref 4.22–5.81)
RDW: 13.6 % (ref 11.5–15.5)
WBC: 6.9 10*3/uL (ref 4.0–10.5)
nRBC: 0 % (ref 0.0–0.2)

## 2018-06-08 LAB — PROTEIN, URINE, RANDOM: Total Protein, Urine: 13 mg/dL

## 2018-06-08 LAB — BASIC METABOLIC PANEL
Anion gap: 7 (ref 5–15)
BUN: 43 mg/dL — ABNORMAL HIGH (ref 8–23)
CO2: 21 mmol/L — ABNORMAL LOW (ref 22–32)
Calcium: 8.4 mg/dL — ABNORMAL LOW (ref 8.9–10.3)
Chloride: 113 mmol/L — ABNORMAL HIGH (ref 98–111)
Creatinine, Ser: 1.95 mg/dL — ABNORMAL HIGH (ref 0.61–1.24)
GFR calc Af Amer: 37 mL/min — ABNORMAL LOW (ref >60)
GFR calc non Af Amer: 32 mL/min — ABNORMAL LOW (ref >60)
Glucose, Bld: 117 mg/dL — ABNORMAL HIGH (ref 70–99)
Potassium: 4 mmol/L (ref 3.5–5.1)
Sodium: 141 mmol/L (ref 135–145)

## 2018-06-08 LAB — ECHOCARDIOGRAM COMPLETE
Height: 67 in
Weight: 2617.3 oz

## 2018-06-08 LAB — SODIUM, URINE, RANDOM: Sodium, Ur: 43 mmol/L

## 2018-06-08 LAB — CREATININE, URINE, RANDOM: Creatinine, Urine: 133.51 mg/dL

## 2018-06-08 MED ORDER — FLECAINIDE ACETATE 50 MG PO TABS
50.0000 mg | ORAL_TABLET | Freq: Two times a day (BID) | ORAL | Status: DC
  Administered 2018-06-08 – 2018-06-09 (×2): 50 mg via ORAL
  Filled 2018-06-08 (×2): qty 1

## 2018-06-08 MED ORDER — METOPROLOL TARTRATE 25 MG PO TABS
25.0000 mg | ORAL_TABLET | Freq: Two times a day (BID) | ORAL | Status: DC
  Administered 2018-06-08 – 2018-06-09 (×2): 25 mg via ORAL
  Filled 2018-06-08 (×2): qty 1

## 2018-06-08 NOTE — Evaluation (Signed)
Physical Therapy Evaluation and Discharge Patient Details Name: [name] [name] MRN: [id number] DOB: [birth year] Today's Date: 06/08/2018   History of Present Illness  Pt is an 83 y/o male with a PMH significant for prostate CA, pacemaker, HTN, heart murmur, diverticulitis, colostomy.  Clinical Impression  Patient evaluated by Physical Therapy with no further acute PT needs identified. All education has been completed and the patient has no further questions. At the time of PT eval pt was able to perform transfers and ambulation with no AD and gross modified independence to indpendence. Pt reporting that he cares for his grandchildren and was very independent PTA. See below for any follow-up Physical Therapy or equipment needs. PT is signing off. Thank you for this referral.     Follow Up Recommendations No PT follow up;Supervision - Intermittent    Equipment Recommendations  None recommended by PT    Recommendations for Other Services       Precautions / Restrictions Precautions Precautions: Fall Restrictions Weight Bearing Restrictions: No      Mobility  Bed Mobility Overal bed mobility: Independent             General bed mobility comments: Pt was able to transition to/from EOB without assistance. HOB slightly elevated however do not anticipate HOB flat would be problematic for him.   Transfers Overall transfer level: Independent Equipment used: None             General transfer comment: Pt demonstrated proper hand placement on seated surface for safety.   Ambulation/Gait Ambulation/Gait assistance: Modified independent (Device/Increase time) Gait Distance (Feet): 500 Feet Assistive device: None Gait Pattern/deviations: Step-through pattern;Decreased stride length;Trunk flexed Gait velocity: Decreased Gait velocity interpretation: 1.31 - 2.62 ft/sec, indicative of limited community ambulator General Gait Details: Slow but generally steady. No assist required. Pt  ambulated ~500 feet with no reports of fatigue and no DOE.   Stairs            Wheelchair Mobility    Modified Rankin (Stroke Patients Only)       Balance Overall balance assessment: Mild deficits observed, not formally tested                                           Pertinent Vitals/Pain Pain Assessment: Faces Faces Pain Scale: Hurts a little bit Pain Location: L knee Pain Descriptors / Indicators: Aching Pain Intervention(s): Limited activity within patient's tolerance;Monitored during session;Repositioned    Home Living Family/patient expects to be discharged to:: Private residence Living Arrangements: Alone Available Help at Discharge: Family;Available PRN/intermittently Type of Home: Apartment Home Access: Level entry     Home Layout: One level Home Equipment: None Additional Comments: Poor historian. Pt reports he does not have anyone to assist him, however during conversation mentions he watches his grandchildren during the day and his nephew will be the one coming to take him home from the hospital. So, maybe a daughter and nephew available PRN to assist?    Prior Function Level of Independence: Independent         Comments: Reports he does not drive, and does not use any AD for mobility. Pt does endorse one fall a few weeks ago in which pt was running to get to the bus stop on time and tripped and fell, injuring his L knee and L wrist.      Hand Dominance  Extremity/Trunk Assessment   Upper Extremity Assessment Upper Extremity Assessment: Overall WFL for tasks assessed    Lower Extremity Assessment Lower Extremity Assessment: LLE deficits/detail LLE Deficits / Details: Pt reports ongoing pain in L knee from fall    Cervical / Trunk Assessment Cervical / Trunk Assessment: Normal  Communication   Communication: No difficulties  Cognition Arousal/Alertness: Awake/alert Behavior During Therapy: Flat affect Overall  Cognitive Status: Within Functional Limits for tasks assessed                                        General Comments      Exercises     Assessment/Plan    PT Assessment Patent does not need any further PT services  PT Problem List         PT Treatment Interventions      PT Goals (Current goals can be found in the Care Plan section)  Acute Rehab PT Goals Patient Stated Goal: Return home and care for his grandchildren PT Goal Formulation: All assessment and education complete, DC therapy    Frequency     Barriers to discharge        Co-evaluation               AM-PAC PT "6 Clicks" Mobility  Outcome Measure Help needed turning from your back to your side while in a flat bed without using bedrails?: None Help needed moving from lying on your back to sitting on the side of a flat bed without using bedrails?: None Help needed moving to and from a bed to a chair (including a wheelchair)?: None Help needed standing up from a chair using your arms (e.g., wheelchair or bedside chair)?: None Help needed to walk in hospital room?: None Help needed climbing 3-5 steps with a railing? : None 6 Click Score: 24    End of Session   Activity Tolerance: Patient tolerated treatment well Patient left: in bed;with call [employer]/phone within reach Nurse Communication: Mobility status PT Visit Diagnosis: History of falling (Z91.81);Difficulty in walking, not elsewhere classified (R26.2)    Time: 6812-7517 PT Time Calculation (min) (ACUTE ONLY): 16 min   Charges:   PT Evaluation $PT Eval Moderate Complexity: 1 Mod          [name] [name], PT, DPT Acute Rehabilitation Services Pager: [phone] Office: [phone]   Thelma Comp 06/08/2018, 1:44 PM

## 2018-06-08 NOTE — Consult Note (Signed)
Reason for Consult: Tachycardia Referring Physician: [facility]  Dmitry Macomber is an 83 y.o. male.  HPI:   83 y/o African [place] male with sick sinus syndrome s/p pacemaker, paroxysmal atrial flutter s/p prior ablation, currnelty on flecainide and anticaogulation.   Patient was last seen by Dr. Einar Gip in 03/2018. Patient was admitted to [facility] on 07/05/2018 with generalized malaise symptoms. He was found to be orthostatic and volume depleted, and being rehydrated appropriately. His cardizem and metoprolol doses were decreased. Cardiology were consulted for episodes of tachycardia.   Patient reports that he had episodes of lightheadedness, one episode of brief syncope at home on 06/04/2018. He has not been compliant with his cardiac medications- flecainide, metoprolol, or xarelto. He reports that he does not drink water.  He is feeling much better after rehydration.   Past Medical History:  Diagnosis Date  . Atrial flutter ([place])   . Carcinoma of prostate ([place]) 12/30/2011   Treated with seed/radiation therapy.   . Colostomy in [facility])   . Difficulty sleeping    lives in shelter currently  . Diverticulitis   . Dyslipidemia 12/30/2011  . Frequency of urination   . Heart murmur   . Hypertension 04/30/11   Cardioversion 06/16/11  . Nocturia   . Pacemaker 7/13    sick sinus syndrome/[employer] pacemaker  . Prostate cancer ([place])   . Thyroid disease    had "overactive thyroid in 1997" - no known problem since    Past Surgical History:  Procedure Laterality Date  . ATRIAL FLUTTER ABLATION N/A 11/21/2011   Procedure: ATRIAL FLUTTER ABLATION;  Surgeon: Thompson Grayer, MD;  Location: [facility] CATH LAB;  Service: Cardiovascular;  Laterality: N/A;  . CARDIAC ELECTROPHYSIOLOGY STUDY AND ABLATION  7/13  . CARDIOVERSION  06/16/2011   Procedure: CARDIOVERSION;  Surgeon: Laverda Page, MD;  Location: [place];  Service: Cardiovascular;  Laterality: N/A;  . COLON SURGERY  05/13/2013  .  COLOSTOMY CLOSURE N/A 11/25/2013   Procedure: COLOSTOMY CLOSURE;  Surgeon: Earnstine Regal, MD;  Location: [facility] ORS;  Service: General;  Laterality: N/A;  . COLOSTOMY CLOSURE  11/25/2013  . PACEMAKER INSERTION  11/28/11   SJM Accent DR RF implanted by Dr Rayann Heman  . PARTIAL COLECTOMY N/A 05/13/2013   Procedure: sigmoid COLECTOMY  colostomy ;  Surgeon: Earnstine Regal, MD;  Location: [facility] ORS;  Service: General;  Laterality: N/A;  . PERMANENT PACEMAKER INSERTION N/A 11/28/2011   Procedure: PERMANENT PACEMAKER INSERTION;  Surgeon: Thompson Grayer, MD;  Location: [facility] CATH LAB;  Service: Cardiovascular;  Laterality: N/A;  . RADIOACTIVE SEED IMPLANT    . TONSILLECTOMY      History reviewed. No pertinent family history.  Social History:  reports that he quit smoking about 8 years ago. He quit after 50.00 years of use. He has never used smokeless tobacco. He reports that he does not drink alcohol or use drugs.  Allergies:  Allergies  Allergen Reactions  . Xarelto [Rivaroxaban] Other (See Comments)    "I didn't like the way it made me feel" (patient didn't elaborate)    Medications: I have reviewed the patient's current medications.  Results for orders placed or performed during the hospital encounter of 06/07/18 (from the past 48 hour(s))  Basic metabolic panel     Status: Abnormal   Collection Time: 06/07/18  1:57 PM  Result Value Ref Range   Sodium 141 135 - 145 mmol/L   Potassium 3.9 3.5 - 5.1 mmol/L   Chloride 109  98 - 111 mmol/L   CO2 19 (L) 22 - 32 mmol/L   Glucose, Bld 117 (H) 70 - 99 mg/dL   BUN 60 (H) 8 - 23 mg/dL   Creatinine, Ser 2.96 (H) 0.61 - 1.24 mg/dL   Calcium 9.2 8.9 - 10.3 mg/dL   GFR calc non Af Amer 19 (L) >60 mL/min   GFR calc Af Amer 22 (L) >60 mL/min   Anion gap 13 5 - 15    Comment: Performed at [facility] [address], [place], [place] [address]  CBC with Differential     Status: None   Collection Time: 06/07/18  1:57 PM  Result Value Ref Range   WBC 6.6 4.0 -  10.5 K/uL   RBC 4.37 4.22 - 5.81 MIL/uL   Hemoglobin 13.2 13.0 - 17.0 g/dL   HCT 41.4 39.0 - 52.0 %   MCV 94.7 80.0 - 100.0 fL   MCH 30.2 26.0 - 34.0 pg   MCHC 31.9 30.0 - 36.0 g/dL   RDW 13.6 11.5 - 15.5 %   Platelets 222 150 - 400 K/uL   nRBC 0.0 0.0 - 0.2 %   Neutrophils Relative % 55 %   Neutro Abs 3.6 1.7 - 7.7 K/uL   Lymphocytes Relative 32 %   Lymphs Abs 2.1 0.7 - 4.0 K/uL   Monocytes Relative 11 %   Monocytes Absolute 0.7 0.1 - 1.0 K/uL   Eosinophils Relative 1 %   Eosinophils Absolute 0.1 0.0 - 0.5 K/uL   Basophils Relative 0 %   Basophils Absolute 0.0 0.0 - 0.1 K/uL   Immature Granulocytes 1 %   Abs Immature Granulocytes 0.03 0.00 - 0.07 K/uL    Comment: Performed at [facility], [address], [place], [place] [address]  Urinalysis, Routine w reflex microscopic     Status: Abnormal   Collection Time: 06/07/18  4:00 PM  Result Value Ref Range   Color, Urine YELLOW YELLOW   APPearance CLOUDY (A) CLEAR   Specific Gravity, Urine 1.018 1.005 - 1.030   pH 5.0 5.0 - 8.0   Glucose, UA NEGATIVE NEGATIVE mg/dL   Hgb urine dipstick NEGATIVE NEGATIVE   Bilirubin Urine NEGATIVE NEGATIVE   Ketones, ur NEGATIVE NEGATIVE mg/dL   Protein, ur NEGATIVE NEGATIVE mg/dL   Nitrite NEGATIVE NEGATIVE   Leukocytes, UA NEGATIVE NEGATIVE    Comment: Performed at [facility] [address] [place], [place] [address]  Urinalysis, Complete w Microscopic     Status: Abnormal   Collection Time: 06/07/18  6:01 PM  Result Value Ref Range   Color, Urine YELLOW YELLOW   APPearance CLOUDY (A) CLEAR   Specific Gravity, Urine 1.015 1.005 - 1.030   pH 5.0 5.0 - 8.0   Glucose, UA 50 (A) NEGATIVE mg/dL   Hgb urine dipstick NEGATIVE NEGATIVE   Bilirubin Urine NEGATIVE NEGATIVE   Ketones, ur NEGATIVE NEGATIVE mg/dL   Protein, ur NEGATIVE NEGATIVE mg/dL   Nitrite NEGATIVE NEGATIVE   Leukocytes, UA NEGATIVE NEGATIVE   RBC / HPF 0-5 0 - 5 RBC/hpf   WBC, UA 0-5 0 - 5 WBC/hpf    Bacteria, UA NONE SEEN NONE SEEN   Hyaline Casts, UA PRESENT    Uric Acid Crys, UA PRESENT     Comment: Performed at [facility] [address] [place], [place] [address]  Sodium, urine, random     Status: None   Collection Time: 06/07/18  6:01 PM  Result Value Ref Range   Sodium, Ur 43 mmol/L    Comment: Performed at [facility] [address], [place], [place] [address]  Protein, urine, random     Status: None   Collection Time: 06/07/18  6:01 PM  Result Value Ref Range   Total Protein, Urine 13 mg/dL    Comment: NO NORMAL RANGE ESTABLISHED FOR THIS TEST Performed at [facility] [address] [place], [place] [address]   Creatinine, urine, random     Status: None   Collection Time: 06/07/18  6:01 PM  Result Value Ref Range   Creatinine, Urine 133.51 mg/dL    Comment: Performed at [facility] [address] [place], [place] [address]  Magnesium     Status: None   Collection Time: 06/07/18  7:05 PM  Result Value Ref Range   Magnesium 1.9 1.7 - 2.4 mg/dL    Comment: Performed at [facility], [place] [address] [place], [place] [address]  Phosphorus     Status: None   Collection Time: 06/07/18  7:05 PM  Result Value Ref Range   Phosphorus 3.3 2.5 - 4.6 mg/dL    Comment: Performed at [facility], [place] [address], [place], [place] [address]  Troponin I - Now Then Q3H     Status: None   Collection Time: 06/07/18  7:05 PM  Result Value Ref Range   Troponin I <0.03 <0.03 ng/mL    Comment: Performed at [facility] [address] [place], [place] [address]  Influenza panel by PCR (type A & B)     Status: None   Collection Time: 06/07/18  7:32 PM  Result Value Ref Range   Influenza A By PCR NEGATIVE NEGATIVE   Influenza B By PCR NEGATIVE NEGATIVE    Comment: (NOTE) The Xpert Xpress Flu assay is intended as an aid in the diagnosis of  influenza and should not be used as a sole basis for treatment.  This  assay is FDA approved for  nasopharyngeal swab specimens only. Nasal  washings and aspirates are unacceptable for Xpert Xpress Flu testing. Performed at [facility], [place] [address] [place], [place] [address]   Troponin I - Now Then Q3H     Status: None   Collection Time: 06/07/18 10:25 PM  Result Value Ref Range   Troponin I <0.03 <0.03 ng/mL    Comment: Performed at [facility] [address], [place], [place] [address]  Basic metabolic panel     Status: Abnormal   Collection Time: 06/08/18  5:47 AM  Result Value Ref Range   Sodium 141 135 - 145 mmol/L   Potassium 4.0 3.5 - 5.1 mmol/L   Chloride 113 (H) 98 - 111 mmol/L   CO2 21 (L) 22 - 32 mmol/L   Glucose, Bld 117 (H) 70 - 99 mg/dL   BUN 43 (H) 8 - 23 mg/dL   Creatinine, Ser 1.95 (H) 0.61 - 1.24 mg/dL    Comment: DELTA CHECK NOTED   Calcium 8.4 (L) 8.9 - 10.3 mg/dL   GFR calc non Af Amer 32 (L) >60 mL/min   GFR calc Af Amer 37 (L) >60 mL/min   Anion gap 7 5 - 15    Comment: Performed at [facility] [address], [place],  [address]  CBC     Status: Abnormal   Collection Time: 06/08/18  5:47 AM  Result  Value Ref Range   WBC 6.9 4.0 - 10.5 K/uL   RBC 3.68 (L) 4.22 - 5.81 MIL/uL   Hemoglobin 11.3 (L) 13.0 - 17.0 g/dL   HCT 33.7 (L) 39.0 - 52.0 %   MCV 91.6 80.0 - 100.0 fL   MCH 30.7 26.0 - 34.0 pg   MCHC 33.5 30.0 - 36.0 g/dL   RDW 13.6 11.5 - 15.5 %   Platelets 194 150 - 400 K/uL   nRBC 0.0 0.0 - 0.2 %    Comment: Performed at [facility], [place] [address] [place], [place] [address]    Dg Chest 2 View  Result Date: 06/07/2018 CLINICAL DATA:  83 year old male with increased weakness since yesterday, near syncope. EXAM: CHEST - 2 VIEW COMPARISON:  06/07/2014 chest radiographs and earlier. FINDINGS: Chronically large lung volumes are stable. Chronic left chest dual lead cardiac pacemaker is stable. Mediastinal contours remain normal. Visualized tracheal air column is within normal limits. No pneumothorax, pulmonary  edema, pleural effusion or confluent pulmonary opacity. No acute osseous abnormality identified. Negative visible bowel gas pattern. IMPRESSION: Chronic hyperinflation.  No acute cardiopulmonary abnormality. Electronically Signed   By: Genevie Ann M.D.   On: 06/07/2018 17:33   US Renal  Result Date: 06/07/2018 CLINICAL DATA:  Acute kidney injury.  Elevated BUN and creatinine. EXAM: RENAL / URINARY TRACT ULTRASOUND COMPLETE COMPARISON:  Abdomen and pelvis CT dated 02/10/2017. FINDINGS: Right Kidney: Renal measurements: 7.6 x 4.8 x 4.4 cm = volume: 82 mL . Echogenicity within normal limits. No mass or hydronephrosis visualized. Left Kidney: Renal measurements: 9.5 x 4.9 x 4.6 cm = volume: 112 mL. Echogenicity within normal limits. No mass or hydronephrosis visualized. Bladder: Appears normal for degree of bladder distention. IMPRESSION: The right kidney remains smaller than the left kidney. Otherwise, normal examination. Electronically Signed   By: Claudie Revering M.D.   On: 06/07/2018 21:52   Cardiac studies: EKG 06/08/2018: Atrial paced rhythm  EKG 06/07/2018: Ectopic atrial rhythm with occasional PAC  Echocardiogram 06/08/2018:  1. The left ventricle has hyperdynamic systolic function of >56%. The cavity size is normal. There is no increased left ventricular wall thickness. Echo evidence of normal diastolic filling patterns. Normal left ventricular filling pressures.  2. The aortic valve is tricuspid in structure. There is mild thickening and mild calcification of the aortic valve.   Review of Systems  Constitutional: Positive for malaise/fatigue.  HENT: Negative.   Respiratory: Negative for shortness of breath.   Cardiovascular: Negative for chest pain and leg swelling.  Gastrointestinal: Negative for abdominal pain and nausea.  Skin: Negative.   Neurological: Positive for loss of consciousness (On 06/04/2018). Negative for speech change.  Endo/Heme/Allergies: Does not bruise/bleed easily.  All  other systems reviewed and are negative.  Blood pressure (!) 146/63, pulse (!) 50, temperature 97.8 F (36.6 C), temperature source Oral, resp. rate 18, height 5\' 7"  (1.702 m), weight 74.2 kg, SpO2 100 %. Physical Exam  Nursing note and vitals reviewed. Constitutional: He is oriented to person, place, and time. He appears well-developed and well-nourished. No distress.  HENT:  Head: Normocephalic and atraumatic.  Eyes: Pupils are equal, round, and reactive to light. Conjunctivae are normal.  Neck: No JVD present.  Cardiovascular: Normal rate, regular rhythm and normal heart sounds.  No murmur heard. Respiratory: Effort normal and breath sounds normal. He has no wheezes. He has no rales.  Musculoskeletal:        General: No edema.  Lymphadenopathy:    He  has no cervical adenopathy.  Neurological: He is alert and oriented to person, place, and time. No cranial nerve deficit.  Skin: Skin is warm and dry.  Psychiatric: He has a normal mood and affect.    Assessment/Recommendations:  83 y/o [place] male with sick sinus syndrome s/p pacemaker, paroxysmal atrial flutter s/p prior ablation, currnelty on flecainide and anticaogulation.    Paroxysmal atrial flutter: CHA2DS2VAsc score 3, annual stroke risk 3.6% Multiple episodes of atrial flutter, longest occurring on 06/06/2018 lasting for 2 hr 34 min, with fastest episodes with V rate in 160s. I have not seen any Aflutter episodes on telemetry. He has atrial paced rhythm for the  most part, with occasional AV paced rhythm.   Resume antiarrhythmic therapy given symptomatic atiral flutter. Start flecainide at 50 mg bid with metoprolol tartarate 25 mg bid. Start Xarelto at 15 mg daily (Renally dosed).  Agree with rehydration as you are doing. Patient may be ready for discharge tomorrow on 0/2/09/2018. Will arrange outpatient follow up with Dr. Einar Gip.   Vere Diantonio J Aritza Brunet 06/08/2018, 4:25 PM   Darion Milewski Esther Hardy, MD [facility]  Cardiovascular. [place] Pager: [phone] Office: [phone] If no answer Cell [phone]

## 2018-06-08 NOTE — Progress Notes (Signed)
Echocardiogram 2D Echocardiogram has been performed.  [name] [name] 06/08/2018, 10:52 AM

## 2018-06-08 NOTE — Progress Notes (Signed)
Progress Note    Unique Searfoss  FWY:[id number] DOB: August 29, 1935  DOA: 06/07/2018 PCP: [name], MD    Brief Narrative:     Medical records reviewed and are as summarized below:  [name] [name] is an 83 y.o. male who states since yesterday feeling more weak than normal.  Rides the bus and takes more breaks in walking and walks slower due to weakness and fear of falling or passing out.  Today was at urology office and felt near syncopal, BP in office at that time 76/40.  Feeling better since given IVF.  Markedly positive orthostatics.   Assessment/Plan:   Active Problems:   AKI (acute kidney injury) (HCC)   Atrial flutter (HCC)   Orthostatic hypotension  Orthostatic hypotension -d/c HCTZ -encourage fluid intake -BP improved with IVF -recheck in AM -patient states he is not on Cardizem/metoprolol  AKI -CR was 2.96 and is now down to 1.95 -recheck in AM -baseline: 1.24  Atrial flutter -per pacemaker interrogation: [employer] called with quick report of device interrogation. States new onset runsn of a flutter with rapid V rate in 160's.  Runs of 1-2 minutes, longest this am at 11:00 was 2 hours long. -on xarelto -s/p ablation in 2013 -not taking cardizem/metoprolol-- will ask Dr. Einar Gip to see as I am not sure what medications he should be taking  Prostate cancer -outpatient follow up  Family Communication/Anticipated D/C date and plan/Code Status   DVT prophylaxis: xarelto Code Status: Full Code.  Family Communication:  Disposition Plan:    Medical Consultants:    cards  Subjective:   Has been watching his 2 young grandchildren on the weekend and when they are there, he does not get much sleep/rest and this is taxing on him  Objective:    Vitals:   06/07/18 2353 06/08/18 0557 06/08/18 0850 06/08/18 1200  BP: 140/63   (!) 146/63  Pulse: 64   (!) 50  Resp: 18 17 18 18   Temp: 97.8 F (36.6 C) 98.1 F (36.7 C) 98 F (36.7 C) 97.8 F (36.6 C)  TempSrc:  Oral Oral Oral Oral  SpO2: 100% 98%  100%  Weight:      Height:        Intake/Output Summary (Last 24 hours) at 06/08/2018 1450 Last data filed at 06/08/2018 0900 Gross per 24 hour  Intake 1958.97 ml  Output 1400 ml  Net 558.97 ml   Filed Weights   06/07/18 1147 06/07/18 1839  Weight: 74.8 kg 74.2 kg    Exam: In bed, NAD rrr No wheezing +Bs, soft, NT Moves all 4 ext   Data Reviewed:   I have personally reviewed following labs and imaging studies:  Labs: Labs show the following:   Basic Metabolic Panel: Recent Labs  Lab 06/07/18 1357 06/07/18 1905 06/08/18 0547  NA 141  --  141  K 3.9  --  4.0  CL 109  --  113*  CO2 19*  --  21*  GLUCOSE 117*  --  117*  BUN 60*  --  43*  CREATININE 2.96*  --  1.95*  CALCIUM 9.2  --  8.4*  MG  --  1.9  --   PHOS  --  3.3  --    GFR Estimated Creatinine Clearance: 28.2 mL/min (A) (by C-G formula based on SCr of 1.95 mg/dL (H)). Liver Function Tests: No results for input(s): AST, ALT, ALKPHOS, BILITOT, PROT, ALBUMIN in the last 168 hours. No results for input(s): LIPASE, AMYLASE  in the last 168 hours. No results for input(s): AMMONIA in the last 168 hours. Coagulation profile No results for input(s): INR, PROTIME in the last 168 hours.  CBC: Recent Labs  Lab 06/07/18 1357 06/08/18 0547  WBC 6.6 6.9  NEUTROABS 3.6  --   HGB 13.2 11.3*  HCT 41.4 33.7*  MCV 94.7 91.6  PLT 222 194   Cardiac Enzymes: Recent Labs  Lab 06/07/18 1905 06/07/18 2225  TROPONINI <0.03 <0.03   BNP (last 3 results) No results for input(s): PROBNP in the last 8760 hours. CBG: No results for input(s): GLUCAP in the last 168 hours. D-Dimer: No results for input(s): DDIMER in the last 72 hours. Hgb A1c: No results for input(s): HGBA1C in the last 72 hours. Lipid Profile: No results for input(s): CHOL, HDL, LDLCALC, TRIG, CHOLHDL, LDLDIRECT in the last 72 hours. Thyroid function studies: No results for input(s): TSH, T4TOTAL, T3FREE,  THYROIDAB in the last 72 hours.  Invalid input(s): FREET3 Anemia work up: No results for input(s): VITAMINB12, FOLATE, FERRITIN, TIBC, IRON, RETICCTPCT in the last 72 hours. Sepsis Labs: Recent Labs  Lab 06/07/18 1357 06/08/18 0547  WBC 6.6 6.9    Microbiology No results found for this or any previous visit (from the past 240 hour(s)).  Procedures and diagnostic studies:  Dg Chest 2 View  Result Date: 06/07/2018 CLINICAL DATA:  83 year old male with increased weakness since yesterday, near syncope. EXAM: CHEST - 2 VIEW COMPARISON:  06/07/2014 chest radiographs and earlier. FINDINGS: Chronically large lung volumes are stable. Chronic left chest dual lead cardiac pacemaker is stable. Mediastinal contours remain normal. Visualized tracheal air column is within normal limits. No pneumothorax, pulmonary edema, pleural effusion or confluent pulmonary opacity. No acute osseous abnormality identified. Negative visible bowel gas pattern. IMPRESSION: Chronic hyperinflation.  No acute cardiopulmonary abnormality. Electronically Signed   By: Genevie Ann M.D.   On: 06/07/2018 17:33   US Renal  Result Date: 06/07/2018 CLINICAL DATA:  Acute kidney injury.  Elevated BUN and creatinine. EXAM: RENAL / URINARY TRACT ULTRASOUND COMPLETE COMPARISON:  Abdomen and pelvis CT dated 02/10/2017. FINDINGS: Right Kidney: Renal measurements: 7.6 x 4.8 x 4.4 cm = volume: 82 mL . Echogenicity within normal limits. No mass or hydronephrosis visualized. Left Kidney: Renal measurements: 9.5 x 4.9 x 4.6 cm = volume: 112 mL. Echogenicity within normal limits. No mass or hydronephrosis visualized. Bladder: Appears normal for degree of bladder distention. IMPRESSION: The right kidney remains smaller than the left kidney. Otherwise, normal examination. Electronically Signed   By: Claudie Revering M.D.   On: 06/07/2018 21:52    Medications:   . aspirin  81 mg Oral Daily  . atorvastatin  20 mg Oral q1800  . rivaroxaban  15 mg Oral Q  supper  . sertraline  25 mg Oral Daily  . tamsulosin  0.8 mg Oral QHS   Continuous Infusions: . sodium chloride 100 mL/hr at 06/08/18 0755     LOS: 0 days   Geradine Girt  [facility]   How to contact the [facility] Attending or Consulting provider [place] or covering provider during after hours [place], for this patient?  1. Check the care team in [facility] and look for a) attending/consulting [facility] provider listed and b) the [facility] team listed 2. Log into [email or web address] and use [place]'s universal password to access. If you do not have the password, please contact the hospital operator. 3. Locate the [facility] provider you are looking for under [facility]  and page to a number that you can be directly reached. 4. If you still have difficulty reaching the provider, please page the [facility] (Director on Call) for the Hospitalists listed on amion for assistance.  06/08/2018, 2:50 PM

## 2018-06-08 NOTE — Care Management Obs Status (Signed)
[facility] NOTIFICATION   Patient Details  Name: Lawarence Meek MRN: [id number] Date of Birth: 05/23/1935   Medicare Observation Status Notification Given:  Yes    Midge Minium RN, BSN, NCM-BC, ACM-RN [phone] 06/08/2018, 4:09 PM

## 2018-06-09 ENCOUNTER — Encounter (HOSPITAL_COMMUNITY)
Admission: EM | Disposition: A | Discharge status: Home / Self Care | Payer: Self-pay | Source: Home / Self Care | Attending: Emergency Medicine

## 2018-06-09 DIAGNOSIS — I4892 Unspecified atrial flutter: Secondary | ICD-10-CM | POA: Diagnosis not present

## 2018-06-09 DIAGNOSIS — I951 Orthostatic hypotension: Secondary | ICD-10-CM | POA: Diagnosis not present

## 2018-06-09 DIAGNOSIS — N179 Acute kidney failure, unspecified: Secondary | ICD-10-CM | POA: Diagnosis not present

## 2018-06-09 DIAGNOSIS — I10 Essential (primary) hypertension: Secondary | ICD-10-CM | POA: Diagnosis not present

## 2018-06-09 LAB — CBC
HCT: 36.8 % — ABNORMAL LOW (ref 39.0–52.0)
Hemoglobin: 12.5 g/dL — ABNORMAL LOW (ref 13.0–17.0)
MCH: 30.8 pg (ref 26.0–34.0)
MCHC: 34 g/dL (ref 30.0–36.0)
MCV: 90.6 fL (ref 80.0–100.0)
PLATELETS: 216 10*3/uL (ref 150–400)
RBC: 4.06 MIL/uL — ABNORMAL LOW (ref 4.22–5.81)
RDW: 13.3 % (ref 11.5–15.5)
WBC: 7 10*3/uL (ref 4.0–10.5)
nRBC: 0 % (ref 0.0–0.2)

## 2018-06-09 LAB — BASIC METABOLIC PANEL
Anion gap: 7 (ref 5–15)
BUN: 25 mg/dL — ABNORMAL HIGH (ref 8–23)
CO2: 23 mmol/L (ref 22–32)
Calcium: 8.9 mg/dL (ref 8.9–10.3)
Chloride: 112 mmol/L — ABNORMAL HIGH (ref 98–111)
Creatinine, Ser: 1.47 mg/dL — ABNORMAL HIGH (ref 0.61–1.24)
GFR calc Af Amer: 51 mL/min — ABNORMAL LOW (ref >60)
GFR, EST NON AFRICAN AMERICAN: 44 mL/min — AB (ref >60)
Glucose, Bld: 103 mg/dL — ABNORMAL HIGH (ref 70–99)
POTASSIUM: 3.8 mmol/L (ref 3.5–5.1)
Sodium: 142 mmol/L (ref 135–145)

## 2018-06-09 LAB — TSH: TSH: 0.884 u[IU]/mL (ref 0.350–4.500)

## 2018-06-09 LAB — ANTINUCLEAR ANTIBODIES, IFA: ANA Ab, IFA: NEGATIVE

## 2018-06-09 SURGERY — CARDIOVERSION
Anesthesia: General

## 2018-06-09 MED ORDER — METOPROLOL TARTRATE 25 MG PO TABS: 25.0000 mg | ORAL_TABLET | Freq: Two times a day (BID) | ORAL | 0 refills | Status: DC

## 2018-06-09 MED ORDER — XARELTO 20 MG PO TABS: 20.0000 mg | ORAL_TABLET | Freq: Every day | ORAL | 0 refills | Status: DC

## 2018-06-09 MED ORDER — FLECAINIDE ACETATE 50 MG PO TABS: 50.0000 mg | ORAL_TABLET | Freq: Two times a day (BID) | ORAL | 0 refills | Status: DC

## 2018-06-09 NOTE — Progress Notes (Addendum)
Subjective:  Feels much better   Objective:  Vital Signs in the last 24 hours: Temp:  [97.8 F (36.6 C)-98.3 F (36.8 C)] 97.8 F (36.6 C) (02/05 0515) Pulse Rate:  [50-64] 64 (02/05 0515) Resp:  [18] 18 (02/05 0515) BP: (146-185)/(63-87) 146/75 (02/05 0515) SpO2:  [95 %-100 %] 95 % (02/05 0515)  Intake/Output from previous day: 02/04 0701 - 02/05 0700 In: 1168.2 [P.O.:240; I.V.:928.2] Out: 1300 [Urine:1300] Intake/Output from this shift: No intake/output data recorded.  Physical Exam: Nursing note and vitals reviewed. Constitutional: He is oriented to person, place, and time. He appears well-developed and well-nourished. No distress.  HENT:  Head: Normocephalic and atraumatic.  Eyes: Pupils are equal, round, and reactive to light. Conjunctivae are normal.  Neck: No JVD present.  Cardiovascular: Normal rate, regular rhythm and normal heart sounds.  No murmur heard. Respiratory: Effort normal and breath sounds normal. He has no wheezes. He has no rales.  Musculoskeletal:        General: No edema.  Lymphadenopathy:    He has no cervical adenopathy.  Neurological: He is alert and oriented to person, place, and time. No cranial nerve deficit.  Skin: Skin is warm and dry.  Psychiatric: He has a normal mood and affect.   Lab Results: Recent Labs    06/08/18 0547 06/09/18 0216  WBC 6.9 7.0  HGB 11.3* 12.5*  PLT 194 216   Recent Labs    06/08/18 0547 06/09/18 0216  NA 141 142  K 4.0 3.8  CL 113* 112*  CO2 21* 23  GLUCOSE 117* 103*  BUN 43* 25*  CREATININE 1.95* 1.47*   Recent Labs    06/07/18 1905 06/07/18 2225  TROPONINI <0.03 <0.03   Cardiac studies: EKG 06/08/2018: Atrial paced rhythm  EKG 06/07/2018: Ectopic atrial rhythm with occasional PAC  Echocardiogram 06/08/2018: 1. The left ventricle has hyperdynamic systolic function of >63%. The cavity size is normal. There is no increased left ventricular wall thickness. Echo evidence of normal  diastolic filling patterns. Normal left ventricular filling pressures. 2. The aortic valve is tricuspid in structure. There is mild thickening and mild calcification of the aortic valve.  Assessment/Recommendations:  83 y/o [place] male with sick sinus syndrome s/p pacemaker, paroxysmal atrial flutter s/p prior ablation, currnelty on flecainide and anticaogulation.   Paroxysmal atrial flutter: CHA2DS2VAsc score 3, annual stroke risk 3.6% Multiple episodes of atrial flutter, longest occurring on 06/06/2018 lasting for 2 hr 34 min, with fastest episodes with V rate in 160s. I have not seen any Aflutter episodes on telemetry. He has atrial paced rhythm for the  most part, with occasional AV paced rhythm.   Continue flecainide at 50 mg bid with metoprolol tartarate 25 mg bid. May be able to discharge home on Xarelto 20 mg daily, given improvement in GFR and resolution of AKI.   Hypertension: Consider adding amlodipine.  Follow up with Dr. Einar Gip in 2 weeks.    LOS: 0 days    Manish J Patwardhan 06/09/2018, 8:08 AM  Manish Esther Hardy, MD [facility]. [place] Pager: [phone] Office: [phone] If no answer Cell [phone]

## 2018-06-09 NOTE — Discharge Summary (Signed)
Physician Discharge Summary  [name] [name] OHY:[id number] DOB: [birth year] DOA: 06/07/2018  PCP: [name], MD  Admit date: 06/07/2018 Discharge date: 06/09/2018  Admitted From: home Discharge disposition: home  Recommendations for Outpatient Follow-Up:   1. Consider addition of norvasc as a BP control agent if BP Still elevated as an outpateint   Discharge Diagnosis:   Active Problems:   AKI (acute kidney injury) ([place])   Atrial flutter (HCC)   Orthostatic hypotension    Discharge Condition: Improved.  Diet recommendation: Low sodium, heart healthy.  Wound care: None.  Code status: Full.   History of Present Illness:   Patient is an 83 year old African-American male, with past medical history significant for prostate cancer for which the patient has undergone radioactive treatment, and now on oral chemotherapeutic agent; thyroid disease, hypertension, pacemaker placement, hyperlipidemia, diverticulitis and atrial flutter status post cardioversion.  Patient also has chronic kidney disease.  Apparently, serum creatinine checked on November 27, 2013 in this hospital revealed serum creatinine of 1.24.  As per conversation with the patient's primary care provider's office, BUN was 19 and serum creatinine was 1.6 on 05/24/2018.  Patient is not a particularly good historian.  According to the patient, he woke up this morning not feeling well.  Patient could not or would not elaborate further about what he meant by not feeling well.  The patient went to the outpatient's Oncologist's office for blood draw and started perspiring after the blood draw according to the patient.  No headache, no neck pain, no chest pain, no shortness of breath, no fever or chills, no GI symptoms and no urinary symptoms.  Patient was said to be orthostatic on presentation, and looked volume depleted, with BUN of 60 and serum creatinine of 2.96.  Patient's initial systolic blood pressure on presentation was 77  mmHg.  Patient will be admitted for further assessment and management.   Hospital Course by Problem:   Orthostatic hypotension -d/c HCTZ -encourage fluid intake -BP improved with IVF  AKI -CR was 2.96  -improved with IVF  Atrial flutter -per pacemaker interrogation: St Jude called with quick report of device interrogation. States new onset runsn of a flutter with rapid V rate in 160's. Runs of 1-2 minutes, longest this am at 11:00 was 2 hours long. -on xarelto -s/p ablation in 2013 -appreciate cardiology consult: Continue flecainide at 50 mg bid with metoprolol tartarate 25 mg bid. May be able to discharge home on Xarelto 20 mg daily, given improvement in GFR and resolution of AKI.   Prostate cancer -outpatient follow up    Medical Consultants:   cards   Discharge Exam:   Vitals:   06/09/18 0820 06/09/18 0823  BP:    Pulse: 61 60  Resp:    Temp:    SpO2:     Vitals:   06/09/18 0816 06/09/18 0818 06/09/18 0820 06/09/18 0823  BP:      Pulse: (!) 59 76 61 60  Resp:      Temp:      TempSrc:      SpO2:      Weight:      Height:        General exam: Appears calm and comfortable.    The results of significant diagnostics from this hospitalization (including imaging, microbiology, ancillary and laboratory) are listed below for reference.     Procedures and Diagnostic Studies:   Dg Chest 2 View  Result Date: 06/07/2018 CLINICAL DATA:  83 year old male with  increased weakness since yesterday, near syncope. EXAM: CHEST - 2 VIEW COMPARISON:  06/07/2014 chest radiographs and earlier. FINDINGS: Chronically large lung volumes are stable. Chronic left chest dual lead cardiac pacemaker is stable. Mediastinal contours remain normal. Visualized tracheal air column is within normal limits. No pneumothorax, pulmonary edema, pleural effusion or confluent pulmonary opacity. No acute osseous abnormality identified. Negative visible bowel gas pattern. IMPRESSION: Chronic  hyperinflation.  No acute cardiopulmonary abnormality. Electronically Signed   By: Genevie Ann M.D.   On: 06/07/2018 17:33   US Renal  Result Date: 06/07/2018 CLINICAL DATA:  Acute kidney injury.  Elevated BUN and creatinine. EXAM: RENAL / URINARY TRACT ULTRASOUND COMPLETE COMPARISON:  Abdomen and pelvis CT dated 02/10/2017. FINDINGS: Right Kidney: Renal measurements: 7.6 x 4.8 x 4.4 cm = volume: 82 mL . Echogenicity within normal limits. No mass or hydronephrosis visualized. Left Kidney: Renal measurements: 9.5 x 4.9 x 4.6 cm = volume: 112 mL. Echogenicity within normal limits. No mass or hydronephrosis visualized. Bladder: Appears normal for degree of bladder distention. IMPRESSION: The right kidney remains smaller than the left kidney. Otherwise, normal examination. Electronically Signed   By: Claudie Revering M.D.   On: 06/07/2018 21:52     Labs:   Basic Metabolic Panel: Recent Labs  Lab 06/07/18 1357 06/07/18 1905 06/08/18 0547 06/09/18 0216  NA 141  --  141 142  K 3.9  --  4.0 3.8  CL 109  --  113* 112*  CO2 19*  --  21* 23  GLUCOSE 117*  --  117* 103*  BUN 60*  --  43* 25*  CREATININE 2.96*  --  1.95* 1.47*  CALCIUM 9.2  --  8.4* 8.9  MG  --  1.9  --   --   PHOS  --  3.3  --   --    GFR Estimated Creatinine Clearance: 37.5 mL/min (A) (by C-G formula based on SCr of 1.47 mg/dL (H)). Liver Function Tests: No results for input(s): AST, ALT, ALKPHOS, BILITOT, PROT, ALBUMIN in the last 168 hours. No results for input(s): LIPASE, AMYLASE in the last 168 hours. No results for input(s): AMMONIA in the last 168 hours. Coagulation profile No results for input(s): INR, PROTIME in the last 168 hours.  CBC: Recent Labs  Lab 06/07/18 1357 06/08/18 0547 06/09/18 0216  WBC 6.6 6.9 7.0  NEUTROABS 3.6  --   --   HGB 13.2 11.3* 12.5*  HCT 41.4 33.7* 36.8*  MCV 94.7 91.6 90.6  PLT 222 194 216   Cardiac Enzymes: Recent Labs  Lab 06/07/18 1905 06/07/18 2225  TROPONINI <0.03 <0.03    BNP: Invalid input(s): POCBNP CBG: No results for input(s): GLUCAP in the last 168 hours. D-Dimer No results for input(s): DDIMER in the last 72 hours. Hgb A1c No results for input(s): HGBA1C in the last 72 hours. Lipid Profile No results for input(s): CHOL, HDL, LDLCALC, TRIG, CHOLHDL, LDLDIRECT in the last 72 hours. Thyroid function studies Recent Labs    06/09/18 0216  TSH 0.884   Anemia work up No results for input(s): VITAMINB12, FOLATE, FERRITIN, TIBC, IRON, RETICCTPCT in the last 72 hours. Microbiology No results found for this or any previous visit (from the past 240 hour(s)).   Discharge Instructions:   Discharge Instructions    Diet - low sodium heart healthy   Complete by:  As directed    Discharge instructions   Complete by:  As directed    Be sure to stay hydrated and  get plenty of rest at night for your heart   Increase activity slowly   Complete by:  As directed      Allergies as of 06/09/2018      Reactions   Xarelto [rivaroxaban] Other (See Comments)   "I didn't like the way it made me feel" (patient didn't elaborate)      Medication List    STOP taking these medications   gentian violet 1 % topical solution Commonly known as:  GNP GENTIAN VIOLET   HYDROcodone-acetaminophen 5-325 MG tablet Commonly known as:  NORCO/VICODIN   olmesartan 20 MG tablet Commonly known as:  BENICAR   terbinafine 1 % cream Commonly known as:  LAMISIL AT     TAKE these medications   atorvastatin 20 MG tablet Commonly known as:  LIPITOR Take 20 mg by mouth daily at 12 noon.   flecainide 50 MG tablet Commonly known as:  TAMBOCOR Take 1 tablet (50 mg total) by mouth every 12 (twelve) hours.   metoprolol tartrate 25 MG tablet Commonly known as:  LOPRESSOR Take 1 tablet (25 mg total) by mouth 2 (two) times daily. What changed:    medication strength  how much to take   tamsulosin 0.4 MG Caps capsule Commonly known as:  FLOMAX Take 0.4 mg by mouth at  bedtime.   XARELTO 20 MG Tabs tablet Generic drug:  rivaroxaban Take 1 tablet (20 mg total) by mouth daily with supper. What changed:    how much to take  how to take this  when to take this   XTANDI 40 MG capsule Generic drug:  enzalutamide Take 80 mg by mouth 2 (two) times daily. Take 80 mg by mouth two times a day- 5 PM and 9 PM      Follow-up Information    Adrian Prows, MD Follow up.   Specialty:  Cardiology Why:  Office will call patient to make an appt Contact information: [place] [address] [phone]        [name], MD Follow up in 1 week(s).   Specialty:  Internal Medicine Contact information: [address] [place] [address] [phone]            Time coordinating discharge: 25 min  Signed:  Geradine Girt DO  [facility] 06/09/2018, 9:18 AM

## 2018-06-17 ENCOUNTER — Ambulatory Visit (INDEPENDENT_AMBULATORY_CARE_PROVIDER_SITE_OTHER): Payer: Medicare Other | Admitting: Cardiology

## 2018-06-17 ENCOUNTER — Encounter: Payer: Self-pay | Admitting: Cardiology

## 2018-06-17 VITALS — BP 170/96 | HR 81 | Ht 67.0 in | Wt 169.3 lb

## 2018-06-17 DIAGNOSIS — Z950 Presence of cardiac pacemaker: Secondary | ICD-10-CM

## 2018-06-17 DIAGNOSIS — I10 Essential (primary) hypertension: Secondary | ICD-10-CM

## 2018-06-17 DIAGNOSIS — I4892 Unspecified atrial flutter: Secondary | ICD-10-CM

## 2018-06-17 DIAGNOSIS — I495 Sick sinus syndrome: Secondary | ICD-10-CM

## 2018-06-17 DIAGNOSIS — Z45018 Encounter for adjustment and management of other part of cardiac pacemaker: Secondary | ICD-10-CM

## 2018-06-17 HISTORY — DX: Encounter for adjustment and management of other part of cardiac pacemaker: Z45.018

## 2018-06-17 MED ORDER — AMLODIPINE BESYLATE 5 MG PO TABS: 5.0000 mg | ORAL_TABLET | Freq: Every day | ORAL | 2 refills | Status: DC

## 2018-06-17 NOTE — Progress Notes (Signed)
Subjective:  Primary Physician:  [name], MD  Patient ID: [name] [name], male    DOB: January 05, 1936, 83 y.o.   MRN: [id number]  Chief Complaint  Patient presents with  . Irregular Heart Beat  . Medication Problem  . Tachycardia    HPI: [name] [name]  is a 83 y.o. male  with history of paroxysmal atrial flutter status post ablation and sick sinus syndrome and presently has a pacemaker implantation since 06/16/2011 and is pacer dependant. He had paroxysmal episodes of sustained atrial fibrillation, first episode on 07/18/14 and placed on Flecainide and anticoagulation. He also has PACs and atrial tachycardia on pacer transmissions. No recent A. Flutter or Fib. His PMH significant for HTN with stage 3 CKD, hyperlipidemia, hypovitaminosis D and h/o partial colectomy due to multiple polyps.  Patient was admitted to [facility] on 07/05/2018 with generalized malaise symptoms. He was found to be orthostatic and volume depleted, and being rehydrated appropriately. His cardizem and metoprolol doses were decreased. He was thought to be on flecainide but was not. It was restarted due to paroxysmal A. Flutter with RVR. He now presents for f/u.  Patient admits to increased shortness of breath and weakness in legs prior to hospital admission, but currently denies symptoms. Patient admits to increased diaphoresis and night sweats due to cancer.   Past Medical History:  Diagnosis Date  . AKI (acute kidney injury) ([place]) 06/08/2018  . Atrial flutter ([place])   . Carcinoma of prostate ([place]) 12/30/2011   Treated with seed/radiation therapy.   . Colostomy in place [facility])   . Difficulty sleeping    lives in shelter currently  . Diverticulitis   . Dyslipidemia 12/30/2011  . Encounter for care of pacemaker 06/17/2018  . Frequency of urination   . Heart murmur   . Hypertension 04/30/11   Cardioversion 06/16/11  . Nocturia   . Pacemaker 7/13    sick sinus syndrome/St Jude pacemaker  . Paroxysmal atrial  flutter ([place]) 06/16/2011  . Prostate cancer ([place])   . Syncope and collapse 11/20/2011   Atrial and ventricular standstill > 3 seconds.   . Thyroid disease    had "overactive thyroid in 1997" - no known problem since    Past Surgical History:  Procedure Laterality Date  . ATRIAL FLUTTER ABLATION N/A 11/21/2011   Procedure: ATRIAL FLUTTER ABLATION;  Surgeon: Thompson Grayer, MD;  Location: [facility] CATH LAB;  Service: Cardiovascular;  Laterality: N/A;  . CARDIAC ELECTROPHYSIOLOGY STUDY AND ABLATION  7/13  . CARDIOVERSION  06/16/2011   Procedure: CARDIOVERSION;  Surgeon: Laverda Page, MD;  Location: [place];  Service: Cardiovascular;  Laterality: N/A;  . COLON SURGERY  05/13/2013  . COLOSTOMY CLOSURE N/A 11/25/2013   Procedure: COLOSTOMY CLOSURE;  Surgeon: Earnstine Regal, MD;  Location: [facility] ORS;  Service: General;  Laterality: N/A;  . COLOSTOMY CLOSURE  11/25/2013  . PACEMAKER INSERTION  11/28/11   SJM Accent DR RF implanted by Dr Rayann Heman  . PARTIAL COLECTOMY N/A 05/13/2013   Procedure: sigmoid COLECTOMY  colostomy ;  Surgeon: Earnstine Regal, MD;  Location: [facility] ORS;  Service: General;  Laterality: N/A;  . PERMANENT PACEMAKER INSERTION N/A 11/28/2011   Procedure: PERMANENT PACEMAKER INSERTION;  Surgeon: Thompson Grayer, MD;  Location[facility] CATH LAB;  Service: Cardiovascular;  Laterality: N/A;  . RADIOACTIVE SEED IMPLANT    . TONSILLECTOMY      Social History   Socioeconomic History  . Marital status: Single    Spouse name: Not on  file  . Number of children: Not on file  . Years of education: Not on file  . Highest education level: Not on file  Occupational History  . Not on file  Social Needs  . Financial resource strain: Not on file  . Food insecurity:    Worry: Not on file    Inability: Not on file  . Transportation needs:    Medical: Not on file    Non-medical: Not on file  Tobacco Use  . Smoking status: Former Smoker    Years: 50.00    Last attempt to quit: 07/20/2009    Years since quitting:  8.9  . Smokeless tobacco: Never Used  Substance and Sexual Activity  . Alcohol use: No  . Drug use: No  . Sexual activity: Never  Lifestyle  . Physical activity:    Days per week: Not on file    Minutes per session: Not on file  . Stress: Not on file  Relationships  . Social connections:    Talks on phone: Not on file    Gets together: Not on file    Attends religious service: Not on file    Active member of club or organization: Not on file    Attends meetings of clubs or organizations: Not on file    Relationship status: Not on file  . Intimate partner violence:    Fear of current or ex partner: Not on file    Emotionally abused: Not on file    Physically abused: Not on file    Forced sexual activity: Not on file  Other Topics Concern  . Not on file  Social History Narrative  . Not on file    Current Outpatient Medications on File Prior to Visit  Medication Sig Dispense Refill  . atorvastatin (LIPITOR) 20 MG tablet Take 20 mg by mouth daily at 12 noon.     . flecainide (TAMBOCOR) 50 MG tablet Take 1 tablet (50 mg total) by mouth every 12 (twelve) hours. 60 tablet 0  . metoprolol tartrate (LOPRESSOR) 25 MG tablet Take 1 tablet (25 mg total) by mouth 2 (two) times daily. 60 tablet 0  . Tamsulosin HCl (FLOMAX) 0.4 MG CAPS Take 0.4 mg by mouth at bedtime.     Alveda Reasons 20 MG TABS tablet Take 1 tablet (20 mg total) by mouth daily with supper. 30 tablet 0  . XTANDI 40 MG capsule Take 80 mg by mouth 2 (two) times daily. Take 80 mg by mouth two times a day- 5 PM and 9 PM     No current facility-administered medications on file prior to visit.     Review of Systems  Constitutional: Negative for diaphoresis, fever, malaise/fatigue and weight loss.  Respiratory: Negative for hemoptysis, shortness of breath and wheezing.   Cardiovascular: Negative for chest pain, palpitations, orthopnea, claudication and leg swelling.  Gastrointestinal: Negative for abdominal pain, constipation  and nausea.  Genitourinary: Negative for dysuria.  Neurological: Negative for dizziness, loss of consciousness and weakness.  Endo/Heme/Allergies: Does not bruise/bleed easily.  Psychiatric/Behavioral: Negative for depression. The patient is not nervous/anxious.       Objective:  Blood pressure (!) 170/96, pulse 81, height '5\' 7"'  (1.702 m), weight 169 lb 4.8 oz (76.8 kg), SpO2 99 %.  Physical Exam  Constitutional: He appears well-developed and well-nourished. No distress.  HENT:  Head: Atraumatic.  Eyes: Conjunctivae are normal.  Neck: Neck supple. No JVD present. No thyromegaly present.  Cardiovascular: Normal rate, regular rhythm, normal  heart sounds and intact distal pulses. Exam reveals no gallop.  No murmur heard. Pulses:      Carotid pulses are 2+ on the right side and 2+ on the left side.      Radial pulses are 2+ on the right side and 2+ on the left side.       Femoral pulses are 2+ on the right side and 2+ on the left side.      Popliteal pulses are 2+ on the right side and 2+ on the left side.       Dorsalis pedis pulses are 1+ on the right side and 1+ on the left side.       Posterior tibial pulses are 1+ on the right side and 1+ on the left side.  Pulmonary/Chest: Effort normal and breath sounds normal.  Abdominal: Soft. Bowel sounds are normal.  Musculoskeletal: Normal range of motion.        General: No edema.  Neurological: He is alert.  Skin: Skin is warm and dry.  Psychiatric: He has a normal mood and affect.    CARDIAC STUDIES:  Echocardiogram 06/08/2018:  1. The left ventricle has hyperdynamic systolic function of >97%. The cavity size is normal. There is no increased left ventricular wall thickness. Echo evidence of normal diastolic filling patterns. Normal left ventricular filling pressures.  2. The aortic valve is tricuspid in structure. There is mild thickening and mild calcification of the aortic valve.  Assessment & Recommendations:   1. Sick sinus  syndrome (HCC) S/P permanent pacemaker implantationwith St. Jude dual-chamber pacemaker in 2013.  2. Paroxysmal atrial flutter and fibrillation CHA2DS2-VASc Score is 3.0.  -(CHF; HTN; vasc disease DM,  Male = 1; Age <65 =0; 65-74 = 1,  >75 =2; stroke = 2).  -(Yearly risk of stroke: Score of 1=1.3; 2=2.2; 3=3.2; 4=4; 5=6.7; 6=9.8; 7=>9.8)   3. Hypertension with stage 3 CKD BMP Latest Ref Rng & Units 06/09/2018 06/08/2018 06/07/2018  Glucose 70 - 99 mg/dL 103(H) 117(H) 117(H)  BUN 8 - 23 mg/dL 25(H) 43(H) 60(H)  Creatinine 0.61 - 1.24 mg/dL 1.47(H) 1.95(H) 2.96(H)  Sodium 135 - 145 mmol/L 142 141 141  Potassium 3.5 - 5.1 mmol/L 3.8 4.0 3.9  Chloride 98 - 111 mmol/L 112(H) 113(H) 109  CO2 22 - 32 mmol/L 23 21(L) 19(L)  Calcium 8.9 - 10.3 mg/dL 8.9 8.4(L) 9.2    Labs: 02/22/2018: Creatinine 1.36, EGFR 49/57, potassium 4.2, BMP normal.  Labs 03/30/2017: Serum glucose 91 mg, BUN 13, creatinine 1.24, eGFR 64 mL, potassium 4.5.  HB 13.7/HCT 41.8, platelets 230.  Labs 0 07/25/2016: Potassium 4.1, BUN 13, creatinine 1.17, eGFR 69 mL, CBC normal, total cholesterol 150, triglycerides 111, HDL 65, LDL 63.  TSH normal.   3. Pacemaker-St.Jude S/P PTVP for sick sinus syndrome  11/28/11: St. Jude Medical Accent.03/18/13: 5 minutes of A. Fib  Scheduled in person pacemaker check 03/22/2018: AP/VS 60 bpm, pacer dependent with underlying escape at 39 bpm.  Frequent AMS episodes, 80/EF burden 2.4%, EGM's reveal brief atrial tachycardia.  Normal thresholds, longevity > 4.5 years.  Scheduled Remote Pacemaker transmission 05/03/2018:  There were 443 atrial high rate episodes detected. The longest lasted 0:00:03:14 in duration. There was a <1 % cumulative atrial arrhythmia burden. EGMs show non-conducted PATs and some false AMS/func loss of A. capture. One PMT detected with EGM showing upper rate behavior. No VHR episodes. Health trends do not demonstrate significant abnormality. Battery longevity is 5.1 years. RA  pacing is 98 %,  RV pacing is 4.0 %.  Episodic transmission 10/31/2016: 09/19/2016: Frequent mode switches, longest 13.33 minutes with A. Fib with rapid ventricular response at the rate of 150 bpm, otherwise < 27mn of mode switches (Burden <1%).  Normal pacemaker function.  Recommendation: patient is presently taking flecainide, continue the same.  His blood pressure is not well controlled, previously was on diltiazem and metoprolol, as he was not on any flecainide previously, I do not want use 3 negative chronotropic agents.  Hence was started him on amlodipine 5 mg daily, his hospital admission was related to dehydration as patient has been diagnosed with prostate cancer and is undergoing chemotherapy since October 2019 and has responded well.  He is aware to keep himself well-hydrated.  I'd like to see him back in 4-6 weeks for follow-up of hypertension.  Continued remote monitoring of his pacemaker.  Continue anti-ablation.  JAdrian Prows MD, [facility]2/13/2020, 8:10 AM [facility]. POh[place]owaPager: [phone] Office: [phone] no answer Cell [phone]

## 2018-07-12 NOTE — Progress Notes (Deleted)
Subjective:  Primary Physician:  [name], MD  Patient ID: [name] [name], male    DOB: May 08, 1935, 83 y.o.   MRN: [id number]  No chief complaint on file.   HPI: [name] [name]  is a 83 y.o. male  with history of paroxysmal atrial flutter status post ablation and sick sinus syndrome and presently has a pacemaker implantation since 06/16/2011 and is pacer dependant. He had paroxysmal episodes of sustained atrial fibrillation, first episode on 07/18/14 and placed on Flecainide and anticoagulation. He also has PACs and atrial tachycardia on pacer transmissions. No recent A. Flutter or Fib. His PMH significant for HTN with stage 3 CKD, hyperlipidemia, hypovitaminosis D and h/o partial colectomy due to multiple polyps.  Patient was admitted to [facility] on 07/05/2018 with generalized malaise symptoms. He was found to be orthostatic and volume depleted, and being rehydrated appropriately. His cardizem and metoprolol doses were decreased. He was thought to be on flecainide but was not. It was restarted due to paroxysmal A. Flutter with RVR. He now presents for f/u.  Patient admits to increased shortness of breath and weakness in legs prior to hospital admission, but currently denies symptoms. Patient admits to increased diaphoresis and night sweats due to cancer.   Past Medical History:  Diagnosis Date  . AKI (acute kidney injury) ([place]) 06/08/2018  . Atrial flutter ([place])   . Carcinoma of prostate ([place]) 12/30/2011   Treated with seed/radiation therapy.   . Colostomy in place [facility])   . Difficulty sleeping    lives in shelter currently  . Diverticulitis   . Dyslipidemia 12/30/2011  . Encounter for care of pacemaker 06/17/2018  . Frequency of urination   . Heart murmur   . Hypertension 04/30/11   Cardioversion 06/16/11  . Near syncope 06/07/2018  . Nocturia   . Orthostatic hypotension 06/08/2018  . Pacemaker 7/13    sick sinus syndrome/St Jude pacemaker  . Paroxysmal atrial flutter ([place])  06/16/2011  . Prostate cancer ([place])   . Syncope and collapse 11/20/2011   Atrial and ventricular standstill > 3 seconds.   . Thyroid disease    had "overactive thyroid in 1997" - no known problem since    Past Surgical History:  Procedure Laterality Date  . ATRIAL FLUTTER ABLATION N/A 11/21/2011   Procedure: ATRIAL FLUTTER ABLATION;  Surgeon: Thompson Grayer, MD;  Location: [facility] CATH LAB;  Service: Cardiovascular;  Laterality: N/A;  . CARDIAC ELECTROPHYSIOLOGY STUDY AND ABLATION  7/13  . CARDIOVERSION  06/16/2011   Procedure: CARDIOVERSION;  Surgeon: Laverda Page, MD;  Location: [place];  Service: Cardiovascular;  Laterality: N/A;  . COLON SURGERY  05/13/2013  . COLOSTOMY CLOSURE N/A 11/25/2013   Procedure: COLOSTOMY CLOSURE;  Surgeon: Earnstine Regal, MD;  [facility];  Service: General;  Laterality: N/A;  . COLOSTOMY CLOSURE  11/25/2013  . PACEMAKER INSERTION  11/28/11   SJM Accent DR RF implanted by Dr Rayann Heman  . PARTIAL COLECTOMY N/A 05/13/2013   Procedure: sigmoid COLECTOMY  colostomy ;  Surgeon: Earnstine Regal, MD;  Location: [facility] ORS;  Service: General;  Laterality: N/A;  . PERMANENT PACEMAKER INSERTION N/A 11/28/2011   Procedure: PERMANENT PACEMAKER INSERTION;  Surgeon: Thompson Grayer, MD;  Location: [facility] CATH LAB;  Service: Cardiovascular;  Laterality: N/A;  . RADIOACTIVE SEED IMPLANT    . TONSILLECTOMY      Social History   Socioeconomic History  . Marital status: Single    Spouse name: Not on file  . Number  of children: 6  . Years of education: Not on file  . Highest education level: Not on file  Occupational History  . Not on file  Social Needs  . Financial resource strain: Not on file  . Food insecurity:    Worry: Not on file    Inability: Not on file  . Transportation needs:    Medical: Not on file    Non-medical: Not on file  Tobacco Use  . Smoking status: Former Smoker    Packs/day: 0.50    Years: 50.00    Pack years: 25.00    Types: Cigarettes    Last attempt to  quit: 07/20/2009    Years since quitting: 8.9  . Smokeless tobacco: Never Used  Substance and Sexual Activity  . Alcohol use: No  . Drug use: No  . Sexual activity: Never  Lifestyle  . Physical activity:    Days per week: Not on file    Minutes per session: Not on file  . Stress: Not on file  Relationships  . Social connections:    Talks on phone: Not on file    Gets together: Not on file    Attends religious service: Not on file    Active member of club or organization: Not on file    Attends meetings of clubs or organizations: Not on file    Relationship status: Not on file  . Intimate partner violence:    Fear of current or ex partner: Not on file    Emotionally abused: Not on file    Physically abused: Not on file    Forced sexual activity: Not on file  Other Topics Concern  . Not on file  Social History Narrative  . Not on file    Current Outpatient Medications on File Prior to Visit  Medication Sig Dispense Refill  . amLODipine (NORVASC) 5 MG tablet Take 1 tablet (5 mg total) by mouth daily for 30 days. 30 tablet 2  . atorvastatin (LIPITOR) 20 MG tablet Take 20 mg by mouth daily at 12 noon.     . flecainide (TAMBOCOR) 50 MG tablet Take 1 tablet (50 mg total) by mouth every 12 (twelve) hours. 60 tablet 0  . metoprolol tartrate (LOPRESSOR) 25 MG tablet Take 1 tablet (25 mg total) by mouth 2 (two) times daily. 60 tablet 0  . Tamsulosin HCl (FLOMAX) 0.4 MG CAPS Take 0.4 mg by mouth at bedtime.     Alveda Reasons 20 MG TABS tablet Take 1 tablet (20 mg total) by mouth daily with supper. 30 tablet 0  . XTANDI 40 MG capsule Take 80 mg by mouth 2 (two) times daily. Take 80 mg by mouth two times a day- 5 PM and 9 PM     No current facility-administered medications on file prior to visit.     Review of Systems  Constitutional: Negative for diaphoresis, fever, malaise/fatigue and weight loss.  Respiratory: Negative for hemoptysis, shortness of breath and wheezing.   Cardiovascular:  Negative for chest pain, palpitations, orthopnea, claudication and leg swelling.  Gastrointestinal: Negative for abdominal pain, constipation and nausea.  Genitourinary: Negative for dysuria.  Neurological: Negative for dizziness, loss of consciousness and weakness.  Endo/Heme/Allergies: Does not bruise/bleed easily.  Psychiatric/Behavioral: Negative for depression. The patient is not nervous/anxious.       Objective:  There were no vitals taken for this visit.  Physical Exam  Constitutional: He appears well-developed and well-nourished. No distress.  HENT:  Head: Atraumatic.  Eyes: Conjunctivae  are normal.  Neck: Neck supple. No JVD present. No thyromegaly present.  Cardiovascular: Normal rate, regular rhythm, normal heart sounds and intact distal pulses. Exam reveals no gallop.  No murmur heard. Pulses:      Carotid pulses are 2+ on the right side and 2+ on the left side.      Radial pulses are 2+ on the right side and 2+ on the left side.       Femoral pulses are 2+ on the right side and 2+ on the left side.      Popliteal pulses are 2+ on the right side and 2+ on the left side.       Dorsalis pedis pulses are 1+ on the right side and 1+ on the left side.       Posterior tibial pulses are 1+ on the right side and 1+ on the left side.  Pulmonary/Chest: Effort normal and breath sounds normal.  Abdominal: Soft. Bowel sounds are normal.  Musculoskeletal: Normal range of motion.        General: No edema.  Neurological: He is alert.  Skin: Skin is warm and dry.  Psychiatric: He has a normal mood and affect.    CARDIAC STUDIES:  Echocardiogram 06/08/2018:  1. The left ventricle has hyperdynamic systolic function of >20%. The cavity size is normal. There is no increased left ventricular wall thickness. Echo evidence of normal diastolic filling patterns. Normal left ventricular filling pressures.  2. The aortic valve is tricuspid in structure. There is mild thickening and mild  calcification of the aortic valve.  Assessment & Recommendations:   1. Sick sinus syndrome (HCC) S/P permanent pacemaker implantationwith St. Jude dual-chamber pacemaker in 2013.  2. Paroxysmal atrial flutter and fibrillation CHA2DS2-VASc Score is 3.0.  -(CHF; HTN; vasc disease DM,  Male = 1; Age <65 =0; 65-74 = 1,  >75 =2; stroke = 2).  -(Yearly risk of stroke: Score of 1=1.3; 2=2.2; 3=3.2; 4=4; 5=6.7; 6=9.8; 7=>9.8)   3. Hypertension with stage 3 CKD BMP Latest Ref Rng & Units 06/09/2018 06/08/2018 06/07/2018  Glucose 70 - 99 mg/dL 103(H) 117(H) 117(H)  BUN 8 - 23 mg/dL 25(H) 43(H) 60(H)  Creatinine 0.61 - 1.24 mg/dL 1.47(H) 1.95(H) 2.96(H)  Sodium 135 - 145 mmol/L 142 141 141  Potassium 3.5 - 5.1 mmol/L 3.8 4.0 3.9  Chloride 98 - 111 mmol/L 112(H) 113(H) 109  CO2 22 - 32 mmol/L 23 21(L) 19(L)  Calcium 8.9 - 10.3 mg/dL 8.9 8.4(L) 9.2    Labs: 02/22/2018: Creatinine 1.36, EGFR 49/57, potassium 4.2, BMP normal.  Labs 03/30/2017: Serum glucose 91 mg, BUN 13, creatinine 1.24, eGFR 64 mL, potassium 4.5.  HB 13.7/HCT 41.8, platelets 230.  Labs 0 07/25/2016: Potassium 4.1, BUN 13, creatinine 1.17, eGFR 69 mL, CBC normal, total cholesterol 150, triglycerides 111, HDL 65, LDL 63.  TSH normal.   3. Pacemaker-St.Jude S/P PTVP for sick sinus syndrome  11/28/11: St. Jude Medical Accent.03/18/13: 5 minutes of A. Fib  Scheduled in person pacemaker check 03/22/2018: AP/VS 60 bpm, pacer dependent with underlying escape at 39 bpm.  Frequent AMS episodes, 80/EF burden 2.4%, EGM's reveal brief atrial tachycardia.  Normal thresholds, longevity > 4.5 years.  Scheduled Remote Pacemaker transmission 05/03/2018:  There were 443 atrial high rate episodes detected. The longest lasted 0:00:03:14 in duration. There was a <1 % cumulative atrial arrhythmia burden. EGMs show non-conducted PATs and some false AMS/func loss of A. capture. One PMT detected with EGM showing upper rate behavior. No VHR  episodes. Health  trends do not demonstrate significant abnormality. Battery longevity is 5.1 years. RA pacing is 98 %, RV pacing is 4.0 %.  Episodic transmission 10/31/2016: 09/19/2016: Frequent mode switches, longest 13.33 minutes with A. Fib with rapid ventricular response at the rate of 150 bpm, otherwise < 23mn of mode switches (Burden <1%).  Normal pacemaker function.  Recommendation: Patient is presently taking flecainide, continue the same.  His blood pressure is not well controlled, previously was on diltiazem and metoprolol, as he was not on any flecainide previously, I do not want use 3 negative chronotropic agents.  Hence was started him on amlodipine 5 mg daily, his hospital admission was related to dehydration as patient has been diagnosed with prostate cancer and is undergoing chemotherapy since October 2019 and has responded well.  He is aware to keep himself well-hydrated.  I'd like to see him back in 4-6 weeks for follow-up of hypertension.  Continued remote monitoring of his pacemaker.  Continue anti-ablation.   07/12/2018, 2:14 PM PPort GibsonCardiovascular. PSocasteePager: [phone] Office: [phone] no answer Cell [phone]

## 2018-07-14 ENCOUNTER — Ambulatory Visit: Payer: Self-pay | Admitting: Cardiology

## 2018-07-15 ENCOUNTER — Ambulatory Visit (INDEPENDENT_AMBULATORY_CARE_PROVIDER_SITE_OTHER): Payer: Medicare Other | Admitting: Cardiology

## 2018-07-15 ENCOUNTER — Other Ambulatory Visit: Payer: Self-pay

## 2018-07-15 ENCOUNTER — Encounter: Payer: Self-pay | Admitting: Cardiology

## 2018-07-15 DIAGNOSIS — I10 Essential (primary) hypertension: Secondary | ICD-10-CM

## 2018-07-15 MED ORDER — AMLODIPINE BESYLATE 5 MG PO TABS: 5.0000 mg | ORAL_TABLET | Freq: Every day | ORAL | 3 refills | Status: DC

## 2018-07-15 NOTE — Progress Notes (Signed)
Subjective:  Primary Physician:  [name], MD  Patient ID: [name] [name], male    DOB: 1935/09/16, 83 y.o.   MRN: [id number]  Chief Complaint  Patient presents with  . Atrial Fibrillation  . Follow-up    20mo   HPI: [name] [name] is a 83y.o. male  with history of paroxysmal atrial flutter status post ablation and sick sinus syndrome and presently has a pacemaker implantation since 06/16/2011 and is pacer dependant. He had paroxysmal episodes of sustained atrial fibrillation, first episode on 07/18/14 and placed on Flecainide and anticoagulation. He also has PACs and atrial tachycardia on pacer transmissions. No recent A. Flutter or Fib.  His PMH significant for HTN with stage 3 CKD, hyperlipidemia, hypovitaminosis D and h/o partial colectomy due to multiple polyps.  Patient last admitted the beginning of March 2020 with dehydration, multiple medications were discontinued.  On his last office visit with me about 3 to 4 weeks ago, I had added amlodipine for uncontrolled hypertension, he now presents for follow-up.  He has no new symptoms.  States that he is tolerating the medication well.  He has not had any palpitations, dizziness or syncope.   Past Medical History:  Diagnosis Date  . [place]) ([place] 06/[place]  . [place]   . Carcin[place] of [place] ([place] 12/30/2011   Treated with seed/radiation therapy.   . Col[facility] in place ([facility]   . Difficulty sleeping    lives in shelter currently  . Diverticulitis   . Dyslipidemia 12/30/2011  . Encounter for care of pacemaker 06/17/2018  . Frequency of urination   . Heart murmur   . Hypertension 04/30/11   Cardioversion 06/16/11  . Near syncope 06/07/2018  . Nocturia   . Orthostatic hypotension 06/08/2018  . Pacemaker 7/13    sick sinus syndrome/St Jude pacemaker  . [place]ox[place] ([place] 06/16/2011  . [place] ([place]   . Syncope and collapse 11/20/2011   Atrial and ventricular standstill > 3  seconds.   . Thyroid disease    had "overactive thyroid in 1997" - no known problem since    Past Surgical History:  Procedure Laterality Date  . ATRIAL FLUTTER ABLATION N/A 11/21/2011   Procedure: ATRIAL FLUTTER ABLATION;  Surgeon: JThompson Grayer MD;  [facility];  Service: Cardiovascular;  Laterality: N/A;  . CARDIAC ELECTROPHYSIOLOGY STUDY AND ABLATION  7/13  . CARDIOVERSION  06/16/2011   Procedure: CARDIOVERSION;  Surgeon: JLaverda Page MD;  [place]  Service: Cardiovascular;  Laterality: N/A;  . COLON SURGERY  05/13/2013  . COLOSTOMY CLOSURE N/A 11/25/2013   Procedure: COLOSTOMY CLOSURE;  Surgeon: TEarnstine Regal MD;  [facility];  Service: General;  Laterality: N/A;  . COLOSTOMY CLOSURE  11/25/2013  . PACEMAKER INSERTION  11/28/11   SJM Accent DR RF implanted by Dr ARayann Heman . PARTIAL COLECTOMY N/A 05/13/2013   Procedure: sigmoid COLECTOMY  colostomy ;  Surgeon: TEarnstine Regal MD;  [facility];  Service: General;  Laterality: N/A;  . PERMANENT PACEMAKER INSERTION N/A 11/28/2011   Procedure: PERMANENT PACEMAKER INSERTION;  Surgeon: JThompson Grayer MD;  [facility];  Service: Cardiovascular;  Laterality: N/A;  . RADIOACTIVE SEED IMPLANT    . TONSILLECTOMY      Social History   Socioeconomic History  . Marital status: Single    Spouse name: Not on file  . Number of children: 6  . Years of education: Not on  file  . Highest education level: Not on file  Occupational History  . Not on file  Social Needs  . Financial resource strain: Not on file  . Food insecurity:    Worry: Not on file    Inability: Not on file  . Transportation needs:    Medical: Not on file    Non-medical: Not on file  Tobacco Use  . Smoking status: Former Smoker    Packs/day: 0.50    Years: 50.00    Pack years: 25.00    Types: Cigarettes    Last attempt to quit: 07/20/2009    Years since quitting: 8.9  . Smokeless tobacco: Never Used  Substance and Sexual Activity  .  Alcohol use: No  . Drug use: No  . Sexual activity: Never  Lifestyle  . Physical activity:    Days per week: Not on file    Minutes per session: Not on file  . Stress: Not on file  Relationships  . Social connections:    Talks on phone: Not on file    Gets together: Not on file    Attends religious service: Not on file    Active member of club or organization: Not on file    Attends meetings of clubs or organizations: Not on file    Relationship status: Not on file  . Intimate partner violence:    Fear of current or ex partner: Not on file    Emotionally abused: Not on file    Physically abused: Not on file    Forced sexual activity: Not on file  Other Topics Concern  . Not on file  Social History Narrative  . Not on file    Current Outpatient Medications on File Prior to Visit  Medication Sig Dispense Refill  . amLODipine (NORVASC) 5 MG tablet Take 1 tablet (5 mg total) by mouth daily for 30 days. 30 tablet 2  . atorvastatin (LIPITOR) 20 MG tablet Take 20 mg by mouth daily at 12 noon.     . flecainide (TAMBOCOR) 50 MG tablet Take 1 tablet (50 mg total) by mouth every 12 (twelve) hours. 60 tablet 0  . metoprolol tartrate (LOPRESSOR) 25 MG tablet Take 1 tablet (25 mg total) by mouth 2 (two) times daily. 60 tablet 0  . Tamsulosin HCl (FLOMAX) 0.4 MG CAPS Take 0.4 mg by mouth at bedtime.     Alveda Reasons 20 MG TABS tablet Take 1 tablet (20 mg total) by mouth daily with supper. 30 tablet 0  . XTANDI 40 MG capsule Take 80 mg by mouth 2 (two) times daily. Take 80 mg by mouth two times a day- 5 PM and 9 PM     No current facility-administered medications on file prior to visit.     Review of Systems  Constitutional: Negative for diaphoresis, fever, malaise/fatigue and weight loss.  Respiratory: Negative for hemoptysis, shortness of breath and wheezing.   Cardiovascular: Negative for chest pain, palpitations, orthopnea, claudication and leg swelling.  Gastrointestinal: Negative for  abdominal pain, constipation and nausea.  Genitourinary: Negative for dysuria.  Neurological: Negative for dizziness, loss of consciousness and weakness.  Endo/Heme/Allergies: Does not bruise/bleed easily.  Psychiatric/Behavioral: Negative for depression. The patient is not nervous/anxious.       Objective:  Blood pressure 130/60, pulse 60, height '5\' 7"'  (1.702 m), weight 170 lb (77.1 kg), SpO2 98 %.  Physical Exam  Constitutional: He appears well-developed and well-nourished. No distress.  HENT:  Head: Atraumatic.  Eyes: Conjunctivae  are normal.  Neck: Neck supple. No JVD present. No thyromegaly present.  Cardiovascular: Normal rate, regular rhythm, normal heart sounds and intact distal pulses. Exam reveals no gallop.  No murmur heard. Pulses:      Carotid pulses are 2+ on the right side and 2+ on the left side.      Radial pulses are 2+ on the right side and 2+ on the left side.       Femoral pulses are 2+ on the right side and 2+ on the left side.      Popliteal pulses are 2+ on the right side and 2+ on the left side.       Dorsalis pedis pulses are 1+ on the right side and 1+ on the left side.       Posterior tibial pulses are 1+ on the right side and 1+ on the left side.  Pulmonary/Chest: Effort normal and breath sounds normal.  Abdominal: Soft. Bowel sounds are normal.  Musculoskeletal: Normal range of motion.        General: No edema.  Neurological: He is alert.  Skin: Skin is warm and dry.  Psychiatric: He has a normal mood and affect.    CARDIAC STUDIES:  Echocardiogram 06/08/2018:  1. The left ventricle has hyperdynamic systolic function of >42%. The cavity size is normal. There is no increased left ventricular wall thickness. Echo evidence of normal diastolic filling patterns. Normal left ventricular filling pressures.  2. The aortic valve is tricuspid in structure. There is mild thickening and mild calcification of the aortic valve.  Assessment & Recommendations:    1. Sick sinus syndrome (HCC) S/P permanent pacemaker implantationwith St. Jude dual-chamber pacemaker in 2013.  2. Paroxysmal atrial flutter and fibrillation CHA2DS2-VASc Score is 3.0.  -(CHF; HTN; vasc disease DM,  Male = 1; Age <65 =0; 65-74 = 1,  >75 =2; stroke = 2).  -(Yearly risk of stroke: Score of 1=1.3; 2=2.2; 3=3.2; 4=4; 5=6.7; 6=9.8; 7=>9.8)   3. Hypertension with stage 3 CKD BMP Latest Ref Rng & Units 06/09/2018 06/08/2018 06/07/2018  Glucose 70 - 99 mg/dL 103(H) 117(H) 117(H)  BUN 8 - 23 mg/dL 25(H) 43(H) 60(H)  Creatinine 0.61 - 1.24 mg/dL 1.47(H) 1.95(H) 2.96(H)  Sodium 135 - 145 mmol/L 142 141 141  Potassium 3.5 - 5.1 mmol/L 3.8 4.0 3.9  Chloride 98 - 111 mmol/L 112(H) 113(H) 109  CO2 22 - 32 mmol/L 23 21(L) 19(L)  Calcium 8.9 - 10.3 mg/dL 8.9 8.4(L) 9.2    Labs: 02/22/2018: Creatinine 1.36, EGFR 49/57, potassium 4.2, BMP normal.  Labs 03/30/2017: Serum glucose 91 mg, BUN 13, creatinine 1.24, eGFR 64 mL, potassium 4.5.  HB 13.7/HCT 41.8, platelets 230.  Labs 0 07/25/2016: Potassium 4.1, BUN 13, creatinine 1.17, eGFR 69 mL, CBC normal, total cholesterol 150, triglycerides 111, HDL 65, LDL 63.  TSH normal.   3. Pacemaker-St.Jude S/P PTVP for sick sinus syndrome  11/28/11: St. Jude Medical Accent.03/18/13: 5 minutes of A. Fib  Scheduled in person pacemaker check 03/22/2018: AP/VS 60 bpm, pacer dependent with underlying escape at 39 bpm.  Frequent AMS episodes, 80/EF burden 2.4%, EGM's reveal brief atrial tachycardia.  Normal thresholds, longevity > 4.5 years.  Scheduled Remote Pacemaker transmission 05/03/2018:  There were 443 atrial high rate episodes detected. The longest lasted 0:00:03:14 in duration. There was a <1 % cumulative atrial arrhythmia burden. EGMs show non-conducted PATs and some false AMS/func loss of A. capture. One PMT detected with EGM showing upper rate behavior. No VHR  episodes. Health trends do not demonstrate significant abnormality. Battery longevity is  5.1 years. RA pacing is 98 %, RV pacing is 4.0 %.  Episodic transmission 10/31/2016: 09/19/2016: Frequent mode switches, longest 13.33 minutes with A. Fib with rapid ventricular response at the rate of 150 bpm, otherwise < 37mn of mode switches (Burden <1%).  Normal pacemaker function.  Recommendation: Patient is here on a 1 month follow-up visit for hypertension.  He is presently doing well, blood pressure is now well controlled on amlodipine 5 mg daily.  He is maintaining sinus rhythm, he has not had any bleeding complications from anticoagulation.  No changes in the medications were done today, I will see him back in 6 months or sooner if problems.  We will continue to monitor his pacemaker remotely.  JAdrian Prows MD, FCalifornia [facility]3/04/2019, 4[place]commonPM PFultonhamCardiovascular. PSylviaPager: [phone] Office: [phone] no answer Cell [phone]

## 2018-08-03 DIAGNOSIS — I480 Paroxysmal atrial fibrillation: Secondary | ICD-10-CM | POA: Diagnosis not present

## 2018-08-03 DIAGNOSIS — Z950 Presence of cardiac pacemaker: Secondary | ICD-10-CM | POA: Diagnosis not present

## 2018-08-03 DIAGNOSIS — Z45018 Encounter for adjustment and management of other part of cardiac pacemaker: Secondary | ICD-10-CM | POA: Diagnosis not present

## 2018-09-10 ENCOUNTER — Other Ambulatory Visit: Payer: Self-pay | Admitting: Cardiology

## 2018-09-10 DIAGNOSIS — I10 Essential (primary) hypertension: Secondary | ICD-10-CM

## 2018-09-10 NOTE — Telephone Encounter (Signed)
Please fill

## 2018-10-27 ENCOUNTER — Emergency Department (HOSPITAL_COMMUNITY): Payer: Medicare Other

## 2018-10-27 ENCOUNTER — Inpatient Hospital Stay (HOSPITAL_COMMUNITY): Payer: Medicare Other

## 2018-10-27 ENCOUNTER — Inpatient Hospital Stay (HOSPITAL_COMMUNITY)
Admission: EM | Admit: 2018-10-27 | Discharge: 2018-10-29 | DRG: 312 | Disposition: A | Discharge status: Home / Self Care | Payer: Medicare Other | Attending: Student | Admitting: Student

## 2018-10-27 ENCOUNTER — Encounter (HOSPITAL_COMMUNITY): Payer: Self-pay

## 2018-10-27 ENCOUNTER — Other Ambulatory Visit: Payer: Self-pay

## 2018-10-27 DIAGNOSIS — Z7901 Long term (current) use of anticoagulants: Secondary | ICD-10-CM

## 2018-10-27 DIAGNOSIS — N183 Chronic kidney disease, stage 3 (moderate): Secondary | ICD-10-CM | POA: Diagnosis present

## 2018-10-27 DIAGNOSIS — I4892 Unspecified atrial flutter: Secondary | ICD-10-CM | POA: Diagnosis not present

## 2018-10-27 DIAGNOSIS — R6251 Failure to thrive (child): Secondary | ICD-10-CM

## 2018-10-27 DIAGNOSIS — E079 Disorder of thyroid, unspecified: Secondary | ICD-10-CM | POA: Diagnosis present

## 2018-10-27 DIAGNOSIS — R55 Syncope and collapse: Secondary | ICD-10-CM | POA: Diagnosis present

## 2018-10-27 DIAGNOSIS — R338 Other retention of urine: Secondary | ICD-10-CM | POA: Diagnosis not present

## 2018-10-27 DIAGNOSIS — E785 Hyperlipidemia, unspecified: Secondary | ICD-10-CM | POA: Diagnosis present

## 2018-10-27 DIAGNOSIS — E872 Acidosis: Secondary | ICD-10-CM | POA: Diagnosis not present

## 2018-10-27 DIAGNOSIS — Z79899 Other long term (current) drug therapy: Secondary | ICD-10-CM

## 2018-10-27 DIAGNOSIS — I441 Atrioventricular block, second degree: Secondary | ICD-10-CM | POA: Diagnosis not present

## 2018-10-27 DIAGNOSIS — D72829 Elevated white blood cell count, unspecified: Secondary | ICD-10-CM | POA: Diagnosis present

## 2018-10-27 DIAGNOSIS — Z8546 Personal history of malignant neoplasm of prostate: Secondary | ICD-10-CM | POA: Diagnosis not present

## 2018-10-27 DIAGNOSIS — Z1159 Encounter for screening for other viral diseases: Secondary | ICD-10-CM | POA: Diagnosis not present

## 2018-10-27 DIAGNOSIS — Z950 Presence of cardiac pacemaker: Secondary | ICD-10-CM

## 2018-10-27 DIAGNOSIS — Z923 Personal history of irradiation: Secondary | ICD-10-CM | POA: Diagnosis not present

## 2018-10-27 DIAGNOSIS — Z7983 Long term (current) use of bisphosphonates: Secondary | ICD-10-CM

## 2018-10-27 DIAGNOSIS — C61 Malignant neoplasm of prostate: Secondary | ICD-10-CM | POA: Diagnosis not present

## 2018-10-27 DIAGNOSIS — Z888 Allergy status to other drugs, medicaments and biological substances status: Secondary | ICD-10-CM

## 2018-10-27 DIAGNOSIS — I471 Supraventricular tachycardia: Secondary | ICD-10-CM | POA: Diagnosis not present

## 2018-10-27 DIAGNOSIS — E871 Hypo-osmolality and hyponatremia: Secondary | ICD-10-CM | POA: Diagnosis not present

## 2018-10-27 DIAGNOSIS — E876 Hypokalemia: Secondary | ICD-10-CM | POA: Diagnosis not present

## 2018-10-27 DIAGNOSIS — I129 Hypertensive chronic kidney disease with stage 1 through stage 4 chronic kidney disease, or unspecified chronic kidney disease: Secondary | ICD-10-CM | POA: Diagnosis present

## 2018-10-27 DIAGNOSIS — R64 Cachexia: Secondary | ICD-10-CM | POA: Diagnosis present

## 2018-10-27 DIAGNOSIS — Z9049 Acquired absence of other specified parts of digestive tract: Secondary | ICD-10-CM | POA: Diagnosis not present

## 2018-10-27 DIAGNOSIS — R319 Hematuria, unspecified: Secondary | ICD-10-CM

## 2018-10-27 DIAGNOSIS — E1122 Type 2 diabetes mellitus with diabetic chronic kidney disease: Secondary | ICD-10-CM | POA: Diagnosis present

## 2018-10-27 DIAGNOSIS — E860 Dehydration: Secondary | ICD-10-CM | POA: Diagnosis present

## 2018-10-27 DIAGNOSIS — I495 Sick sinus syndrome: Secondary | ICD-10-CM | POA: Diagnosis present

## 2018-10-27 DIAGNOSIS — Z45018 Encounter for adjustment and management of other part of cardiac pacemaker: Secondary | ICD-10-CM | POA: Diagnosis not present

## 2018-10-27 DIAGNOSIS — N179 Acute kidney failure, unspecified: Secondary | ICD-10-CM

## 2018-10-27 DIAGNOSIS — Z87891 Personal history of nicotine dependence: Secondary | ICD-10-CM

## 2018-10-27 DIAGNOSIS — R61 Generalized hyperhidrosis: Secondary | ICD-10-CM | POA: Diagnosis not present

## 2018-10-27 DIAGNOSIS — Z6824 Body mass index (BMI) 24.0-24.9, adult: Secondary | ICD-10-CM

## 2018-10-27 DIAGNOSIS — R630 Anorexia: Secondary | ICD-10-CM | POA: Diagnosis present

## 2018-10-27 DIAGNOSIS — I483 Typical atrial flutter: Secondary | ICD-10-CM | POA: Diagnosis not present

## 2018-10-27 DIAGNOSIS — I9589 Other hypotension: Secondary | ICD-10-CM | POA: Diagnosis not present

## 2018-10-27 DIAGNOSIS — R7989 Other specified abnormal findings of blood chemistry: Secondary | ICD-10-CM | POA: Diagnosis not present

## 2018-10-27 DIAGNOSIS — R627 Adult failure to thrive: Secondary | ICD-10-CM | POA: Diagnosis present

## 2018-10-27 DIAGNOSIS — Z9221 Personal history of antineoplastic chemotherapy: Secondary | ICD-10-CM

## 2018-10-27 DIAGNOSIS — E861 Hypovolemia: Secondary | ICD-10-CM | POA: Diagnosis not present

## 2018-10-27 LAB — T4, FREE: Free T4: 1.01 ng/dL (ref 0.61–1.12)

## 2018-10-27 LAB — URINALYSIS, ROUTINE W REFLEX MICROSCOPIC
Bilirubin Urine: NEGATIVE
Glucose, UA: NEGATIVE mg/dL
Ketones, ur: NEGATIVE mg/dL
Leukocytes,Ua: NEGATIVE
Nitrite: NEGATIVE
Protein, ur: 100 mg/dL — AB
RBC / HPF: >50 RBC/hpf — ABNORMAL HIGH (ref 0–5)
Specific Gravity, Urine: 1.018 (ref 1.005–1.030)
pH: 6 (ref 5.0–8.0)

## 2018-10-27 LAB — COMPREHENSIVE METABOLIC PANEL
ALT: 13 U/L (ref 0–44)
AST: 31 U/L (ref 15–41)
Albumin: 4.2 g/dL (ref 3.5–5.0)
Alkaline Phosphatase: 106 U/L (ref 38–126)
Anion gap: 19 — ABNORMAL HIGH (ref 5–15)
BUN: 90 mg/dL — ABNORMAL HIGH (ref 8–23)
CO2: 17 mmol/L — ABNORMAL LOW (ref 22–32)
Calcium: 9.5 mg/dL (ref 8.9–10.3)
Chloride: 93 mmol/L — ABNORMAL LOW (ref 98–111)
Creatinine, Ser: 4.81 mg/dL — ABNORMAL HIGH (ref 0.61–1.24)
GFR calc Af Amer: 12 mL/min — ABNORMAL LOW (ref >60)
GFR calc non Af Amer: 11 mL/min — ABNORMAL LOW (ref >60)
Glucose, Bld: 185 mg/dL — ABNORMAL HIGH (ref 70–99)
Potassium: 3.3 mmol/L — ABNORMAL LOW (ref 3.5–5.1)
Sodium: 129 mmol/L — ABNORMAL LOW (ref 135–145)
Total Bilirubin: 1.3 mg/dL — ABNORMAL HIGH (ref 0.3–1.2)
Total Protein: 8.1 g/dL (ref 6.5–8.1)

## 2018-10-27 LAB — CBC WITH DIFFERENTIAL/PLATELET
Abs Immature Granulocytes: 0.05 10*3/uL (ref 0.00–0.07)
Basophils Absolute: 0 10*3/uL (ref 0.0–0.1)
Basophils Relative: 0 %
Eosinophils Absolute: 0 10*3/uL (ref 0.0–0.5)
Eosinophils Relative: 0 %
HCT: 46.3 % (ref 39.0–52.0)
Hemoglobin: 16.4 g/dL (ref 13.0–17.0)
Immature Granulocytes: 1 %
Lymphocytes Relative: 15 %
Lymphs Abs: 1.6 10*3/uL (ref 0.7–4.0)
MCH: 31.1 pg (ref 26.0–34.0)
MCHC: 35.4 g/dL (ref 30.0–36.0)
MCV: 87.9 fL (ref 80.0–100.0)
Monocytes Absolute: 1.1 10*3/uL — ABNORMAL HIGH (ref 0.1–1.0)
Monocytes Relative: 10 %
Neutro Abs: 8 10*3/uL — ABNORMAL HIGH (ref 1.7–7.7)
Neutrophils Relative %: 74 %
Platelets: 275 10*3/uL (ref 150–400)
RBC: 5.27 MIL/uL (ref 4.22–5.81)
RDW: 12.6 % (ref 11.5–15.5)
WBC: 10.8 10*3/uL — ABNORMAL HIGH (ref 4.0–10.5)
nRBC: 0 % (ref 0.0–0.2)

## 2018-10-27 LAB — TSH: TSH: 0.361 u[IU]/mL (ref 0.350–4.500)

## 2018-10-27 LAB — MAGNESIUM: Magnesium: 2.8 mg/dL — ABNORMAL HIGH (ref 1.7–2.4)

## 2018-10-27 LAB — TROPONIN I (HIGH SENSITIVITY): Troponin I (High Sensitivity): 84 ng/L — ABNORMAL HIGH (ref <18)

## 2018-10-27 MED ORDER — NEPRO/CARBSTEADY PO LIQD: 237.0000 mL | Freq: Two times a day (BID) | ORAL | Status: DC

## 2018-10-27 MED ORDER — FLECAINIDE ACETATE 100 MG PO TABS
100.0000 mg | ORAL_TABLET | Freq: Two times a day (BID) | ORAL | Status: DC
  Administered 2018-10-27: 100 mg via ORAL
  Filled 2018-10-27 (×2): qty 1

## 2018-10-27 MED ORDER — HEPARIN SODIUM (PORCINE) 5000 UNIT/ML IJ SOLN
5000.0000 [IU] | Freq: Three times a day (TID) | INTRAMUSCULAR | Status: DC
  Administered 2018-10-27 – 2018-10-29 (×6): 5000 [IU] via SUBCUTANEOUS
  Filled 2018-10-27 (×6): qty 1

## 2018-10-27 MED ORDER — SODIUM CHLORIDE 0.9 % IV BOLUS
500.0000 mL | Freq: Once | INTRAVENOUS | Status: AC
  Administered 2018-10-27: 16:00:00 500 mL via INTRAVENOUS

## 2018-10-27 MED ORDER — SODIUM CHLORIDE 0.9 % IV SOLN
INTRAVENOUS | Status: DC
  Administered 2018-10-27 – 2018-10-29 (×2): via INTRAVENOUS

## 2018-10-27 MED ORDER — HEPARIN SODIUM (PORCINE) 5000 UNIT/ML IJ SOLN: 5000.0000 [IU] | Freq: Three times a day (TID) | INTRAMUSCULAR | Status: DC

## 2018-10-27 NOTE — Progress Notes (Signed)
Patient oral chemotherapy medication at bedside.  Patient agreed to have medication sent to pharmacy.  Pharmacy states that patient personal medication will need to be dispensed.  No order for medication at this time but pharmacy will hold onto it.

## 2018-10-27 NOTE — ED Notes (Signed)
ED TO INPATIENT HANDOFF REPORT  ED Nurse Name and Phone #: Annie Main, RN   S Name/Age/Gender [name] [name] 83 y.o. male Room/Bed: TRAAC/TRAAC  Code Status   Code Status: Full Code  Home/SNF/Other Home Patient oriented to: self, place, time and situation Is this baseline? Yes   Triage Complete: Triage complete  Chief Complaint SVT  Triage Note Pt arrived via [facility]. Per EMS pt is currently taking chemo and has been passing out every day for the last two weeks. Pt called EMS after he reports having blood in his urine. EMS reports pt having episodes of SVT with HR in the 190s for several seconds and then returns to the 60s. EMS also reports that patients SBP was 76 on arrival. EMS VS 97.41f, RR 18, BP 158/90, CBG 884.    Allergies Allergies  Allergen Reactions  . Xarelto [Rivaroxaban] Other (See Comments)    "I didn't like the way it made me feel" (patient didn't elaborate)    Level of Care/Admitting Diagnosis ED Disposition    ED Disposition Condition [place]: [place] [100100]  Level of Care: Telemetry Cardiac [103]  Covid Evaluation: Screening Protocol (No Symptoms)  Diagnosis: Syncope and collapse [780.2.ICD-9-CM]  Admitting Physician: [name] [name] [phone]  Attending Physician: [name] [name], AVA [phone]  Estimated length of stay: 3 - 4 days  Certification:: I certify this patient will need inpatient services for at least 2 midnights  PT Class (Do Not Modify): Inpatient [101]  PT Acc Code (Do Not Modify): Private [1]       B Medical/Surgery History Past Medical History:  Diagnosis Date  . AKI (acute kidney injury) ([place]) 06/08/2018  . Atrial flutter ([place])   . Carcinoma of prostate ([place]) 12/30/2011   Treated with seed/radiation therapy.   . Colostomy in place [name] [name])   . Difficulty sleeping    lives in shelter currently  . Diverticulitis   . Dyslipidemia 12/30/2011  . Encounter for care of pacemaker 06/17/2018  . Frequency of  urination   . Heart murmur   . Hypertension 04/30/11   Cardioversion 06/16/11  . Near syncope 06/07/2018  . Nocturia   . Orthostatic hypotension 06/08/2018  . Pacemaker 7/13    sick sinus syndrome/St Jude pacemaker  . Paroxysmal atrial flutter ([place]) 06/16/2011  . Prostate cancer ([place])   . Syncope and collapse 11/20/2011   Atrial and ventricular standstill > 3 seconds.   . Thyroid disease    had "overactive thyroid in 1997" - no known problem since   Past Surgical History:  Procedure Laterality Date  . ATRIAL FLUTTER ABLATION N/A 11/21/2011   Procedure: ATRIAL FLUTTER ABLATION;  Surgeon: [name] Grayer, MD;  Location: [name] CATH [facility];  Service: Cardiovascular;  Laterality: N/A;  . CARDIAC ELECTROPHYSIOLOGY STUDY AND ABLATION  7/13  . CARDIOVERSION  06/16/2011   Procedure: CARDIOVERSION;  Surgeon: [name] Page, MD;  Location: [place];  Service: Cardiovascular;  Laterality: N/A;  . COLON SURGERY  05/13/2013  . COLOSTOMY CLOSURE N/A 11/25/2013   Procedure: COLOSTOMY CLOSURE;  Surgeon: [name] Regal, MD;  Location: [facility];  Service: General;  Laterality: N/A;  . COLOSTOMY CLOSURE  11/25/2013  . PACEMAKER INSERTION  11/28/11   SJM Accent DR RF implanted by Dr Rayann Heman  . PARTIAL COLECTOMY N/A 05/13/2013   Procedure: sigmoid COLECTOMY  colostomy ;  Surgeon: [name] Regal, MD;  Location: [facility];  Service: General;  Laterality: N/A;  . PERMANENT PACEMAKER INSERTION N/A 11/28/2011  Procedure: PERMANENT PACEMAKER INSERTION;  Surgeon: [name] Grayer, MD;  Location: [facility] CATH LAB;  Service: Cardiovascular;  Laterality: N/A;  . RADIOACTIVE SEED IMPLANT    . TONSILLECTOMY       A IV Location/Drains/Wounds Patient Lines/Drains/Airways Status   Active Line/Drains/Airways    Name:   Placement date:   Placement time:   Site:   Days:   Peripheral IV 10/27/18 Left Wrist   10/27/18    1528    Wrist   less than 1   Colostomy RLQ   05/13/13    1046    RLQ   1993   Incision 11/29/11 Chest Left   11/29/11    0036     2524    Incision 05/13/13 Abdomen   05/13/13    0957     1993   Incision (Closed) 11/25/13 Abdomen Other (Comment)   11/25/13    1324     1797          Intake/Output Last 24 hours No intake or output data in the 24 hours ending 10/27/18 1845  Labs/Imaging Results for orders placed or performed during the [name] encounter of 10/27/18 (from the past 48 hour(s))  CBC with Differential/Platelet     Status: Abnormal   Collection Time: 10/27/18  3:35 PM  Result Value Ref Range   WBC 10.8 (H) 4.0 - 10.5 K/uL    Comment: WHITE COUNT CONFIRMED ON SMEAR   RBC 5.27 4.22 - 5.81 MIL/uL   Hemoglobin 16.4 13.0 - 17.0 g/dL   HCT 46.3 39.0 - 52.0 %   MCV 87.9 80.0 - 100.0 fL   MCH 31.1 26.0 - 34.0 pg   MCHC 35.4 30.0 - 36.0 g/dL   RDW 12.6 11.5 - 15.5 %   Platelets 275 150 - 400 K/uL   nRBC 0.0 0.0 - 0.2 %   Neutrophils Relative % 74 %   Neutro Abs 8.0 (H) 1.7 - 7.7 K/uL   Lymphocytes Relative 15 %   Lymphs Abs 1.6 0.7 - 4.0 K/uL   Monocytes Relative 10 %   Monocytes Absolute 1.1 (H) 0.1 - 1.0 K/uL   Eosinophils Relative 0 %   Eosinophils Absolute 0.0 0.0 - 0.5 K/uL   Basophils Relative 0 %   Basophils Absolute 0.0 0.0 - 0.1 K/uL   Immature Granulocytes 1 %   Abs Immature Granulocytes 0.05 0.00 - 0.07 K/uL    Comment: Performed at [name], [address] [place], [place] [address]  Comprehensive metabolic panel     Status: Abnormal   Collection Time: 10/27/18  3:35 PM  Result Value Ref Range   Sodium 129 (L) 135 - 145 mmol/L   Potassium 3.3 (L) 3.5 - 5.1 mmol/L   Chloride 93 (L) 98 - 111 mmol/L   CO2 17 (L) 22 - 32 mmol/L   Glucose, Bld 185 (H) 70 - 99 mg/dL   BUN 90 (H) 8 - 23 mg/dL   Creatinine, Ser 4.81 (H) 0.61 - 1.24 mg/dL   Calcium 9.5 8.9 - 10.3 mg/dL   Total Protein 8.1 6.5 - 8.1 g/dL   Albumin 4.2 3.5 - 5.0 g/dL   AST 31 15 - 41 U/L   ALT 13 0 - 44 U/L   Alkaline Phosphatase 106 38 - 126 U/L   Total Bilirubin 1.3 (H) 0.3 - 1.2 mg/dL   GFR calc non Af Amer 11  (L) >60 mL/min   GFR calc Af Amer 12 (L) >60 mL/min  Anion gap 19 (H) 5 - 15    Comment: Performed at [name], [place] [address] [place], [place] [address]  Magnesium     Status: Abnormal   Collection Time: 10/27/18  3:35 PM  Result Value Ref Range   Magnesium 2.8 (H) 1.7 - 2.4 mg/dL    Comment: Performed at [facility] [address] [place], [place] [address]  T4, free     Status: None   Collection Time: 10/27/18  3:35 PM  Result Value Ref Range   Free T4 1.01 0.61 - 1.12 ng/dL    Comment: (NOTE) Biotin ingestion may interfere with free T4 tests. If the results are inconsistent with the TSH level, previous test results, or the clinical presentation, then consider biotin interference. If needed, order repeat testing after stopping biotin. Performed at [name], [place] [address], [place], [place] [address]   Troponin I (High Sensitivity)     Status: Abnormal   Collection Time: 10/27/18  3:35 PM  Result Value Ref Range   Troponin I (High Sensitivity) 84 (H) <18 ng/L    Comment: (NOTE) Elevated high sensitivity troponin I (hsTnI) values and significant  changes across serial measurements may suggest ACS but many other  chronic and acute conditions are known to elevate hsTnI results.  Refer to the "Links" section for chest pain algorithms and additional  guidance. Performed at [name], [place] [address] [place], [place] [address]   Urinalysis, Routine w reflex microscopic     Status: Abnormal   Collection Time: 10/27/18  5:45 PM  Result Value Ref Range   Color, Urine AMBER (A) YELLOW    Comment: BIOCHEMICALS MAY BE AFFECTED BY COLOR   APPearance TURBID (A) CLEAR   Specific Gravity, Urine 1.018 1.005 - 1.030   pH 6.0 5.0 - 8.0   Glucose, UA NEGATIVE NEGATIVE mg/dL   Hgb urine dipstick MODERATE (A) NEGATIVE   Bilirubin Urine NEGATIVE NEGATIVE   Ketones, ur NEGATIVE NEGATIVE mg/dL   Protein, ur 100 (A) NEGATIVE mg/dL   Nitrite NEGATIVE  NEGATIVE   Leukocytes,Ua NEGATIVE NEGATIVE   RBC / HPF >50 (H) 0 - 5 RBC/hpf   WBC, UA 6-10 0 - 5 WBC/hpf   Bacteria, UA FEW (A) NONE SEEN   Squamous Epithelial / LPF 0-5 0 - 5   WBC Clumps PRESENT    Hyaline Casts, UA PRESENT     Comment: Performed at [name], [address] [place], [place] [address]   Ct Head Wo Contrast  Result Date: 10/27/2018 CLINICAL DATA:  Head trauma EXAM: CT HEAD WITHOUT CONTRAST TECHNIQUE: Contiguous axial images were obtained from the base of the skull through the vertex without intravenous contrast. COMPARISON:  11/20/2011 FINDINGS: Brain: No acute territorial infarction, hemorrhage or intracranial mass. Mild atrophy. Slight ventricular prominence, likely due to atrophy. Stable size. Vascular: No hyperdense vessels. Scattered calcifications at the carotid siphons Skull: Normal. Negative for fracture or focal lesion. Sinuses/Orbits: No acute finding. Other: None IMPRESSION: 1. No CT evidence for acute intracranial abnormality. 2. Atrophy Electronically Signed   By: Donavan Foil M.D.   On: 10/27/2018 17:16   Dg Chest Port 1 View  Result Date: 10/27/2018 CLINICAL DATA:  Weakness EXAM: PORTABLE CHEST 1 VIEW COMPARISON:  06/07/2018 FINDINGS: Hyperinflated lungs. Left-sided pacing device as before. No focal consolidation or effusion. Normal cardiomediastinal silhouette. No pneumothorax. IMPRESSION: No active disease. Electronically Signed   By: Madie Reno.D.  On: 10/27/2018 16:42    Pending Labs Unresulted Labs (From admission, onward)    Start     Ordered   10/28/18 0500  Comprehensive metabolic panel  Tomorrow morning,   R     10/27/18 1756   10/28/18 0500  CBC  Tomorrow morning,   R     10/27/18 1756   10/28/18 0500  Protime-INR  Tomorrow morning,   R     10/27/18 1756   10/27/18 1744  CBC  (heparin)  Once,   STAT    Comments: Baseline for heparin therapy IF NOT ALREADY DRAWN.  Notify MD if PLT < 100 K.    10/27/18 1756   10/27/18 1744   Creatinine, serum  (heparin)  Once,   STAT    Comments: Baseline for heparin therapy IF NOT ALREADY DRAWN.    10/27/18 1756   10/27/18 1638  Novel Coronavirus, NAA ([name] order; send-out to ref lab)  Once,   STAT    Comments: No isolation needed for this testing (if isolation ordered for another indication, maintain current isolation).   Question:  Required for discharge to:  Answer:  SNF/facility placement   10/27/18 1637   10/27/18 1535  TSH  ONCE - STAT,   STAT     10/27/18 1535          Vitals/Pain Today's Vitals   10/27/18 1533 10/27/18 1650 10/27/18 1715 10/27/18 1730  BP:   107/76 (!) 126/53  Resp:   (!) 24 17  PainSc: 0-No pain 0-No pain      Isolation Precautions Droplet and Contact precautions  Medications Medications  0.9 %  sodium chloride infusion (has no administration in time range)  heparin injection 5,000 Units (has no administration in time range)  sodium chloride 0.9 % bolus 500 mL (0 mLs Intravenous Stopped 10/27/18 1649)    Mobility walks High fall risk   Focused Assessments Cardiac Assessment Handoff:  Cardiac Rhythm: Normal sinus rhythm Lab Results  Component Value Date   CKTOTAL 170 11/20/2011   CKMB 3.3 11/20/2011   TROPONINI <0.03 06/07/2018   No results found for: DDIMER Does the Patient currently have chest pain? No     R Recommendations: See Admitting Provider Note  Report given to:   Additional Notes:

## 2018-10-27 NOTE — ED Triage Notes (Signed)
Pt arrived via [facility]. Per EMS pt is currently taking chemo and has been passing out every day for the last two weeks. Pt called EMS after he reports having blood in his urine. EMS reports pt having episodes of SVT with HR in the 190s for several seconds and then returns to the 60s. EMS also reports that patients SBP was 76 on arrival. EMS VS 97.64f, RR 18, BP 158/90, CBG 884.

## 2018-10-27 NOTE — ED Notes (Signed)
Patient transported to CT 

## 2018-10-27 NOTE — H&P (Signed)
[name] [name] is an 83 y.o. male.   Chief Complaint: Near syncope, hematuria, failure to thrive, SVT HPI: The patient is an 83 yr old man who carries a past medical history significant for prostate cancer, atrial flutter, Colostomy, dyslipidemia, orthostatic hypotension, sick sinus syndrome/s/p St. Jude pacemaker, and thyroid disease.   The patient presented to [facility] with complaints of not being able to eat for about a week due to lack of appetite. He has been having to get up in the middle of the night to urinate. He states that he gets a flushed feeling and then he has to rush to go urinate. He states that many times when he gets to the bathroom he feels like he is going to pass out. When he feels like this, he lies down so he isn't injured of falls. He states that his urine when he starts to pee is yellow, but then turns bloody. He has been having generalized weakness and fatigue. He denies fevers, chill, nausea, vomiting, diarrhea, or constipation. He does not get short of breath, chest pain, or palpitations.  In the ED the patient is noted to have very short episodes of narrow complex tachycardia. These episodes are fleeting. The patient then returns to sinus rhythm. He is found to be in acute on chronic renal failure. WBC is elevated as is his troponin. There are no ischemic EKG changes. Cardiology has been called to consult.  The hospitalist service has been consulted to admit the patient for further evaluation and treatment.  Past Medical History:  Diagnosis Date  . AKI (acute kidney injury) ([place]) 06/08/2018  . Atrial flutter ([place])   . Carcinoma of prostate ([place]) 12/30/2011   Treated with seed/radiation therapy.   . Colostomy in place [facility])   . Difficulty sleeping    lives in shelter currently  . Diverticulitis   . Dyslipidemia 12/30/2011  . Encounter for care of pacemaker 06/17/2018  . Frequency of urination   . Heart murmur   . Hypertension 04/30/11   Cardioversion 06/16/11  . Near syncope  06/07/2018  . Nocturia   . Orthostatic hypotension 06/08/2018  . Pacemaker 7/13    sick sinus syndrome/St Jude pacemaker  . Paroxysmal atrial flutter ([place]) 06/16/2011  . Prostate cancer ([place])   . Syncope and collapse 11/20/2011   Atrial and ventricular standstill > 3 seconds.   . Thyroid disease    had "overactive thyroid in 1997" - no known problem since    Past Surgical History:  Procedure Laterality Date  . ATRIAL FLUTTER ABLATION N/A 11/21/2011   Procedure: ATRIAL FLUTTER ABLATION;  Surgeon: Thompson Grayer, MD;  Location: [facility] CATH LAB;  Service: Cardiovascular;  Laterality: N/A;  . CARDIAC ELECTROPHYSIOLOGY STUDY AND ABLATION  7/13  . CARDIOVERSION  06/16/2011   Procedure: CARDIOVERSION;  Surgeon: Laverda Page, MD;  Location: [place];  Service: Cardiovascular;  Laterality: N/A;  . COLON SURGERY  05/13/2013  . COLOSTOMY CLOSURE N/A 11/25/2013   Procedure: COLOSTOMY CLOSURE;  Surgeon: Earnstine Regal, MD;  Location: [facility];  Service: General;  Laterality: N/A;  . COLOSTOMY CLOSURE  11/25/2013  . PACEMAKER INSERTION  11/28/11   SJM Accent DR RF implanted by Dr Rayann Heman  . PARTIAL COLECTOMY N/A 05/13/2013   Procedure: sigmoid COLECTOMY  colostomy ;  Surgeon: Earnstine Regal, MD;  Location: [facility];  Service: General;  Laterality: N/A;  . PERMANENT PACEMAKER INSERTION N/A 11/28/2011   Procedure: PERMANENT PACEMAKER INSERTION;  Surgeon: Thompson Grayer, MD;  Location: [facility]  CATH LAB;  Service: Cardiovascular;  Laterality: N/A;  . RADIOACTIVE SEED IMPLANT    . TONSILLECTOMY      Family History  Problem Relation Age of Onset  . Leukemia Mother   . Dementia Father    Social History:  reports that he quit smoking about 9 years ago. His smoking use included cigarettes. He has a 25.00 pack-year smoking history. He has never used smokeless tobacco. He reports that he does not drink alcohol or use drugs. (Not in a hospital admission)   Allergies:  Allergies  Allergen Reactions  . Xarelto [Rivaroxaban] Other  (See Comments)    "I didn't like the way it made me feel" (patient didn't elaborate)    Pertinent items noted in HPI and remainder of comprehensive ROS otherwise negative.   General appearance: alert, cooperative, cachectic, fatigued and no distress Head: Normocephalic, without obvious abnormality, atraumatic Eyes: conjunctivae/corneas clear. PERRL, EOM's intact. Fundi benign. Throat: lips, mucosa, and tongue normal; teeth and gums normal Neck: no adenopathy, no carotid bruit, no JVD, supple, symmetrical, trachea midline and thyroid not enlarged, symmetric, no tenderness/mass/nodules Resp: No increased work of breathing. No wheezes, rales, or rhonchi. No tactile fremitus. Chest wall: no tenderness Cardio: regular rate and rhythm, S1, S2 normal, no murmur, click, rub or gallop GI: soft, non-tender; bowel sounds normal; no masses,  no organomegaly and scaffoid. Extremities: extremities normal, atraumatic, no cyanosis or edema and cachectic Pulses: Diminished distal pulses. Skin: Skin color, texture, turgor normal. No rashes or lesions Lymph nodes: Cervical, supraclavicular, and axillary nodes normal. Neurologic: Alert and oriented X 3, normal strength and tone. Normal symmetric reflexes. Normal coordination and gait Incision/Wound:N/A  Results for orders placed or performed during the hospital encounter of 10/27/18 (from the past 48 hour(s))  CBC with Differential/Platelet     Status: Abnormal   Collection Time: 10/27/18  3:35 PM  Result Value Ref Range   WBC 10.8 (H) 4.0 - 10.5 K/uL    Comment: WHITE COUNT CONFIRMED ON SMEAR   RBC 5.27 4.22 - 5.81 MIL/uL   Hemoglobin 16.4 13.0 - 17.0 g/dL   HCT 46.3 39.0 - 52.0 %   MCV 87.9 80.0 - 100.0 fL   MCH 31.1 26.0 - 34.0 pg   MCHC 35.4 30.0 - 36.0 g/dL   RDW 12.6 11.5 - 15.5 %   Platelets 275 150 - 400 K/uL   nRBC 0.0 0.0 - 0.2 %   Neutrophils Relative % 74 %   Neutro Abs 8.0 (H) 1.7 - 7.7 K/uL   Lymphocytes Relative 15 %   Lymphs Abs  1.6 0.7 - 4.0 K/uL   Monocytes Relative 10 %   Monocytes Absolute 1.1 (H) 0.1 - 1.0 K/uL   Eosinophils Relative 0 %   Eosinophils Absolute 0.0 0.0 - 0.5 K/uL   Basophils Relative 0 %   Basophils Absolute 0.0 0.0 - 0.1 K/uL   Immature Granulocytes 1 %   Abs Immature Granulocytes 0.05 0.00 - 0.07 K/uL    Comment: Performed at [facility], [address] [place], [place] [address]  Comprehensive metabolic panel     Status: Abnormal   Collection Time: 10/27/18  3:35 PM  Result Value Ref Range   Sodium 129 (L) 135 - 145 mmol/L   Potassium 3.3 (L) 3.5 - 5.1 mmol/L   Chloride 93 (L) 98 - 111 mmol/L   CO2 17 (L) 22 - 32 mmol/L   Glucose, Bld 185 (H) 70 - 99 mg/dL   BUN 90 (  H) 8 - 23 mg/dL   Creatinine, Ser 4.81 (H) 0.61 - 1.24 mg/dL   Calcium 9.5 8.9 - 10.3 mg/dL   Total Protein 8.1 6.5 - 8.1 g/dL   Albumin 4.2 3.5 - 5.0 g/dL   AST 31 15 - 41 U/L   ALT 13 0 - 44 U/L   Alkaline Phosphatase 106 38 - 126 U/L   Total Bilirubin 1.3 (H) 0.3 - 1.2 mg/dL   GFR calc non Af Amer 11 (L) >60 mL/min   GFR calc Af Amer 12 (L) >60 mL/min   Anion gap 19 (H) 5 - 15    Comment: Performed at [facility] [address], [place], [place] [address]  Magnesium     Status: Abnormal   Collection Time: 10/27/18  3:35 PM  Result Value Ref Range   Magnesium 2.8 (H) 1.7 - 2.4 mg/dL    Comment: Performed at [facility] [address], [place], [place] [address]  T4, free     Status: None   Collection Time: 10/27/18  3:35 PM  Result Value Ref Range   Free T4 1.01 0.61 - 1.12 ng/dL    Comment: (NOTE) Biotin ingestion may interfere with free T4 tests. If the results are inconsistent with the TSH level, previous test results, or the clinical presentation, then consider biotin interference. If needed, order repeat testing after stopping biotin. Performed at [facility], [place] [address], [place], [place] [address]   Troponin I (High Sensitivity)     Status: Abnormal   Collection  Time: 10/27/18  3:35 PM  Result Value Ref Range   Troponin I (High Sensitivity) 84 (H) <18 ng/L    Comment: (NOTE) Elevated high sensitivity troponin I (hsTnI) values and significant  changes across serial measurements may suggest ACS but many other  chronic and acute conditions are known to elevate hsTnI results.  Refer to the "Links" section for chest pain algorithms and additional  guidance. Performed at [facility], [place] [address], [place], [place] [address]   Urinalysis, Routine w reflex microscopic     Status: Abnormal   Collection Time: 10/27/18  5:45 PM  Result Value Ref Range   Color, Urine AMBER (A) YELLOW    Comment: BIOCHEMICALS MAY BE AFFECTED BY COLOR   APPearance TURBID (A) CLEAR   Specific Gravity, Urine 1.018 1.005 - 1.030   pH 6.0 5.0 - 8.0   Glucose, UA NEGATIVE NEGATIVE mg/dL   Hgb urine dipstick MODERATE (A) NEGATIVE   Bilirubin Urine NEGATIVE NEGATIVE   Ketones, ur NEGATIVE NEGATIVE mg/dL   Protein, ur 100 (A) NEGATIVE mg/dL   Nitrite NEGATIVE NEGATIVE   Leukocytes,Ua NEGATIVE NEGATIVE   RBC / HPF >50 (H) 0 - 5 RBC/hpf   WBC, UA 6-10 0 - 5 WBC/hpf   Bacteria, UA FEW (A) NONE SEEN   Squamous Epithelial / LPF 0-5 0 - 5   WBC Clumps PRESENT    Hyaline Casts, UA PRESENT     Comment: Performed at [facility], [address], [place], [place] [address]   @[email or web address] @  Blood pressure (!) 126/53, resp. rate 17.   Assessment/Plan  Syncope/near syncope: Multifactorial. Likely due to vaso-vagal reflex in the setting of volume depletion and possibly SVT. The patient will be admitted to a telemetry bed. Cardiology will be consulted. The patient will be continued on his rate limiting medications including tambicor and metoprolol as blood pressure allows.  SVT: Patient has known  paroxysmal atrial flutter for which he has had ablation in the past. He is on metoprolol and tambicor as rate limiting agents. Compliance is unknown. Cardiology has been  consulted  Acute on chronic kidney disease: It seems that his baseline creatinine is about 1.5. he presents today with creatinine of 4.81. He will be given IV fluids. Nephrotoxic substances and hypotension will be avoided. Renal ultrasound has been ordered. Nephrology will be consulted in the morning. Certainly failure to thrive plays a role just as renal insufficiency may add to failure to thrive.  Failure to thrive: The patient states that he has only been able to ingest a little hot and sour soup in the past week. He states that he just has no appetite. He is volume depleted.  Prostate Cancer: The patient has had radiation therapy and has taken [facility] for oral chemotherapy.   Hematuria: Likely due to Xarelto with GFR of 12. Stop xarelto. UA negative for nitrates and leukocyte esterase.  I have seen and examined this patient myself. I have spent 78 minutes in his evaluation and admission.  Severity of Illness: The appropriate patient status for this patient is INPATIENT. Inpatient status is judged to be reasonable and necessary in order to provide the required intensity of service to ensure the patient's safety. The patient's presenting symptoms, physical exam findings, and initial radiographic and laboratory data in the context of their chronic comorbidities is felt to place them at high risk for further clinical deterioration. Furthermore, it is not anticipated that the patient will be medically stable for discharge from the hospital within 2 midnights of admission. The following factors support the patient status of inpatient.   " The patient's presenting symptoms include syncope, hematuria, failure to thrive. " The worrisome physical exam findings include Cachexia. " The initial radiographic and laboratory data are worrisome because of Acute on chronic renal failure, SVT. " The chronic co-morbidities include Prostate cancer, paroxysmal atrial flutter..  * I certify that at the point of  admission it is my clinical judgment that the patient will require inpatient hospital care spanning beyond 2 midnights from the point of admission due to high intensity of service, high risk for further deterioration and high frequency of surveillance required.*  ELOS: 3-4 days CODE STATUS: Clarified Full Code Status with patient DVT Prophylaxis: Heparin sub Q. Will not start until tomorrow as the patient has been taking xarelto. Disposition: Patient may very well need SNF when his acute inpatient stay is over.  Edras Wilford 10/27/2018, 6:48 PM

## 2018-10-27 NOTE — ED Notes (Signed)
Attempted report x1. 

## 2018-10-27 NOTE — ED Notes (Signed)
Pacemaker interrogated. 

## 2018-10-27 NOTE — ED Provider Notes (Signed)
[facility] EMERGENCY DEPARTMENT Provider Note   CSN: [id number] Arrival date & time: 10/27/18  1527    History   Chief Complaint Chief Complaint  Patient presents with  . SVT    HPI [name] [name] is a 83 y.o. male.     HPI Patient states that he has not been eating for the past week because he has not been hungry.  States he is been getting up in the middle of the night to urinate.  When he stands up he gets lightheaded and collapses.  States he has been losing consciousness daily.  He will regain consciousness on the floor then continue ambulating to the bathroom.  Initially start urine stream with yellow urine which then turns bloody.  Complains of generalized weakness and fatigue.  EMS noted patient was falling into narrow complex tachycardia every few minutes.  Patient denies having chest pain, shortness of breath or palpitations during these periods. Past Medical History:  Diagnosis Date  . AKI (acute kidney injury) ([place]) 06/08/2018  . Atrial flutter ([place])   . Carcinoma of prostate ([place]) 12/30/2011   Treated with seed/radiation therapy.   . Colostomy in place [facility])   . Difficulty sleeping    lives in shelter currently  . Diverticulitis   . Dyslipidemia 12/30/2011  . Encounter for care of pacemaker 06/17/2018  . Frequency of urination   . Heart murmur   . Hypertension 04/30/11   Cardioversion 06/16/11  . Near syncope 06/07/2018  . Nocturia   . Orthostatic hypotension 06/08/2018  . Pacemaker 7/13    sick sinus syndrome/St Jude pacemaker  . Paroxysmal atrial flutter ([place]) 06/16/2011  . Prostate cancer ([place])   . Syncope and collapse 11/20/2011   Atrial and ventricular standstill > 3 seconds.   . Thyroid disease    had "overactive thyroid in 1997" - no known problem since    Patient Active Problem List   Diagnosis Date Noted  . Encounter for care of pacemaker 06/17/2018  . Diverticular disease 05/13/2013  . Renal insufficiency 12/31/2011  . Dyslipidemia  12/30/2011  . Carcinoma of prostate ([place]) 12/30/2011  . Pacemaker-St.Jude Accent DR-RF dual chamber pacemaker  12/03/2011  . Sick sinus syndrome ([place]) 11/28/2011  . Paroxysmal atrial flutter ([place]) 06/16/2011  . Essential hypertension, benign 06/16/2011    Past Surgical History:  Procedure Laterality Date  . ATRIAL FLUTTER ABLATION N/A 11/21/2011   Procedure: ATRIAL FLUTTER ABLATION;  Surgeon: Thompson Grayer, MD;  Location: [facility] CATH LAB;  Service: Cardiovascular;  Laterality: N/A;  . CARDIAC ELECTROPHYSIOLOGY STUDY AND ABLATION  7/13  . CARDIOVERSION  06/16/2011   Procedure: CARDIOVERSION;  Surgeon: Laverda Page, MD;  Location: [facility];  Service: Cardiovascular;  Laterality: N/A;  . COLON SURGERY  05/13/2013  . COLOSTOMY CLOSURE N/A 11/25/2013   Procedure: COLOSTOMY CLOSURE;  Surgeon: Earnstine Regal, MD;  Location: [facility] ORS;  Service: General;  Laterality: N/A;  . COLOSTOMY CLOSURE  11/25/2013  . PACEMAKER INSERTION  11/28/11   SJM Accent DR RF implanted by Dr Rayann Heman  . PARTIAL COLECTOMY N/A 05/13/2013   Procedure: sigmoid COLECTOMY  colostomy ;  Surgeon: Earnstine Regal, MD;  Location: [facility] ORS;  Service: General;  Laterality: N/A;  . PERMANENT PACEMAKER INSERTION N/A 11/28/2011   Procedure: PERMANENT PACEMAKER INSERTION;  Surgeon: Thompson Grayer, MD;  Location: [facility] CATH LAB;  Service: Cardiovascular;  Laterality: N/A;  . RADIOACTIVE SEED IMPLANT    . TONSILLECTOMY  Home Medications    Prior to Admission medications   Medication Sig Start Date End Date Taking? Authorizing Provider  amLODipine (NORVASC) 5 MG tablet TAKE 1 TABLET(5 MG) BY MOUTH DAILY 09/10/18   Miquel Dunn, NP  atorvastatin (LIPITOR) 20 MG tablet Take 20 mg by mouth daily at 12 noon.  04/07/18   [provider]  flecainide (TAMBOCOR) 50 MG tablet Take 1 tablet (50 mg total) by mouth every 12 (twelve) hours. 06/09/18   Geradine Girt, DO  metoprolol tartrate (LOPRESSOR) 25 MG tablet Take 1 tablet (25 mg total)  by mouth 2 (two) times daily. 06/09/18   Geradine Girt, DO  Tamsulosin HCl (FLOMAX) 0.4 MG CAPS Take 0.4 mg by mouth at bedtime.     [provider]  XARELTO 20 MG TABS tablet Take 1 tablet (20 mg total) by mouth daily with supper. 06/09/18   Geradine Girt, DO  XTANDI 40 MG capsule Take 80 mg by mouth 2 (two) times daily. Take 80 mg by mouth two times a day- 5 PM and 9 PM 05/26/18   [provider]    Family History Family History  Problem Relation Age of Onset  . Leukemia Mother   . Dementia Father     Social History Social History   Tobacco Use  . Smoking status: Former Smoker    Packs/day: 0.50    Years: 50.00    Pack years: 25.00    Types: Cigarettes    Quit date: 07/20/2009    Years since quitting: 9.2  . Smokeless tobacco: Never Used  Substance Use Topics  . Alcohol use: No  . Drug use: No     Allergies   Xarelto [rivaroxaban]   Review of Systems Review of Systems  Constitutional: Positive for appetite change and fatigue. Negative for chills and fever.  HENT: Negative for sore throat and trouble swallowing.   Eyes: Negative for visual disturbance.  Respiratory: Negative for cough and shortness of breath.   Cardiovascular: Negative for chest pain and palpitations.  Gastrointestinal: Negative for abdominal pain, constipation, diarrhea, nausea and vomiting.  Genitourinary: Positive for hematuria. Negative for dysuria and flank pain.  Musculoskeletal: Negative for back pain and neck pain.  Skin: Negative for rash and wound.  Neurological: Positive for syncope, weakness and light-headedness. Negative for speech difficulty and headaches.  All other systems reviewed and are negative.    Physical Exam Updated Vital Signs BP (!) 126/53   Resp 17   Physical Exam Vitals signs and nursing note reviewed.  Constitutional:      Appearance: Normal appearance. He is well-developed.  HENT:     Head: Normocephalic and atraumatic.     Comments: No  facial asymmetry.    Nose: Nose normal.  Eyes:     Pupils: Pupils are equal, round, and reactive to light.  Neck:     Musculoskeletal: Normal range of motion and neck supple. No neck rigidity or muscular tenderness.  Cardiovascular:     Rate and Rhythm: Normal rate and regular rhythm.     Heart sounds: No murmur. No friction rub. No gallop.   Pulmonary:     Effort: Pulmonary effort is normal. No respiratory distress.     Breath sounds: Normal breath sounds. No stridor. No wheezing, rhonchi or rales.  Chest:     Chest wall: No tenderness.  Abdominal:     General: Bowel sounds are normal.     Palpations: Abdomen is soft.  Tenderness: There is no abdominal tenderness. There is no guarding or rebound.  Musculoskeletal: Normal range of motion.        General: No swelling, tenderness, deformity or signs of injury.     Right lower leg: No edema.  Lymphadenopathy:     Cervical: No cervical adenopathy.  Skin:    General: Skin is warm and dry.     Capillary Refill: Capillary refill takes less than 2 seconds.     Findings: No erythema or rash.  Neurological:     General: No focal deficit present.     Mental Status: He is alert and oriented to person, place, and time.     Comments: 5/5 motor in all extremities.  Sensation intact.  Psychiatric:        Behavior: Behavior normal.      ED Treatments / Results  Labs (all labs ordered are listed, but only abnormal results are displayed) Labs Reviewed  CBC WITH DIFFERENTIAL/PLATELET - Abnormal; Notable for the following components:      Result Value   WBC 10.8 (*)    Neutro Abs 8.0 (*)    Monocytes Absolute 1.1 (*)    All other components within normal limits  COMPREHENSIVE METABOLIC PANEL - Abnormal; Notable for the following components:   Sodium 129 (*)    Potassium 3.3 (*)    Chloride 93 (*)    CO2 17 (*)    Glucose, Bld 185 (*)    BUN 90 (*)    Creatinine, Ser 4.81 (*)    Total Bilirubin 1.3 (*)    GFR calc non Af Amer 11  (*)    GFR calc Af Amer 12 (*)    Anion gap 19 (*)    All other components within normal limits  MAGNESIUM - Abnormal; Notable for the following components:   Magnesium 2.8 (*)    All other components within normal limits  TROPONIN I (HIGH SENSITIVITY) - Abnormal; Notable for the following components:   Troponin I (High Sensitivity) 84 (*)    All other components within normal limits  NOVEL CORONAVIRUS, NAA (HOSPITAL ORDER, SEND-OUT TO REF LAB)  T4, FREE  TSH  URINALYSIS, ROUTINE W REFLEX MICROSCOPIC    EKG None  Radiology Ct Head Wo Contrast  Result Date: 10/27/2018 CLINICAL DATA:  Head trauma EXAM: CT HEAD WITHOUT CONTRAST TECHNIQUE: Contiguous axial images were obtained from the base of the skull through the vertex without intravenous contrast. COMPARISON:  11/20/2011 FINDINGS: Brain: No acute territorial infarction, hemorrhage or intracranial mass. Mild atrophy. Slight ventricular prominence, likely due to atrophy. Stable size. Vascular: No hyperdense vessels. Scattered calcifications at the carotid siphons Skull: Normal. Negative for fracture or focal lesion. Sinuses/Orbits: No acute finding. Other: None IMPRESSION: 1. No CT evidence for acute intracranial abnormality. 2. Atrophy Electronically Signed   By: Donavan Foil M.D.   On: 10/27/2018 17:16   Dg Chest Port 1 View  Result Date: 10/27/2018 CLINICAL DATA:  Weakness EXAM: PORTABLE CHEST 1 VIEW COMPARISON:  06/07/2018 FINDINGS: Hyperinflated lungs. Left-sided pacing device as before. No focal consolidation or effusion. Normal cardiomediastinal silhouette. No pneumothorax. IMPRESSION: No active disease. Electronically Signed   By: Donavan Foil M.D.   On: 10/27/2018 16:42    Procedures Procedures (including critical care time)  Medications Ordered in ED Medications  sodium chloride 0.9 % bolus 500 mL (0 mLs Intravenous Stopped 10/27/18 1649)     Initial Impression / Assessment and Plan / ED Course  I have reviewed the  triage vital signs and the nursing notes.  Pertinent labs & imaging results that were available during my care of the patient were reviewed by me and considered in my medical decision making (see chart for details).        Patient intermittently going into narrow complex tachycardia with rates in the 150s to 170s.  These convert spontaneously back to normal sinus rhythm.  Spoke with Dr. Einar Gip who reviewed patient's EKG.  Think likely a flutter though it could be SVT.  Will consult on patient.  Patient has a new AKI.  Discussed with hospitalist will admit. Final Clinical Impressions(s) / ED Diagnoses   Final diagnoses:  AKI (acute kidney injury) ([place])  Syncope and collapse    ED Discharge Orders    None       Julianne Rice, MD 10/27/18 1743

## 2018-10-27 NOTE — Progress Notes (Signed)
I am aware of patient's admission with acute renal failure and dizziness, also found to have paroxysmal episodes of atrial flutter with rapid ventricular response, will increase flecainide to 100 mg p.o. b.i.d.  I'll see the patient in the morning.

## 2018-10-28 DIAGNOSIS — R7989 Other specified abnormal findings of blood chemistry: Secondary | ICD-10-CM

## 2018-10-28 DIAGNOSIS — E871 Hypo-osmolality and hyponatremia: Secondary | ICD-10-CM

## 2018-10-28 DIAGNOSIS — R55 Syncope and collapse: Principal | ICD-10-CM

## 2018-10-28 DIAGNOSIS — I4892 Unspecified atrial flutter: Secondary | ICD-10-CM

## 2018-10-28 DIAGNOSIS — R630 Anorexia: Secondary | ICD-10-CM

## 2018-10-28 DIAGNOSIS — E872 Acidosis: Secondary | ICD-10-CM

## 2018-10-28 DIAGNOSIS — Z8546 Personal history of malignant neoplasm of prostate: Secondary | ICD-10-CM

## 2018-10-28 DIAGNOSIS — E876 Hypokalemia: Secondary | ICD-10-CM

## 2018-10-28 LAB — COMPREHENSIVE METABOLIC PANEL
ALT: 13 U/L (ref 0–44)
AST: 26 U/L (ref 15–41)
Albumin: 3.3 g/dL — ABNORMAL LOW (ref 3.5–5.0)
Alkaline Phosphatase: 74 U/L (ref 38–126)
Anion gap: 13 (ref 5–15)
BUN: 80 mg/dL — ABNORMAL HIGH (ref 8–23)
CO2: 20 mmol/L — ABNORMAL LOW (ref 22–32)
Calcium: 8.7 mg/dL — ABNORMAL LOW (ref 8.9–10.3)
Chloride: 99 mmol/L (ref 98–111)
Creatinine, Ser: 2.96 mg/dL — ABNORMAL HIGH (ref 0.61–1.24)
GFR calc Af Amer: 22 mL/min — ABNORMAL LOW (ref >60)
GFR calc non Af Amer: 19 mL/min — ABNORMAL LOW (ref >60)
Glucose, Bld: 139 mg/dL — ABNORMAL HIGH (ref 70–99)
Potassium: 3.2 mmol/L — ABNORMAL LOW (ref 3.5–5.1)
Sodium: 132 mmol/L — ABNORMAL LOW (ref 135–145)
Total Bilirubin: 1.7 mg/dL — ABNORMAL HIGH (ref 0.3–1.2)
Total Protein: 6.3 g/dL — ABNORMAL LOW (ref 6.5–8.1)

## 2018-10-28 LAB — CBC
HCT: 38.1 % — ABNORMAL LOW (ref 39.0–52.0)
Hemoglobin: 13.7 g/dL (ref 13.0–17.0)
MCH: 31.4 pg (ref 26.0–34.0)
MCHC: 36 g/dL (ref 30.0–36.0)
MCV: 87.4 fL (ref 80.0–100.0)
Platelets: 239 10*3/uL (ref 150–400)
RBC: 4.36 MIL/uL (ref 4.22–5.81)
RDW: 12.6 % (ref 11.5–15.5)
WBC: 8.9 10*3/uL (ref 4.0–10.5)
nRBC: 0 % (ref 0.0–0.2)

## 2018-10-28 LAB — URINE CULTURE: Culture: <10000 — AB

## 2018-10-28 LAB — NOVEL CORONAVIRUS, NAA (HOSP ORDER, SEND-OUT TO REF LAB; TAT 18-24 HRS): SARS-CoV-2, NAA: NOT DETECTED

## 2018-10-28 LAB — PROTIME-INR
INR: 1.3 — ABNORMAL HIGH (ref 0.8–1.2)
Prothrombin Time: 15.9 seconds — ABNORMAL HIGH (ref 11.4–15.2)

## 2018-10-28 MED ORDER — ADULT MULTIVITAMIN W/MINERALS CH
1.0000 | ORAL_TABLET | Freq: Every day | ORAL | Status: DC
  Administered 2018-10-28 – 2018-10-29 (×2): 1 via ORAL
  Filled 2018-10-28 (×2): qty 1

## 2018-10-28 MED ORDER — NEPRO/CARBSTEADY PO LIQD: 237.0000 mL | Freq: Two times a day (BID) | ORAL | Status: DC

## 2018-10-28 MED ORDER — METOPROLOL TARTRATE 25 MG PO TABS
25.0000 mg | ORAL_TABLET | Freq: Two times a day (BID) | ORAL | Status: DC
  Administered 2018-10-28 – 2018-10-29 (×3): 25 mg via ORAL
  Filled 2018-10-28 (×3): qty 1

## 2018-10-28 MED ORDER — ENZALUTAMIDE 40 MG PO CAPS: 80.0000 mg | ORAL_CAPSULE | Freq: Two times a day (BID) | ORAL | Status: DC

## 2018-10-28 MED ORDER — TAMSULOSIN HCL 0.4 MG PO CAPS
0.4000 mg | ORAL_CAPSULE | Freq: Every day | ORAL | Status: DC
  Administered 2018-10-28: 22:00:00 0.4 mg via ORAL
  Filled 2018-10-28: qty 1

## 2018-10-28 MED ORDER — ATORVASTATIN CALCIUM 10 MG PO TABS
20.0000 mg | ORAL_TABLET | Freq: Every day | ORAL | Status: DC
  Administered 2018-10-28 – 2018-10-29 (×2): 20 mg via ORAL
  Filled 2018-10-28 (×2): qty 2

## 2018-10-28 MED ORDER — POTASSIUM CHLORIDE CRYS ER 20 MEQ PO TBCR
40.0000 meq | EXTENDED_RELEASE_TABLET | ORAL | Status: AC
  Administered 2018-10-28 (×2): 40 meq via ORAL
  Filled 2018-10-28 (×2): qty 2

## 2018-10-28 MED ORDER — BOOST / RESOURCE BREEZE PO LIQD CUSTOM
1.0000 | Freq: Two times a day (BID) | ORAL | Status: DC
  Administered 2018-10-28 – 2018-10-29 (×3): 1 via ORAL

## 2018-10-28 MED ORDER — ENZALUTAMIDE 40 MG PO CAPS
80.0000 mg | ORAL_CAPSULE | ORAL | Status: DC
  Administered 2018-10-28 – 2018-10-29 (×3): 80 mg via ORAL
  Filled 2018-10-28 (×3): qty 2

## 2018-10-28 MED ORDER — AMLODIPINE BESYLATE 2.5 MG PO TABS
5.0000 mg | ORAL_TABLET | Freq: Every day | ORAL | Status: DC
  Administered 2018-10-28 – 2018-10-29 (×2): 5 mg via ORAL
  Filled 2018-10-28 (×2): qty 2

## 2018-10-28 MED ORDER — PRO-STAT SUGAR FREE PO LIQD
30.0000 mL | Freq: Two times a day (BID) | ORAL | Status: DC
  Administered 2018-10-28 – 2018-10-29 (×3): 30 mL via ORAL
  Filled 2018-10-28 (×2): qty 30

## 2018-10-28 NOTE — Consult Note (Signed)
CARDIOLOGY CONSULT NOTE  Patient ID: [name] [name] MRN: [id number] DOB/AGE: 1936/01/28 83 y.o.  Admit date: 10/27/2018 Referring Physician  [facility] hospitalist Primary Physician:  [name], MD Reason for Consultation  Tachycardia, near syncope  HPI:  83 y.o. male  with AAM with history of paroxysmal atrial flutter status post ablation, sick sinus syndrome s/p pacemaker implantation 2013, prostate cancer, admitted with lightheadedness.  Patient reports episodes of flushing sensation and perspiration.  Reports near syncopal episodes that are typically associated with going to the bathroom.  He denies any chest pain, shortness of breath.  Pacemaker transmission shows episodes of atrial flutter with RVR, but no VT/VF.  He is found ot have elevated Cr >4. He has been out of his flecainide for at least a month.   Past Medical History:  Diagnosis Date  . AKI (acute kidney injury) ([place]) 06/08/2018  . Atrial flutter ([place])   . Carcinoma of prostate ([place]) 12/30/2011   Treated with seed/radiation therapy.   . Colostomy in place [facility])   . Difficulty sleeping    lives in shelter currently  . Diverticulitis   . Dyslipidemia 12/30/2011  . Encounter for care of pacemaker 06/17/2018  . Frequency of urination   . Heart murmur   . Hypertension 04/30/11   Cardioversion 06/16/11  . Near syncope 06/07/2018  . Nocturia   . Orthostatic hypotension 06/08/2018  . Pacemaker 7/13    sick sinus syndrome/St Jude pacemaker  . Paroxysmal atrial flutter ([place]) 06/16/2011  . Prostate cancer ([place])   . Syncope and collapse 11/20/2011   Atrial and ventricular standstill > 3 seconds.   . Thyroid disease    had "overactive thyroid in 1997" - no known problem since    Past Surgical History:  Procedure Laterality Date  . ATRIAL FLUTTER ABLATION N/A 11/21/2011   Procedure: ATRIAL FLUTTER ABLATION;  Surgeon: Thompson Grayer, MD;  Location: [facility] CATH LAB;  Service: Cardiovascular;  Laterality: N/A;  . CARDIAC  ELECTROPHYSIOLOGY STUDY AND ABLATION  7/13  . CARDIOVERSION  06/16/2011   Procedure: CARDIOVERSION;  Surgeon: Laverda Page, MD;  Location: [facility];  Service: Cardiovascular;  Laterality: N/A;  . COLON SURGERY  05/13/2013  . COLOSTOMY CLOSURE N/A 11/25/2013   Procedure: COLOSTOMY CLOSURE;  Surgeon: Earnstine Regal, MD;  Location: [facility] ORS;  Service: General;  Laterality: N/A;  . COLOSTOMY CLOSURE  11/25/2013  . PACEMAKER INSERTION  11/28/11   SJM Accent DR RF implanted by Dr Rayann Heman  . PARTIAL COLECTOMY N/A 05/13/2013   Procedure: sigmoid COLECTOMY  colostomy ;  Surgeon: Earnstine Regal, MD;  Location: [facility] ORS;  Service: General;  Laterality: N/A;  . PERMANENT PACEMAKER INSERTION N/A 11/28/2011   Procedure: PERMANENT PACEMAKER INSERTION;  Surgeon: Thompson Grayer, MD;  Location: [facility] CATH LAB;  Service: Cardiovascular;  Laterality: N/A;  . RADIOACTIVE SEED IMPLANT    . TONSILLECTOMY      Social History   Socioeconomic History  . Marital status: Single    Spouse name: Not on file  . Number of children: 6  . Years of education: Not on file  . Highest education level: Not on file  Occupational History  . Not on file  Social Needs  . Financial resource strain: Not on file  . Food insecurity    Worry: Not on file    Inability: Not on file  . Transportation needs    Medical: Not on file    Non-medical: Not on file  Tobacco Use  . Smoking status: Former  Smoker    Packs/day: 0.50    Years: 50.00    Pack years: 25.00    Types: Cigarettes    Quit date: 07/20/2009    Years since quitting: 9.2  . Smokeless tobacco: Never Used  Substance and Sexual Activity  . Alcohol use: No  . Drug use: No  . Sexual activity: Never  Lifestyle  . Physical activity    Days per week: Not on file    Minutes per session: Not on file  . Stress: Not on file  Relationships  . Social Herbalist on phone: Not on file    Gets together: Not on file    Attends religious service: Not on file    Active member of  club or organization: Not on file    Attends meetings of clubs or organizations: Not on file    Relationship status: Not on file  . Intimate partner violence    Fear of current or ex partner: Not on file    Emotionally abused: Not on file    Physically abused: Not on file    Forced sexual activity: Not on file  Other Topics Concern  . Not on file  Social History Narrative  . Not on file    No current facility-administered medications on file prior to encounter.    Current Outpatient Medications on File Prior to Encounter  Medication Sig Dispense Refill  . amLODipine (NORVASC) 5 MG tablet TAKE 1 TABLET(5 MG) BY MOUTH DAILY (Patient taking differently: Take 5 mg by mouth daily. ) 30 tablet 3  . atorvastatin (LIPITOR) 20 MG tablet Take 20 mg by mouth daily at 12 noon.     . flecainide (TAMBOCOR) 50 MG tablet Take 1 tablet (50 mg total) by mouth every 12 (twelve) hours. 60 tablet 0  . metoprolol tartrate (LOPRESSOR) 25 MG tablet Take 1 tablet (25 mg total) by mouth 2 (two) times daily. 60 tablet 0  . Tamsulosin HCl (FLOMAX) 0.4 MG CAPS Take 0.4 mg by mouth at bedtime.     Alveda Reasons 20 MG TABS tablet Take 1 tablet (20 mg total) by mouth daily with supper. (Patient taking differently: Take 20 mg by mouth daily. ) 30 tablet 0  . XTANDI 40 MG capsule Take 80 mg by mouth 2 (two) times daily. Take 80 mg by mouth two times a day- 5 PM and 9 PM      Review of Systems  Constitution: Negative for decreased appetite, malaise/fatigue, weight gain and weight loss.  HENT: Negative for congestion.   Eyes: Negative for visual disturbance.  Cardiovascular: Negative for chest pain, dyspnea on exertion, leg swelling, palpitations and syncope.  Respiratory: Negative for cough.   Endocrine: Negative for cold intolerance.  Hematologic/Lymphatic: Does not bruise/bleed easily.  Skin: Negative for itching and rash.  Musculoskeletal: Negative for myalgias.  Gastrointestinal: Negative for abdominal pain,  nausea and vomiting.  Genitourinary: Negative for dysuria.  Neurological: Positive for light-headedness. Negative for dizziness and weakness.  Psychiatric/Behavioral: The patient is not nervous/anxious.   All other systems reviewed and are negative.      Objective:  Blood pressure (!) 118/54, pulse (!) 50, temperature 97.8 F (36.6 C), temperature source Oral, resp. rate 18, height 5\' 7"  (1.702 m), weight 71.1 kg, SpO2 100 %. Body mass index is 24.54 kg/m.  Physical Exam  Constitutional: He is oriented to person, place, and time. He appears well-developed and well-nourished. No distress.  HENT:  Head: Normocephalic and atraumatic.  Eyes: Pupils are equal, round, and reactive to light. Conjunctivae are normal.  Neck: No JVD present.  Cardiovascular: Normal rate, regular rhythm and intact distal pulses.  Pulmonary/Chest: Effort normal and breath sounds normal. He has no wheezes. He has no rales.  Abdominal: Soft. Bowel sounds are normal. There is no rebound.  Musculoskeletal:        General: No edema.  Lymphadenopathy:    He has no cervical adenopathy.  Neurological: He is alert and oriented to person, place, and time. No cranial nerve deficit.  Skin: Skin is warm and dry.  Psychiatric: He has a normal mood and affect.  Nursing note and vitals reviewed.   Radiology: Ct Head Wo Contrast  Result Date: 10/27/2018 CLINICAL DATA:  Head trauma EXAM: CT HEAD WITHOUT CONTRAST TECHNIQUE: Contiguous axial images were obtained from the base of the skull through the vertex without intravenous contrast. COMPARISON:  11/20/2011 FINDINGS: Brain: No acute territorial infarction, hemorrhage or intracranial mass. Mild atrophy. Slight ventricular prominence, likely due to atrophy. Stable size. Vascular: No hyperdense vessels. Scattered calcifications at the carotid siphons Skull: Normal. Negative for fracture or focal lesion. Sinuses/Orbits: No acute finding. Other: None IMPRESSION: 1. No CT evidence  for acute intracranial abnormality. 2. Atrophy Electronically Signed   By: Donavan Foil M.D.   On: 10/27/2018 17:16   US Renal  Result Date: 10/27/2018 CLINICAL DATA:  83 year old male with acute renal insufficiency. EXAM: RENAL / URINARY TRACT ULTRASOUND COMPLETE COMPARISON:  Renal ultrasound dated 06/07/2018 FINDINGS: Right Kidney: Renal measurements: 7.5 x 3.6 x 4.3 cm = volume: 62 mL. Mild renal parenchyma atrophy. Normal echogenicity. No hydronephrosis or shadowing stone. Left Kidney: Renal measurements: 9.4 x 5.0 x 4.3 cm = volume: 10.5 mL. Mild parenchyma atrophy. Normal echogenicity. No hydronephrosis or shadowing stone. Bladder: Appears normal for degree of bladder distention. IMPRESSION: Mildly atrophic kidneys.  No hydronephrosis or shadowing stone. Electronically Signed   By: Anner Crete M.D.   On: 10/27/2018 22:35   Dg Chest Port 1 View  Result Date: 10/27/2018 CLINICAL DATA:  Weakness EXAM: PORTABLE CHEST 1 VIEW COMPARISON:  06/07/2018 FINDINGS: Hyperinflated lungs. Left-sided pacing device as before. No focal consolidation or effusion. Normal cardiomediastinal silhouette. No pneumothorax. IMPRESSION: No active disease. Electronically Signed   By: Donavan Foil M.D.   On: 10/27/2018 16:42    Laboratory Examination:  CMP Latest Ref Rng & Units 10/27/2018 06/09/2018 06/08/2018  Glucose 70 - 99 mg/dL 185(H) 103(H) 117(H)  BUN 8 - 23 mg/dL 90(H) 25(H) 43(H)  Creatinine 0.61 - 1.24 mg/dL 4.81(H) 1.47(H) 1.95(H)  Sodium 135 - 145 mmol/L 129(L) 142 141  Potassium 3.5 - 5.1 mmol/L 3.3(L) 3.8 4.0  Chloride 98 - 111 mmol/L 93(L) 112(H) 113(H)  CO2 22 - 32 mmol/L 17(L) 23 21(L)  Calcium 8.9 - 10.3 mg/dL 9.5 8.9 8.4(L)  Total Protein 6.5 - 8.1 g/dL 8.1 - -  Total Bilirubin 0.3 - 1.2 mg/dL 1.3(H) - -  Alkaline Phos 38 - 126 U/L 106 - -  AST 15 - 41 U/L 31 - -  ALT 0 - 44 U/L 13 - -   CBC Latest Ref Rng & Units 10/28/2018 10/27/2018 06/09/2018  WBC 4.0 - 10.5 K/uL 8.9 10.8(H) 7.0   Hemoglobin 13.0 - 17.0 g/dL 13.7 16.4 12.5(L)  Hematocrit 39.0 - 52.0 % 38.1(L) 46.3 36.8(L)  Platelets 150 - 400 K/uL 239 275 216   Lipid Panel  No results found for: CHOL, TRIG, HDL, CHOLHDL, VLDL, LDLCALC, LDLDIRECT HEMOGLOBIN A1C Lab  Results  Component Value Date   HGBA1C 6.2 (H) 11/21/2011   MPG 131 (H) 11/21/2011   TSH Recent Labs    06/09/18 0216 10/27/18 1535  TSH 0.884 0.361   Cardiac Panel (last 3 results) No results for input(s): CKTOTAL, CKMB, TROPONINI, RELINDX in the last 72 hours. Scheduled Meds: . feeding supplement (NEPRO CARB STEADY)  237 mL Oral BID BM  . heparin injection (subcutaneous)  5,000 Units Subcutaneous Q8H   Continuous Infusions: . sodium chloride 100 mL/hr at 10/27/18 2052   PRN Meds:. Medications Discontinued During This Encounter  Medication Reason  . heparin injection 5,000 Units   . feeding supplement (NEPRO CARB STEADY) liquid 237 mL   . flecainide (TAMBOCOR) tablet 100 mg    Current Meds  Medication Sig  . amLODipine (NORVASC) 5 MG tablet TAKE 1 TABLET(5 MG) BY MOUTH DAILY (Patient taking differently: Take 5 mg by mouth daily. )  . atorvastatin (LIPITOR) 20 MG tablet Take 20 mg by mouth daily at 12 noon.   . flecainide (TAMBOCOR) 50 MG tablet Take 1 tablet (50 mg total) by mouth every 12 (twelve) hours.  . metoprolol tartrate (LOPRESSOR) 25 MG tablet Take 1 tablet (25 mg total) by mouth 2 (two) times daily.  . Tamsulosin HCl (FLOMAX) 0.4 MG CAPS Take 0.4 mg by mouth at bedtime.   Alveda Reasons 20 MG TABS tablet Take 1 tablet (20 mg total) by mouth daily with supper. (Patient taking differently: Take 20 mg by mouth daily. )  . XTANDI 40 MG capsule Take 80 mg by mouth 2 (two) times daily. Take 80 mg by mouth two times a day- 5 PM and 9 PM     Cardiac studies:   EKG 10/27/2018: Atrial flutter with RVR  Echocardiogram 06/08/2018 :   1. The left ventricle has hyperdynamic systolic function of >26%. The cavity size is normal. There is  no increased left ventricular wall thickness. Echo evidence of normal diastolic filling patterns. Normal left ventricular filling pressures.  2. The aortic valve is tricuspid in structure. There is mild thickening and mild calcification of the aortic valve.   Assessment & Recommendations:  83 y.o. male  with AAM with history of paroxysmal atrial flutter status post ablation, sick sinus syndrome s/p pacemaker implantation 2013, prostate cancer, admitted with lightheadedness.  Lightheadedness/near syncopal episodes: Appears vasovagal/micturition triggered autonomic dysfunction. Encourage liberal hydration and compression stockings. I do not think his intermittent episodes of atrial flutter with RVR may not directly explain his symptoms.  He had been off flecainide for a month prior to admission. He did get 100 mg X2 this admission. Given his renal dysfunction, recommend stopping flecainide. Continue metoprolol 25 mg bid. Monitor for recurrence of RVR episodes/syncope symptoms during hospital stay.  He reportedly has flutter episodes on pacemaker transmission.  CHA2DS2-VASc Score is 3 with yearly risk of stroke of 3.2 %. Given his renal dysfunction, recommend stopping Xarelto. Add eliquis 2.5 mg bid on discharge, once renal function improves.   AKI/CKD: Management per primary team.   Nigel Mormon, MD [facility] [place] Pager: [phone] Office: [phone] If no answer Cell [phone]

## 2018-10-28 NOTE — Progress Notes (Signed)
PROGRESS NOTE  [name] [name] JHE:[id number] DOB: Jul 08, 1935 DOA: 10/27/2018 PCP: [name], MD   LOS: 1 day   Patient is from: Home  Brief Narrative / Interim history: 83 year old male with history of prostate cancer on chemotherapy, LUTS, hematuria, CKD-3, atrial flutter, dyslipidemia, orthostatic hypotension and sick sinus syndrome status post Surgisite [place] pacemaker presenting with near syncope, poor p.o. intake, hematuria and SVT.  In ED, noted to have short episode of narrow complex tachycardia now returned to sinus rhythm. WBC 11.  Sodium 129.  Potassium 3.3.  Bicarb 17 with anion gap of 19.  Creatinine 4.81.  BUN 90.  Troponin 84.  EKG SVT with ST depression in inferior leads.  CXR without acute finding.  Gait without acute finding.  Renal ultrasound consistent with CKD without hydronephrosis.  Cardiology consulted.  Started on IV fluid and hospital service was called for admission.  Subjective: No major events overnight of this morning.  Denies chest pain, dyspnea, palpitation, dizziness or GI symptoms.  Objective: Vitals:   10/28/18 0144 10/28/18 0440 10/28/18 0723 10/28/18 1111  BP:  140/71 (!) 118/54 129/71  Pulse:  60 (!) 50 (!) 50  Resp:  20 18 18   Temp:  97.8 F (36.6 C) 97.8 F (36.6 C) 98.3 F (36.8 C)  TempSrc:  Oral Oral Oral  SpO2:  99% 100% 98%  Weight: 71.1 kg     Height:        Intake/Output Summary (Last 24 hours) at 10/28/2018 1124 Last data filed at 10/28/2018 1031 Gross per 24 hour  Intake 1192.56 ml  Output 400 ml  Net 792.56 ml   Filed Weights   10/27/18 2016 10/28/18 0144  Weight: 70.3 kg 71.1 kg    Examination:  GENERAL: No acute distress.  Appears well.  HEENT: MMM.  Vision and hearing grossly intact.  NECK: Supple.  No JVD.  LUNGS:  No IWOB. Good air movement bilaterally. HEART:  RRR. Heart sounds normal.  ABD: Bowel sounds present. Soft. Non tender.  MSK/EXT:  Moves all extremities. No apparent deformity. No edema bilaterally.   SKIN: no apparent skin lesion or wound NEURO: Awake, alert and oriented appropriately.  No gross deficit.  PSYCH: Calm. Normal affect.    I have personally reviewed the following labs and images: CBC: Recent Labs  Lab 10/27/18 1535 10/28/18 0630  WBC 10.8* 8.9  NEUTROABS 8.0*  --   HGB 16.4 13.7  HCT 46.3 38.1*  MCV 87.9 87.4  PLT 275 185   Basic Metabolic Panel: Recent Labs  Lab 10/27/18 1535 10/28/18 0630  NA 129* 132*  K 3.3* 3.2*  CL 93* 99  CO2 17* 20*  GLUCOSE 185* 139*  BUN 90* 80*  CREATININE 4.81* 2.96*  CALCIUM 9.5 8.7*  MG 2.8*  --    GFR: Estimated Creatinine Clearance: 18.6 mL/min (A) (by C-G formula based on SCr of 2.96 mg/dL (H)). Liver Function Tests: Recent Labs  Lab 10/27/18 1535 10/28/18 0630  AST 31 26  ALT 13 13  ALKPHOS 106 74  BILITOT 1.3* 1.7*  PROT 8.1 6.3*  ALBUMIN 4.2 3.3*   No results for input(s): LIPASE, AMYLASE in the last 168 hours. No results for input(s): AMMONIA in the last 168 hours. Coagulation Profile: Recent Labs  Lab 10/28/18 0630  INR 1.3*   Cardiac Enzymes: No results for input(s): CKTOTAL, CKMB, CKMBINDEX, TROPONINI in the last 168 hours. BNP (last 3 results) No results for input(s): PROBNP in the last 8760 hours. HbA1C: No results for  input(s): HGBA1C in the last 72 hours. CBG: No results for input(s): GLUCAP in the last 168 hours. Lipid Profile: No results for input(s): CHOL, HDL, LDLCALC, TRIG, CHOLHDL, LDLDIRECT in the last 72 hours. Thyroid Function Tests: Recent Labs    10/27/18 1535  TSH 0.361  FREET4 1.01   Anemia Panel: No results for input(s): VITAMINB12, FOLATE, FERRITIN, TIBC, IRON, RETICCTPCT in the last 72 hours. Urine analysis:    Component Value Date/Time   COLORURINE AMBER (A) 10/27/2018 1745   APPEARANCEUR TURBID (A) 10/27/2018 1745   LABSPEC 1.018 10/27/2018 1745   PHURINE 6.0 10/27/2018 1745   GLUCOSEU NEGATIVE 10/27/2018 1745   HGBUR MODERATE (A) 10/27/2018 1745    BILIRUBINUR NEGATIVE 10/27/2018 1745   KETONESUR NEGATIVE 10/27/2018 1745   PROTEINUR 100 (A) 10/27/2018 1745   UROBILINOGEN 1.0 04/10/2013 0706   NITRITE NEGATIVE 10/27/2018 1745   LEUKOCYTESUR NEGATIVE 10/27/2018 1745   Sepsis Labs: Invalid input(s): PROCALCITONIN, LACTICIDVEN  No results found for this or any previous visit (from the past 240 hour(s)).   Radiology Studies: Ct Head Wo Contrast  Result Date: 10/27/2018 CLINICAL DATA:  Head trauma EXAM: CT HEAD WITHOUT CONTRAST TECHNIQUE: Contiguous axial images were obtained from the base of the skull through the vertex without intravenous contrast. COMPARISON:  11/20/2011 FINDINGS: Brain: No acute territorial infarction, hemorrhage or intracranial mass. Mild atrophy. Slight ventricular prominence, likely due to atrophy. Stable size. Vascular: No hyperdense vessels. Scattered calcifications at the carotid siphons Skull: Normal. Negative for fracture or focal lesion. Sinuses/Orbits: No acute finding. Other: None IMPRESSION: 1. No CT evidence for acute intracranial abnormality. 2. Atrophy Electronically Signed   By: Donavan Foil M.D.   On: 10/27/2018 17:16   US Renal  Result Date: 10/27/2018 CLINICAL DATA:  83 year old male with acute renal insufficiency. EXAM: RENAL / URINARY TRACT ULTRASOUND COMPLETE COMPARISON:  Renal ultrasound dated 06/07/2018 FINDINGS: Right Kidney: Renal measurements: 7.5 x 3.6 x 4.3 cm = volume: 62 mL. Mild renal parenchyma atrophy. Normal echogenicity. No hydronephrosis or shadowing stone. Left Kidney: Renal measurements: 9.4 x 5.0 x 4.3 cm = volume: 10.5 mL. Mild parenchyma atrophy. Normal echogenicity. No hydronephrosis or shadowing stone. Bladder: Appears normal for degree of bladder distention. IMPRESSION: Mildly atrophic kidneys.  No hydronephrosis or shadowing stone. Electronically Signed   By: Anner Crete M.D.   On: 10/27/2018 22:35   Dg Chest Port 1 View  Result Date: 10/27/2018 CLINICAL DATA:  Weakness  EXAM: PORTABLE CHEST 1 VIEW COMPARISON:  06/07/2018 FINDINGS: Hyperinflated lungs. Left-sided pacing device as before. No focal consolidation or effusion. Normal cardiomediastinal silhouette. No pneumothorax. IMPRESSION: No active disease. Electronically Signed   By: Donavan Foil M.D.   On: 10/27/2018 16:42     Procedures:  None  Microbiology: [id number] pending. Urine culture pending.  Assessment & Plan: Near syncope/SVT: recurrent issue usually after voiding.  Suspect vasovagal.  He also has not been taking his flecainide.  Cannot exclude orthostatic hypotension given poor p.o. intake. He reportedly has flutter episodes on pacemaker transmission.  Mali vascular score 3. -Orthostatic vitals -Appreciate cardiology input-stop flecainide due to renal function and continue metoprolol -Change Xarelto to renally dosed Eliquis. -TED hose  AKI on CKD-3/azotemia: Suspect prerenal etiology.  He is not on nephrotoxic meds.  Renal function improving with hydration.  Renal ultrasound consistent with CKD. -Continue IV fluids -Nephrotoxic meds. -Continue monitoring  Anion gap metabolic acidosis: Likely due to AKI -Manage as above.  Hypokalemia: Likely due to IV fluid. -Replenish and recheck  Mild  leukocytosis: Suspect hemoconcentration versus infectious process.  Now resolved after IVF.  Anorexia: Not sure if this is related to his prostate cancer/chemotherapy. -Consult dietitian -Continue home boost. -Add multivitamin.  History of prostate cancer: on [place] -Continue home [place].  LUTS/hematuria: mod Hgb, 100 protein and few bacteria but no LE or nitrites -Changing Xarelto to Eliquis  -Outpatient follow-up with neurology.  Already has upcoming appointment  DVT prophylaxis: Eliquis Code Status: Full code Family Communication: Patient declined.  He would update his family. Disposition Plan: Remains inpatient for AKI and near syncope/SVT. Consultants: Cardiology  Antimicrobials:  Anti-infectives (From admission, onward)   None      Scheduled Meds: . feeding supplement  1 Container Oral BID BM  . feeding supplement (PRO-STAT SUGAR FREE 64)  30 mL Oral BID WC  . heparin injection (subcutaneous)  5,000 Units Subcutaneous Q8H  . multivitamin with minerals  1 tablet Oral Daily   Continuous Infusions: . sodium chloride 100 mL/hr at 10/27/18 2052   PRN Meds:.  Taye T. [place]  If 7PM-7AM, please contact night-coverage [email or web address] Password [facility] 10/28/2018, 11:24 AM

## 2018-10-28 NOTE — Progress Notes (Addendum)
Initial Nutrition Assessment  RD working remotely.  DOCUMENTATION CODES:   Not applicable  INTERVENTION:   -Downgrade diet to dysphagia 3 (advanced mechanical soft) for ease of intake -MVI with minerals daily -Boost Breeze po BID, each supplement provides 250 kcal and 9 grams of protein -D/c Nepro Shake po BID, each supplement provides 425 kcal and 19 grams protein -30 ml Prostat BID, each supplement 100 kcals and 15 grams protein  NUTRITION DIAGNOSIS:   Increased nutrient needs related to chronic illness(prostate cancer) as evidenced by estimated needs.  GOAL:   Patient will meet greater than or equal to 90% of their needs  MONITOR:   PO intake, Supplement acceptance, Labs, Weight trends, Skin, I & O's  REASON FOR ASSESSMENT:   Malnutrition Screening Tool    ASSESSMENT:   The patient is an 83 yr old man who carries a past medical history significant for prostate cancer, atrial flutter, Colostomy, dyslipidemia, orthostatic hypotension, sick sinus syndrome/s/p St. Jude pacemaker, and thyroid disease.The patient presented to [facility] with complaints of not being able to eat for about a week due to lack of appetite. He has been having to get up in the middle of the night to urinate. He states that he gets a flushed feeling and then he has to rush to go urinate. He states that many times when he gets to the bathroom he feels like he is going to pass out. When he feels like this, he lies down so he isn't injured of falls. He states that his urine when he starts to pee is yellow, but then turns bloody. He has been having generalized weakness and fatigue. He denies fevers, chill, nausea, vomiting, diarrhea, or constipation. He does not get short of breath, chest pain, or palpitations.  Pt admitted with near syncope, hematuria, SVT, and failure to thrive.   Reviewed I/O's: +853 ml x 24 hours  UOP: 100 ml x 24 hours   Attempted to call pt to obtain further history, however, no answer.    Per H&P, pt has had a decreased appetite for 1 week PTA. Reviewed meal completion records; PO: 50-90%.   Reviewed wt hx, pt has experienced a 7.9% wt loss over the past 3 months, which is significant for time frame. Given advanced age, weight loss, decreased appetite, and multiple co-morbidities, pt is at risk for malnutrition, however, RD unable to identify at this time.   Labs reviewed: Na: 132, K: 3.2.   NUTRITION - FOCUSED PHYSICAL EXAM:    Most Recent Value  Orbital Region  Unable to assess  Upper Arm Region  Unable to assess  Buccal Region  Unable to assess  Temple Region  Unable to assess  Clavicle Bone Region  Unable to assess  Clavicle and Acromion Bone Region  Unable to assess  Scapular Bone Region  Unable to assess  Dorsal Hand  Unable to assess  Patellar Region  Unable to assess  Anterior Thigh Region  Unable to assess  Posterior Calf Region  Unable to assess  Edema (RD Assessment)  Unable to assess  Hair  Unable to assess  Eyes  Unable to assess  Mouth  Unable to assess  Skin  Unable to assess  Nails  Unable to assess       Diet Order:   Diet Order            Diet renal with fluid restriction Fluid restriction: Other (see comments); Room service appropriate? Yes; Fluid consistency: Thin  Diet effective now  EDUCATION NEEDS:   No education needs have been identified at this time  Skin:  Skin Assessment: Reviewed RN Assessment  Last BM:  Unknown  Height:   Ht Readings from Last 1 Encounters:  10/27/18 5\' 7"  (1.702 m)    Weight:   Wt Readings from Last 1 Encounters:  10/28/18 71.1 kg    Ideal Body Weight:  67.3 kg  BMI:  Body mass index is 24.54 kg/m.  Estimated Nutritional Needs:   Kcal:  1700-1900  Protein:  85-100 grams  Fluid:  1.7-1.9 L    Caydn Justen A. Jimmye Norman, RD, LDN, [place] Registered Dietitian II Certified Diabetes Care and Education Specialist Pager: [phone] After hours Pager: [phone]

## 2018-10-28 NOTE — Evaluation (Signed)
Physical Therapy Evaluation Patient Details Name: [name] [name] MRN: [id number] DOB: [birth year] Today's Date: 10/28/2018   History of Present Illness  83 yr old man admitted with near syncope,hematuria, SVT, FTT. PMHx: colostomy, prostate cancer, atrial flutter, Colostomy, dyslipidemia, orthostatic hypotension, sick sinus syndrome/s/p St. Jude pacemaker, and thyroid disease.  Clinical Impression  Pt supine on arrival and pleasant with noted urine soaked gown and pants. Pt agreeable to linen change and performing orthostatic vitals but declined transfer OOB or walking stating he didn't sleep all night and is too fatigued to attempt. Pt with good ability with basic mobility and transfers with currently decreased activity tolerance, flat affect, unaware of day (no calendar) and will benefit from acute therapy to maximize mobility, gait safety and function to return home alone. Encouraged OOB for meals and ambulation with nursing assist.   Orthostatic BPs  Supine 128/57  Sitting 114/59     Standing 126/54  Standing after 3 min 103/60  HR 50-61     Follow Up Recommendations Home health PT(potentially)    Equipment Recommendations  None recommended by PT    Recommendations for Other Services       Precautions / Restrictions Precautions Precautions: None      Mobility  Bed Mobility Overal bed mobility: Independent                Transfers Overall transfer level: Independent               General transfer comment: pt able to stand from bed and sit with controlled descent. Stands with wide BOS. declined further mobility due to lack of sleep  Ambulation/Gait                Stairs            Wheelchair Mobility    Modified Rankin (Stroke Patients Only)       Balance Overall balance assessment: No apparent balance deficits (not formally assessed)                                           Pertinent Vitals/Pain Pain Assessment:  No/denies pain    Home Living Family/patient expects to be discharged to:: Private residence Living Arrangements: Alone Available Help at Discharge: Family;Available PRN/intermittently Type of Home: Apartment Home Access: Level entry     Home Layout: One level Home Equipment: None      Prior Function Level of Independence: Independent               Hand Dominance        Extremity/Trunk Assessment   Upper Extremity Assessment Upper Extremity Assessment: Overall WFL for tasks assessed    Lower Extremity Assessment Lower Extremity Assessment: Overall WFL for tasks assessed    Cervical / Trunk Assessment Cervical / Trunk Assessment: Normal  Communication   Communication: No difficulties  Cognition Arousal/Alertness: Awake/alert Behavior During Therapy: Flat affect Overall Cognitive Status: Within Functional Limits for tasks assessed                                        General Comments      Exercises     Assessment/Plan    PT Assessment Patient needs continued PT services  PT Problem List Decreased mobility;Decreased activity tolerance  PT Treatment Interventions DME instruction;Therapeutic activities;Gait training;Therapeutic exercise;Patient/family education;Functional mobility training;Balance training    PT Goals (Current goals can be found in the Care Plan section)  Acute Rehab PT Goals Patient Stated Goal: return home PT Goal Formulation: With patient Time For Goal Achievement: 11/11/18 Potential to Achieve Goals: Good    Frequency Min 3X/week   Barriers to discharge Decreased caregiver support      Co-evaluation               AM-PAC PT "6 Clicks" Mobility  Outcome Measure Help needed turning from your back to your side while in a flat bed without using bedrails?: None Help needed moving from lying on your back to sitting on the side of a flat bed without using bedrails?: None Help needed moving to and  from a bed to a chair (including a wheelchair)?: None Help needed standing up from a chair using your arms (e.g., wheelchair or bedside chair)?: None Help needed to walk in hospital room?: A Little Help needed climbing 3-5 steps with a railing? : A Little 6 Click Score: 22    End of Session   Activity Tolerance: Patient tolerated treatment well Patient left: in bed;with call [place]/phone within reach Nurse Communication: Mobility status PT Visit Diagnosis: Other abnormalities of gait and mobility (R26.89)    Time: 0881-1031 PT Time Calculation (min) (ACUTE ONLY): 17 min   Charges:   PT Evaluation $PT Eval Moderate Complexity: 1 Mod          [place] Pager: [phone] Office: [phone]   Maly Lemarr B Makhari Dovidio 10/28/2018, 12:09 PM

## 2018-10-29 DIAGNOSIS — E861 Hypovolemia: Secondary | ICD-10-CM

## 2018-10-29 DIAGNOSIS — I483 Typical atrial flutter: Secondary | ICD-10-CM

## 2018-10-29 DIAGNOSIS — Z950 Presence of cardiac pacemaker: Secondary | ICD-10-CM

## 2018-10-29 DIAGNOSIS — R61 Generalized hyperhidrosis: Secondary | ICD-10-CM

## 2018-10-29 DIAGNOSIS — Z45018 Encounter for adjustment and management of other part of cardiac pacemaker: Secondary | ICD-10-CM

## 2018-10-29 DIAGNOSIS — I441 Atrioventricular block, second degree: Secondary | ICD-10-CM

## 2018-10-29 DIAGNOSIS — I9589 Other hypotension: Secondary | ICD-10-CM

## 2018-10-29 DIAGNOSIS — E860 Dehydration: Secondary | ICD-10-CM

## 2018-10-29 LAB — BASIC METABOLIC PANEL
Anion gap: 10 (ref 5–15)
BUN: 57 mg/dL — ABNORMAL HIGH (ref 8–23)
CO2: 20 mmol/L — ABNORMAL LOW (ref 22–32)
Calcium: 8.4 mg/dL — ABNORMAL LOW (ref 8.9–10.3)
Chloride: 108 mmol/L (ref 98–111)
Creatinine, Ser: 2.13 mg/dL — ABNORMAL HIGH (ref 0.61–1.24)
GFR calc Af Amer: 33 mL/min — ABNORMAL LOW (ref >60)
GFR calc non Af Amer: 28 mL/min — ABNORMAL LOW (ref >60)
Glucose, Bld: 108 mg/dL — ABNORMAL HIGH (ref 70–99)
Potassium: 4.3 mmol/L (ref 3.5–5.1)
Sodium: 138 mmol/L (ref 135–145)

## 2018-10-29 LAB — CBC
HCT: 34.2 % — ABNORMAL LOW (ref 39.0–52.0)
Hemoglobin: 12.1 g/dL — ABNORMAL LOW (ref 13.0–17.0)
MCH: 31.4 pg (ref 26.0–34.0)
MCHC: 35.4 g/dL (ref 30.0–36.0)
MCV: 88.8 fL (ref 80.0–100.0)
Platelets: 207 10*3/uL (ref 150–400)
RBC: 3.85 MIL/uL — ABNORMAL LOW (ref 4.22–5.81)
RDW: 12.7 % (ref 11.5–15.5)
WBC: 7.7 10*3/uL (ref 4.0–10.5)
nRBC: 0 % (ref 0.0–0.2)

## 2018-10-29 LAB — MAGNESIUM: Magnesium: 2.1 mg/dL (ref 1.7–2.4)

## 2018-10-29 LAB — FLECAINIDE LEVEL: Flecainide: 0.18 ug/mL — ABNORMAL LOW (ref 0.20–1.00)

## 2018-10-29 MED ORDER — ADULT MULTIVITAMIN W/MINERALS CH: 1.0000 | ORAL_TABLET | Freq: Every day | ORAL | 0 refills | Status: DC

## 2018-10-29 MED ORDER — APIXABAN 2.5 MG PO TABS: 2.5000 mg | ORAL_TABLET | Freq: Two times a day (BID) | ORAL | 1 refills | Status: DC

## 2018-10-29 MED FILL — ELIQUIS 2.5 MG TABLET: 2.5 | 30 days supply | Qty: 60 | Fill #0

## 2018-10-29 NOTE — Care Management Important Message (Signed)
Important Message  Patient Details  Name: [name] [name] MRN: [id number] Date of Birth: 19-Jul-1935   Medicare Important Message Given:  Yes     Lynnea Vandervoort 10/29/2018, 2:50 PM

## 2018-10-29 NOTE — Progress Notes (Signed)
Subjective:  AAM with history of paroxysmal atrial flutter status post ablation and sick sinus syndrome and presently has a pacemaker implantation since 06/16/2011. He had paroxysmal episodes of sustained atrial fibrillation, first episode on 07/18/14 and placed on Flecainide and anticoagulation.  He is now admitted with fatigue, dizziness, syncope  and acute renal failure, had paroxysmal episodes of A. Fl with RVR short episodes and I was consulted for evaluation of the same and syncope. Now being treated with Xtendi for prostate cancer and has had recurrent  Episodes of hot flashes and profound diaphoresis followed by dizziness and syncope.   Intake/Output from previous day:  I/O last 3 completed shifts: In: 2116.6 [P.O.:1404; I.V.:712.6] Out: 1450 [Urine:1450] Total I/O In: 702 [P.O.:702] Out: 775 [Urine:775]  Blood pressure 117/62, pulse (!) 59, temperature 97.8 F (36.6 C), temperature source Oral, resp. rate 16, height 5' 7" (1.702 m), weight 73.1 kg, SpO2 100 %.  Orthostatic VS for the past 24 hrs (Last 3 readings):  BP- Lying Pulse- Lying BP- Sitting Pulse- Sitting BP- Standing at 0 minutes Pulse- Standing at 0 minutes BP- Standing at 3 minutes Pulse- Standing at 3 minutes  10/29/18 1320 118/53 50 104/50 50 96/48 70 101/54 60  10/29/18 0910 140/68 89 136/66 88 109/54 71 115/57 74     Physical Exam  Constitutional: He appears well-developed and well-nourished. No distress.  HENT:  Head: Atraumatic.  Eyes: Conjunctivae are normal.  Neck: Neck supple. No JVD present. No thyromegaly present.  Cardiovascular: Normal rate, regular rhythm, normal heart sounds and intact distal pulses. Exam reveals no gallop.  No murmur heard. Pulmonary/Chest: Effort normal and breath sounds normal.  Abdominal: Soft. Bowel sounds are normal.  Musculoskeletal: Normal range of motion.  Neurological: He is alert.  Skin: Skin is warm and dry.  Psychiatric: He has a normal mood and affect.    Lab  Results: BMP BNP (last 3 results) No results for input(s): BNP in the last 8760 hours.  ProBNP (last 3 results) No results for input(s): PROBNP in the last 8760 hours. BMP Latest Ref Rng & Units 10/29/2018 10/28/2018 10/27/2018  Glucose 70 - 99 mg/dL 108(H) 139(H) 185(H)  BUN 8 - 23 mg/dL 57(H) 80(H) 90(H)  Creatinine 0.61 - 1.24 mg/dL 2.13(H) 2.96(H) 4.81(H)  Sodium 135 - 145 mmol/L 138 132(L) 129(L)  Potassium 3.5 - 5.1 mmol/L 4.3 3.2(L) 3.3(L)  Chloride 98 - 111 mmol/L 108 99 93(L)  CO2 22 - 32 mmol/L 20(L) 20(L) 17(L)  Calcium 8.9 - 10.3 mg/dL 8.4(L) 8.7(L) 9.5   Hepatic Function Latest Ref Rng & Units 10/28/2018 10/27/2018 12/31/2011  Total Protein 6.5 - 8.1 g/dL 6.3(L) 8.1 5.8(L)  Albumin 3.5 - 5.0 g/dL 3.3(L) 4.2 2.5(L)  AST 15 - 41 U/L _0 ALT 0 - 44 U/L _1 Alk Phosphatase 38 - 126 U/L 74 106 52  Total Bilirubin 0.3 - 1.2 mg/dL 1.7(H) 1.3(H) 0.3   CBC Latest Ref Rng & Units 10/29/2018 10/28/2018 10/27/2018  WBC 4.0 - 10.5 K/uL 7.7 8.9 10.8(H)  Hemoglobin 13.0 - 17.0 g/dL 12.1(L) 13.7 16.4  Hematocrit 39.0 - 52.0 % 34.2(L) 38.1(L) 46.3  Platelets 150 - 400 K/uL 207 239 275   Lipid Panel  No results found for: CHOL, TRIG, HDL, CHOLHDL, VLDL, LDLCALC, LDLDIRECT Cardiac Panel (last 3 results) No results for input(s): CKTOTAL, CKMB, TROPONINI, RELINDX in the last 72 hours.  HEMOGLOBIN A1C Lab Results  Component Value Date   HGBA1C 6.2 (H) 11/21/2011  MPG 131 (H) 11/21/2011   TSH Recent Labs    06/09/18 0216 10/27/18 1535  TSH 0.884 0.361   Imaging: Imaging results have been reviewed  Cardiac Studies:  EKG: 10/27/2018: Atrial flutter with 2: 1 conduction with rapid ventricular response at the rate of 150 bpm.  No ST-T wave changes of ischemia..  Echocardiogram 06/08/2018 :   1. The left ventricle has hyperdynamic systolic function of >10%. The cavity size is normal. There is no increased left ventricular wall thickness. Echo evidence of normal diastolic  filling patterns. Normal left ventricular filling pressures.  2. The aortic valve is tricuspid in structure. There is mild thickening and mild calcification of the aortic valve.  In person pacemaker interrogation  10/27/2018: Paroxysmal episodes of non-sustained atrial tachycardia/atrial flutter, normal threshold and impedance of the leads, normal function.  Scheduled Meds: . amLODipine  5 mg Oral Daily  . atorvastatin  20 mg Oral Q1200  . enzalutamide  80 mg Oral 2 times per day  . feeding supplement  1 Container Oral BID BM  . heparin injection (subcutaneous)  5,000 Units Subcutaneous Q8H  . metoprolol tartrate  25 mg Oral BID  . multivitamin with minerals  1 tablet Oral Daily  . tamsulosin  0.4 mg Oral QHS   Continuous Infusions: . sodium chloride 100 mL/hr at 10/29/18 0419   PRN Meds:.  Assessment/Plan:  1.  Paroxysmal atrial flutter, very brief, and history of atrial flutter ablation in the past.  Essentially asymptomatic with regard to atrial flutter. CHA2DS2-VASc Score is 3.  Yearly risk of stroke: 3.2%.  Score of 1=1.3; 2=2.2; 3=3.2; 4=4; 5=6.7; 6=9.8; 7=>9.8) -(CHF; HTN; vasc disease DM,  Male = 1; Age <65 =0; 65-74 = 1,  >75 =2; stroke = 2).   Patient off of flecainide for the past 1 month.  Symptoms of dizziness is unrelated to atrial flutter. 2.  Syncope, related to dehydration and also vasodilatory properties of anticancer therapy for prostate cancer with Xtendi. 3.  Hypotension  Recommendation: Discontinue flecainide in view of acute renal failure.  Although function is improving, will probably recommend holding off on flecainide for now and I can continue to monitor his pacemaker and his symptoms with regard to atrial flutter.  We could also consider repeat EP evaluation.  He is asymptomatic with regard to atrial flutter.  But does need anticoagulation as his cardioembolic risk is at least 3.0.  Discontinue amlodipine, continue metoprolol 25 mg p.o. twice daily in the  orthostatic hypotension.  With regard to anticoagulation, restart Xarelto at 15 mg.  I am aware of the interaction but I am not sure if this is clinically significant.  Also patient has no bleeding diathesis.   Adrian Prows, M.D. 10/29/2018, 2:33 PM [facility] Pager: [phone] Office: [phone] If no answer: [phone]

## 2018-10-29 NOTE — Discharge Summary (Signed)
Physician Discharge Summary  [name] [name] YQI:[id number] DOB: 02-Oct-1935 DOA: 10/27/2018  PCP: [name], MD  Admit date: 10/27/2018 Discharge date: 10/29/2018  Admitted From: Home Disposition: Home  Recommendations for Outpatient Follow-up:  1. Follow up with PCP, neurology, cardiology in 1-2 weeks 2. Please obtain CBC/BMP/Mag at follow up 3. Please follow up on the following pending results: None  Home Health: None Equipment/Devices: None  Discharge Condition: Stable CODE STATUS: Full code  Hospital Course: 83 year old male with history of prostate cancer on Xtandi, LUTS, hematuria, CKD-3, atrial flutter, dyslipidemia, orthostatic hypotension and sick sinus syndrome status post [facility] [place] pacemaker presenting with ongoing perspiration/diaphoresis followed by near syncope for about three weeks. Also poor p.o. intake for about a week.  Reports hematuria.  Has not taken his flecainide for about a month.   In ED, noted to have short episode of narrow complex tachycardia now returned to sinus rhythm. WBC 11.  Sodium 129.  Potassium 3.3.  Bicarb 17 with anion gap of 19.  Creatinine 4.81.  BUN 90.  Troponin 84.  EKG SVT with ST depression in inferior leads.  CXR without acute finding.  Gait without acute finding.  Renal ultrasound consistent with CKD without hydronephrosis.  Cardiology consulted.  Started on IV fluid and admitted.  The next day, orthostatic vitals positive but not symptomatic.  Patient was continued on IV fluid for AKI.  Evaluated by cardiology.  Flecainide discontinued.  Renal function improved significantly with IV hydration.  Evaluated by dietitian about poor intake.  Started on multivitamin.  On the day of discharge, patient felt well and ready to go home.  Evaluated by PT/OT and no need was identified.  Amlodipine discontinued by cardiology.  Change Xarelto to renal dose Eliquis.   See individual pulmonary as below for more.  Discharge Diagnoses:  Near  syncope/SVT: recurrent issue usually after voiding for about 3 weeks.  Temporal association with Xtandi initiation.   Likely a combination of orthostasis in the setting of poor p.o. intake, vasovagal and possible arrhythmia in the setting of not taking his flecainide. He reportedly has flutter episodes on pacemaker transmission.  Mali vascular score 3. -Counseled on good hydration with Pedialyte -Flecainide and amlodipine discontinued per cardiology recommendation -Discharged on metoprolol and renally dosed Eliquis -TED hose  AKI on CKD-3/azotemia: suspect prerenal etiology.  He is not on nephrotoxic meds.  Renal function improved significantly with hydration. Renal ultrasound consistent with CKD. -Counseled on good hydration as above -Recheck renal function at follow-up  Anion gap metabolic acidosis: Likely due to AKI.  Anion gap closed.  Acidosis improved significantly.  Hypokalemia: Likely due to IV fluid.  Resolved.  Mild leukocytosis: Suspect hemoconcentration versus infectious process.  Now resolved after IVF.  Anorexia: Not sure if this is related to his prostate cancer/Xtandi. -Appreciate dietitian input. -Start a multivitamin.  History of prostate cancer: on Xtandi.  Could be contributing to his perspiration/diaphoresis. -Continue home [facility].  LUTS/hematuria: mod Hgb, 100 protein and few bacteria but no LE or nitrites -Changing Xarelto to Eliquis-renally dosed -Outpatient follow-up with urology.  Already has upcoming appointment  Discharge Instructions  Discharge Instructions    Call MD for:  difficulty breathing, headache or visual disturbances   Complete by: As directed    Call MD for:  extreme fatigue   Complete by: As directed    Call MD for:  persistant dizziness or light-headedness   Complete by: As directed    Call MD for:  persistant nausea and vomiting  Complete by: As directed    Call MD for:  temperature >100.4   Complete by: As directed    Diet -  low sodium heart healthy   Complete by: As directed    Discharge instructions   Complete by: As directed    It has been a pleasure taking care of you! You were admitted with symptoms of syncope (passing out), dehydration and kidney failure. Your kidney failure is likely due to dehydration from not eating and drinking well. It improved with fluids. We recommend getting some Pedialyte or making one as we discussed.   Please review your new medication list and take your medications as prescribed. Please call and schedule a follow-up appointment with your primary care doctor, urologist and cardiologist in 1 to 2 weeks.  Take care, Dr. Cyndia Skeeters   Increase activity slowly   Complete by: As directed      Allergies as of 10/29/2018      Reactions   Xarelto [rivaroxaban] Other (See Comments)   "I didn't like the way it made me feel" (patient didn't elaborate)      Medication List    STOP taking these medications   amLODipine 5 MG tablet Commonly known as: NORVASC   flecainide 50 MG tablet Commonly known as: TAMBOCOR   Xarelto 20 MG Tabs tablet Generic drug: rivaroxaban     TAKE these medications   apixaban 2.5 MG Tabs tablet Commonly known as: Eliquis Take 1 tablet (2.5 mg total) by mouth 2 (two) times daily.   atorvastatin 20 MG tablet Commonly known as: LIPITOR Take 20 mg by mouth daily at 12 noon.   metoprolol tartrate 25 MG tablet Commonly known as: LOPRESSOR Take 1 tablet (25 mg total) by mouth 2 (two) times daily.   multivitamin with minerals Tabs tablet Take 1 tablet by mouth daily.   tamsulosin 0.4 MG Caps capsule Commonly known as: FLOMAX Take 0.4 mg by mouth at bedtime.   Xtandi 40 MG capsule Generic drug: enzalutamide Take 80 mg by mouth 2 (two) times daily. Take 80 mg by mouth two times a day- 5 PM and 9 PM      Follow-up Information    [name], MD On 11/02/2018.   Specialty: Internal Medicine Why: @3 :00pm Tele Visit Contact information: [place] [address] [phone]        Adrian Prows, MD. Schedule an appointment as soon as possible for a visit in 3 week(s).   Specialty: Cardiology Contact information: [place] [address] [phone]           Consultations:  Cardiology  Procedures/Studies:  2D Echo: None  Ct Head Wo Contrast  Result Date: 10/27/2018 CLINICAL DATA:  Head trauma EXAM: CT HEAD WITHOUT CONTRAST TECHNIQUE: Contiguous axial images were obtained from the base of the skull through the vertex without intravenous contrast. COMPARISON:  11/20/2011 FINDINGS: Brain: No acute territorial infarction, hemorrhage or intracranial mass. Mild atrophy. Slight ventricular prominence, likely due to atrophy. Stable size. Vascular: No hyperdense vessels. Scattered calcifications at the carotid siphons Skull: Normal. Negative for fracture or focal lesion. Sinuses/Orbits: No acute finding. Other: None IMPRESSION: 1. No CT evidence for acute intracranial abnormality. 2. Atrophy Electronically Signed   By: Donavan Foil M.D.   On: 10/27/2018 17:16   US Renal  Result Date: 10/27/2018 CLINICAL DATA:  83 year old male with acute renal insufficiency. EXAM: RENAL / URINARY TRACT ULTRASOUND COMPLETE COMPARISON:  Renal ultrasound dated 06/07/2018 FINDINGS: Right Kidney:  Renal measurements: 7.5 x 3.6 x 4.3 cm = volume: 62 mL. Mild renal parenchyma atrophy. Normal echogenicity. No hydronephrosis or shadowing stone. Left Kidney: Renal measurements: 9.4 x 5.0 x 4.3 cm = volume: 10.5 mL. Mild parenchyma atrophy. Normal echogenicity. No hydronephrosis or shadowing stone. Bladder: Appears normal for degree of bladder distention. IMPRESSION: Mildly atrophic kidneys.  No hydronephrosis or shadowing stone. Electronically Signed   By: Anner Crete M.D.   On: 10/27/2018 22:35   Dg Chest Port 1 View  Result Date: 10/27/2018 CLINICAL DATA:  Weakness EXAM: PORTABLE CHEST 1 VIEW COMPARISON:   06/07/2018 FINDINGS: Hyperinflated lungs. Left-sided pacing device as before. No focal consolidation or effusion. Normal cardiomediastinal silhouette. No pneumothorax. IMPRESSION: No active disease. Electronically Signed   By: Donavan Foil M.D.   On: 10/27/2018 16:42      Subjective: No major events overnight of this morning.  Had some episode of perspiration early this morning that has resolved.  Denies chest pain, dyspnea, palpitation, GI or GU symptoms.  Feels much better and ready to go home.   Discharge Exam: Vitals:   10/29/18 1120 10/29/18 1122  BP: (!) 124/55 117/62  Pulse: (!) 59 (!) 59  Resp:    Temp:    SpO2: 100% 100%    GENERAL: No acute distress.  Appears well.  HEENT: MMM.  Vision and hearing grossly intact.  NECK: Supple.  No JVD.  LUNGS:  No IWOB. Good air movement bilaterally. HEART:  RRR. Heart sounds normal.  ABD: Bowel sounds present. Soft. Non tender.  MSK/EXT:  Moves all extremities. No apparent deformity. No edema bilaterally. SKIN: no apparent skin lesion or wound NEURO: Awake, alert and oriented appropriately.  No gross deficit.  PSYCH: Calm. Normal affect.     The results of significant diagnostics from this hospitalization (including imaging, microbiology, ancillary and laboratory) are listed below for reference.     Microbiology: Recent Results (from the past 240 hour(s))  Novel Coronavirus, NAA (hospital order; send-out to ref lab)     Status: None   Collection Time: 10/27/18  4:38 PM   Specimen: Nasopharyngeal Swab; Respiratory  Result Value Ref Range Status   SARS-CoV-2, NAA NOT DETECTED NOT DETECTED Final    Comment: (NOTE) This test was developed and its performance characteristics determined by [employer]. This test has not been FDA cleared or approved. This test has been authorized by FDA under an Emergency Use Authorization (EUA). This test is only authorized for the duration of time the declaration that circumstances  exist justifying the authorization of the emergency use of in vitro diagnostic tests for detection of SARS-CoV-2 virus and/or diagnosis of COVID-19 infection under section 564(b)(1) of the Act, 21 U.S.C. 568LEX-5(T)(7), unless the authorization is terminated or revoked sooner. When diagnostic testing is negative, the possibility of a false negative result should be considered in the context of a patient's recent exposures and the presence of clinical signs and symptoms consistent with COVID-19. An individual without symptoms of COVID-19 and who is not shedding SARS-CoV-2 virus would expect to have a negative (not detected) result in this assay. Performed  At: [facility] [address] [place], [place] [id number] Rush Farmer MD QP:[id number]    [place]  Final    Comment: Performed at [facility], [place] [address] [place], [place] [address]  Urine culture     Status: Abnormal   Collection Time: 10/27/18  6:57 PM   Specimen: Urine, Random  Result Value Ref Range  Status   Specimen Description URINE, RANDOM  Final   Special Requests NONE  Final   Culture (A)  Final    <10,000 COLONIES/mL INSIGNIFICANT GROWTH Performed at [facility], [place] [address] [place], [place] [address]    Report Status 10/28/2018 FINAL  Final     Labs: BNP (last 3 results) No results for input(s): BNP in the last 8760 hours. Basic Metabolic Panel: Recent Labs  Lab 10/27/18 1535 10/28/18 0630 10/29/18 0615  NA 129* 132* 138  K 3.3* 3.2* 4.3  CL 93* 99 108  CO2 17* 20* 20*  GLUCOSE 185* 139* 108*  BUN 90* 80* 57*  CREATININE 4.81* 2.96* 2.13*  CALCIUM 9.5 8.7* 8.4*  MG 2.8*  --  2.1   Liver Function Tests: Recent Labs  Lab 10/27/18 1535 10/28/18 0630  AST 31 26  ALT 13 13  ALKPHOS 106 74  BILITOT 1.3* 1.7*  PROT 8.1 6.3*  ALBUMIN 4.2 3.3*   No results for input(s): LIPASE, AMYLASE in the last 168 hours. No results for input(s): AMMONIA  in the last 168 hours. CBC: Recent Labs  Lab 10/27/18 1535 10/28/18 0630 10/29/18 0615  WBC 10.8* 8.9 7.7  NEUTROABS 8.0*  --   --   HGB 16.4 13.7 12.1*  HCT 46.3 38.1* 34.2*  MCV 87.9 87.4 88.8  PLT 275 239 207   Cardiac Enzymes: No results for input(s): CKTOTAL, CKMB, CKMBINDEX, TROPONINI in the last 168 hours. BNP: Invalid input(s): POCBNP CBG: No results for input(s): GLUCAP in the last 168 hours. D-Dimer No results for input(s): DDIMER in the last 72 hours. Hgb A1c No results for input(s): HGBA1C in the last 72 hours. Lipid Profile No results for input(s): CHOL, HDL, LDLCALC, TRIG, CHOLHDL, LDLDIRECT in the last 72 hours. Thyroid function studies Recent Labs    10/27/18 1535  TSH 0.361   Anemia work up No results for input(s): VITAMINB12, FOLATE, FERRITIN, TIBC, IRON, RETICCTPCT in the last 72 hours. Urinalysis    Component Value Date/Time   COLORURINE AMBER (A) 10/27/2018 1745   APPEARANCEUR TURBID (A) 10/27/2018 1745   LABSPEC 1.018 10/27/2018 1745   PHURINE 6.0 10/27/2018 1745   GLUCOSEU NEGATIVE 10/27/2018 1745   HGBUR MODERATE (A) 10/27/2018 1745   BILIRUBINUR NEGATIVE 10/27/2018 1745   KETONESUR NEGATIVE 10/27/2018 1745   PROTEINUR 100 (A) 10/27/2018 1745   UROBILINOGEN 1.0 04/10/2013 0706   NITRITE NEGATIVE 10/27/2018 1745   LEUKOCYTESUR NEGATIVE 10/27/2018 1745   Sepsis Labs Invalid input(s): PROCALCITONIN,  WBC,  LACTICIDVEN   Time coordinating discharge: 35 minutes  SIGNED:  Mercy Riding, MD  [facility] 10/29/2018, 3:31 PM  If 7PM-7AM, please contact night-coverage [email or web address] Password [email or web address]

## 2018-10-29 NOTE — Progress Notes (Signed)
Patient's chemotherapy meds given at bedside prior to d/c

## 2018-10-29 NOTE — Progress Notes (Addendum)
Nutrition Follow-up  DOCUMENTATION CODES:   Not applicable  INTERVENTION:   -Continue MVI with minerals daily -D/c Prostat -Continue Boost Breeze po BID, each supplement provides 250 kcal and 9 grams of protein  NUTRITION DIAGNOSIS:   Increased nutrient needs related to chronic illness(prostate cancer) as evidenced by estimated needs.  Ongoing  GOAL:   Patient will meet greater than or equal to 90% of their needs  Progressing   MONITOR:   PO intake, Supplement acceptance, Labs, Weight trends, Skin, I & O's  REASON FOR ASSESSMENT:   Malnutrition Screening Tool    ASSESSMENT:   The patient is an 83 yr old man who carries a past medical history significant for prostate cancer, atrial flutter, Colostomy, dyslipidemia, orthostatic hypotension, sick sinus syndrome/s/p St. Jude pacemaker, and thyroid disease.The patient presented to [facility] with complaints of not being able to eat for about a week due to lack of appetite. He has been having to get up in the middle of the night to urinate. He states that he gets a flushed feeling and then he has to rush to go urinate. He states that many times when he gets to the bathroom he feels like he is going to pass out. When he feels like this, he lies down so he isn't injured of falls. He states that his urine when he starts to pee is yellow, but then turns bloody. He has been having generalized weakness and fatigue. He denies fevers, chill, nausea, vomiting, diarrhea, or constipation. He does not get short of breath, chest pain, or palpitations.  Reviewed I/O's: -186 ml x 24 hours and +667 ml since admission  UOP: 1.4 L x 24 hours  Spoke with pt at bedside, who reports good appetite presently, about to eat lunch tray. He shares that he consumes all of his breakfast of grits, eggs, and bacon. PTA, he reports not eating for 5 days due to not feeling well, but at baseline, has "a regular normal appetite", which consists of 3 meals per day  (Breakfast: eggs, toast, and bacon; lunch: roast beef sandwich or tuna salad with crackers; dinner: meat, starch, and vegetable). He denies any difficulty chewing or swallowing foods.   Pt denies any weight loss. He reports his UBW is around 150#, but fluctuates anywhere between 150-160#. Per wt records, pt has experienced a 4.8% wt loss over the past 3 months, which is not significant for time frame.   Discussed importance of good meal and supplement intake to promote healing.   Labs reviewed: Na: 132, K: 3.2.   NUTRITION - FOCUSED PHYSICAL EXAM:    Most Recent Value  Orbital Region  No depletion  Upper Arm Region  No depletion  Thoracic and Lumbar Region  No depletion  Buccal Region  No depletion  Temple Region  No depletion  Clavicle Bone Region  No depletion  Clavicle and Acromion Bone Region  No depletion  Scapular Bone Region  No depletion  Dorsal Hand  No depletion  Patellar Region  No depletion  Anterior Thigh Region  No depletion  Posterior Calf Region  No depletion  Edema (RD Assessment)  None  Hair  Reviewed  Eyes  Reviewed  Mouth  Reviewed  Skin  Reviewed  Nails  Reviewed       Diet Order:   Diet Order            DIET DYS 3 Room service appropriate? Yes; Fluid consistency: Thin  Diet effective now  EDUCATION NEEDS:   No education needs have been identified at this time  Skin:  Skin Assessment: Reviewed RN Assessment  Last BM:  Unknown  Height:   Ht Readings from Last 1 Encounters:  10/27/18 5\' 7"  (1.702 m)    Weight:   Wt Readings from Last 1 Encounters:  10/29/18 73.1 kg    Ideal Body Weight:  67.3 kg  BMI:  Body mass index is 25.23 kg/m.  Estimated Nutritional Needs:   Kcal:  1700-1900  Protein:  85-100 grams  Fluid:  1.7-1.9 L    Eben Choinski A. Jimmye Norman, RD, LDN, [place] Registered Dietitian II Certified Diabetes Care and Education Specialist Pager: [phone] After hours Pager: [phone]

## 2018-10-29 NOTE — Evaluation (Signed)
Occupational Therapy Evaluation and Discharge Patient Details Name: [name] [name] MRN: [id number] DOB: 12-30-1935 Today's Date: 10/29/2018    History of Present Illness 83 yr old man admitted with near syncope,hematuria, SVT, FTT. PMHx: colostomy, prostate cancer, atrial flutter, Colostomy, dyslipidemia, orthostatic hypotension, sick sinus syndrome/s/p St. Jude pacemaker, and thyroid disease.   Clinical Impression   Supervised pt for ADL due to catheter bag and IV pole, but without pt likely to function independently in ADL. He denied any dizziness with mobility to bathroom and sink. Able to retrieve items on floor in standing without LOB and demonstrate simulated tub transfer. No OT needs.    Follow Up Recommendations  No OT follow up    Equipment Recommendations  None recommended by OT    Recommendations for Other Services       Precautions / Restrictions Precautions Precautions: Fall Precaution Comments: condom cath Restrictions Weight Bearing Restrictions: No      Mobility Bed Mobility Overal bed mobility: Independent                Transfers Overall transfer level: Independent Equipment used: None             General transfer comment: from bed and toilet    Balance Overall balance assessment: No apparent balance deficits (not formally assessed)                                         ADL either performed or assessed with clinical judgement   ADL                                         General ADL Comments: supervised to manage IV line and catheter bag     Vision Patient Visual Report: No change from baseline       Perception     Praxis      Pertinent Vitals/Pain Pain Assessment: No/denies pain     Hand Dominance Right   Extremity/Trunk Assessment Upper Extremity Assessment Upper Extremity Assessment: Overall WFL for tasks assessed   Lower Extremity Assessment Lower Extremity Assessment: Defer to  PT evaluation   Cervical / Trunk Assessment Cervical / Trunk Assessment: Normal   Communication Communication Communication: No difficulties   Cognition Arousal/Alertness: Awake/alert Behavior During Therapy: WFL for tasks assessed/performed Overall Cognitive Status: Within Functional Limits for tasks assessed                                     General Comments       Exercises     Shoulder Instructions      Home Living Family/patient expects to be discharged to:: Private residence Living Arrangements: Alone Available Help at Discharge: Family;Available PRN/intermittently Type of Home: Apartment Home Access: Level entry     Home Layout: One level     Bathroom Shower/Tub: Teacher, early years/pre: Standard     Home Equipment: None   Additional Comments: does not drive, goes out to grocery store and dr only      Prior Functioning/Environment Level of Independence: Independent                 OT Problem List:        OT Treatment/Interventions:  OT Goals(Current goals can be found in the care plan section) Acute Rehab OT Goals Patient Stated Goal: return home  OT Frequency:     Barriers to D/C:            Co-evaluation              AM-PAC OT "6 Clicks" Daily Activity     Outcome Measure Help from another person eating meals?: None Help from another person taking care of personal grooming?: None Help from another person toileting, which includes using toliet, bedpan, or urinal?: None Help from another person bathing (including washing, rinsing, drying)?: None Help from another person to put on and taking off regular upper body clothing?: None Help from another person to put on and taking off regular lower body clothing?: None 6 Click Score: 24   End of Session    Activity Tolerance: Patient tolerated treatment well Patient left: in bed;with call [place]/phone within reach;with bed alarm set  OT Visit  Diagnosis: History of falling (Z91.81)                Time: 5449-2010 OT Time Calculation (min): 20 min Charges:  OT General Charges $OT Visit: 1 Visit OT Evaluation $OT Eval Moderate Complexity: 1 Mod  Malka So 10/29/2018, 10:28 AM  Nestor Lewandowsky, OTR/L Acute Rehabilitation Services Pager: [phone] Office: [phone]

## 2018-10-29 NOTE — Progress Notes (Signed)
Physical Therapy Treatment/ Discharge Patient Details Name: [name] [name] MRN: [id number] DOB: 1935-10-14 Today's Date: 10/29/2018    History of Present Illness 83 yr old man admitted with near syncope,hematuria, SVT, FTT. PMHx: colostomy, prostate cancer, atrial flutter, Colostomy, dyslipidemia, orthostatic hypotension, sick sinus syndrome/s/p St. Jude pacemaker, and thyroid disease.    PT Comments    PT very pleasant today and reports feeling much better. Pt alert, talkative and much more interactive and mobile than prior date. Pt able to perform all mobility and gait without assist without further need for therapy at this time. Pt reports currently at baseline without further need for therapy intervention and assist of family at home as needed for transportation and function. Will sign off.     Follow Up Recommendations  No PT follow up     Equipment Recommendations  None recommended by PT    Recommendations for Other Services       Precautions / Restrictions Precautions Precautions: Fall Precaution Comments: condom cath Restrictions Weight Bearing Restrictions: No    Mobility  Bed Mobility Overal bed mobility: Independent                Transfers Overall transfer level: Independent Equipment used: None             General transfer comment: from bed and toilet  Ambulation/Gait Ambulation/Gait assistance: Independent Gait Distance (Feet): 650 Feet Assistive device: None Gait Pattern/deviations: WFL(Within Functional Limits)   Gait velocity interpretation: >4.37 ft/sec, indicative of normal walking speed General Gait Details: pt able to walk long hall distance without assist with good speed   Stairs             Wheelchair Mobility    Modified Rankin (Stroke Patients Only)       Balance Overall balance assessment: No apparent balance deficits (not formally assessed)                                          Cognition  Arousal/Alertness: Awake/alert Behavior During Therapy: WFL for tasks assessed/performed Overall Cognitive Status: Within Functional Limits for tasks assessed                                        Exercises      General Comments        Pertinent Vitals/Pain Pain Assessment: No/denies pain    Home Living Family/patient expects to be discharged to:: Private residence Living Arrangements: Alone Available Help at Discharge: Family;Available PRN/intermittently Type of Home: Apartment Home Access: Level entry   Home Layout: One level Home Equipment: None Additional Comments: does not drive, goes out to grocery store and dr only    Prior Function Level of Independence: Independent          PT Goals (current goals can now be found in the care plan section) Acute Rehab PT Goals Patient Stated Goal: return home Progress towards PT goals: Goals met/education completed, patient discharged from PT    Frequency           PT Plan Discharge plan needs to be updated    Co-evaluation              AM-PAC PT "6 Clicks" Mobility   Outcome Measure  Help needed turning from your back to your side while in  a flat bed without using bedrails?: None Help needed moving from lying on your back to sitting on the side of a flat bed without using bedrails?: None Help needed moving to and from a bed to a chair (including a wheelchair)?: None Help needed standing up from a chair using your arms (e.g., wheelchair or bedside chair)?: None Help needed to walk in hospital room?: None Help needed climbing 3-5 steps with a railing? : None 6 Click Score: 24    End of Session Equipment Utilized During Treatment: Gait belt Activity Tolerance: Patient tolerated treatment well Patient left: in bed;with call [place]/phone within reach Nurse Communication: Mobility status PT Visit Diagnosis: Other abnormalities of gait and mobility (R26.89)     Time: 1030-1046 PT Time  Calculation (min) (ACUTE ONLY): 16 min  Charges:  $Gait Training: 8-22 mins                     [place] Pager: [phone] Office: [place] 10/29/2018, 12:52 PM

## 2018-11-02 DIAGNOSIS — Z45018 Encounter for adjustment and management of other part of cardiac pacemaker: Secondary | ICD-10-CM

## 2018-11-02 DIAGNOSIS — I480 Paroxysmal atrial fibrillation: Secondary | ICD-10-CM

## 2018-11-02 DIAGNOSIS — Z950 Presence of cardiac pacemaker: Secondary | ICD-10-CM | POA: Diagnosis not present

## 2018-11-04 ENCOUNTER — Telehealth: Payer: Self-pay

## 2018-11-04 NOTE — Telephone Encounter (Signed)
  Location of hospitalization: [place] Reason for hospitalization: syncope and collapse   Date of discharge: 10/29/2018 Date of first communication with patient: today Person contacting patient: Cheri Kearns  Current symptoms: None (pt feels better) Do you understand why you were in the Hospital: Yes Questions regarding discharge instructions: None Where were you discharged to: Home Medications reviewed: Yes Allergies reviewed: Yes Dietary changes reviewed: Yes. Discussed low fat and low salt diet.  Referals reviewed: NA Activities of Daily Living: Able to with mild limitations Any transportation issues/concerns: None Any patient concerns: None Confirmed importance & date/time of Follow up appt: Yes Confirmed with patient if condition begins to worsen call. Pt was given the office number and encouraged to call back with questions or concerns: Yes

## 2018-11-17 ENCOUNTER — Encounter: Payer: Self-pay | Admitting: Cardiology

## 2018-11-17 ENCOUNTER — Ambulatory Visit (INDEPENDENT_AMBULATORY_CARE_PROVIDER_SITE_OTHER): Payer: Medicare Other | Admitting: Cardiology

## 2018-11-17 ENCOUNTER — Other Ambulatory Visit: Payer: Self-pay

## 2018-11-17 VITALS — BP 136/80 | HR 64 | Ht 67.0 in | Wt 162.2 lb

## 2018-11-17 DIAGNOSIS — I495 Sick sinus syndrome: Secondary | ICD-10-CM

## 2018-11-17 DIAGNOSIS — Z950 Presence of cardiac pacemaker: Secondary | ICD-10-CM | POA: Diagnosis not present

## 2018-11-17 DIAGNOSIS — I10 Essential (primary) hypertension: Secondary | ICD-10-CM

## 2018-11-17 DIAGNOSIS — I4892 Unspecified atrial flutter: Secondary | ICD-10-CM | POA: Diagnosis not present

## 2018-11-17 NOTE — Progress Notes (Signed)
Primary Physician/Referring:  [name], MD  Patient ID: [name] [name], male    DOB: [birth year], 83 y.o.   MRN: [id number]  Chief Complaint  Patient presents with  . Hypertension  . Atrial Fibrillation   HPI:    HPI: [name] [name]  is a 83 y.o. AAM with history of paroxysmal atrial flutter status post ablation but has had recurrent atrial flutter, sick sinus syndrome and presently has a pacemaker implantation since 06/16/2011 and is pacer dependant. He had paroxysmal episodes of sustained atrial fibrillation, first episode on 07/18/14 and placed on Flecainide and anticoagulation.   Patient was admitted to the hospital on 10/27/2018 and discharged 2 days later when he presented with acute kidney injury secondary to severe hypotension and dehydration.  Blood pressure medications were discontinued except continued metoprolol due to brief episodes of atrial flutter with RVR. He had stopped Flecainide a month ago.   He now presents for office visit, posthospital discharge follow-up.  States that he is now feeling well, fatigue has improved, no further dizziness or hematurea.  He is already seen his urologist for his hematuria and is presently taking chemotherapy for prostate cancer and is also free had visit with his PCP as well and labs are done which are pending results.   He was admitted similarly with dehydration in March 2020 and all his blood pressure medications were discontinued.   Past Medical History:  Diagnosis Date  . AKI (acute kidney injury) ([place]) 06/08/2018  . Atrial flutter ([place])   . Carcinoma of prostate ([place]) 12/30/2011   Treated with seed/radiation therapy.   . Colostomy in [facility])   . Difficulty sleeping    lives in shelter currently  . Diverticulitis   . Dyslipidemia 12/30/2011  . Encounter for care of pacemaker 06/17/2018  . Frequency of urination   . Heart murmur   . Hypertension 04/30/11   Cardioversion 06/16/11  . Near syncope 06/07/2018  . Nocturia   .  Orthostatic hypotension 06/08/2018  . Pacemaker 7/13    sick sinus syndrome/St Jude pacemaker  . Paroxysmal atrial flutter ([place]) 06/16/2011  . Prostate cancer ([place])   . Syncope and collapse 11/20/2011   Atrial and ventricular standstill > 3 seconds.   . Thyroid disease    had "overactive thyroid in 1997" - no known problem since   Past Surgical History:  Procedure Laterality Date  . ATRIAL FLUTTER ABLATION N/A 11/21/2011   Procedure: ATRIAL FLUTTER ABLATION;  Surgeon: Thompson Grayer, MD;  Location: [facility] CATH LAB;  Service: Cardiovascular;  Laterality: N/A;  . CARDIAC ELECTROPHYSIOLOGY STUDY AND ABLATION  7/13  . CARDIOVERSION  06/16/2011   Procedure: CARDIOVERSION;  Surgeon: Laverda Page, MD;  Location: [place];  Service: Cardiovascular;  Laterality: N/A;  . COLON SURGERY  05/13/2013  . COLOSTOMY CLOSURE N/A 11/25/2013   Procedure: COLOSTOMY CLOSURE;  Surgeon: Earnstine Regal, MD;  Location: [facility] ORS;  Service: General;  Laterality: N/A;  . COLOSTOMY CLOSURE  11/25/2013  . PACEMAKER INSERTION  11/28/11   SJM Accent DR RF implanted by Dr Rayann Heman  . PARTIAL COLECTOMY N/A 05/13/2013   Procedure: sigmoid COLECTOMY  colostomy ;  Surgeon: Earnstine Regal, MD;  Location: [facility] ORS;  Service: General;  Laterality: N/A;  . PERMANENT PACEMAKER INSERTION N/A 11/28/2011   Procedure: PERMANENT PACEMAKER INSERTION;  Surgeon: Thompson Grayer, MD;  Location: [facility] CATH LAB;  Service: Cardiovascular;  Laterality: N/A;  . RADIOACTIVE SEED IMPLANT    . TONSILLECTOMY     Social  History   Socioeconomic History  . Marital status: Single    Spouse name: Not on file  . Number of children: 6  . Years of education: Not on file  . Highest education level: Not on file  Occupational History  . Not on file  Social Needs  . Financial resource strain: Not on file  . Food insecurity    Worry: Not on file    Inability: Not on file  . Transportation needs    Medical: Not on file    Non-medical: Not on file  Tobacco Use  . Smoking  status: Former Smoker    Packs/day: 0.50    Years: 50.00    Pack years: 25.00    Types: Cigarettes    Quit date: 07/20/2009    Years since quitting: 9.3  . Smokeless tobacco: Never Used  Substance and Sexual Activity  . Alcohol use: No  . Drug use: No  . Sexual activity: Never  Lifestyle  . Physical activity    Days per week: Not on file    Minutes per session: Not on file  . Stress: Not on file  Relationships  . Social Herbalist on phone: Not on file    Gets together: Not on file    Attends religious service: Not on file    Active member of club or organization: Not on file    Attends meetings of clubs or organizations: Not on file    Relationship status: Not on file  . Intimate partner violence    Fear of current or ex partner: Not on file    Emotionally abused: Not on file    Physically abused: Not on file    Forced sexual activity: Not on file  Other Topics Concern  . Not on file  Social History Narrative  . Not on file   ROS  Review of Systems  Constitution: Negative for chills, decreased appetite, malaise/fatigue and weight gain.  Cardiovascular: Negative for dyspnea on exertion, leg swelling and syncope.  Endocrine: Negative for cold intolerance.  Hematologic/Lymphatic: Does not bruise/bleed easily.  Musculoskeletal: Negative for joint swelling.  Gastrointestinal: Negative for abdominal pain, anorexia, change in bowel habit, hematochezia and melena.  Neurological: Negative for headaches and light-headedness.  Psychiatric/Behavioral: Negative for depression and substance abuse.  All other systems reviewed and are negative.  Objective  Blood pressure 136/80, pulse 64, height 5\' 7"  (1.702 m), weight 162 lb 3.2 oz (73.6 kg), SpO2 97 %. Body mass index is 25.4 kg/m.   Physical Exam  Constitutional: He appears well-developed and well-nourished. No distress.  HENT:  Head: Atraumatic.  Eyes: Conjunctivae are normal.  Neck: Neck supple. No JVD present.  No thyromegaly present.  Cardiovascular: Normal rate, regular rhythm, normal heart sounds and intact distal pulses. Exam reveals no gallop.  No murmur heard. Pulses:      Carotid pulses are 2+ on the right side and 2+ on the left side.      Radial pulses are 2+ on the right side and 2+ on the left side.       Femoral pulses are 2+ on the right side and 2+ on the left side.      Popliteal pulses are 2+ on the right side and 2+ on the left side.       Dorsalis pedis pulses are 1+ on the right side and 1+ on the left side.       Posterior tibial pulses are 1+ on the right  side and 1+ on the left side.  Pulmonary/Chest: Effort normal and breath sounds normal.  Pacemaker/ICD site noted  in the left infraclavicular fossa.    Abdominal: Soft. Bowel sounds are normal.  Musculoskeletal: Normal range of motion.        General: No edema.  Neurological: He is alert.  Skin: Skin is warm and dry.  Psychiatric: He has a normal mood and affect.   Radiology: No results found.  Laboratory examination:   CMP Latest Ref Rng & Units 10/29/2018 10/28/2018 10/27/2018  Glucose 70 - 99 mg/dL 108(H) 139(H) 185(H)  BUN 8 - 23 mg/dL 57(H) 80(H) 90(H)  Creatinine 0.61 - 1.24 mg/dL 2.13(H) 2.96(H) 4.81(H)  Sodium 135 - 145 mmol/L 138 132(L) 129(L)  Potassium 3.5 - 5.1 mmol/L 4.3 3.2(L) 3.3(L)  Chloride 98 - 111 mmol/L 108 99 93(L)  CO2 22 - 32 mmol/L 20(L) 20(L) 17(L)  Calcium 8.9 - 10.3 mg/dL 8.4(L) 8.7(L) 9.5  Total Protein 6.5 - 8.1 g/dL - 6.3(L) 8.1  Total Bilirubin 0.3 - 1.2 mg/dL - 1.7(H) 1.3(H)  Alkaline Phos 38 - 126 U/L - 74 106  AST 15 - 41 U/L - 26 31  ALT 0 - 44 U/L - 13 13   CBC Latest Ref Rng & Units 10/29/2018 10/28/2018 10/27/2018  WBC 4.0 - 10.5 K/uL 7.7 8.9 10.8(H)  Hemoglobin 13.0 - 17.0 g/dL 12.1(L) 13.7 16.4  Hematocrit 39.0 - 52.0 % 34.2(L) 38.1(L) 46.3  Platelets 150 - 400 K/uL 207 239 275   Lipid Panel  No results found for: CHOL, TRIG, HDL, CHOLHDL, VLDL, LDLCALC, LDLDIRECT  HEMOGLOBIN A1C Lab Results  Component Value Date   HGBA1C 6.2 (H) 11/21/2011   MPG 131 (H) 11/21/2011   TSH Recent Labs    06/09/18 0216 10/27/18 1535  TSH 0.884 0.361   Medications   Current Outpatient Medications  Medication Instructions  . apixaban (ELIQUIS) 2.5 mg, Oral, 2 times daily  . atorvastatin (LIPITOR) 20 mg, Oral, Daily  . metoprolol tartrate (LOPRESSOR) 25 mg, Oral, 2 times daily  . Multiple Vitamin (MULTIVITAMIN WITH MINERALS) TABS tablet 1 tablet, Oral, Daily  . tamsulosin (FLOMAX) 0.4 mg, Oral, Daily at bedtime  . Xtandi 80 mg, Oral, 2 times daily, Take 80 mg by mouth two times a day- 5 PM and 9 PM   Cardiac Studies:   Echocardiogram 06/08/2018:  1. The left ventricle has hyperdynamic systolic function of >14%. The cavity size is normal. There is no increas ed left ventricular wall thickness. Echo evidence of normal diastolic filling patterns. Normal left ventricular filling pressures.  2. The aortic valve is tricuspid in structure. There is mild thickening and mild calcification of the aortic valve.   Assessment     ICD-10-CM   1. Paroxysmal atrial flutter (HCC)  I48.92    CHA2DS2-VASc Score is 3.0.  2. Sick sinus syndrome (HCC)  I49.5   3. Pacemaker [place].1  Z95.0   4. Essential hypertension, benign  I10     EKG: 10/27/2018: Atrial flutter with 2: 1 conduction with rapid ventricular response at the rate of 150 bpm.  No ST-T wave changes of ischemia.  Remote pacemaker check  6.28.20: 2353 atrial high rate episodes detected. The longest lasted 0:07:21:08 in duration. There was a 5% cumulative atrial arrhythmia burden. EGMs show AFL. Health trends do not demonstrate significant abnormality. Battery longevity is 4.0 years . RA pacing is 94.0 %, RV pacing is 5.5 %.  In person pacemaker interrogation  10/27/2018:  Paroxysmal episodes of non-sustained atrial tachycardia/atrial flutter, normal threshold and impedance of the leads, normal  function.  Recommendations:   Since his diagnosis of prostate cancer being on chemotherapy, he has had episodes of heart pressures, episodes of severe nausea, dehydration and syncope or near syncope latest admission on 10/27/2018.  All his medications for hypertension have been discontinued except we'll continue metoprolol and his blood pressure is well maintained.  He is tolerating anticoagulation without bleeding diathesis, renal function is improved, continue present medical therapy.  He has had labs done recently after his discharge with his PCP which I do not have the results yet.  I'd like to see him back in 3 months for follow-up.  His pacemaker is functioning normally.  Continue anticoagulation, 0 to has been changed to Eliquis 2.5 mg b.i.d. in view of renal dysfunction and his age.  Adrian Prows, MD, [facility] 11/25/2018, 6:29 AM [facility] [place] Pager: [phone] Office: [phone] If no answer Cell [phone]

## 2019-01-01 ENCOUNTER — Other Ambulatory Visit: Payer: Self-pay | Admitting: Cardiology

## 2019-01-10 ENCOUNTER — Other Ambulatory Visit: Payer: Self-pay

## 2019-01-10 ENCOUNTER — Emergency Department (HOSPITAL_COMMUNITY): Payer: Medicare Other

## 2019-01-10 ENCOUNTER — Encounter (HOSPITAL_COMMUNITY): Payer: Self-pay

## 2019-01-10 ENCOUNTER — Inpatient Hospital Stay (HOSPITAL_COMMUNITY)
Admission: EM | Admit: 2019-01-10 | Discharge: 2019-01-14 | DRG: 641 | Disposition: A | Discharge status: Home Health | Payer: Medicare Other | Attending: Internal Medicine | Admitting: Internal Medicine

## 2019-01-10 DIAGNOSIS — E871 Hypo-osmolality and hyponatremia: Secondary | ICD-10-CM

## 2019-01-10 DIAGNOSIS — Z9221 Personal history of antineoplastic chemotherapy: Secondary | ICD-10-CM

## 2019-01-10 DIAGNOSIS — N179 Acute kidney failure, unspecified: Secondary | ICD-10-CM | POA: Diagnosis present

## 2019-01-10 DIAGNOSIS — Z950 Presence of cardiac pacemaker: Secondary | ICD-10-CM

## 2019-01-10 DIAGNOSIS — I495 Sick sinus syndrome: Secondary | ICD-10-CM | POA: Diagnosis present

## 2019-01-10 DIAGNOSIS — E785 Hyperlipidemia, unspecified: Secondary | ICD-10-CM | POA: Diagnosis present

## 2019-01-10 DIAGNOSIS — N390 Urinary tract infection, site not specified: Secondary | ICD-10-CM | POA: Diagnosis not present

## 2019-01-10 DIAGNOSIS — R310 Gross hematuria: Secondary | ICD-10-CM | POA: Diagnosis present

## 2019-01-10 DIAGNOSIS — Z79899 Other long term (current) drug therapy: Secondary | ICD-10-CM

## 2019-01-10 DIAGNOSIS — Z888 Allergy status to other drugs, medicaments and biological substances status: Secondary | ICD-10-CM

## 2019-01-10 DIAGNOSIS — Z87891 Personal history of nicotine dependence: Secondary | ICD-10-CM

## 2019-01-10 DIAGNOSIS — I129 Hypertensive chronic kidney disease with stage 1 through stage 4 chronic kidney disease, or unspecified chronic kidney disease: Secondary | ICD-10-CM | POA: Diagnosis present

## 2019-01-10 DIAGNOSIS — E876 Hypokalemia: Secondary | ICD-10-CM | POA: Diagnosis present

## 2019-01-10 DIAGNOSIS — I480 Paroxysmal atrial fibrillation: Secondary | ICD-10-CM | POA: Diagnosis present

## 2019-01-10 DIAGNOSIS — B9620 Unspecified Escherichia coli [E. coli] as the cause of diseases classified elsewhere: Secondary | ICD-10-CM | POA: Diagnosis not present

## 2019-01-10 DIAGNOSIS — C61 Malignant neoplasm of prostate: Secondary | ICD-10-CM | POA: Diagnosis present

## 2019-01-10 DIAGNOSIS — Z923 Personal history of irradiation: Secondary | ICD-10-CM

## 2019-01-10 DIAGNOSIS — Z590 Homelessness: Secondary | ICD-10-CM

## 2019-01-10 DIAGNOSIS — Z7901 Long term (current) use of anticoagulants: Secondary | ICD-10-CM

## 2019-01-10 DIAGNOSIS — I951 Orthostatic hypotension: Secondary | ICD-10-CM | POA: Diagnosis present

## 2019-01-10 DIAGNOSIS — N183 Chronic kidney disease, stage 3 (moderate): Secondary | ICD-10-CM | POA: Diagnosis present

## 2019-01-10 DIAGNOSIS — E860 Dehydration: Secondary | ICD-10-CM | POA: Diagnosis not present

## 2019-01-10 DIAGNOSIS — E861 Hypovolemia: Secondary | ICD-10-CM | POA: Diagnosis present

## 2019-01-10 DIAGNOSIS — Z20828 Contact with and (suspected) exposure to other viral communicable diseases: Secondary | ICD-10-CM | POA: Diagnosis present

## 2019-01-10 DIAGNOSIS — I4892 Unspecified atrial flutter: Secondary | ICD-10-CM | POA: Diagnosis present

## 2019-01-10 DIAGNOSIS — N401 Enlarged prostate with lower urinary tract symptoms: Secondary | ICD-10-CM | POA: Diagnosis present

## 2019-01-10 DIAGNOSIS — Z9049 Acquired absence of other specified parts of digestive tract: Secondary | ICD-10-CM

## 2019-01-10 DIAGNOSIS — R55 Syncope and collapse: Secondary | ICD-10-CM

## 2019-01-10 NOTE — ED Triage Notes (Signed)
Pt presents from home for generalized weakness. He is currently receiving treatment for prostate cancer. Reports poor oral intake and multiple episodes of syncope this week. Normally walks at home, but has been unable to for the last few days.   AV sequential pacer, 12 lead unremarkable with EMS.  CBG 159

## 2019-01-11 ENCOUNTER — Observation Stay (HOSPITAL_COMMUNITY): Payer: Medicare Other

## 2019-01-11 ENCOUNTER — Encounter (HOSPITAL_COMMUNITY): Payer: Self-pay | Admitting: Emergency Medicine

## 2019-01-11 DIAGNOSIS — R55 Syncope and collapse: Secondary | ICD-10-CM | POA: Diagnosis not present

## 2019-01-11 DIAGNOSIS — N183 Chronic kidney disease, stage 3 unspecified: Secondary | ICD-10-CM | POA: Diagnosis present

## 2019-01-11 DIAGNOSIS — N401 Enlarged prostate with lower urinary tract symptoms: Secondary | ICD-10-CM | POA: Diagnosis present

## 2019-01-11 DIAGNOSIS — N390 Urinary tract infection, site not specified: Secondary | ICD-10-CM | POA: Diagnosis not present

## 2019-01-11 DIAGNOSIS — Z9049 Acquired absence of other specified parts of digestive tract: Secondary | ICD-10-CM | POA: Diagnosis not present

## 2019-01-11 DIAGNOSIS — N179 Acute kidney failure, unspecified: Secondary | ICD-10-CM

## 2019-01-11 DIAGNOSIS — Z923 Personal history of irradiation: Secondary | ICD-10-CM | POA: Diagnosis not present

## 2019-01-11 DIAGNOSIS — I495 Sick sinus syndrome: Secondary | ICD-10-CM | POA: Diagnosis present

## 2019-01-11 DIAGNOSIS — E860 Dehydration: Secondary | ICD-10-CM | POA: Diagnosis present

## 2019-01-11 DIAGNOSIS — I4892 Unspecified atrial flutter: Secondary | ICD-10-CM | POA: Diagnosis not present

## 2019-01-11 DIAGNOSIS — E876 Hypokalemia: Secondary | ICD-10-CM | POA: Diagnosis present

## 2019-01-11 DIAGNOSIS — Z45018 Encounter for adjustment and management of other part of cardiac pacemaker: Secondary | ICD-10-CM | POA: Diagnosis not present

## 2019-01-11 DIAGNOSIS — B9620 Unspecified Escherichia coli [E. coli] as the cause of diseases classified elsewhere: Secondary | ICD-10-CM | POA: Diagnosis not present

## 2019-01-11 DIAGNOSIS — E785 Hyperlipidemia, unspecified: Secondary | ICD-10-CM | POA: Diagnosis present

## 2019-01-11 DIAGNOSIS — Z20828 Contact with and (suspected) exposure to other viral communicable diseases: Secondary | ICD-10-CM | POA: Diagnosis present

## 2019-01-11 DIAGNOSIS — Z79899 Other long term (current) drug therapy: Secondary | ICD-10-CM | POA: Diagnosis not present

## 2019-01-11 DIAGNOSIS — E871 Hypo-osmolality and hyponatremia: Secondary | ICD-10-CM | POA: Diagnosis present

## 2019-01-11 DIAGNOSIS — Z87891 Personal history of nicotine dependence: Secondary | ICD-10-CM | POA: Diagnosis not present

## 2019-01-11 DIAGNOSIS — C61 Malignant neoplasm of prostate: Secondary | ICD-10-CM

## 2019-01-11 DIAGNOSIS — Z950 Presence of cardiac pacemaker: Secondary | ICD-10-CM | POA: Diagnosis not present

## 2019-01-11 DIAGNOSIS — I951 Orthostatic hypotension: Secondary | ICD-10-CM | POA: Diagnosis present

## 2019-01-11 DIAGNOSIS — E861 Hypovolemia: Secondary | ICD-10-CM | POA: Diagnosis present

## 2019-01-11 DIAGNOSIS — I129 Hypertensive chronic kidney disease with stage 1 through stage 4 chronic kidney disease, or unspecified chronic kidney disease: Secondary | ICD-10-CM | POA: Diagnosis present

## 2019-01-11 DIAGNOSIS — Z7901 Long term (current) use of anticoagulants: Secondary | ICD-10-CM | POA: Diagnosis not present

## 2019-01-11 DIAGNOSIS — R310 Gross hematuria: Secondary | ICD-10-CM | POA: Diagnosis present

## 2019-01-11 DIAGNOSIS — Z888 Allergy status to other drugs, medicaments and biological substances status: Secondary | ICD-10-CM | POA: Diagnosis not present

## 2019-01-11 DIAGNOSIS — I480 Paroxysmal atrial fibrillation: Secondary | ICD-10-CM | POA: Diagnosis present

## 2019-01-11 LAB — URINALYSIS, ROUTINE W REFLEX MICROSCOPIC
RBC / HPF: >50 RBC/hpf — ABNORMAL HIGH (ref 0–5)
WBC, UA: >50 WBC/hpf — ABNORMAL HIGH (ref 0–5)

## 2019-01-11 LAB — I-STAT CHEM 8, ED
BUN: 101 mg/dL — ABNORMAL HIGH (ref 8–23)
Calcium, Ion: 1.03 mmol/L — ABNORMAL LOW (ref 1.15–1.40)
Chloride: 94 mmol/L — ABNORMAL LOW (ref 98–111)
Creatinine, Ser: 4.7 mg/dL — ABNORMAL HIGH (ref 0.61–1.24)
Glucose, Bld: 138 mg/dL — ABNORMAL HIGH (ref 70–99)
HCT: 47 % (ref 39.0–52.0)
Hemoglobin: 16 g/dL (ref 13.0–17.0)
Potassium: 3.1 mmol/L — ABNORMAL LOW (ref 3.5–5.1)
Sodium: 127 mmol/L — ABNORMAL LOW (ref 135–145)
TCO2: 23 mmol/L (ref 22–32)

## 2019-01-11 LAB — CBC WITH DIFFERENTIAL/PLATELET
Abs Immature Granulocytes: 0.06 10*3/uL (ref 0.00–0.07)
Abs Immature Granulocytes: 0.08 10*3/uL — ABNORMAL HIGH (ref 0.00–0.07)
Basophils Absolute: 0 10*3/uL (ref 0.0–0.1)
Basophils Absolute: 0 10*3/uL (ref 0.0–0.1)
Basophils Relative: 0 %
Basophils Relative: 0 %
Eosinophils Absolute: 0 10*3/uL (ref 0.0–0.5)
Eosinophils Absolute: 0 10*3/uL (ref 0.0–0.5)
Eosinophils Relative: 0 %
Eosinophils Relative: 0 %
HCT: 44 % (ref 39.0–52.0)
HCT: 42.6 % (ref 39.0–52.0)
Hemoglobin: 15.7 g/dL (ref 13.0–17.0)
Hemoglobin: 15 g/dL (ref 13.0–17.0)
Immature Granulocytes: 0 %
Immature Granulocytes: 1 %
Lymphocytes Relative: 15 %
Lymphocytes Relative: 19 %
Lymphs Abs: 2 10*3/uL (ref 0.7–4.0)
Lymphs Abs: 2.4 10*3/uL (ref 0.7–4.0)
MCH: 31.4 pg (ref 26.0–34.0)
MCH: 31.3 pg (ref 26.0–34.0)
MCHC: 35.7 g/dL (ref 30.0–36.0)
MCHC: 35.2 g/dL (ref 30.0–36.0)
MCV: 88 fL (ref 80.0–100.0)
MCV: 88.8 fL (ref 80.0–100.0)
Monocytes Absolute: 1.4 10*3/uL — ABNORMAL HIGH (ref 0.1–1.0)
Monocytes Absolute: 1.4 10*3/uL — ABNORMAL HIGH (ref 0.1–1.0)
Monocytes Relative: 10 %
Monocytes Relative: 11 %
Neutro Abs: 10 10*3/uL — ABNORMAL HIGH (ref 1.7–7.7)
Neutro Abs: 8.9 10*3/uL — ABNORMAL HIGH (ref 1.7–7.7)
Neutrophils Relative %: 75 %
Neutrophils Relative %: 69 %
Platelets: 280 10*3/uL (ref 150–400)
Platelets: 261 10*3/uL (ref 150–400)
RBC: 5 MIL/uL (ref 4.22–5.81)
RBC: 4.8 MIL/uL (ref 4.22–5.81)
RDW: 13 % (ref 11.5–15.5)
RDW: 12.9 % (ref 11.5–15.5)
WBC: 13.4 10*3/uL — ABNORMAL HIGH (ref 4.0–10.5)
WBC: 12.8 10*3/uL — ABNORMAL HIGH (ref 4.0–10.5)
nRBC: 0 % (ref 0.0–0.2)
nRBC: 0 % (ref 0.0–0.2)

## 2019-01-11 LAB — COMPREHENSIVE METABOLIC PANEL
ALT: 19 U/L (ref 0–44)
AST: 36 U/L (ref 15–41)
Albumin: 4.1 g/dL (ref 3.5–5.0)
Alkaline Phosphatase: 104 U/L (ref 38–126)
Anion gap: 18 — ABNORMAL HIGH (ref 5–15)
BUN: 104 mg/dL — ABNORMAL HIGH (ref 8–23)
CO2: 21 mmol/L — ABNORMAL LOW (ref 22–32)
Calcium: 9.4 mg/dL (ref 8.9–10.3)
Chloride: 89 mmol/L — ABNORMAL LOW (ref 98–111)
Creatinine, Ser: 4.48 mg/dL — ABNORMAL HIGH (ref 0.61–1.24)
GFR calc Af Amer: 13 mL/min — ABNORMAL LOW (ref >60)
GFR calc non Af Amer: 12 mL/min — ABNORMAL LOW (ref >60)
Glucose, Bld: 141 mg/dL — ABNORMAL HIGH (ref 70–99)
Potassium: 3 mmol/L — ABNORMAL LOW (ref 3.5–5.1)
Sodium: 128 mmol/L — ABNORMAL LOW (ref 135–145)
Total Bilirubin: 1.6 mg/dL — ABNORMAL HIGH (ref 0.3–1.2)
Total Protein: 7.9 g/dL (ref 6.5–8.1)

## 2019-01-11 LAB — BASIC METABOLIC PANEL
Anion gap: 18 — ABNORMAL HIGH (ref 5–15)
Anion gap: 14 (ref 5–15)
BUN: 107 mg/dL — ABNORMAL HIGH (ref 8–23)
BUN: 100 mg/dL — ABNORMAL HIGH (ref 8–23)
CO2: 19 mmol/L — ABNORMAL LOW (ref 22–32)
CO2: 21 mmol/L — ABNORMAL LOW (ref 22–32)
Calcium: 9.1 mg/dL (ref 8.9–10.3)
Calcium: 8.4 mg/dL — ABNORMAL LOW (ref 8.9–10.3)
Chloride: 92 mmol/L — ABNORMAL LOW (ref 98–111)
Chloride: 93 mmol/L — ABNORMAL LOW (ref 98–111)
Creatinine, Ser: 4.34 mg/dL — ABNORMAL HIGH (ref 0.61–1.24)
Creatinine, Ser: 3.75 mg/dL — ABNORMAL HIGH (ref 0.61–1.24)
GFR calc Af Amer: 14 mL/min — ABNORMAL LOW (ref >60)
GFR calc Af Amer: 17 mL/min — ABNORMAL LOW (ref >60)
GFR calc non Af Amer: 12 mL/min — ABNORMAL LOW (ref >60)
GFR calc non Af Amer: 14 mL/min — ABNORMAL LOW (ref >60)
Glucose, Bld: 141 mg/dL — ABNORMAL HIGH (ref 70–99)
Glucose, Bld: 151 mg/dL — ABNORMAL HIGH (ref 70–99)
Potassium: 3.1 mmol/L — ABNORMAL LOW (ref 3.5–5.1)
Potassium: 3 mmol/L — ABNORMAL LOW (ref 3.5–5.1)
Sodium: 129 mmol/L — ABNORMAL LOW (ref 135–145)
Sodium: 128 mmol/L — ABNORMAL LOW (ref 135–145)

## 2019-01-11 LAB — TROPONIN I (HIGH SENSITIVITY)
Troponin I (High Sensitivity): 140 ng/L (ref <18)
Troponin I (High Sensitivity): 144 ng/L (ref <18)

## 2019-01-11 LAB — MAGNESIUM: Magnesium: 3.5 mg/dL — ABNORMAL HIGH (ref 1.7–2.4)

## 2019-01-11 LAB — SODIUM, URINE, RANDOM: Sodium, Ur: 13 mmol/L

## 2019-01-11 LAB — CREATININE, URINE, RANDOM: Creatinine, Urine: 200.55 mg/dL

## 2019-01-11 LAB — GLUCOSE, CAPILLARY: Glucose-Capillary: 181 mg/dL — ABNORMAL HIGH (ref 70–99)

## 2019-01-11 LAB — SARS CORONAVIRUS 2 (TAT 6-24 HRS): SARS Coronavirus 2: NEGATIVE

## 2019-01-11 MED ORDER — TAMSULOSIN HCL 0.4 MG PO CAPS
0.4000 mg | ORAL_CAPSULE | Freq: Every day | ORAL | Status: DC
  Administered 2019-01-11 – 2019-01-13 (×3): 0.4 mg via ORAL
  Filled 2019-01-11 (×3): qty 1

## 2019-01-11 MED ORDER — ONDANSETRON HCL 4 MG/2ML IJ SOLN: 4.0000 mg | Freq: Four times a day (QID) | INTRAMUSCULAR | Status: DC | PRN

## 2019-01-11 MED ORDER — SODIUM CHLORIDE 0.9 % IV SOLN
INTRAVENOUS | Status: AC
  Administered 2019-01-11 – 2019-01-12 (×4): via INTRAVENOUS

## 2019-01-11 MED ORDER — POTASSIUM CHLORIDE CRYS ER 20 MEQ PO TBCR
20.0000 meq | EXTENDED_RELEASE_TABLET | Freq: Once | ORAL | Status: DC
  Filled 2019-01-11: qty 1

## 2019-01-11 MED ORDER — ONDANSETRON HCL 4 MG PO TABS: 4.0000 mg | ORAL_TABLET | Freq: Four times a day (QID) | ORAL | Status: DC | PRN

## 2019-01-11 MED ORDER — ATORVASTATIN CALCIUM 20 MG PO TABS
20.0000 mg | ORAL_TABLET | Freq: Every day | ORAL | Status: DC
  Administered 2019-01-11 – 2019-01-13 (×3): 20 mg via ORAL
  Filled 2019-01-11 (×4): qty 1

## 2019-01-11 MED ORDER — ACETAMINOPHEN 325 MG PO TABS
650.0000 mg | ORAL_TABLET | Freq: Four times a day (QID) | ORAL | Status: DC | PRN
  Administered 2019-01-11 – 2019-01-13 (×4): 650 mg via ORAL
  Filled 2019-01-11 (×4): qty 2

## 2019-01-11 MED ORDER — APIXABAN 2.5 MG PO TABS
2.5000 mg | ORAL_TABLET | Freq: Two times a day (BID) | ORAL | Status: DC
  Administered 2019-01-11 (×2): 2.5 mg via ORAL
  Filled 2019-01-11 (×3): qty 1

## 2019-01-11 MED ORDER — SODIUM CHLORIDE 0.9% FLUSH
3.0000 mL | Freq: Two times a day (BID) | INTRAVENOUS | Status: DC
  Administered 2019-01-11 – 2019-01-14 (×7): 3 mL via INTRAVENOUS

## 2019-01-11 MED ORDER — SODIUM CHLORIDE 0.9 % IV BOLUS
500.0000 mL | Freq: Once | INTRAVENOUS | Status: AC
  Administered 2019-01-11: 500 mL via INTRAVENOUS

## 2019-01-11 MED ORDER — ACETAMINOPHEN 650 MG RE SUPP: 650.0000 mg | Freq: Four times a day (QID) | RECTAL | Status: DC | PRN

## 2019-01-11 MED ORDER — SODIUM CHLORIDE 0.9 % IV SOLN
INTRAVENOUS | Status: AC
  Administered 2019-01-11: 03:00:00 via INTRAVENOUS

## 2019-01-11 MED ORDER — ENZALUTAMIDE 40 MG PO CAPS
80.0000 mg | ORAL_CAPSULE | Freq: Two times a day (BID) | ORAL | Status: DC
  Administered 2019-01-13: 80 mg via ORAL

## 2019-01-11 MED ORDER — METOPROLOL TARTRATE 25 MG PO TABS
25.0000 mg | ORAL_TABLET | Freq: Two times a day (BID) | ORAL | Status: DC
  Administered 2019-01-11: 25 mg via ORAL
  Filled 2019-01-11: qty 1

## 2019-01-11 MED ORDER — SODIUM CHLORIDE 0.9 % IV SOLN
INTRAVENOUS | Status: DC
  Administered 2019-01-11: 02:00:00 via INTRAVENOUS

## 2019-01-11 NOTE — Progress Notes (Signed)
Dr Myna Hidalgo made aware of bloody urine.

## 2019-01-11 NOTE — Progress Notes (Signed)
PT Cancellation Note  Patient Details Name: [name] [name] MRN: [id number] DOB: 30-May-1935   Cancelled Treatment:    Reason Eval/Treat Not Completed: Medical issues which prohibited therapy RN would prefer to hold therapy today.  Refer to previous progress notes.  PT to check back as schedule permits.   Charles Andringa,KATHrine E 01/11/2019, 3:12 PM  Carmelia Bake, PT, DPT Acute Rehabilitation Services Office: [phone] Pager: [phone]

## 2019-01-11 NOTE — ED Notes (Addendum)
CRITICAL VALUE STICKER  CRITICAL VALUE: Trop 140  RECEIVER (on-site recipient of call): Maylon Cos T RN  DATE & TIME NOTIFIED: 01/11/19 216a  MESSENGER (representative from lab): Sandy Salaam  MD NOTIFIED: Myna Hidalgo, MD  TIME OF NOTIFICATION: 216a  RESPONSE: see orders

## 2019-01-11 NOTE — ED Provider Notes (Signed)
[place] DEPT Provider Note   CSN: [id number] Arrival date & time: 01/10/19  2328     History   Chief Complaint Chief Complaint  Patient presents with   Weakness   Loss of Consciousness    HPI Cinque Begley is a 83 y.o. male.     The history is provided by the patient.  Weakness Severity:  Severe Duration:  1 day Timing:  Constant Progression:  Worsening Chronicity:  Recurrent Context: not allergies, not change in medication, not decreased sleep, not drug use, not increased activity, not pinched nerve, not recent infection and not urinary tract infection   Context comment:  Cancer treatment Relieved by:  Nothing Worsened by:  Nothing Ineffective treatments:  None tried Associated symptoms: falls and syncope   Associated symptoms: no abdominal pain, no anorexia, no aphasia, no arthralgias, no ataxia, no chest pain, no cough, no diarrhea, no difficulty walking, no dizziness, no drooling, no dysphagia, no dysuria, no numbness in extremities, no fever, no foul-smelling urine, no frequency, no headaches, no hematochezia, no lethargy, no loss of consciousness, no melena, no myalgias, no nausea, no near-syncope, no seizures, no sensory-motor deficit, no shortness of breath, no stroke symptoms, no urgency, no vision change and no vomiting   Risk factors: no anemia   Loss of Consciousness Associated symptoms: weakness   Associated symptoms: no chest pain, no dizziness, no fever, no headaches, no nausea, no palpitations, no seizures, no shortness of breath, no visual change and no vomiting     Past Medical History:  Diagnosis Date   AKI (acute kidney injury) ([place]) 06/08/2018   Atrial flutter ([place])    Carcinoma of prostate ([place]) 12/30/2011   Treated with seed/radiation therapy.    Colostomy in place [facility])    Difficulty sleeping    lives in shelter currently   Diverticulitis    Dyslipidemia 12/30/2011   Encounter for care of pacemaker  06/17/2018   Frequency of urination    Heart murmur    Hypertension 04/30/11   Cardioversion 06/16/11   Near syncope 06/07/2018   Nocturia    Orthostatic hypotension 06/08/2018   Pacemaker 7/13    sick sinus syndrome/St Jude pacemaker   Paroxysmal atrial flutter ([place]) 06/16/2011   Prostate cancer ([place])    Syncope and collapse 11/20/2011   Atrial and ventricular standstill > 3 seconds.    Thyroid disease    had "overactive thyroid in 1997" - no known problem since    Patient Active Problem List   Diagnosis Date Noted   Syncope and collapse 10/27/2018   Encounter for care of pacemaker 06/17/2018   Diverticular disease 05/13/2013   Renal insufficiency 12/31/2011   Dyslipidemia 12/30/2011   Carcinoma of prostate ([place]) 12/30/2011   Pacemaker-[employer]ude Accent DR-RF dual chamber pacemaker  12/03/2011   Sick sinus syndrome ([place]) 11/28/2011   Paroxysmal atrial flutter ([place]) 06/16/2011   Essential hypertension, benign 06/16/2011    Past Surgical History:  Procedure Laterality Date   ATRIAL FLUTTER ABLATION N/A 11/21/2011   Procedure: ATRIAL FLUTTER ABLATION;  Surgeon: Thompson Grayer, MD;  Location: [facility] CATH LAB;  Service: Cardiovascular;  Laterality: N/A;   CARDIAC ELECTROPHYSIOLOGY STUDY AND ABLATION  7/13   CARDIOVERSION  06/16/2011   Procedure: CARDIOVERSION;  Surgeon: Laverda Page, MD;  Location: [place];  Service: Cardiovascular;  Laterality: N/A;   COLON SURGERY  05/13/2013   COLOSTOMY CLOSURE N/A 11/25/2013   Procedure: COLOSTOMY CLOSURE;  Surgeon: Earnstine Regal, MD;  Location: [facility] ORS;  Service: General;  Laterality: N/A;   COLOSTOMY CLOSURE  11/25/2013   PACEMAKER INSERTION  11/28/11   SJM Accent DR RF implanted by Dr Rayann Heman   PARTIAL COLECTOMY N/A 05/13/2013   Procedure: sigmoid COLECTOMY  colostomy ;  Surgeon: Earnstine Regal, MD;  Location: [facility] ORS;  Service: General;  Laterality: N/A;   PERMANENT PACEMAKER INSERTION N/A 11/28/2011   Procedure: PERMANENT  PACEMAKER INSERTION;  Surgeon: Thompson Grayer, MD;  Location[facility] LAB;  Service: Cardiovascular;  Laterality: N/A;   RADIOACTIVE SEED IMPLANT     TONSILLECTOMY          Home Medications    Prior to Admission medications   Medication Sig Start Date End Date Taking? Authorizing Provider  apixaban (ELIQUIS) 2.5 MG TABS tablet Take 1 tablet (2.5 mg total) by mouth 2 (two) times daily. 10/29/18   Mercy Riding, MD  atorvastatin (LIPITOR) 20 MG tablet TAKE 1 TABLET BY MOUTH DAILY 01/03/19   Adrian Prows, MD  metoprolol tartrate (LOPRESSOR) 25 MG tablet Take 1 tablet (25 mg total) by mouth 2 (two) times daily. 06/09/18   Geradine Girt, DO  Multiple Vitamin (MULTIVITAMIN WITH MINERALS) TABS tablet Take 1 tablet by mouth daily. 10/29/18   Mercy Riding, MD  Tamsulosin HCl (FLOMAX) 0.4 MG CAPS Take 0.4 mg by mouth at bedtime.     [provider]  XTANDI 40 MG capsule Take 80 mg by mouth 2 (two) times daily. Take 80 mg by mouth two times a day- 5 PM and 9 PM 05/26/18   [provider]    Family History Family History  Problem Relation Age of Onset   Leukemia Mother    Dementia Father     Social History Social History   Tobacco Use   Smoking status: Former Smoker    Packs/day: 0.50    Years: 50.00    Pack years: 25.00    Types: Cigarettes    Quit date: 07/20/2009    Years since quitting: 9.4   Smokeless tobacco: Never Used  Substance Use Topics   Alcohol use: No   Drug use: No     Allergies   Xarelto [rivaroxaban]   Review of Systems Review of Systems  Constitutional: Positive for appetite change. Negative for fever.  HENT: Negative for drooling.   Respiratory: Negative for cough, chest tightness and shortness of breath.   Cardiovascular: Positive for syncope. Negative for chest pain, palpitations, leg swelling and near-syncope.  Gastrointestinal: Negative for abdominal pain, anorexia, diarrhea, dysphagia, hematochezia, melena, nausea and vomiting.    Genitourinary: Negative for dysuria, frequency and urgency.  Musculoskeletal: Positive for falls. Negative for arthralgias and myalgias.  Neurological: Positive for weakness. Negative for dizziness, seizures, loss of consciousness and headaches.  All other systems reviewed and are negative.    Physical Exam Updated Vital Signs BP 116/81 (BP Location: Left Arm)    Pulse 81    Temp 98.1 F (36.7 C) (Oral)    Resp 16    SpO2 100%   Physical Exam Vitals signs and nursing note reviewed.  Constitutional:      General: He is not in acute distress.    Appearance: He is normal weight.  HENT:     Head: Normocephalic and atraumatic.     Nose: Nose normal.  Eyes:     Extraocular Movements: Extraocular movements intact.     Conjunctiva/sclera: Conjunctivae normal.     Pupils: Pupils are equal, round, and reactive to light.  Neck:  Musculoskeletal: Normal range of motion and neck supple.  Cardiovascular:     Rate and Rhythm: Normal rate and regular rhythm.     Pulses: Normal pulses.     Heart sounds: Normal heart sounds.  Pulmonary:     Effort: Pulmonary effort is normal. No respiratory distress.     Breath sounds: Normal breath sounds. No wheezing.  Abdominal:     General: Abdomen is flat. Bowel sounds are normal.     Tenderness: There is no abdominal tenderness. There is no guarding.  Musculoskeletal: Normal range of motion.        General: No tenderness.     Right lower leg: No edema.     Left lower leg: No edema.  Skin:    General: Skin is warm and dry.     Capillary Refill: Capillary refill takes less than 2 seconds.  Neurological:     General: No focal deficit present.     Mental Status: He is alert and oriented to person, place, and time.  Psychiatric:        Mood and Affect: Mood normal.        Behavior: Behavior normal.      ED Treatments / Results  Labs (all labs ordered are listed, but only abnormal results are displayed) Results for orders placed or  performed during the hospital encounter of 01/10/19  CBC with Differential/Platelet  Result Value Ref Range   WBC 13.4 (H) 4.0 - 10.5 K/uL   RBC 5.00 4.22 - 5.81 MIL/uL   Hemoglobin 15.7 13.0 - 17.0 g/dL   HCT 44.0 39.0 - 52.0 %   MCV 88.0 80.0 - 100.0 fL   MCH 31.4 26.0 - 34.0 pg   MCHC 35.7 30.0 - 36.0 g/dL   RDW 13.0 11.5 - 15.5 %   Platelets 280 150 - 400 K/uL   nRBC 0.0 0.0 - 0.2 %   Neutrophils Relative % 75 %   Neutro Abs 10.0 (H) 1.7 - 7.7 K/uL   Lymphocytes Relative 15 %   Lymphs Abs 2.0 0.7 - 4.0 K/uL   Monocytes Relative 10 %   Monocytes Absolute 1.4 (H) 0.1 - 1.0 K/uL   Eosinophils Relative 0 %   Eosinophils Absolute 0.0 0.0 - 0.5 K/uL   Basophils Relative 0 %   Basophils Absolute 0.0 0.0 - 0.1 K/uL   Immature Granulocytes 0 %   Abs Immature Granulocytes 0.06 0.00 - 0.07 K/uL  I-stat chem 8, ED (not at [facility] or [facility])  Result Value Ref Range   Sodium 127 (L) 135 - 145 mmol/L   Potassium 3.1 (L) 3.5 - 5.1 mmol/L   Chloride 94 (L) 98 - 111 mmol/L   BUN 101 (H) 8 - 23 mg/dL   Creatinine, Ser 4.70 (H) 0.61 - 1.24 mg/dL   Glucose, Bld 138 (H) 70 - 99 mg/dL   Calcium, Ion 1.03 (L) 1.15 - 1.40 mmol/L   TCO2 23 22 - 32 mmol/L   Hemoglobin 16.0 13.0 - 17.0 g/dL   HCT 47.0 39.0 - 52.0 %   Ct Head Wo Contrast  Result Date: 01/11/2019 CLINICAL DATA:  Syncope EXAM: CT HEAD WITHOUT CONTRAST TECHNIQUE: Contiguous axial images were obtained from the base of the skull through the vertex without intravenous contrast. COMPARISON:  10/27/2018 FINDINGS: Brain: No acute intracranial abnormality. Specifically, no hemorrhage, hydrocephalus, mass lesion, acute infarction, or significant intracranial injury. Vascular: No hyperdense vessel or unexpected calcification. Skull: No acute calvarial abnormality. Sinuses/Orbits: Visualized paranasal sinuses and mastoids  clear. Orbital soft tissues unremarkable. Other: None IMPRESSION: No acute intracranial abnormality. Electronically Signed   By: Rolm Baptise M.D.   On: 01/11/2019 00:14   Dg Chest Portable 1 View  Result Date: 01/10/2019 CLINICAL DATA:  Syncope EXAM: PORTABLE CHEST 1 VIEW COMPARISON:  10/27/2018 FINDINGS: Left pacer remains in place, unchanged. Stable hyperinflation of the lungs. Heart and mediastinal contours are within normal limits. No focal opacities or effusions. No acute bony abnormality. IMPRESSION: Stable hyperinflation.  No active cardiopulmonary disease. Electronically Signed   By: Rolm Baptise M.D.   On: 01/10/2019 23:50    EKG  EKG Interpretation  Date/Time:  Monday January 10 2019 23:34:44 EDT Ventricular Rate:  96 PR Interval:    QRS Duration: 111 QT Interval:  402 QTC Calculation: 431 R Axis:   -83 Text Interpretation:  Atrial fibrillation Paired ventricular premature complexes LAD, consider left anterior fascicular block Confirmed by Randal Buba, Percy Comp ([id number]) on 01/11/2019 12:07:06 AM       Radiology Dg Chest Portable 1 View  Result Date: 01/10/2019 CLINICAL DATA:  Syncope EXAM: PORTABLE CHEST 1 VIEW COMPARISON:  10/27/2018 FINDINGS: Left pacer remains in place, unchanged. Stable hyperinflation of the lungs. Heart and mediastinal contours are within normal limits. No focal opacities or effusions. No acute bony abnormality. IMPRESSION: Stable hyperinflation.  No active cardiopulmonary disease. Electronically Signed   By: Rolm Baptise M.D.   On: 01/10/2019 23:50    Procedures Procedures (including critical care time)  Medications Ordered in ED Medications  0.9 %  sodium chloride infusion (has no administration in time range)  sodium chloride 0.9 % bolus 500 mL (500 mLs Intravenous New Bag/Given 01/11/19 0052)      Final Clinical Impressions(s) / ED Diagnoses  Admit for renal failure and dehydration.      Alanah Sakuma, MD 01/11/19 [id number]

## 2019-01-11 NOTE — H&P (Addendum)
History and Physical    Hollis Tuller LXB:[id number] DOB: Sep 26, 1935 DOA: 01/10/2019  PCP: [name], MD   Patient coming from: Home   Chief Complaint: Generalized weakness, recurrent syncope, poor appetite   HPI: Rameen Quinney is a 83 y.o. male with medical history significant for prostate cancer, chronic kidney disease stage III, paroxysmal atrial fibrillation on Eliquis, and sick sinus syndrome with pacer, now presenting to the emergency department for evaluation of generalized weakness and recurrent syncopal episodes.  Patient reports loss of appetite with generalized weakness and lethargy, and has had multiple syncopal episodes this past week.  Patient reports insidious development of generalized weakness over the past few days, states that he was having some nausea with a couple episodes of nonbloody vomiting, and has not eaten or drinking much of anything in the past 5 days.  He denies any abdominal pain or diarrhea.  He denies any fevers, chills, cough, shortness of breath, or chest pain.  Reports that the syncopal episodes have occurred while urinating.  He gets lightheaded when sitting up or standing.  There has not been any chest pain or palpitations associated with this.  ED Course: Upon arrival to the ED, patient is found to be afebrile, saturating well on room air, and with 42 mmHg drop in systolic blood pressure from lying to sitting.  EKG features atrial fibrillation.  Chest x-ray is negative for acute cardiopulmonary disease.  Noncontrast head CT is negative for acute intracranial abnormality.  CBC features a leukocytosis to 13,400.  Chemistry panel is concerning for sodium of 127, BUN 101, and creatinine 4.70, up from 2.13 in June 2020.  Patient was given a bolus of normal saline, COVID-19 screening test was ordered but not yet resulted, and hospitalists are asked to admit.  Review of Systems:  All other systems reviewed and apart from HPI, are negative.  Past Medical History:   Diagnosis Date  . AKI (acute kidney injury) ([place]) 06/08/2018  . Atrial flutter ([place])   . Carcinoma of prostate ([place]) 12/30/2011   Treated with seed/radiation therapy.   . Colostomy in place [facility])   . Difficulty sleeping    lives in shelter currently  . Diverticulitis   . Dyslipidemia 12/30/2011  . Encounter for care of pacemaker 06/17/2018  . Frequency of urination   . Heart murmur   . Hypertension 04/30/11   Cardioversion 06/16/11  . Near syncope 06/07/2018  . Nocturia   . Orthostatic hypotension 06/08/2018  . Pacemaker 7/13    sick sinus syndrome/St Jude pacemaker  . Paroxysmal atrial flutter ([place]) 06/16/2011  . Prostate cancer ([place])   . Syncope and collapse 11/20/2011   Atrial and ventricular standstill > 3 seconds.   . Thyroid disease    had "overactive thyroid in 1997" - no known problem since    Past Surgical History:  Procedure Laterality Date  . ATRIAL FLUTTER ABLATION N/A 11/21/2011   Procedure: ATRIAL FLUTTER ABLATION;  Surgeon: Thompson Grayer, MD;  Location: [facility] CATH LAB;  Service: Cardiovascular;  Laterality: N/A;  . CARDIAC ELECTROPHYSIOLOGY STUDY AND ABLATION  7/13  . CARDIOVERSION  06/16/2011   Procedure: CARDIOVERSION;  Surgeon: Laverda Page, MD;  Location: [place];  Service: Cardiovascular;  Laterality: N/A;  . COLON SURGERY  05/13/2013  . COLOSTOMY CLOSURE N/A 11/25/2013   Procedure: COLOSTOMY CLOSURE;  Surgeon: Earnstine Regal, MD;  Location: [facility] ORS;  Service: General;  Laterality: N/A;  . COLOSTOMY CLOSURE  11/25/2013  . PACEMAKER INSERTION  11/28/11   SJM  Accent DR RF implanted by Dr Rayann Heman  . PARTIAL COLECTOMY N/A 05/13/2013   Procedure: sigmoid COLECTOMY  colostomy ;  Surgeon: Earnstine Regal, MD;  Location: [facility] ORS;  Service: General;  Laterality: N/A;  . PERMANENT PACEMAKER INSERTION N/A 11/28/2011   Procedure: PERMANENT PACEMAKER INSERTION;  Surgeon: Thompson Grayer, MD;  Location: [facility] CATH LAB;  Service: Cardiovascular;  Laterality: N/A;  . RADIOACTIVE SEED IMPLANT    .  TONSILLECTOMY       reports that he quit smoking about 9 years ago. His smoking use included cigarettes. He has a 25.00 pack-year smoking history. He has never used smokeless tobacco. He reports that he does not drink alcohol or use drugs.  Allergies  Allergen Reactions  . Xarelto [Rivaroxaban] Other (See Comments)    "I didn't like the way it made me feel" (patient didn't elaborate)    Family History  Problem Relation Age of Onset  . Leukemia Mother   . Dementia Father      Prior to Admission medications   Medication Sig Start Date End Date Taking? Authorizing Provider  apixaban (ELIQUIS) 2.5 MG TABS tablet Take 1 tablet (2.5 mg total) by mouth 2 (two) times daily. 10/29/18  Yes Mercy Riding, MD  atorvastatin (LIPITOR) 20 MG tablet TAKE 1 TABLET BY MOUTH DAILY Patient taking differently: Take 20 mg by mouth daily.  01/03/19  Yes Adrian Prows, MD  metoprolol tartrate (LOPRESSOR) 25 MG tablet Take 1 tablet (25 mg total) by mouth 2 (two) times daily. 06/09/18  Yes Geradine Girt, DO  Multiple Vitamin (MULTIVITAMIN WITH MINERALS) TABS tablet Take 1 tablet by mouth daily. 10/29/18  Yes Mercy Riding, MD  Tamsulosin HCl (FLOMAX) 0.4 MG CAPS Take 0.4 mg by mouth at bedtime.    Yes [provider]  XTANDI 40 MG capsule Take 80 mg by mouth 2 (two) times daily.  05/26/18  Yes [provider]    Physical Exam: Vitals:   01/10/19 2335  BP: 116/81  Pulse: 81  Resp: 16  Temp: 98.1 F (36.7 C)  TempSrc: Oral  SpO2: 100%    Constitutional: NAD, calm  Eyes: PERTLA, lids and conjunctivae normal ENMT: Mucous membranes are moist. Posterior pharynx clear of any exudate or lesions.   Neck: normal, supple, no masses, no thyromegaly Respiratory: no wheezing, no crackles. Normal respiratory effort. No accessory muscle use.  Cardiovascular: Rate ~80 and irregularly irregular. No extremity edema.  Abdomen: No distension, no tenderness, soft. Bowel sounds active.  Musculoskeletal: no  clubbing / cyanosis. No joint deformity upper and lower extremities.  Skin: no significant rashes, lesions, ulcers. Poor turgor. Neurologic: No facial asymmetry. Sensation intact. Moving all extremities.  Psychiatric: Alert and oriented to person, place, and situation. Pleasant, cooperative.    Labs on Admission: I have personally reviewed following labs and imaging studies  CBC: Recent Labs  Lab 01/11/19 0008 01/11/19 0047  WBC 13.4*  --   NEUTROABS 10.0*  --   HGB 15.7 16.0  HCT 44.0 47.0  MCV 88.0  --   PLT 280  --    Basic Metabolic Panel: Recent Labs  Lab 01/11/19 0047  NA 127*  K 3.1*  CL 94*  GLUCOSE 138*  BUN 101*  CREATININE 4.70*   GFR: CrCl cannot be calculated (Unknown ideal weight.). Liver Function Tests: No results for input(s): AST, ALT, ALKPHOS, BILITOT, PROT, ALBUMIN in the last 168 hours. No results for input(s): LIPASE, AMYLASE in the last 168 hours. No results  for input(s): AMMONIA in the last 168 hours. Coagulation Profile: No results for input(s): INR, PROTIME in the last 168 hours. Cardiac Enzymes: No results for input(s): CKTOTAL, CKMB, CKMBINDEX, TROPONINI in the last 168 hours. BNP (last 3 results) No results for input(s): PROBNP in the last 8760 hours. HbA1C: No results for input(s): HGBA1C in the last 72 hours. CBG: No results for input(s): GLUCAP in the last 168 hours. Lipid Profile: No results for input(s): CHOL, HDL, LDLCALC, TRIG, CHOLHDL, LDLDIRECT in the last 72 hours. Thyroid Function Tests: No results for input(s): TSH, T4TOTAL, FREET4, T3FREE, THYROIDAB in the last 72 hours. Anemia Panel: No results for input(s): VITAMINB12, FOLATE, FERRITIN, TIBC, IRON, RETICCTPCT in the last 72 hours. Urine analysis:    Component Value Date/Time   COLORURINE AMBER (A) 10/27/2018 1745   APPEARANCEUR TURBID (A) 10/27/2018 1745   LABSPEC 1.018 10/27/2018 1745   PHURINE 6.0 10/27/2018 1745   GLUCOSEU NEGATIVE 10/27/2018 1745   HGBUR  MODERATE (A) 10/27/2018 1745   BILIRUBINUR NEGATIVE 10/27/2018 1745   KETONESUR NEGATIVE 10/27/2018 1745   PROTEINUR 100 (A) 10/27/2018 1745   UROBILINOGEN 1.0 04/10/2013 0706   NITRITE NEGATIVE 10/27/2018 1745   LEUKOCYTESUR NEGATIVE 10/27/2018 1745   Sepsis Labs: @LABRCNTIP (procalcitonin:4,lacticidven:4) )No results found for this or any previous visit (from the past 240 hour(s)).   Radiological Exams on Admission: Ct Head Wo Contrast  Result Date: 01/11/2019 CLINICAL DATA:  Syncope EXAM: CT HEAD WITHOUT CONTRAST TECHNIQUE: Contiguous axial images were obtained from the base of the skull through the vertex without intravenous contrast. COMPARISON:  10/27/2018 FINDINGS: Brain: No acute intracranial abnormality. Specifically, no hemorrhage, hydrocephalus, mass lesion, acute infarction, or significant intracranial injury. Vascular: No hyperdense vessel or unexpected calcification. Skull: No acute calvarial abnormality. Sinuses/Orbits: Visualized paranasal sinuses and mastoids clear. Orbital soft tissues unremarkable. Other: None IMPRESSION: No acute intracranial abnormality. Electronically Signed   By: Rolm Baptise M.D.   On: 01/11/2019 00:14   Dg Chest Portable 1 View  Result Date: 01/10/2019 CLINICAL DATA:  Syncope EXAM: PORTABLE CHEST 1 VIEW COMPARISON:  10/27/2018 FINDINGS: Left pacer remains in place, unchanged. Stable hyperinflation of the lungs. Heart and mediastinal contours are within normal limits. No focal opacities or effusions. No acute bony abnormality. IMPRESSION: Stable hyperinflation.  No active cardiopulmonary disease. Electronically Signed   By: Rolm Baptise M.D.   On: 01/10/2019 23:50    EKG: Independently reviewed. Atrial fibrillation, rate 96, QTc 431 ms.   Assessment/Plan   1. Recurrent syncope; orthostatic hypotension  - Presents with generalized weakness, lightheadedness, and recurrent syncopal episodes  - He is found to have a 42 mmHg drop in SBP when sitting up and  this is likely orthostatic syncope in setting of recent anorexia, N/V, and hypovolemia  - Interrogate pacer, continue IVF hydration, repeat orthostatic vitals after fluid-resuscitation    2. Acute kidney injury superimposed on CKD III  - Creatinine is 4.70 on admission, up from 2.13 in June 2020  - Likely prerenal azotemia in setting of recent anorexia and N/V   - Check renal US and FENa, continue IVF hydration, renally-dose medications, repeat chem panel in am    3. Hyponatremia  - In setting of hypovolemia  - He was given a NS bolus in ED, has been continued on NS infusion, and chem panel will be repeated in am   4. Paroxysmal atrial fibrillation  - In rate-controlled a fib on admission  - CHADS-VASC is at least 3 (age x2, HTN)  - Continue  Eliquis, continue metoprolol as tolerated   5. Sick sinus syndrome  - Interrogate pacer as above   6. Prostate cancer  - Continue Xtandi   ADDENDUM:  High-sensitivity troponin was drawn in ED and returns elevated to 140. The patient denies any recent chest pain or SOB, this likely reflects demand ischemia, will check delta, continue cardiac monitoring.    PPE: Mask, face shield  DVT prophylaxis: Eliquis  Code Status: Full  Family Communication: Discussed with patient  Consults called: None Admission status: Observation     Vianne Bulls, MD [facility] Pager [phone]  If 7PM-7AM, please contact night-coverage [email or web address] Password [email or web address]  01/11/2019, 1:46 AM

## 2019-01-11 NOTE — ED Notes (Signed)
Pt could not stand long enough to tolerate the standing part of the orthostatic vital signs. He stated he was beginning to feel "shaky". Pt started shaking and asked could he sit back down he could not stand up much longer.

## 2019-01-11 NOTE — Plan of Care (Addendum)
83 year old male lives at home alone history of prostate cancer CKD stage III paroxysmal atrial fibrillation on Eliquis now with gross hematuria sick sinus syndrome with pacemaker admitted with generalized weakness and recurrent syncopal episodes.  Patient also reports that he lost 13 pounds in 5 days he has no appetite he has not been eating or drinking.  He denied any fever chills cough nausea vomiting diarrhea chest pain.  He was found to be profoundly orthostatic in the ER, with leukocytosis, sodium 129, BUN 101, creatinine 4.7 which is double his baseline of 2.16 October 2018.  Patient still continues to be orthostatic with weakness and has not walked yet.  I will continue IV fluids follow-up BMP later today follow-up renal ultrasound, follow-up sodium level.  Patient has history of prostate cancer now with gross hematuria will discuss with urology.  PT consult pending.  Patient is unsafe for discharge home at this time.  He needs continuous inpatient hospital stay for further work-up and treatment as above.  Addendum patient had an episode of syncope again this morning when he began shaking with eyes rolled back lasting for 5 to 10 seconds.  He was unable to stand to check orthostatics.  Will discuss with Dr. Einar Gip regarding pacemaker interrogation.  I will stop beta-blocker for now with his heart rate ranging anywhere from 50 to the 60s with orthostatic hypotension.

## 2019-01-11 NOTE — Progress Notes (Signed)
Pt sitting on edge of bed and tech reported that pt "fell over", in the bed, he began "shaking" and eyes rolled back. It was reported to have lasted 5-10 seconds. During this event it was reported that pt did not respond to the tech. However, when the shaking stopped, pt immidiately became responsive, oriented and appropriate. Vitals were stable after this episode. Pt also unable to stand for orthostatics. Stated he was too weak. MD aware. Will continue to monitor

## 2019-01-12 DIAGNOSIS — I4892 Unspecified atrial flutter: Secondary | ICD-10-CM

## 2019-01-12 DIAGNOSIS — Z950 Presence of cardiac pacemaker: Secondary | ICD-10-CM

## 2019-01-12 DIAGNOSIS — I495 Sick sinus syndrome: Secondary | ICD-10-CM

## 2019-01-12 DIAGNOSIS — E860 Dehydration: Principal | ICD-10-CM

## 2019-01-12 DIAGNOSIS — R55 Syncope and collapse: Secondary | ICD-10-CM

## 2019-01-12 DIAGNOSIS — Z45018 Encounter for adjustment and management of other part of cardiac pacemaker: Secondary | ICD-10-CM

## 2019-01-12 LAB — COMPREHENSIVE METABOLIC PANEL
ALT: 15 U/L (ref 0–44)
AST: 30 U/L (ref 15–41)
Albumin: 3 g/dL — ABNORMAL LOW (ref 3.5–5.0)
Alkaline Phosphatase: 81 U/L (ref 38–126)
Anion gap: 10 (ref 5–15)
BUN: 91 mg/dL — ABNORMAL HIGH (ref 8–23)
CO2: 20 mmol/L — ABNORMAL LOW (ref 22–32)
Calcium: 7.8 mg/dL — ABNORMAL LOW (ref 8.9–10.3)
Chloride: 98 mmol/L (ref 98–111)
Creatinine, Ser: 2.92 mg/dL — ABNORMAL HIGH (ref 0.61–1.24)
GFR calc Af Amer: 22 mL/min — ABNORMAL LOW (ref >60)
GFR calc non Af Amer: 19 mL/min — ABNORMAL LOW (ref >60)
Glucose, Bld: 148 mg/dL — ABNORMAL HIGH (ref 70–99)
Potassium: 2.8 mmol/L — ABNORMAL LOW (ref 3.5–5.1)
Sodium: 128 mmol/L — ABNORMAL LOW (ref 135–145)
Total Bilirubin: 0.8 mg/dL (ref 0.3–1.2)
Total Protein: 6 g/dL — ABNORMAL LOW (ref 6.5–8.1)

## 2019-01-12 LAB — URINE CULTURE: Culture: >=100000 — AB

## 2019-01-12 LAB — CBC
HCT: 31.9 % — ABNORMAL LOW (ref 39.0–52.0)
Hemoglobin: 11.1 g/dL — ABNORMAL LOW (ref 13.0–17.0)
MCH: 31.4 pg (ref 26.0–34.0)
MCHC: 34.8 g/dL (ref 30.0–36.0)
MCV: 90.1 fL (ref 80.0–100.0)
Platelets: 209 10*3/uL (ref 150–400)
RBC: 3.54 MIL/uL — ABNORMAL LOW (ref 4.22–5.81)
RDW: 12.9 % (ref 11.5–15.5)
WBC: 10.4 10*3/uL (ref 4.0–10.5)
nRBC: 0 % (ref 0.0–0.2)

## 2019-01-12 LAB — GLUCOSE, CAPILLARY: Glucose-Capillary: 141 mg/dL — ABNORMAL HIGH (ref 70–99)

## 2019-01-12 MED ORDER — APIXABAN 2.5 MG PO TABS
2.5000 mg | ORAL_TABLET | Freq: Two times a day (BID) | ORAL | Status: DC
  Administered 2019-01-14: 11:00:00 2.5 mg via ORAL
  Filled 2019-01-12: qty 1

## 2019-01-12 MED ORDER — POTASSIUM CHLORIDE CRYS ER 20 MEQ PO TBCR
40.0000 meq | EXTENDED_RELEASE_TABLET | Freq: Once | ORAL | Status: AC
  Administered 2019-01-12: 40 meq via ORAL
  Filled 2019-01-12: qty 2

## 2019-01-12 MED ORDER — BOOST / RESOURCE BREEZE PO LIQD CUSTOM
1.0000 | Freq: Two times a day (BID) | ORAL | Status: DC
  Administered 2019-01-12 – 2019-01-14 (×4): 1 via ORAL

## 2019-01-12 MED ORDER — ADULT MULTIVITAMIN W/MINERALS CH
1.0000 | ORAL_TABLET | Freq: Every day | ORAL | Status: DC
  Administered 2019-01-12 – 2019-01-14 (×3): 1 via ORAL
  Filled 2019-01-12 (×3): qty 1

## 2019-01-12 MED ORDER — ENSURE ENLIVE PO LIQD
237.0000 mL | ORAL | Status: DC
  Administered 2019-01-12: 237 mL via ORAL

## 2019-01-12 NOTE — Progress Notes (Signed)
Initial Nutrition Assessment  DOCUMENTATION CODES:   Not applicable  INTERVENTION:  - will order Boost Breeze BID, each supplement provides 250 kcal and 9 grams of protein. - will order Ensure Enlive once/day, each supplement provides 350 kcal and 20 grams of protein. - will order daily multivitamin with minerals. - continue to encourage PO intakes.   NUTRITION DIAGNOSIS:   Inadequate oral intake related to decreased appetite, vomiting, nausea, acute illness as evidenced by per patient/family report.  GOAL:   Patient will meet greater than or equal to 90% of their needs  MONITOR:   PO intake, Supplement acceptance, Labs, Weight trends  REASON FOR ASSESSMENT:   Malnutrition Screening Tool  ASSESSMENT:   83 y.o. male with medical history significant for prostate cancer, CKD stage 3, afib on Eliquis, and sick sinus syndrome with pacer. He presented to the ED for evaluation of generalized weakness and recurrent syncopal episodes x1 week. He reported some nausea with a few episodes of non-bloody emesis PTA. He denied abdominal pain or diarrhea PTA. He reported that syncopal episodes occurred while urinating and that he gets lightheaded when sitting up from a laying position or standing. EKG in the ED showed afib. CXR negative for acute cardiopulmonary disease. Head CT negative for acute intracranial abnormality.  Per review, patient was able to eat 100% of breakfast this AM; this meal provided 701 kcal, 30 grams protein. Patient did not experience abdominal pain or increased nausea or sensation of being about to vomit during/after meal. Patient reports that for the 5 days PTA he was feeling very weak and much more tired than usual. He was also having intermittent periods of N/V, no abdominal pain, and overall decreased appetite/decreased interest in eating d/t how he was feeling. Prior to this, appetite was at baseline and was good most days.   Per chart review, current weight is 147 lb  and weight on 11/17/18 was 162 lb. This indicates 15 lb weight loss (9% body weight) in the past 1.5 months; significant for time frame. Weight had been stable from [place].Patient does not currently meet criteria for malnutrition, but is at high risk should PO intakes again decrease. Of note, patient on high rate IV fluids.   Per notes: - recurrent syncope/orthostatic hypotension--thought to be 2/2 poor PO intakes, N/V, hypovolemia - AKI on CKD stage 3--thought to be pre-renal azotemia d/t poor PO intakes and N/V - hyponatremia and hypokalemia - afib - prostate cancer--on xtandi   Labs reviewed; CBGs: 181 and 141 mg/dl, Na: 128 mmol/l, K: 2.8 mmol/l, BUN: 91 mg/dl, creatinine: 2.92 mg/dl, Ca: 7.8 mg/dl, GFR: 19 ml/min.  Medications reviewed; 20 mEq K-Dur x1 dose 9/8 (refused), 40 mEq K-Dur x1 dose 9/9 IVF; NS @ 125 ml/hr.     NUTRITION - FOCUSED PHYSICAL EXAM:  completed to upper body; no muscle and no fat wasting.   Diet Order:   Diet Order            Diet Heart Room service appropriate? Yes; Fluid consistency: Thin  Diet effective now              EDUCATION NEEDS:   No education needs have been identified at this time  Skin:  Skin Assessment: Reviewed RN Assessment  Last BM:  PTA/unknown  Height:   Ht Readings from Last 1 Encounters:  01/11/19 5\' 7"  (1.702 m)    Weight:   Wt Readings from Last 1 Encounters:  01/11/19 66.8 kg    Ideal Body Weight:  67.3  kg  BMI:  Body mass index is 23.07 kg/m.  Estimated Nutritional Needs:   Kcal:  1870-2135 kcal  Protein:  85-95 grams  Fluid:  >/= 1.8 L/day     Jarome Matin, MS, RD, LDN, [facility] Inpatient Clinical Dietitian Pager # [phone] After hours/weekend pager # [phone]

## 2019-01-12 NOTE — Progress Notes (Signed)
CCMD reported that patient had a 2.38 second pause.  PT VSS.  MD made aware.  Will continue to monitor closely.

## 2019-01-12 NOTE — Progress Notes (Signed)
Patient HR spiking up into the 150's, unsustained.  Unable to capture rhythm on EKG.  PT asymptomatic.  MD made aware. Will continue to monitor closely.

## 2019-01-12 NOTE — Consult Note (Signed)
CARDIOLOGY CONSULT NOTE  Patient ID: [name] [name] MRN: [id number] DOB/AGE: 81-11-1935 81 y.o.  Admit date: 01/10/2019 Referring Physician Jacki Cones, MD Primary Physician:  [name], MD Reason for Consultation syncope, please evaluate pacemaker  HPI:    [name] [name]  is a 83 y.o. male African-American male with history of paroxysmal atrial flutter status post ablation but had recurrence of atrial flutter and hence has been on anticoagulation, sick sinus syndrome S/P pacemaker implantation in 2013, also has had atrial fibrillation on pacemaker interrogation.  He is now diagnosed with prostate cancer and is undergoing chemotherapy for the same.  Since then patient has had at least 2 admissions with dehydration, failure to thrive and acute renal failure along with syncope.  Patient now again admitted with near syncope and episodes of syncope that started about a week ago.  He was then found to have acute renal failure due to dehydration, marked lethargy, mild leukocytosis and hyponatremia.  He was hydrated and admitted to the hospital for further evaluation.  Over the past 2 days he has felt better since being in the hospital, nausea, abdominal discomfort has improved, states that he feels well.  Denies chest pain or shortness of breath but states that he feels generally weak.  Episodes of syncope described as when he tries to get up to go to the bathroom, suddenly feels dizzy and feels like he is going to pass out.  He has had 3 episodes of frank syncope but no injury.  No episodes have occurred when he is in bed or when he is sitting.  Past Medical History:  Diagnosis Date  . AKI (acute kidney injury) ([place]) 06/08/2018  . Atrial flutter ([place])   . Carcinoma of prostate ([place]) 12/30/2011   Treated with seed/radiation therapy.   . Colostomy in place [facility])   . Difficulty sleeping    lives in shelter currently  . Diverticulitis   . Dyslipidemia 12/30/2011  . Encounter for care of  pacemaker 06/17/2018  . Frequency of urination   . Heart murmur   . Hypertension 04/30/11   Cardioversion 06/16/11  . Near syncope 06/07/2018  . Nocturia   . Orthostatic hypotension 06/08/2018  . Pacemaker 7/13    sick sinus syndrome/St Jude pacemaker  . Paroxysmal atrial flutter ([place]) 06/16/2011  . Prostate cancer ([place])   . Syncope and collapse 11/20/2011   Atrial and ventricular standstill > 3 seconds.   . Thyroid disease    had "overactive thyroid in 1997" - no known problem since    Past Surgical History:  Procedure Laterality Date  . ATRIAL FLUTTER ABLATION N/A 11/21/2011   Procedure: ATRIAL FLUTTER ABLATION;  Surgeon: Thompson Grayer, MD;  Location[facility] CATH LAB;  Service: Cardiovascular;  Laterality: N/A;  . CARDIAC ELECTROPHYSIOLOGY STUDY AND ABLATION  7/13  . CARDIOVERSION  06/16/2011   Procedure: CARDIOVERSION;  Surgeon: Laverda Page, MD;  Location: [place];  Service: Cardiovascular;  Laterality: N/A;  . COLON SURGERY  05/13/2013  . COLOSTOMY CLOSURE N/A 11/25/2013   Procedure: COLOSTOMY CLOSURE;  Surgeon: Earnstine Regal, MD;  Location: [facility] ORS;  Service: General;  Laterality: N/A;  . COLOSTOMY CLOSURE  11/25/2013  . PACEMAKER INSERTION  11/28/11   SJM Accent DR RF implanted by Dr Rayann Heman  . PARTIAL COLECTOMY N/A 05/13/2013   Procedure: sigmoid COLECTOMY  colostomy ;  Surgeon: Earnstine Regal, MD;  Location: [facility] ORS;  Service: General;  Laterality: N/A;  . PERMANENT PACEMAKER INSERTION N/A 11/28/2011   Procedure: PERMANENT  PACEMAKER INSERTION;  Surgeon: Thompson Grayer, MD;  Location[facility] C[facility] LAB;  Service: Cardiovascular;  Laterality: N/A;  . RADIOACTIVE SEED IMPLANT    . TONSILLECTOMY      Social History   Socioeconomic History  . Marital status: Single    Spouse name: Not on file  . Number of children: 6  . Years of education: Not on file  . Highest education level: Not on file  Occupational History  . Not on file  Social Needs  . Financial resource strain: Not on file  . Food  insecurity    Worry: Not on file    Inability: Not on file  . Transportation needs    Medical: Not on file    Non-medical: Not on file  Tobacco Use  . Smoking status: Former Smoker    Packs/day: 0.50    Years: 50.00    Pack years: 25.00    Types: Cigarettes    Quit date: 07/20/2009    Years since quitting: 9.4  . Smokeless tobacco: Never Used  Substance and Sexual Activity  . Alcohol use: No  . Drug use: No  . Sexual activity: Never  Lifestyle  . Physical activity    Days per week: Not on file    Minutes per session: Not on file  . Stress: Not on file  Relationships  . Social Herbalist on phone: Not on file    Gets together: Not on file    Attends religious service: Not on file    Active member of club or organization: Not on file    Attends meetings of clubs or organizations: Not on file    Relationship status: Not on file  . Intimate partner violence    Fear of current or ex partner: Not on file    Emotionally abused: Not on file    Physically abused: Not on file    Forced sexual activity: Not on file  Other Topics Concern  . Not on file  Social History Narrative  . Not on file     09/08 0701 - 09/09 0700 In: 2360.6 [P.O.:240; I.V.:2120.6] Out: -   No intake/output data recorded.   ROS  Review of Systems  Constitution: Positive for malaise/fatigue and night sweats. Negative for chills, decreased appetite and weight gain.  Cardiovascular: Positive for near-syncope. Negative for dyspnea on exertion, leg swelling and syncope.  Endocrine: Negative for cold intolerance.  Hematologic/Lymphatic: Does not bruise/bleed easily.  Musculoskeletal: Negative for joint swelling.  Gastrointestinal: Negative for abdominal pain, anorexia, change in bowel habit, hematochezia and melena.  Genitourinary: Positive for hematuria and hesitancy.  Neurological: Negative for headaches and light-headedness.  Psychiatric/Behavioral: Negative for depression and substance  abuse.  All other systems reviewed and are negative.   Objective  Blood pressure (!) 158/73, pulse 94, temperature 98 F (36.7 C), temperature source Oral, resp. rate 18, height 5\' 7"  (1.702 m), weight 66.8 kg, SpO2 100 %. Body mass index is 23.07 kg/m. Vitals with BMI 01/12/2019 01/12/2019 01/12/2019  Height - - -  Weight - - -  BMI - - -  Systolic - 657 846  Diastolic - 73 64  Pulse 94 - 75    Blood pressure (!) 158/73, pulse 94, temperature 98 F (36.7 C), temperature source Oral, resp. rate 18, height 5\' 7"  (1.702 m), weight 66.8 kg, SpO2 100 %. Body mass index is 23.07 kg/m.  Physical Exam  Constitutional: He appears well-developed and well-nourished. No distress.  HENT:  Head: Atraumatic.  Eyes: Conjunctivae are normal.  Neck: Neck supple. No JVD present. No thyromegaly present.  Cardiovascular: Normal rate, regular rhythm, normal heart sounds and intact distal pulses. Exam reveals no gallop.  No murmur heard. Pulses:      Carotid pulses are 2+ on the right side and 2+ on the left side.      Femoral pulses are 2+ on the right side and 2+ on the left side.      Dorsalis pedis pulses are 1+ on the right side and 1+ on the left side.       Posterior tibial pulses are 1+ on the right side and 1+ on the left side.  No edema, no JVD.  Pulmonary/Chest: Effort normal and breath sounds normal.  Abdominal: Soft. Bowel sounds are normal.  Musculoskeletal: Normal range of motion.  Neurological: He is alert.  Skin: Skin is warm and dry.  Psychiatric: He has a normal mood and affect.    Radiology: Ct Head Wo Contrast  Result Date: 01/11/2019 CLINICAL DATA:  Syncope EXAM: CT HEAD WITHOUT CONTRAST TECHNIQUE: Contiguous axial images were obtained from the base of the skull through the vertex without intravenous contrast. COMPARISON:  10/27/2018 FINDINGS: Brain: No acute intracranial abnormality. Specifically, no hemorrhage, hydrocephalus, mass lesion, acute infarction, or significant  intracranial injury. Vascular: No hyperdense vessel or unexpected calcification. Skull: No acute calvarial abnormality. Sinuses/Orbits: Visualized paranasal sinuses and mastoids clear. Orbital soft tissues unremarkable. Other: None IMPRESSION: No acute intracranial abnormality. Electronically Signed   By: Rolm Baptise M.D.   On: 01/11/2019 00:14   US Renal  Result Date: 01/11/2019 CLINICAL DATA:  Acute on chronic renal failure EXAM: RENAL / URINARY TRACT ULTRASOUND COMPLETE COMPARISON:  October 27, 2018 FINDINGS: Right Kidney: Renal measurements: 8.7 x 4.5 x 4.7 = volume: 97 mL. There is mildly increased echogenicity throughout. No mass or hydronephrosis visualized. Left Kidney: Renal measurements: 9.3 x 4.6 x 4.6 = volume: 102 mL. There is mildly increased echogenicity throughout. No mass or hydronephrosis visualized. Bladder: There is a partially distended bladder. IMPRESSION: Diffusely increased parenchymal echogenicity, consistent with medical renal disease. No acute hydronephrosis or renal calculi. Electronically Signed   By: Prudencio Pair M.D.   On: 01/11/2019 02:41   Dg Chest Portable 1 View  Result Date: 01/10/2019 CLINICAL DATA:  Syncope EXAM: PORTABLE CHEST 1 VIEW COMPARISON:  10/27/2018 FINDINGS: Left pacer remains in place, unchanged. Stable hyperinflation of the lungs. Heart and mediastinal contours are within normal limits. No focal opacities or effusions. No acute bony abnormality. IMPRESSION: Stable hyperinflation.  No active cardiopulmonary disease. Electronically Signed   By: Rolm Baptise M.D.   On: 01/10/2019 23:50    Laboratory examination:   Recent Labs    01/11/19 0318 01/11/19 1230 01/12/19 0351  NA 129* 128* 128*  K 3.1* 3.0* 2.8*  CL 92* 93* 98  CO2 19* 21* 20*  GLUCOSE 141* 151* 148*  BUN 107* 100* 91*  CREATININE 4.34* 3.75* 2.92*  CALCIUM 9.1 8.4* 7.8*  GFRNONAA 12* 14* 19*  GFRAA 14* 17* 22*   CMP Latest Ref Rng & Units 01/12/2019 01/11/2019 01/11/2019  Glucose 70 - 99  mg/dL 148(H) 151(H) 141(H)  BUN 8 - 23 mg/dL 91(H) 100(H) 107(H)  Creatinine 0.61 - 1.24 mg/dL 2.92(H) 3.75(H) 4.34(H)  Sodium 135 - 145 mmol/L 128(L) 128(L) 129(L)  Potassium 3.5 - 5.1 mmol/L 2.8(L) 3.0(L) 3.1(L)  Chloride 98 - 111 mmol/L 98 93(L) 92(L)  CO2 22 - 32 mmol/L 20(L) 21(L)  19(L)  Calcium 8.9 - 10.3 mg/dL 7.8(L) 8.4(L) 9.1  Total Protein 6.5 - 8.1 g/dL 6.0(L) - -  Total Bilirubin 0.3 - 1.2 mg/dL 0.8 - -  Alkaline Phos 38 - 126 U/L 81 - -  AST 15 - 41 U/L 30 - -  ALT 0 - 44 U/L 15 - -   CBC Latest Ref Rng & Units 01/12/2019 01/11/2019 01/11/2019  WBC 4.0 - 10.5 K/uL 10.4 12.8(H) -  Hemoglobin 13.0 - 17.0 g/dL 11.1(L) 15.0 16.0  Hematocrit 39.0 - 52.0 % 31.9(L) 42.6 47.0  Platelets 150 - 400 K/uL 209 261 -   Lipid Panel  No results found for: CHOL, TRIG, HDL, CHOLHDL, VLDL, LDLCALC, LDLDIRECT HEMOGLOBIN A1C Lab Results  Component Value Date   HGBA1C 6.2 (H) 11/21/2011   MPG 131 (H) 11/21/2011   TSH Recent Labs    06/09/18 0216 10/27/18 1535  TSH 0.884 0.361   Medications   Prior to Admission medications   Medication Sig Start Date End Date Taking? Authorizing Provider  apixaban (ELIQUIS) 2.5 MG TABS tablet Take 1 tablet (2.5 mg total) by mouth 2 (two) times daily. 10/29/18  Yes Mercy Riding, MD  atorvastatin (LIPITOR) 20 MG tablet TAKE 1 TABLET BY MOUTH DAILY Patient taking differently: Take 20 mg by mouth daily.  01/03/19  Yes Adrian Prows, MD  metoprolol tartrate (LOPRESSOR) 25 MG tablet Take 1 tablet (25 mg total) by mouth 2 (two) times daily. 06/09/18  Yes Geradine Girt, DO  Multiple Vitamin (MULTIVITAMIN WITH MINERALS) TABS tablet Take 1 tablet by mouth daily. 10/29/18  Yes Mercy Riding, MD  Tamsulosin HCl (FLOMAX) 0.4 MG CAPS Take 0.4 mg by mouth at bedtime.    Yes [provider]  XTANDI 40 MG capsule Take 80 mg by mouth 2 (two) times daily.  05/26/18  Yes [provider]     Current Outpatient Medications  Medication Instructions  . apixaban  (ELIQUIS) 2.5 mg, Oral, 2 times daily  . atorvastatin (LIPITOR) 20 MG tablet TAKE 1 TABLET BY MOUTH DAILY  . metoprolol tartrate (LOPRESSOR) 25 mg, Oral, 2 times daily  . Multiple Vitamin (MULTIVITAMIN WITH MINERALS) TABS tablet 1 tablet, Oral, Daily  . tamsulosin (FLOMAX) 0.4 mg, Oral, Daily at bedtime  . Xtandi 80 mg, Oral, 2 times daily    Cardiac Studies:   Echocardiogram 06/08/2018:  1. The left ventricle has hyperdynamic systolic function of >35%. The cavity size is normal. There is no increas ed left ventricular wall thickness. Echo evidence of normal diastolic filling patterns. Normal left ventricular filling pressures.  2. The aortic valve is tricuspid in structure. There is mild thickening and mild calcification of the aortic valve.   Assessment  1.  Encounter for pacemaker interrogation. 2.  Saint Jude dual-chamber pacemaker in situ. 3. Sick sinus syndrome 4. Recurrent syncope related to dehydration and orthostasis.  Do not suspect cardiac etiology.  Normal pacemaker function. 5.  Paroxysmal atrial fibrillation/atrial flutter CHA2DS2-VASc Score is 3.0.  Remote pacemaker check  6.28.20: 2353 atrial high rate episodes detected. The longest lasted 0:07:21:08 in duration. There was a 5% cumulative atrial arrhythmia burden. EGMs show AFL. Health trends do not demonstrate significant abnormality. Battery longevity is 4.0 years . RA pacing is 94.0 %, RV pacing is 5.5 %.  In person pacemaker interrogation 01/12/2019:  AP 93% VP 6%.  >7000 AMS episodes, 1 longest 31 minutes suggestive of atrial tachycardia, otherwise mostly less than 2 minutes.  High ventricular rate episodes, EGM  reveal atrial tachycardia.  Health trends including impedance and thresholds are within normal limits.  Battery longevity is 4 years.  Underlying ventricular escape at 30 bpm, patient is pacemaker dependent. Paroxysmal episodes ofnon-sustained atrial tachycardia/atrial flutter,normal threshold and impedance  of the leads, normal function.  Recommendations:   Pacemaker interrogation reveals normal functioning pacemaker.  No episodes of pacemaker dysfunction or sinus arrest noted.  2.3 seconds sinus arrest that was witnessed today was related to pacemaker functioning to evaluate for thresholds.  He does have paroxysmal episodes of atrial tachycardia but they are very brief and last less than 1 to 2 minutes.  There is only been one episode that sustained for 31 minutes.  Do not suspect any of his episodes of syncope are related to underlying atrial tachycardia.  He is pacemaker dependent.  He needs to be on anticoagulation due to cardioembolic risk.  Unless he has continued hematuria or severe hematuria then we have no option but to discontinue the anticoagulation.  Please do not hesitate to call me if you have any other further questions.  Adrian Prows, MD, [facility] 01/12/2019, 8:04 PM [facility] [place] Pager: [phone] Office: [phone] If no answer Cell [phone]

## 2019-01-12 NOTE — Evaluation (Signed)
Physical Therapy Evaluation Patient Details Name: [name] [name] MRN: [id number] DOB: [birth year] Today's Date: 01/12/2019   History of Present Illness  83 yr old man admitted with recurrent syncope secondary to severe orthostatic hypotension. PMHx: colostomy, prostate cancer, atrial flutter, Colostomy, dyslipidemia, orthostatic hypotension, sick sinus syndrome/s/p St. Jude pacemaker, and thyroid disease.  Clinical Impression  Pt admitted with above diagnosis. Pt currently with functional limitations due to the deficits listed below (see PT Problem List). Pt will benefit from skilled PT to increase their independence and safety with mobility to allow discharge to the venue listed below.  Pt agreeable to OOB however reports feeling poorly today.  Orthostatics obtained and RN documented.  Pt attempted to ambulate however felt weak and requested to sit down.  BP 137/74 mmHg upon sitting in recliner.  Recommend 24/7 assist upon d/c and pt lives alone.  If unable to have family assist, may need SNF if pt unable to progress mobility.    Follow Up Recommendations SNF;Supervision/Assistance - 24 hour    Equipment Recommendations  Rolling walker with 5" wheels    Recommendations for Other Services       Precautions / Restrictions Precautions Precautions: Fall      Mobility  Bed Mobility Overal bed mobility: Needs Assistance Bed Mobility: Supine to Sit     Supine to sit: Supervision        Transfers Overall transfer level: Needs assistance Equipment used: None Transfers: Sit to/from Stand Sit to Stand: Min guard         General transfer comment: min/guard for safety  Ambulation/Gait Ambulation/Gait assistance: Min guard;Min assist Gait Distance (Feet): 8 Feet Assistive device: IV Pole Gait Pattern/deviations: Step-through pattern;Decreased stride length     General Gait Details: pt declined RW today since he typically doesn't use assistive device, pt reports weak LEs and  requested chair after a few feet, RN provided Physicist, medical    Modified Rankin (Stroke Patients Only)       Balance Overall balance assessment: History of Falls                                           Pertinent Vitals/Pain Pain Assessment: No/denies pain    Home Living Family/patient expects to be discharged to:: Private residence Living Arrangements: Alone Available Help at Discharge: Family;Available PRN/intermittently Type of Home: Apartment Home Access: Level entry     Home Layout: One level Home Equipment: None      Prior Function Level of Independence: Independent               Hand Dominance        Extremity/Trunk Assessment        Lower Extremity Assessment Lower Extremity Assessment: Generalized weakness       Communication   Communication: No difficulties  Cognition Arousal/Alertness: Awake/alert Behavior During Therapy: Flat affect Overall Cognitive Status: Within Functional Limits for tasks assessed                                        General Comments      Exercises     Assessment/Plan    PT Assessment Patient needs continued PT services  PT Problem List Decreased strength;Decreased mobility;Decreased balance;Decreased  knowledge of use of DME;Decreased activity tolerance       PT Treatment Interventions DME instruction;Gait training;Balance training;Therapeutic exercise;Functional mobility training;Therapeutic activities;Patient/family education    PT Goals (Current goals can be found in the Care Plan section)  Acute Rehab PT Goals PT Goal Formulation: With patient Time For Goal Achievement: 01/26/19 Potential to Achieve Goals: Good    Frequency Min 3X/week   Barriers to discharge        Co-evaluation               AM-PAC PT "6 Clicks" Mobility  Outcome Measure Help needed turning from your back to your side while in a flat bed  without using bedrails?: A Little Help needed moving from lying on your back to sitting on the side of a flat bed without using bedrails?: A Little Help needed moving to and from a bed to a chair (including a wheelchair)?: A Little Help needed standing up from a chair using your arms (e.g., wheelchair or bedside chair)?: A Little Help needed to walk in hospital room?: A Little Help needed climbing 3-5 steps with a railing? : A Little 6 Click Score: 18    End of Session Equipment Utilized During Treatment: Gait belt Activity Tolerance: Patient limited by fatigue Patient left: with call [place]/phone within reach;in chair(pt aware to use call [place] for assist out of recliner for safety) Nurse Communication: Mobility status PT Visit Diagnosis: Difficulty in walking, not elsewhere classified (R26.2)    Time: 2336-1224 PT Time Calculation (min) (ACUTE ONLY): 20 min   Charges:   PT Evaluation $PT Eval Low Complexity: [place], PT, DPT Acute Rehabilitation Services Office: [phone] Pager: [phone]  Trena Platt 01/12/2019, 3:34 PM

## 2019-01-12 NOTE — Progress Notes (Signed)
PROGRESS NOTE    [name] [name]  GGE:[id number] DOB: 07-25-1935 DOA: 01/10/2019 PCP: [name], MD    Brief Narrative:83 y.o. male with medical history significant for prostate cancer, chronic kidney disease stage III, paroxysmal atrial fibrillation on Eliquis, and sick sinus syndrome with pacer, now presenting to the emergency department for evaluation of generalized weakness and recurrent syncopal episodes.  Patient reports loss of appetite with generalized weakness and lethargy, and has had multiple syncopal episodes this past week.  Patient reports insidious development of generalized weakness over the past few days, states that he was having some nausea with a couple episodes of nonbloody vomiting, and has not eaten or drinking much of anything in the past 5 days.  He denies any abdominal pain or diarrhea.  He denies any fevers, chills, cough, shortness of breath, or chest pain.  Reports that the syncopal episodes have occurred while urinating.  He gets lightheaded when sitting up or standing.  There has not been any chest pain or palpitations associated with this.  ED Course: Upon arrival to the ED, patient is found to be afebrile, saturating well on room air, and with 42 mmHg drop in systolic blood pressure from lying to sitting.  EKG features atrial fibrillation.  Chest x-ray is negative for acute cardiopulmonary disease.  Noncontrast head CT is negative for acute intracranial abnormality.  CBC features a leukocytosis to 13,400.  Chemistry panel is concerning for sodium of 127, BUN 101, and creatinine 4.70, up from 2.13 in June 2020.  Patient was given a bolus of normal saline, COVID-19 screening test was ordered but not yet resulted, and hospitalists are asked to admit.  Assessment & Plan:   Principal Problem:   Orthostatic syncope Active Problems:   Paroxysmal atrial flutter (HCC)   Sick sinus syndrome (HCC)   Carcinoma of prostate (HCC)   Syncope   Acute renal failure superimposed  on stage 3 chronic kidney disease (HCC)   Hyponatremia   Hypokalemia    #1 recurrent syncope secondary to severe orthostatic hypotension treating with IV hydration.  PT working with him today get trying to get him out of bed and recheck his orthostatic vital signs today.  Orthostatics were not done yesterday as patient was severely symptomatic and passed out while trying to sit him up and standing up.  Per RN report patient pacer was interrogated and the rep verbally told the patient patient's nurse that pacer is working fine though I do not see any documentation.  #2 AKI with CKD stage III improving with IV fluids.  Creatinine down to 2.92 from 4.48 yesterday.  #3 prostate cancer on xtandi   #4 paroxysmal atrial fibrillation patient is on low-dose Eliquis metoprolol was stopped due to bradycardia.  RN reported a two-point second pause continue to hold metoprolol.  #5 hyponatremia hypovolemic sodium 128 remaining stable  #6 history of sick sinus syndrome status post pacemaker  DVT prophylaxis Eliquis CODE STATUS full code Family communication none discussed with patient Consults none   Nutrition Problem: Inadequate oral intake Etiology: decreased appetite, vomiting, nausea, acute illness     Signs/Symptoms: per patient/family report    Interventions: MVI, Boost Breeze, Ensure Enlive (each supplement provides 350kcal and 20 grams of protein)  Estimated body mass index is 23.07 kg/m as calculated from the following:   Height as of this encounter: 5\' 7"  (1.702 m).   Weight as of this encounter: 66.8 kg.    Subjective: Patient is resting in bed awake alert feels better than  yesterday appetite appears to be better no nausea vomiting diarrhea abdominal pain chest pain shortness of breath reported  Objective: Vitals:   01/12/19 0509 01/12/19 0840 01/12/19 1300 01/12/19 1433  BP: 96/65 125/66  110/64  Pulse: 65 77  75  Resp: 18 16  18   Temp: 98.7 F (37.1 C) 98.5 F (36.9  C) 98.2 F (36.8 C) 98 F (36.7 C)  TempSrc: Oral Oral Axillary Oral  SpO2: 100% 99%  100%  Weight:      Height:        Intake/Output Summary (Last 24 hours) at 01/12/2019 1459 Last data filed at 01/12/2019 1400 Gross per 24 hour  Intake 1904.58 ml  Output 1000 ml  Net 904.58 ml   Filed Weights   01/11/19 0316  Weight: 66.8 kg    Examination:  General exam: Appears calm and comfortable  Respiratory system: Clear to auscultation. Respiratory effort normal. Cardiovascular system: S1 & S2 heard, RRR. No JVD, murmurs, rubs, gallops or clicks. No pedal edema. Gastrointestinal system: Abdomen is nondistended, soft and nontender. No organomegaly or masses felt. Normal bowel sounds heard. Central nervous system: Alert and oriented. No focal neurological deficits. Extremities: Symmetric 5 x 5 power. Skin: No rashes, lesions or ulcers Psychiatry: Judgement and insight appear normal. Mood & affect appropriate.     Data Reviewed: I have personally reviewed following labs and imaging studies  CBC: Recent Labs  Lab 01/11/19 0008 01/11/19 0047 01/11/19 0318 01/12/19 0351  WBC 13.4*  --  12.8* 10.4  NEUTROABS 10.0*  --  8.9*  --   HGB 15.7 16.0 15.0 11.1*  HCT 44.0 47.0 42.6 31.9*  MCV 88.0  --  88.8 90.1  PLT 280  --  261 379   Basic Metabolic Panel: Recent Labs  Lab 01/11/19 0008 01/11/19 0047 01/11/19 0318 01/11/19 1230 01/12/19 0351  NA 128* 127* 129* 128* 128*  K 3.0* 3.1* 3.1* 3.0* 2.8*  CL 89* 94* 92* 93* 98  CO2 21*  --  19* 21* 20*  GLUCOSE 141* 138* 141* 151* 148*  BUN 104* 101* 107* 100* 91*  CREATININE 4.48* 4.70* 4.34* 3.75* 2.92*  CALCIUM 9.4  --  9.1 8.4* 7.8*  MG  --   --  3.5*  --   --    GFR: Estimated Creatinine Clearance: 18.9 mL/min (A) (by C-G formula based on SCr of 2.92 mg/dL (H)). Liver Function Tests: Recent Labs  Lab 01/11/19 0008 01/12/19 0351  AST 36 30  ALT 19 15  ALKPHOS 104 81  BILITOT 1.6* 0.8  PROT 7.9 6.0*  ALBUMIN 4.1  3.0*   No results for input(s): LIPASE, AMYLASE in the last 168 hours. No results for input(s): AMMONIA in the last 168 hours. Coagulation Profile: No results for input(s): INR, PROTIME in the last 168 hours. Cardiac Enzymes: No results for input(s): CKTOTAL, CKMB, CKMBINDEX, TROPONINI in the last 168 hours. BNP (last 3 results) No results for input(s): PROBNP in the last 8760 hours. HbA1C: No results for input(s): HGBA1C in the last 72 hours. CBG: Recent Labs  Lab 01/11/19 0606 01/12/19 0636  GLUCAP 181* 141*   Lipid Profile: No results for input(s): CHOL, HDL, LDLCALC, TRIG, CHOLHDL, LDLDIRECT in the last 72 hours. Thyroid Function Tests: No results for input(s): TSH, T4TOTAL, FREET4, T3FREE, THYROIDAB in the last 72 hours. Anemia Panel: No results for input(s): VITAMINB12, FOLATE, FERRITIN, TIBC, IRON, RETICCTPCT in the last 72 hours. Sepsis Labs: No results for input(s): PROCALCITON, LATICACIDVEN in the last  168 hours.  Recent Results (from the past 240 hour(s))  SARS CORONAVIRUS 2 (TAT 6-24 HRS) Nasopharyngeal Nasopharyngeal Swab     Status: None   Collection Time: 01/11/19 12:08 AM   Specimen: Nasopharyngeal Swab  Result Value Ref Range Status   SARS Coronavirus 2 NEGATIVE NEGATIVE Final    Comment: (NOTE) SARS-CoV-2 target nucleic acids are NOT DETECTED. The SARS-CoV-2 RNA is generally detectable in upper and lower respiratory specimens during the acute phase of infection. Negative results do not preclude SARS-CoV-2 infection, do not rule out co-infections with other pathogens, and should not be used as the sole basis for treatment or other patient management decisions. Negative results must be combined with clinical observations, patient history, and epidemiological information. The expected result is Negative. Fact Sheet for Patients: SugarRoll.be Fact Sheet for Healthcare Providers: https://www.[employer] [employer].com/ This  test is not yet approved or cleared by the [place] FDA and  has been authorized for detection and/or diagnosis of SARS-CoV-2 by FDA under an Emergency Use Authorization (EUA). This EUA will remain  in effect (meaning this test can be used) for the duration of the COVID-19 declaration under Section 56 4(b)(1) of the Act, 21 U.S.C. section 360bbb-3(b)(1), unless the authorization is terminated or revoked sooner. Performed at [facility], [place] [address], [place], [place] [address]   Urine culture     Status: Abnormal   Collection Time: 01/11/19  3:30 AM   Specimen: Urine, Clean Catch  Result Value Ref Range Status   Specimen Description   Final    URINE, CLEAN CATCH Performed at [facility], [place] [address], [place], [place] [address]    Special Requests   Final    NONE Performed at [facility], [place] [address] [place], [place] [address]    Culture >=100,000 COLONIES/mL ESCHERICHIA COLI (A)  Final   Report Status 01/12/2019 FINAL  Final   Organism ID, Bacteria ESCHERICHIA COLI (A)  Final      Susceptibility   Escherichia coli - MIC*    AMPICILLIN >=32 RESISTANT Resistant     CEFAZOLIN <=4 SENSITIVE Sensitive     CEFTRIAXONE <=1 SENSITIVE Sensitive     CIPROFLOXACIN <=0.25 SENSITIVE Sensitive     GENTAMICIN <=1 SENSITIVE Sensitive     IMIPENEM <=0.25 SENSITIVE Sensitive     NITROFURANTOIN <=16 SENSITIVE Sensitive     TRIMETH/SULFA <=20 SENSITIVE Sensitive     AMPICILLIN/SULBACTAM 16 INTERMEDIATE Intermediate     PIP/TAZO <=4 SENSITIVE Sensitive     Extended ESBL NEGATIVE Sensitive     * >=100,000 COLONIES/mL ESCHERICHIA COLI         Radiology Studies: Ct Head Wo Contrast  Result Date: 01/11/2019 CLINICAL DATA:  Syncope EXAM: CT HEAD WITHOUT CONTRAST TECHNIQUE: Contiguous axial images were obtained from the base of the skull through the vertex without intravenous contrast. COMPARISON:  10/27/2018 FINDINGS: Brain: No acute intracranial  abnormality. Specifically, no hemorrhage, hydrocephalus, mass lesion, acute infarction, or significant intracranial injury. Vascular: No hyperdense vessel or unexpected calcification. Skull: No acute calvarial abnormality. Sinuses/Orbits: Visualized paranasal sinuses and mastoids clear. Orbital soft tissues unremarkable. Other: None IMPRESSION: No acute intracranial abnormality. Electronically Signed   By: Rolm Baptise M.D.   On: 01/11/2019 00:14   US Renal  Result Date: 01/11/2019 CLINICAL DATA:  Acute on chronic renal failure EXAM: RENAL / URINARY TRACT ULTRASOUND COMPLETE COMPARISON:  October 27, 2018 FINDINGS: Right Kidney: Renal measurements: 8.7 x 4.5 x 4.7 = volume: 97 mL. There is  mildly increased echogenicity throughout. No mass or hydronephrosis visualized. Left Kidney: Renal measurements: 9.3 x 4.6 x 4.6 = volume: 102 mL. There is mildly increased echogenicity throughout. No mass or hydronephrosis visualized. Bladder: There is a partially distended bladder. IMPRESSION: Diffusely increased parenchymal echogenicity, consistent with medical renal disease. No acute hydronephrosis or renal calculi. Electronically Signed   By: Prudencio Pair M.D.   On: 01/11/2019 02:41   Dg Chest Portable 1 View  Result Date: 01/10/2019 CLINICAL DATA:  Syncope EXAM: PORTABLE CHEST 1 VIEW COMPARISON:  10/27/2018 FINDINGS: Left pacer remains in place, unchanged. Stable hyperinflation of the lungs. Heart and mediastinal contours are within normal limits. No focal opacities or effusions. No acute bony abnormality. IMPRESSION: Stable hyperinflation.  No active cardiopulmonary disease. Electronically Signed   By: Rolm Baptise M.D.   On: 01/10/2019 23:50        Scheduled Meds: . [START ON 01/13/2019] apixaban  2.5 mg Oral BID  . atorvastatin  20 mg Oral q1800  . enzalutamide  80 mg Oral BID  . feeding supplement  1 Container Oral BID BM  . feeding supplement (ENSURE ENLIVE)  237 mL Oral Q24H  . multivitamin with minerals   1 tablet Oral Daily  . potassium chloride  20 mEq Oral Once  . sodium chloride flush  3 mL Intravenous Q12H  . tamsulosin  0.4 mg Oral QHS   Continuous Infusions: . sodium chloride 125 mL/hr at 01/12/19 1042     LOS: 1 day     Georgette Shell, MD [facility]  If 7PM-7AM, please contact night-coverage [email or web address] Password [email or web address] 01/12/2019, 2:59 PM

## 2019-01-13 LAB — BASIC METABOLIC PANEL
Anion gap: 10 (ref 5–15)
BUN: 63 mg/dL — ABNORMAL HIGH (ref 8–23)
CO2: 21 mmol/L — ABNORMAL LOW (ref 22–32)
Calcium: 7.7 mg/dL — ABNORMAL LOW (ref 8.9–10.3)
Chloride: 104 mmol/L (ref 98–111)
Creatinine, Ser: 2 mg/dL — ABNORMAL HIGH (ref 0.61–1.24)
GFR calc Af Amer: 35 mL/min — ABNORMAL LOW (ref >60)
GFR calc non Af Amer: 31 mL/min — ABNORMAL LOW (ref >60)
Glucose, Bld: 128 mg/dL — ABNORMAL HIGH (ref 70–99)
Potassium: 3.4 mmol/L — ABNORMAL LOW (ref 3.5–5.1)
Sodium: 135 mmol/L (ref 135–145)

## 2019-01-13 LAB — CBC
HCT: 29.2 % — ABNORMAL LOW (ref 39.0–52.0)
Hemoglobin: 10.1 g/dL — ABNORMAL LOW (ref 13.0–17.0)
MCH: 31.2 pg (ref 26.0–34.0)
MCHC: 34.6 g/dL (ref 30.0–36.0)
MCV: 90.1 fL (ref 80.0–100.0)
Platelets: 211 10*3/uL (ref 150–400)
RBC: 3.24 MIL/uL — ABNORMAL LOW (ref 4.22–5.81)
RDW: 13.3 % (ref 11.5–15.5)
WBC: 9.9 10*3/uL (ref 4.0–10.5)
nRBC: 0 % (ref 0.0–0.2)

## 2019-01-13 LAB — GLUCOSE, CAPILLARY: Glucose-Capillary: 119 mg/dL — ABNORMAL HIGH (ref 70–99)

## 2019-01-13 LAB — HEMOGLOBIN AND HEMATOCRIT, BLOOD
HCT: 29.5 % — ABNORMAL LOW (ref 39.0–52.0)
Hemoglobin: 10.2 g/dL — ABNORMAL LOW (ref 13.0–17.0)

## 2019-01-13 MED ORDER — SODIUM CHLORIDE 0.9 % IV SOLN
1.0000 g | INTRAVENOUS | Status: DC
  Administered 2019-01-13: 1 g via INTRAVENOUS
  Filled 2019-01-13: qty 1
  Filled 2019-01-13: qty 10

## 2019-01-13 MED ORDER — PHENAZOPYRIDINE HCL 100 MG PO TABS
100.0000 mg | ORAL_TABLET | Freq: Two times a day (BID) | ORAL | Status: DC | PRN
  Administered 2019-01-13: 100 mg via ORAL
  Filled 2019-01-13 (×2): qty 1

## 2019-01-13 NOTE — Consult Note (Signed)
Urology Consult  Referring physician: Dr. Rodena Piety Reason for referral: gross hematuria  Chief Complaint: Gross hematuria  History of Present Illness: [name] [name] is a 83yo with a history of BPH, metastatic castrate resistant prostate cancer who was admitted with new weakness, syncopal episodes and gross hematuria. The patient developed worsening urinary urgency, frequency and dysuria 6 days ago and gross hematuria 2 days ago.No history of reccurent UTIs.   He is currently on flomax for his BPH. He is on xtandi for his castrate resistant prostate cancer. He is on eliquis for a-fib. He had a foley catheter placed on admission which is draining dark red urine. No other associated symptoms. No exacerbating/alleviaitng events   Past Medical History:  Diagnosis Date  . AKI (acute kidney injury) ([place]) 06/08/2018  . Atrial flutter ([place])   . Carcinoma of prostate ([place]) 12/30/2011   Treated with seed/radiation therapy.   . Colostomy in place [facility])   . Difficulty sleeping    lives in shelter currently  . Diverticulitis   . Dyslipidemia 12/30/2011  . Encounter for care of pacemaker 06/17/2018  . Frequency of urination   . Heart murmur   . Hypertension 04/30/11   Cardioversion 06/16/11  . Near syncope 06/07/2018  . Nocturia   . Orthostatic hypotension 06/08/2018  . Pacemaker 7/13    sick sinus syndrome/[employer] pacemaker  . Paroxysmal atrial flutter ([place]) 06/16/2011  . Prostate cancer ([place])   . Syncope and collapse 11/20/2011   Atrial and ventricular standstill > 3 seconds.   . Thyroid disease    had "overactive thyroid in 1997" - no known problem since   Past Surgical History:  Procedure Laterality Date  . ATRIAL FLUTTER ABLATION N/A 11/21/2011   Procedure: ATRIAL FLUTTER ABLATION;  Surgeon: Thompson Grayer, MD;  Location: [facility] CATH LAB;  Service: Cardiovascular;  Laterality: N/A;  . CARDIAC ELECTROPHYSIOLOGY STUDY AND ABLATION  7/13  . CARDIOVERSION  06/16/2011   Procedure: CARDIOVERSION;  Surgeon: Laverda Page, MD;  Location: [place];  Service: Cardiovascular;  Laterality: N/A;  . COLON SURGERY  05/13/2013  . COLOSTOMY CLOSURE N/A 11/25/2013   Procedure: COLOSTOMY CLOSURE;  Surgeon: Earnstine Regal, MD;  Location: [facility] ORS;  Service: General;  Laterality: N/A;  . COLOSTOMY CLOSURE  11/25/2013  . PACEMAKER INSERTION  11/28/11   SJM Accent DR RF implanted by Dr Rayann Heman  . PARTIAL COLECTOMY N/A 05/13/2013   Procedure: sigmoid COLECTOMY  colostomy ;  Surgeon: Earnstine Regal, MD;  Location: [facility] ORS;  Service: General;  Laterality: N/A;  . PERMANENT PACEMAKER INSERTION N/A 11/28/2011   Procedure: PERMANENT PACEMAKER INSERTION;  Surgeon: Thompson Grayer, MD;  Location: [facility] CATH LAB;  Service: Cardiovascular;  Laterality: N/A;  . RADIOACTIVE SEED IMPLANT    . TONSILLECTOMY      Medications: I have reviewed the patient's current medications. Allergies:  Allergies  Allergen Reactions  . Xarelto [Rivaroxaban] Other (See Comments)    "I didn't like the way it made me feel" (patient didn't elaborate)    Family History  Problem Relation Age of Onset  . Leukemia Mother   . Dementia Father    Social History:  reports that he quit smoking about 9 years ago. His smoking use included cigarettes. He has a 25.00 pack-year smoking history. He has never used smokeless tobacco. He reports that he does not drink alcohol or use drugs.  Review of Systems  Constitutional: Positive for malaise/fatigue.  Genitourinary: Positive for dysuria, frequency, hematuria and urgency.  All  other systems reviewed and are negative.   Physical Exam:  Vital signs in last 24 hours: Temp:  [98.2 F (36.8 C)-100.2 F (37.9 C)] 100.2 F (37.9 C) (09/10 2204) Pulse Rate:  [50-74] 50 (09/10 2204) Resp:  [16-24] 24 (09/10 2204) BP: (111-126)/(52-67) 119/55 (09/10 2204) SpO2:  [98 %-100 %] 99 % (09/10 2204) Weight:  [71 kg] 71 kg (09/10 0414) Physical Exam  Constitutional: He is oriented to person, place, and time. He appears  well-developed and well-nourished.  HENT:  Head: Normocephalic and atraumatic.  Eyes: Pupils are equal, round, and reactive to light. EOM are normal.  Neck: Normal range of motion. No thyromegaly present.  Cardiovascular: Normal rate and regular rhythm.  Respiratory: Effort normal. No respiratory distress.  GI: Soft. He exhibits no distension.  Musculoskeletal: Normal range of motion.        General: No edema.  Neurological: He is alert and oriented to person, place, and time.  Skin: Skin is warm and dry.  Psychiatric: He has a normal mood and affect. His behavior is normal. Judgment and thought content normal.    Laboratory Data:  Results for orders placed or performed during the hospital encounter of 01/10/19 (from the past 72 hour(s))  SARS CORONAVIRUS 2 (TAT 6-24 HRS) Nasopharyngeal Nasopharyngeal Swab     Status: None   Collection Time: 01/11/19 12:08 AM   Specimen: Nasopharyngeal Swab  Result Value Ref Range   SARS Coronavirus 2 NEGATIVE NEGATIVE    Comment: (NOTE) SARS-CoV-2 target nucleic acids are NOT DETECTED. The SARS-CoV-2 RNA is generally detectable in upper and lower respiratory specimens during the acute phase of infection. Negative results do not preclude SARS-CoV-2 infection, do not rule out co-infections with other pathogens, and should not be used as the sole basis for treatment or other patient management decisions. Negative results must be combined with clinical observations, patient history, and epidemiological information. The expected result is Negative. Fact Sheet for Patients: SugarRoll.be Fact Sheet for Healthcare Providers: https://www.woods-mathews.com/ This test is not yet approved or cleared by the [place] FDA and  has been authorized for detection and/or diagnosis of SARS-CoV-2 by FDA under an Emergency Use Authorization (EUA). This EUA will remain  in effect (meaning this test can be used) for the  duration of the COVID-19 declaration under Section 56 4(b)(1) of the Act, 21 U.S.C. section 360bbb-3(b)(1), unless the authorization is terminated or revoked sooner. Performed at [facility], [place] [address] [place], [place] [address]   CBC with Differential/Platelet     Status: Abnormal   Collection Time: 01/11/19 12:08 AM  Result Value Ref Range   WBC 13.4 (H) 4.0 - 10.5 K/uL   RBC 5.00 4.22 - 5.81 MIL/uL   Hemoglobin 15.7 13.0 - 17.0 g/dL   HCT 44.0 39.0 - 52.0 %   MCV 88.0 80.0 - 100.0 fL   MCH 31.4 26.0 - 34.0 pg   MCHC 35.7 30.0 - 36.0 g/dL   RDW 13.0 11.5 - 15.5 %   Platelets 280 150 - 400 K/uL   nRBC 0.0 0.0 - 0.2 %   Neutrophils Relative % 75 %   Neutro Abs 10.0 (H) 1.7 - 7.7 K/uL   Lymphocytes Relative 15 %   Lymphs Abs 2.0 0.7 - 4.0 K/uL   Monocytes Relative 10 %   Monocytes Absolute 1.4 (H) 0.1 - 1.0 K/uL   Eosinophils Relative 0 %   Eosinophils Absolute 0.0 0.0 - 0.5 K/uL   Basophils Relative 0 %  Basophils Absolute 0.0 0.0 - 0.1 K/uL   Immature Granulocytes 0 %   Abs Immature Granulocytes 0.06 0.00 - 0.07 K/uL    Comment: Performed at [facility], [place] [address] [place], [place] [address]  Troponin I (High Sensitivity)     Status: Abnormal   Collection Time: 01/11/19 12:08 AM  Result Value Ref Range   Troponin I (High Sensitivity) 140 (HH) <18 ng/L    Comment: CRITICAL RESULT CALLED TO, READ BACK BY AND VERIFIED WITH: JAKE, RN @ 0215 ON 01/11/2019 C VARNER (NOTE) Elevated high sensitivity troponin I (hsTnI) values and significant  changes across serial measurements may suggest ACS but many other  chronic and acute conditions are known to elevate hsTnI results.  Refer to the Links section for chest pain algorithms and additional  guidance. Performed at [facility], [place] [address] [place], [place] [address]   Comprehensive metabolic panel     Status: Abnormal   Collection Time: 01/11/19 12:08 AM  Result  Value Ref Range   Sodium 128 (L) 135 - 145 mmol/L   Potassium 3.0 (L) 3.5 - 5.1 mmol/L   Chloride 89 (L) 98 - 111 mmol/L   CO2 21 (L) 22 - 32 mmol/L   Glucose, Bld 141 (H) 70 - 99 mg/dL   BUN 104 (H) 8 - 23 mg/dL    Comment: RESULTS CONFIRMED BY MANUAL DILUTION   Creatinine, Ser 4.48 (H) 0.61 - 1.24 mg/dL   Calcium 9.4 8.9 - 10.3 mg/dL   Total Protein 7.9 6.5 - 8.1 g/dL   Albumin 4.1 3.5 - 5.0 g/dL   AST 36 15 - 41 U/L   ALT 19 0 - 44 U/L   Alkaline Phosphatase 104 38 - 126 U/L   Total Bilirubin 1.6 (H) 0.3 - 1.2 mg/dL   GFR calc non Af Amer 12 (L) >60 mL/min   GFR calc Af Amer 13 (L) >60 mL/min   Anion gap 18 (H) 5 - 15    Comment: Performed at [facility], [place] [address] [place], [place] [address]  I-stat chem 8, ED (not at [facility] or [facility])     Status: Abnormal   Collection Time: 01/11/19 12:47 AM  Result Value Ref Range   Sodium 127 (L) 135 - 145 mmol/L   Potassium 3.1 (L) 3.5 - 5.1 mmol/L   Chloride 94 (L) 98 - 111 mmol/L   BUN 101 (H) 8 - 23 mg/dL   Creatinine, Ser 4.70 (H) 0.61 - 1.24 mg/dL   Glucose, Bld 138 (H) 70 - 99 mg/dL   Calcium, Ion 1.03 (L) 1.15 - 1.40 mmol/L   TCO2 23 22 - 32 mmol/L   Hemoglobin 16.0 13.0 - 17.0 g/dL   HCT 47.0 39.0 - 52.0 %  Troponin I (High Sensitivity)     Status: Abnormal   Collection Time: 01/11/19  3:18 AM  Result Value Ref Range   Troponin I (High Sensitivity) 144 (HH) <18 ng/L    Comment: CRITICAL VALUE NOTED.  VALUE IS CONSISTENT WITH PREVIOUSLY REPORTED AND CALLED VALUE. (NOTE) Elevated high sensitivity troponin I (hsTnI) values and significant  changes across serial measurements may suggest ACS but many other  chronic and acute conditions are known to elevate hsTnI results.  Refer to the Links section for chest pain algorithms and additional  guidance. Performed at [facility], [place] [address] [place], [place] [address]   Basic metabolic panel     Status: Abnormal  Collection Time:  01/11/19  3:18 AM  Result Value Ref Range   Sodium 129 (L) 135 - 145 mmol/L   Potassium 3.1 (L) 3.5 - 5.1 mmol/L   Chloride 92 (L) 98 - 111 mmol/L   CO2 19 (L) 22 - 32 mmol/L   Glucose, Bld 141 (H) 70 - 99 mg/dL   BUN 107 (H) 8 - 23 mg/dL    Comment: RESULTS CONFIRMED BY MANUAL DILUTION   Creatinine, Ser 4.34 (H) 0.61 - 1.24 mg/dL   Calcium 9.1 8.9 - 10.3 mg/dL   GFR calc non Af Amer 12 (L) >60 mL/min   GFR calc Af Amer 14 (L) >60 mL/min   Anion gap 18 (H) 5 - 15    Comment: Performed at [facility], [place] [address] [place], [place] [address]  Magnesium     Status: Abnormal   Collection Time: 01/11/19  3:18 AM  Result Value Ref Range   Magnesium 3.5 (H) 1.7 - 2.4 mg/dL    Comment: Performed at [facility], [place] [address] [place], [place] [address]  CBC WITH DIFFERENTIAL     Status: Abnormal   Collection Time: 01/11/19  3:18 AM  Result Value Ref Range   WBC 12.8 (H) 4.0 - 10.5 K/uL   RBC 4.80 4.22 - 5.81 MIL/uL   Hemoglobin 15.0 13.0 - 17.0 g/dL   HCT 42.6 39.0 - 52.0 %   MCV 88.8 80.0 - 100.0 fL   MCH 31.3 26.0 - 34.0 pg   MCHC 35.2 30.0 - 36.0 g/dL   RDW 12.9 11.5 - 15.5 %   Platelets 261 150 - 400 K/uL   nRBC 0.0 0.0 - 0.2 %   Neutrophils Relative % 69 %   Neutro Abs 8.9 (H) 1.7 - 7.7 K/uL   Lymphocytes Relative 19 %   Lymphs Abs 2.4 0.7 - 4.0 K/uL   Monocytes Relative 11 %   Monocytes Absolute 1.4 (H) 0.1 - 1.0 K/uL   Eosinophils Relative 0 %   Eosinophils Absolute 0.0 0.0 - 0.5 K/uL   Basophils Relative 0 %   Basophils Absolute 0.0 0.0 - 0.1 K/uL   Immature Granulocytes 1 %   Abs Immature Granulocytes 0.08 (H) 0.00 - 0.07 K/uL    Comment: Performed at [facility], [place] [address] [place], [place] [address]  Sodium, urine, random     Status: None   Collection Time: 01/11/19  3:29 AM  Result Value Ref Range   Sodium, Ur 13 mmol/L    Comment: Performed at [facility], [place] [address] [place], [place] [address]  Creatinine, urine, random     Status: None   Collection Time: 01/11/19  3:29 AM  Result Value Ref Range   Creatinine, Urine 200.55 mg/dL    Comment: Performed at [facility], [place] [address], [place],  [address]  Urinalysis, Routine w reflex microscopic     Status: Abnormal   Collection Time: 01/11/19  3:30 AM  Result Value Ref Range   Color, Urine RED (A) YELLOW    Comment: BIOCHEMICALS MAY BE AFFECTED BY COLOR   APPearance TURBID (A) CLEAR   Specific Gravity, Urine  1.005 - 1.030    TEST NOT REPORTED DUE TO COLOR INTERFERENCE OF URINE PIGMENT   pH  5.0 - 8.0    TEST NOT REPORTED DUE TO COLOR INTERFERENCE OF URINE PIGMENT   Glucose, UA (A) NEGATIVE mg/dL    TEST NOT REPORTED  DUE TO COLOR INTERFERENCE OF URINE PIGMENT   Hgb urine dipstick (A) NEGATIVE    TEST NOT REPORTED DUE TO COLOR INTERFERENCE OF URINE PIGMENT   Bilirubin Urine (A) NEGATIVE    TEST NOT REPORTED DUE TO COLOR INTERFERENCE OF URINE PIGMENT   Ketones, ur (A) NEGATIVE mg/dL    TEST NOT REPORTED DUE TO COLOR INTERFERENCE OF URINE PIGMENT   Protein, ur (A) NEGATIVE mg/dL    TEST NOT REPORTED DUE TO COLOR INTERFERENCE OF URINE PIGMENT   Nitrite (A) NEGATIVE    TEST NOT REPORTED DUE TO COLOR INTERFERENCE OF URINE PIGMENT   Leukocytes,Ua (A) NEGATIVE    TEST NOT REPORTED DUE TO COLOR INTERFERENCE OF URINE PIGMENT   RBC / HPF >50 (H) 0 - 5 RBC/hpf   WBC, UA >50 (H) 0 - 5 WBC/hpf   Bacteria, UA MANY (A) NONE SEEN   Mucus PRESENT    Hyaline Casts, UA PRESENT     Comment: Performed at [facility], [place] [address], [place], [place] [address]  Urine culture     Status: Abnormal   Collection Time: 01/11/19  3:30 AM   Specimen: Urine, Clean Catch  Result Value Ref Range   Specimen Description      URINE, CLEAN CATCH Performed at [facility], [place] [address], [place], [place] [address]    Special Requests      NONE Performed at  [facility], [place] [address] [place], [place] [address]    Culture >=100,000 COLONIES/mL ESCHERICHIA COLI (A)    Report Status 01/12/2019 FINAL    Organism ID, Bacteria ESCHERICHIA COLI (A)       Susceptibility   Escherichia coli - MIC*    AMPICILLIN >=32 RESISTANT Resistant     CEFAZOLIN <=4 SENSITIVE Sensitive     CEFTRIAXONE <=1 SENSITIVE Sensitive     CIPROFLOXACIN <=0.25 SENSITIVE Sensitive     GENTAMICIN <=1 SENSITIVE Sensitive     IMIPENEM <=0.25 SENSITIVE Sensitive     NITROFURANTOIN <=16 SENSITIVE Sensitive     TRIMETH/SULFA <=20 SENSITIVE Sensitive     AMPICILLIN/SULBACTAM 16 INTERMEDIATE Intermediate     PIP/TAZO <=4 SENSITIVE Sensitive     Extended ESBL NEGATIVE Sensitive     * >=100,000 COLONIES/mL ESCHERICHIA COLI  Glucose, capillary     Status: Abnormal   Collection Time: 01/11/19  6:06 AM  Result Value Ref Range   Glucose-Capillary 181 (H) 70 - 99 mg/dL  Basic metabolic panel     Status: Abnormal   Collection Time: 01/11/19 12:30 PM  Result Value Ref Range   Sodium 128 (L) 135 - 145 mmol/L   Potassium 3.0 (L) 3.5 - 5.1 mmol/L   Chloride 93 (L) 98 - 111 mmol/L   CO2 21 (L) 22 - 32 mmol/L   Glucose, Bld 151 (H) 70 - 99 mg/dL   BUN 100 (H) 8 - 23 mg/dL    Comment: RESULTS CONFIRMED BY MANUAL DILUTION   Creatinine, Ser 3.75 (H) 0.61 - 1.24 mg/dL   Calcium 8.4 (L) 8.9 - 10.3 mg/dL   GFR calc non Af Amer 14 (L) >60 mL/min   GFR calc Af Amer 17 (L) >60 mL/min   Anion gap 14 5 - 15    Comment: Performed at [facility], [place] [address], [place], [place] [address]  CBC     Status: Abnormal   Collection Time: 01/12/19  3:51 AM  Result Value Ref Range   WBC 10.4 4.0 -  10.5 K/uL   RBC 3.54 (L) 4.22 - 5.81 MIL/uL   Hemoglobin 11.1 (L) 13.0 - 17.0 g/dL    Comment: REPEATED TO VERIFY DELTA CHECK NOTED    HCT 31.9 (L) 39.0 - 52.0 %   MCV 90.1 80.0 - 100.0 fL   MCH 31.4 26.0 - 34.0 pg   MCHC 34.8 30.0 - 36.0 g/dL   RDW 12.9 11.5 - 15.5 %    Platelets 209 150 - 400 K/uL   nRBC 0.0 0.0 - 0.2 %    Comment: Performed at [facility], [place] [address] [place], [place] [address]  Comprehensive metabolic panel     Status: Abnormal   Collection Time: 01/12/19  3:51 AM  Result Value Ref Range   Sodium 128 (L) 135 - 145 mmol/L   Potassium 2.8 (L) 3.5 - 5.1 mmol/L   Chloride 98 98 - 111 mmol/L   CO2 20 (L) 22 - 32 mmol/L   Glucose, Bld 148 (H) 70 - 99 mg/dL   BUN 91 (H) 8 - 23 mg/dL   Creatinine, Ser 2.92 (H) 0.61 - 1.24 mg/dL   Calcium 7.8 (L) 8.9 - 10.3 mg/dL   Total Protein 6.0 (L) 6.5 - 8.1 g/dL   Albumin 3.0 (L) 3.5 - 5.0 g/dL   AST 30 15 - 41 U/L   ALT 15 0 - 44 U/L   Alkaline Phosphatase 81 38 - 126 U/L   Total Bilirubin 0.8 0.3 - 1.2 mg/dL   GFR calc non Af Amer 19 (L) >60 mL/min   GFR calc Af Amer 22 (L) >60 mL/min   Anion gap 10 5 - 15    Comment: Performed at [facility], [place] [address] [place], [place] [address]  Glucose, capillary     Status: Abnormal   Collection Time: 01/12/19  6:36 AM  Result Value Ref Range   Glucose-Capillary 141 (H) 70 - 99 mg/dL  CBC     Status: Abnormal   Collection Time: 01/13/19  3:56 AM  Result Value Ref Range   WBC 9.9 4.0 - 10.5 K/uL   RBC 3.24 (L) 4.22 - 5.81 MIL/uL   Hemoglobin 10.1 (L) 13.0 - 17.0 g/dL   HCT 29.2 (L) 39.0 - 52.0 %   MCV 90.1 80.0 - 100.0 fL   MCH 31.2 26.0 - 34.0 pg   MCHC 34.6 30.0 - 36.0 g/dL   RDW 13.3 11.5 - 15.5 %   Platelets 211 150 - 400 K/uL   nRBC 0.0 0.0 - 0.2 %    Comment: Performed at [facility], [place] [address], [place], [place] [address]  Basic metabolic panel     Status: Abnormal   Collection Time: 01/13/19  3:56 AM  Result Value Ref Range   Sodium 135 135 - 145 mmol/L    Comment: DELTA CHECK NOTED   Potassium 3.4 (L) 3.5 - 5.1 mmol/L    Comment: DELTA CHECK NOTED   Chloride 104 98 - 111 mmol/L   CO2 21 (L) 22 - 32 mmol/L   Glucose, Bld 128 (H) 70 - 99 mg/dL   BUN 63 (H) 8 - 23  mg/dL   Creatinine, Ser 2.00 (H) 0.61 - 1.24 mg/dL   Calcium 7.7 (L) 8.9 - 10.3 mg/dL   GFR calc non Af Amer 31 (L) >60 mL/min   GFR calc Af Amer 35 (L) >60 mL/min   Anion gap 10 5 - 15    Comment: Performed at [facility], [place] [address], [place], [place] [address]  Glucose, capillary     Status: Abnormal   Collection Time: 01/13/19  4:25 AM  Result Value Ref Range   Glucose-Capillary 119 (H) 70 - 99 mg/dL   Recent Results (from the past 240 hour(s))  SARS CORONAVIRUS 2 (TAT 6-24 HRS) Nasopharyngeal Nasopharyngeal Swab     Status: None   Collection Time: 01/11/19 12:08 AM   Specimen: Nasopharyngeal Swab  Result Value Ref Range Status   SARS Coronavirus 2 NEGATIVE NEGATIVE Final    Comment: (NOTE) SARS-CoV-2 target nucleic acids are NOT DETECTED. The SARS-CoV-2 RNA is generally detectable in upper and lower respiratory specimens during the acute phase of infection. Negative results do not preclude SARS-CoV-2 infection, do not rule out co-infections with other pathogens, and should not be used as the sole basis for treatment or other patient management decisions. Negative results must be combined with clinical observations, patient history, and epidemiological information. The expected result is Negative. Fact Sheet for Patients: SugarRoll.be Fact Sheet for Healthcare Providers: https://www.woods-mathews.com/ This test is not yet approved or cleared by the [place] FDA and  has been authorized for detection and/or diagnosis of SARS-CoV-2 by FDA under an Emergency Use Authorization (EUA). This EUA will remain  in effect (meaning this test can be used) for the duration of the COVID-19 declaration under Section 56 4(b)(1) of the Act, 21 U.S.C. section 360bbb-3(b)(1), unless the authorization is terminated or revoked sooner. Performed at [facility], [place] [address], [place], [place] [address]   Urine culture      Status: Abnormal   Collection Time: 01/11/19  3:30 AM   Specimen: Urine, Clean Catch  Result Value Ref Range Status   Specimen Description   Final    URINE, CLEAN CATCH Performed at [facility], [place] [address], [place], [place] [address]    Special Requests   Final    NONE Performed at [facility], [place] [address] [place], [place] [address]    Culture >=100,000 COLONIES/mL ESCHERICHIA COLI (A)  Final   Report Status 01/12/2019 FINAL  Final   Organism ID, Bacteria ESCHERICHIA COLI (A)  Final      Susceptibility   Escherichia coli - MIC*    AMPICILLIN >=32 RESISTANT Resistant     CEFAZOLIN <=4 SENSITIVE Sensitive     CEFTRIAXONE <=1 SENSITIVE Sensitive     CIPROFLOXACIN <=0.25 SENSITIVE Sensitive     GENTAMICIN <=1 SENSITIVE Sensitive     IMIPENEM <=0.25 SENSITIVE Sensitive     NITROFURANTOIN <=16 SENSITIVE Sensitive     TRIMETH/SULFA <=20 SENSITIVE Sensitive     AMPICILLIN/SULBACTAM 16 INTERMEDIATE Intermediate     PIP/TAZO <=4 SENSITIVE Sensitive     Extended ESBL NEGATIVE Sensitive     * >=100,000 COLONIES/mL ESCHERICHIA COLI   Creatinine: Recent Labs    01/11/19 0008 01/11/19 0047 01/11/19 0318 01/11/19 1230 01/12/19 0351 01/13/19 0356  CREATININE 4.48* 4.70* 4.34* 3.75* 2.92* 2.00*   Baseline Creatinine: 2  Impression/Assessment:  82yo with gross hematuria, BPH, and prosatte cancer  Plan:  1. Gross hematuria: I discussed the various causes with the patient and given his change in his LUTS , the gross hematuria is likely related to UTI. Please continue broad spectrum antibiotics pending his urine culture. COntinue foley catheter to straight drain.  2. BPH: continue flomax 3. Castrate resistant prostate cancer: Continue xtandi  Nicolette Bang 01/13/2019, 10:27 PM

## 2019-01-13 NOTE — Progress Notes (Addendum)
Enzalutamide [place] [facility]) Hold Criteria: . Seizures . Hgb < 8 . WBC < 2000 . ANC < 1000 . Pltc < 50K . Active infection   Patient started on Ceftriaxone for UTI per MD. Therefore, Enzalutamide will be discontinued per policy per hold criteria above.   Lindell Spar, PharmD, BCPS Clinical Pharmacist  01/13/2019 8:32 PM

## 2019-01-13 NOTE — Progress Notes (Signed)
Verbal order to hold this morning's Eliquis. Eulas Post, RN

## 2019-01-13 NOTE — Progress Notes (Addendum)
PROGRESS NOTE    [name] [name]  CBS:[id number] DOB: 12-Jul-1935 DOA: 01/10/2019 PCP: [name], MD  Brief Narrative:83 y.o.malewith medical history significant forprostate cancer, chronic kidney disease stage III, paroxysmal atrial fibrillation on Eliquis, and sick sinus syndrome with pacer, now presenting to the emergency department for evaluation of generalized weakness and recurrent syncopal episodes. Patient reports loss of appetite with generalized weakness and lethargy, and has had multiple syncopal episodes this past week.Patient reports insidious development of generalized weakness over the past few days, states that he was having some nausea with a couple episodes of nonbloody vomiting, and has not eaten or drinking much of anything in the past 5 days. He denies any abdominal pain or diarrhea. He denies any fevers, chills, cough, shortness of breath, or chest pain. Reports that the syncopal episodes have occurred while urinating. He gets lightheaded when sitting up or standing. There has not been any chest pain or palpitations associated with this.  ED Course:Upon arrival to the ED, patient is found to be afebrile, saturating well on room air, and with 42 mmHg drop in systolic blood pressure from lying to sitting. EKG features atrial fibrillation. Chest x-ray is negative for acute cardiopulmonary disease. Noncontrast head CT is negative for acute intracranial abnormality. CBC features a leukocytosis to 13,400. Chemistry panel is concerning for sodium of 127, BUN 101, and creatinine 4.70, up from 2.13 in June 2020. Patient was given a bolus of normal saline, COVID-19 screening test was ordered but not yet resulted, and hospitalists are asked to admit.   Assessment & Plan:   Principal Problem:   Orthostatic syncope Active Problems:   Paroxysmal atrial flutter (HCC)   Sick sinus syndrome (HCC)   Carcinoma of prostate (HCC)   Syncope   Acute renal failure superimposed on  stage 3 chronic kidney disease (HCC)   Hyponatremia   Hypokalemia   #1 recurrent syncope secondary to severe orthostatic hypotension treated with IV hydration. Patient with decreased appetite and not eating or drinking prior to admit  #2 AKI with CKD stage III improving with IV fluids.  Creatinine down to 2.0 from 4.48   #3 prostate cancer on xtandi   #4 paroxysmal atrial fibrillation patient is on low-dose Eliquis metoprolol was stopped due to bradycardia.  RN reported a two-point second pause continue to hold metoprolol.pacer intererogated 9/9  #5 hyponatremia hypovolemic sodium 135 remaining stable  #6 history of sick sinus syndrome status post pacemaker  #7 gross hematuria -eliquis on hold history of prostate cancer hemoglobin dropped from 11 to 10 overnight.urology consulted.patient still with ongoing gross hematuria.  Need to monitor overnight follow-up hemoglobin in the morning.  Patient needs continuous inpatient hospital stay. #8 e coli uti start rocephin   DVT prophylaxis Eliquis on hold  CODE STATUS full code Family communication none discussed with patient Consults cardiology urology       Nutrition Problem: Inadequate oral intake Etiology: decreased appetite, vomiting, nausea, acute illness     Signs/Symptoms: per patient/family report    Interventions: MVI, Boost Breeze, Ensure Enlive (each supplement provides 350kcal and 20 grams of protein)  Estimated body mass index is 24.52 kg/m as calculated from the following:   Height as of this encounter: 5\' 7"  (1.702 m).   Weight as of this encounter: 71 kg.   Subjective: Urine more bloody today  Objective: Vitals:   01/13/19 0134 01/13/19 0414 01/13/19 0518 01/13/19 1048  BP: 111/67  (!) 121/57 (!) 126/52  Pulse: (!) 50  74 (!) 52  Resp: 18  16 (!) 21  Temp: 98.3 F (36.8 C)  98.9 F (37.2 C) 98.2 F (36.8 C)  TempSrc:      SpO2: 100%  98% 99%  Weight:  71 kg    Height:         Intake/Output Summary (Last 24 hours) at 01/13/2019 1333 Last data filed at 01/13/2019 1200 Gross per 24 hour  Intake 1613.75 ml  Output 1950 ml  Net -336.25 ml   Filed Weights   01/11/19 0316 01/13/19 0414  Weight: 66.8 kg 71 kg    Examination: Foley draining bloody urine General exam: Appears calm and comfortable  Respiratory system: Clear to auscultation. Respiratory effort normal. Cardiovascular system: S1 & S2 heard, RRR. No JVD, murmurs, rubs, gallops or clicks. No pedal edema. Gastrointestinal system: Abdomen is nondistended, soft and nontender. No organomegaly or masses felt. Normal bowel sounds heard. Central nervous system: Alert and oriented. No focal neurological deficits. Extremities: Symmetric 5 x 5 power. Skin: No rashes, lesions or ulcers Psychiatry: Judgement and insight appear normal. Mood & affect appropriate.     Data Reviewed: I have personally reviewed following labs and imaging studies  CBC: Recent Labs  Lab 01/11/19 0008 01/11/19 0047 01/11/19 0318 01/12/19 0351 01/13/19 0356  WBC 13.4*  --  12.8* 10.4 9.9  NEUTROABS 10.0*  --  8.9*  --   --   HGB 15.7 16.0 15.0 11.1* 10.1*  HCT 44.0 47.0 42.6 31.9* 29.2*  MCV 88.0  --  88.8 90.1 90.1  PLT 280  --  261 209 937   Basic Metabolic Panel: Recent Labs  Lab 01/11/19 0008 01/11/19 0047 01/11/19 0318 01/11/19 1230 01/12/19 0351 01/13/19 0356  NA 128* 127* 129* 128* 128* 135  K 3.0* 3.1* 3.1* 3.0* 2.8* 3.4*  CL 89* 94* 92* 93* 98 104  CO2 21*  --  19* 21* 20* 21*  GLUCOSE 141* 138* 141* 151* 148* 128*  BUN 104* 101* 107* 100* 91* 63*  CREATININE 4.48* 4.70* 4.34* 3.75* 2.92* 2.00*  CALCIUM 9.4  --  9.1 8.4* 7.8* 7.7*  MG  --   --  3.5*  --   --   --    GFR: Estimated Creatinine Clearance: 27.5 mL/min (A) (by C-G formula based on SCr of 2 mg/dL (H)). Liver Function Tests: Recent Labs  Lab 01/11/19 0008 01/12/19 0351  AST 36 30  ALT 19 15  ALKPHOS 104 81  BILITOT 1.6* 0.8  PROT 7.9  6.0*  ALBUMIN 4.1 3.0*   No results for input(s): LIPASE, AMYLASE in the last 168 hours. No results for input(s): AMMONIA in the last 168 hours. Coagulation Profile: No results for input(s): INR, PROTIME in the last 168 hours. Cardiac Enzymes: No results for input(s): CKTOTAL, CKMB, CKMBINDEX, TROPONINI in the last 168 hours. BNP (last 3 results) No results for input(s): PROBNP in the last 8760 hours. HbA1C: No results for input(s): HGBA1C in the last 72 hours. CBG: Recent Labs  Lab 01/11/19 0606 01/12/19 0636 01/13/19 0425  GLUCAP 181* 141* 119*   Lipid Profile: No results for input(s): CHOL, HDL, LDLCALC, TRIG, CHOLHDL, LDLDIRECT in the last 72 hours. Thyroid Function Tests: No results for input(s): TSH, T4TOTAL, FREET4, T3FREE, THYROIDAB in the last 72 hours. Anemia Panel: No results for input(s): VITAMINB12, FOLATE, FERRITIN, TIBC, IRON, RETICCTPCT in the last 72 hours. Sepsis Labs: No results for input(s): PROCALCITON, LATICACIDVEN in the last 168 hours.  Recent Results (from the past 240 hour(s))  SARS  CORONAVIRUS 2 (TAT 6-24 HRS) Nasopharyngeal Nasopharyngeal Swab     Status: None   Collection Time: 01/11/19 12:08 AM   Specimen: Nasopharyngeal Swab  Result Value Ref Range Status   SARS Coronavirus 2 NEGATIVE NEGATIVE Final    Comment: (NOTE) SARS-CoV-2 target nucleic acids are NOT DETECTED. The SARS-CoV-2 RNA is generally detectable in upper and lower respiratory specimens during the acute phase of infection. Negative results do not preclude SARS-CoV-2 infection, do not rule out co-infections with other pathogens, and should not be used as the sole basis for treatment or other patient management decisions. Negative results must be combined with clinical observations, patient history, and epidemiological information. The expected result is Negative. Fact Sheet for Patients: SugarRoll[employer]be Fact Sheet for Healthcare Providers:  https://www.woods-[employer]iss[employer]ley.com/ This test is not yet approved or cleared by the [place] FDA and  has been authorized for detection and/or diagnosis of SARS-CoV-2 by FDA under an Emergency Use Authorization (EUA). This EUA will remain  in effect (meaning this test can be used) for the duration of the COVID-19 declaration under Section 56 4(b)(1) of the Act, 21 U.S.C. section 360bbb-3(b)(1), unless the authorization is terminated or revoked sooner. Performed at [facility], [place] [address], [place], [place] [address]   Urine culture     Status: Abnormal   Collection Time: 01/11/19  3:30 AM   Specimen: Urine, Clean Catch  Result Value Ref Range Status   Specimen Description   Final    URINE, CLEAN CATCH Performed at [facility], [place] [address] [place], [place] [address]    Special Requests   Final    NONE Performed at [facility], [place] [address], [place], [place] [address]    Culture >=100,000 COLONIES/mL ESCHERICHIA COLI (A)  Final   Report Status 01/12/2019 FINAL  Final   Organism ID, Bacteria ESCHERICHIA COLI (A)  Final      Susceptibility   Escherichia coli - MIC*    AMPICILLIN >=32 RESISTANT Resistant     CEFAZOLIN <=4 SENSITIVE Sensitive     CEFTRIAXONE <=1 SENSITIVE Sensitive     CIPROFLOXACIN <=0.25 SENSITIVE Sensitive     GENTAMICIN <=1 SENSITIVE Sensitive     IMIPENEM <=0.25 SENSITIVE Sensitive     NITROFURANTOIN <=16 SENSITIVE Sensitive     TRIMETH/SULFA <=20 SENSITIVE Sensitive     AMPICILLIN/SULBACTAM 16 INTERMEDIATE Intermediate     PIP/TAZO <=4 SENSITIVE Sensitive     Extended ESBL NEGATIVE Sensitive     * >=100,000 COLONIES/mL ESCHERICHIA COLI         Radiology Studies: No results found.      Scheduled Meds: . apixaban  2.5 mg Oral BID  . atorvastatin  20 mg Oral q1800  . enzalutamide  80 mg Oral BID  . feeding supplement  1 Container Oral BID BM  . feeding supplement (ENSURE ENLIVE)  237 mL  Oral Q24H  . multivitamin with minerals  1 tablet Oral Daily  . potassium chloride  20 mEq Oral Once  . sodium chloride flush  3 mL Intravenous Q12H  . tamsulosin  0.4 mg Oral QHS   Continuous Infusions:   LOS: 2 days    Georgette Shell, MD [facility]  If 7PM-7AM, please contact night-coverage [email or web address] Password [email or web address] 01/13/2019, 1:33 PM

## 2019-01-14 LAB — CBC
HCT: 30.3 % — ABNORMAL LOW (ref 39.0–52.0)
Hemoglobin: 10.3 g/dL — ABNORMAL LOW (ref 13.0–17.0)
MCH: 32 pg (ref 26.0–34.0)
MCHC: 34 g/dL (ref 30.0–36.0)
MCV: 94.1 fL (ref 80.0–100.0)
Platelets: 207 10*3/uL (ref 150–400)
RBC: 3.22 MIL/uL — ABNORMAL LOW (ref 4.22–5.81)
RDW: 14 % (ref 11.5–15.5)
WBC: 9.2 10*3/uL (ref 4.0–10.5)
nRBC: 0 % (ref 0.0–0.2)

## 2019-01-14 LAB — GLUCOSE, CAPILLARY
Glucose-Capillary: 112 mg/dL — ABNORMAL HIGH (ref 70–99)
Glucose-Capillary: 102 mg/dL — ABNORMAL HIGH (ref 70–99)

## 2019-01-14 MED ORDER — CEPHALEXIN 250 MG PO CAPS
250.0000 mg | ORAL_CAPSULE | Freq: Two times a day (BID) | ORAL | Status: DC
  Administered 2019-01-14: 250 mg via ORAL
  Filled 2019-01-14 (×2): qty 1

## 2019-01-14 MED ORDER — ONDANSETRON HCL 4 MG PO TABS: 4.0000 mg | ORAL_TABLET | Freq: Four times a day (QID) | ORAL | 0 refills | Status: DC | PRN

## 2019-01-14 MED ORDER — METOPROLOL TARTRATE 25 MG PO TABS: 12.5000 mg | ORAL_TABLET | Freq: Every day | ORAL | 0 refills | Status: DC

## 2019-01-14 MED ORDER — CEPHALEXIN 250 MG PO CAPS: 250.0000 mg | ORAL_CAPSULE | Freq: Two times a day (BID) | ORAL | 0 refills | Status: DC

## 2019-01-14 NOTE — Progress Notes (Signed)
Pt. had large BM with bright red blood present. Pt. stated he has Hx. of hemorrhoids however, has not had any issues with them in 2 years.Also, he had blood in his stool back in June of 2020. Pt. was assessed and is stable an.  Oncall notified. Orders were given. Will continue to monitor.

## 2019-01-14 NOTE — Progress Notes (Signed)
Physical Therapy Treatment Patient Details Name: [name] [name] MRN: [id number] DOB: Oct 18, 1935 Today's Date: 01/14/2019    History of Present Illness 83 yr old man admitted with recurrent syncope secondary to severe orthostatic hypotension. PMHx: colostomy, prostate cancer, atrial flutter, Colostomy, dyslipidemia, orthostatic hypotension, sick sinus syndrome/s/p [employer] Jude pacemaker, and thyroid disease.    PT Comments    Pt able to ambulate in hallway with rollator.  Pt with flat affect and refusing SNF. Recommend HHPT.   Follow Up Recommendations  Home health PT;Supervision/Assistance - 24 hour     Equipment Recommendations  (pt issued rollator)    Recommendations for Other Services       Precautions / Restrictions Precautions Precautions: Fall Restrictions Weight Bearing Restrictions: No    Mobility  Bed Mobility Overal bed mobility: Needs Assistance Bed Mobility: Supine to Sit     Supine to sit: Supervision        Transfers Overall transfer level: Needs assistance Equipment used: 4-wheeled walker Transfers: Sit to/from Stand Sit to Stand: Min guard         General transfer comment: instructions given on brakes for rollator  Ambulation/Gait Ambulation/Gait assistance: Min guard Gait Distance (Feet): 95 Feet Assistive device: 4-wheeled walker Gait Pattern/deviations: Step-through pattern;Decreased stride length     General Gait Details: gait sequencing and cadence improved as gait provided   Stairs             Wheelchair Mobility    Modified Rankin (Stroke Patients Only)       Balance Overall balance assessment: History of Falls                                          Cognition Arousal/Alertness: Awake/alert Behavior During Therapy: Flat affect Overall Cognitive Status: Within Functional Limits for tasks assessed                                        Exercises      General Comments General  comments (skin integrity, edema, etc.): Pt with new rollator in room that had been delivered.  He was instructed in its use and safety for transfers      Pertinent Vitals/Pain Pain Assessment: No/denies pain    Home Living                      Prior Function            PT Goals (current goals can now be found in the care plan section) Acute Rehab PT Goals Potential to Achieve Goals: Good Progress towards PT goals: Progressing toward goals    Frequency    Min 3X/week      PT Plan Discharge plan needs to be updated(pt refusing SNF)    Co-evaluation              AM-PAC PT "6 Clicks" Mobility   Outcome Measure  Help needed turning from your back to your side while in a flat bed without using bedrails?: A Little Help needed moving from lying on your back to sitting on the side of a flat bed without using bedrails?: A Little Help needed moving to and from a bed to a chair (including a wheelchair)?: A Little Help needed standing up from a chair using your arms (e.g., wheelchair  or bedside chair)?: A Little Help needed to walk in hospital room?: A Little Help needed climbing 3-5 steps with a railing? : A Little 6 Click Score: 18    End of Session Equipment Utilized During Treatment: Gait belt Activity Tolerance: Patient tolerated treatment well Patient left: in chair;with call [employer]/phone within reach;with chair alarm set;with family/visitor present Nurse Communication: Mobility status PT Visit Diagnosis: Difficulty in walking, not elsewhere classified (R26.2)     Time: 5697-9480 PT Time Calculation (min) (ACUTE ONLY): 17 min  Charges:  $Gait Training: 8-22 mins                     [name] [name], [place] Pager [phone] 01/14/2019    [name] [name] 01/14/2019, 1:17 PM

## 2019-01-14 NOTE — Progress Notes (Signed)
AVS given to patient and explained at the bedside. Medications and follow up appointments have been explained with pt verbalizing understanding. 4 wheel walker provided at discharge.

## 2019-01-14 NOTE — Discharge Summary (Signed)
Physician Discharge Summary  [name] [name] MEQ:[id number] DOB: 07-11-1935 DOA: 01/10/2019  PCP: [name], MD  Admit date: 01/10/2019 Discharge date: 01/14/2019  Admitted From: Home Discharge to home  Recommendations for Outpatient Follow-up:  1. Follow up with PCP in 1-2 weeks 2. Please obtain BMP/CBC in one week 3. Please follow up with Dr. Alessandra Bevels  Home Health yes/patient refused SNF Equipment/Devices: None Discharge Condition stable  CODE STATUS: Full code  diet recommendation: Cardiac Brief/Interim Summary:82 y.o.malewith medical history significant forprostate cancer, chronic kidney disease stage III, paroxysmal atrial fibrillation on Eliquis, and sick sinus syndrome with pacer, now presenting to the emergency department for evaluation of generalized weakness and recurrent syncopal episodes. Patient reports loss of appetite with generalized weakness and lethargy, and has had multiple syncopal episodes this past week.Patient reports insidious development of generalized weakness over the past few days, states that he was having some nausea with a couple episodes of nonbloody vomiting, and has not eaten or drinking much of anything in the past 5 days. He denies any abdominal pain or diarrhea. He denies any fevers, chills, cough, shortness of breath, or chest pain. Reports that the syncopal episodes have occurred while urinating. He gets lightheaded when sitting up or standing. There has not been any chest pain or palpitations associated with this.  ED Course:Upon arrival to the ED, patient is found to be afebrile, saturating well on room air, and with 42 mmHg drop in systolic blood pressure from lying to sitting. EKG features atrial fibrillation. Chest x-ray is negative for acute cardiopulmonary disease. Noncontrast head CT is negative for acute intracranial abnormality. CBC features a leukocytosis to 13,400. Chemistry panel is concerning for sodium of 127, BUN 101, and  creatinine 4.70, up from 2.13 in June 2020. Patient was given a bolus of normal saline, COVID-19 screening test was ordered but not yet resulted, and hospitalists are asked to admit.  Discharge Diagnoses:  Principal Problem:   Orthostatic syncope Active Problems:   Paroxysmal atrial flutter (HCC)   Sick sinus syndrome (HCC)   Carcinoma of prostate (HCC)   Syncope   Acute renal failure superimposed on stage 3 chronic kidney disease (HCC)   Hyponatremia   Hypokalemia   #1 recurrent syncope secondary to severe orthostatic hypotension treated with IV hydration.  Orthostatics resolved after IV hydration.  Patient was able to ambulate with physical therapy.  Patient was seen by physical therapy recommended SNF however patient refused SNF.  #2 AKI with CKD stage III resolved with IV fluids.   #3 prostate cancer onxtandi  #4 paroxysmal atrial fibrillation patient is on low-dose Eliquis and low-dose metoprolol.  His pacer was interrogated during his hospital stay his pacer is functioning normal.  #5 hyponatremia hypovolemic sodium 135 remaining stable  #6 history of sick sinus syndrome status post pacemaker  #7 gross hematuria -secondary to UTI.    #8 e coli uti sensitive to Rocephin patient received 2 doses of Rocephin.  Will DC him on Keflex 250 twice a day for 4 days.   Nutrition Problem: Inadequate oral intake Etiology: decreased appetite, vomiting, nausea, acute illness    Signs/Symptoms: per patient/family report     Interventions: MVI, Boost Breeze, Ensure Enlive (each supplement provides 350kcal and 20 grams of protein)  Estimated body mass index is 24.58 kg/m as calculated from the following:   Height as of this encounter: 5\' 7"  (1.702 m).   Weight as of this encounter: 71.2 kg.  Discharge Instructions  Discharge Instructions    Call MD  for:  difficulty breathing, headache or visual disturbances   Complete by: As directed    Call MD for:  redness,  tenderness, or signs of infection (pain, swelling, redness, odor or green/yellow discharge around incision site)   Complete by: As directed    Call MD for:  severe uncontrolled pain   Complete by: As directed    Call MD for:  temperature >100.4   Complete by: As directed    Diet - low sodium heart healthy   Complete by: As directed    Increase activity slowly   Complete by: As directed      Allergies as of 01/14/2019      Reactions   Xarelto [rivaroxaban] Other (See Comments)   "I didn't like the way it made me feel" (patient didn't elaborate)      Medication List    TAKE these medications   apixaban 2.5 MG Tabs tablet Commonly known as: Eliquis Take 1 tablet (2.5 mg total) by mouth 2 (two) times daily.   atorvastatin 20 MG tablet Commonly known as: LIPITOR TAKE 1 TABLET BY MOUTH DAILY   metoprolol tartrate 25 MG tablet Commonly known as: LOPRESSOR Take 0.5 tablets (12.5 mg total) by mouth daily. What changed:   how much to take  when to take this   multivitamin with minerals Tabs tablet Take 1 tablet by mouth daily.   ondansetron 4 MG tablet Commonly known as: ZOFRAN Take 1 tablet (4 mg total) by mouth every 6 (six) hours as needed for nausea.   tamsulosin 0.4 MG Caps capsule Commonly known as: FLOMAX Take 0.4 mg by mouth at bedtime.   Xtandi 40 MG capsule Generic drug: enzalutamide Take 80 mg by mouth 2 (two) times daily.            Durable Medical Equipment  (From admission, onward)         Start     Ordered   01/13/19 1423  For home use only DME 4 wheeled rolling walker with seat  Once    Question:  Patient needs a walker to treat with the following condition  Answer:  Fear for personal safety   01/13/19 1423         Follow-up Information    [name], MD Follow up.   Specialty: Internal Medicine Contact information: [place] [address] [phone]        Norval Morton, MD .   Specialty: Internal  Medicine       [facility] Follow up.   Contact information: [place] [phone]         Allergies  Allergen Reactions  . Xarelto [Rivaroxaban] Other (See Comments)    "I didn't like the way it made me feel" (patient didn't elaborate)    Consultations:  Dr. Einar Gip and Dr. Alyson Ingles with urology   Procedures/Studies: Ct Head Wo Contrast  Result Date: 01/11/2019 CLINICAL DATA:  Syncope EXAM: CT HEAD WITHOUT CONTRAST TECHNIQUE: Contiguous axial images were obtained from the base of the skull through the vertex without intravenous contrast. COMPARISON:  10/27/2018 FINDINGS: Brain: No acute intracranial abnormality. Specifically, no hemorrhage, hydrocephalus, mass lesion, acute infarction, or significant intracranial injury. Vascular: No hyperdense vessel or unexpected calcification. Skull: No acute calvarial abnormality. Sinuses/Orbits: Visualized paranasal sinuses and mastoids clear. Orbital soft tissues unremarkable. Other: None IMPRESSION: No acute intracranial abnormality. Electronically Signed   By: Rolm Baptise M.D.   On: 01/11/2019 00:14  US Renal  Result Date: 01/11/2019 CLINICAL DATA:  Acute on chronic renal failure EXAM: RENAL / URINARY TRACT ULTRASOUND COMPLETE COMPARISON:  October 27, 2018 FINDINGS: Right Kidney: Renal measurements: 8.7 x 4.5 x 4.7 = volume: 97 mL. There is mildly increased echogenicity throughout. No mass or hydronephrosis visualized. Left Kidney: Renal measurements: 9.3 x 4.6 x 4.6 = volume: 102 mL. There is mildly increased echogenicity throughout. No mass or hydronephrosis visualized. Bladder: There is a partially distended bladder. IMPRESSION: Diffusely increased parenchymal echogenicity, consistent with medical renal disease. No acute hydronephrosis or renal calculi. Electronically Signed   By: Prudencio Pair M.D.   On: 01/11/2019 02:41   Dg Chest Portable 1 View  Result Date: 01/10/2019 CLINICAL  DATA:  Syncope EXAM: PORTABLE CHEST 1 VIEW COMPARISON:  10/27/2018 FINDINGS: Left pacer remains in place, unchanged. Stable hyperinflation of the lungs. Heart and mediastinal contours are within normal limits. No focal opacities or effusions. No acute bony abnormality. IMPRESSION: Stable hyperinflation.  No active cardiopulmonary disease. Electronically Signed   By: Rolm Baptise M.D.   On: 01/10/2019 23:50    (Echo, Carotid, EGD, Colonoscopy, ERCP)    Subjective: This is resting in bed in no acute distress anxious to go home wants to go home refusing to go to rehab.  Discharge Exam: Vitals:   01/14/19 0237 01/14/19 0632  BP: 128/61 (!) 126/45  Pulse: (!) 59 60  Resp: (!) 22   Temp: 99.4 F (37.4 C)   SpO2: 100% 100%   Vitals:   01/14/19 0007 01/14/19 0237 01/14/19 0420 01/14/19 0632  BP: 131/60 128/61  (!) 126/45  Pulse: (!) 50 (!) 59  60  Resp: 20 (!) 22    Temp: 100.1 F (37.8 C) 99.4 F (37.4 C)    TempSrc: Oral Oral    SpO2: 100% 100%  100%  Weight:   71.2 kg   Height:        General: Pt is alert, awake, not in acute distress Cardiovascular: RRR, S1/S2 +, no rubs, no gallops Respiratory: CTA bilaterally, no wheezing, no rhonchi Abdominal: Soft, NT, ND, bowel sounds + Extremities: no edema, no cyanosis    The results of significant diagnostics from this hospitalization (including imaging, microbiology, ancillary and laboratory) are listed below for reference.     Microbiology: Recent Results (from the past 240 hour(s))  SARS CORONAVIRUS 2 (TAT 6-24 HRS) Nasopharyngeal Nasopharyngeal Swab     Status: None   Collection Time: 01/11/19 12:08 AM   Specimen: Nasopharyngeal Swab  Result Value Ref Range Status   SARS Coronavirus 2 NEGATIVE NEGATIVE Final    Comment: (NOTE) SARS-CoV-2 target nucleic acids are NOT DETECTED. The SARS-CoV-2 RNA is generally detectable in upper and lower respiratory specimens during the acute phase of infection. Negative results do not  preclude SARS-CoV-2 infection, do not rule out co-infections with other pathogens, and should not be used as the sole basis for treatment or other patient management decisions. Negative results must be combined with clinical observations, patient history, and epidemiological information. The expected result is Negative. Fact Sheet for Patients: SugarRoll.be Fact Sheet for Healthcare Providers: https://www.woods-mathews.com/ This test is not yet approved or cleared by the [place] FDA and  has been authorized for detection and/or diagnosis of SARS-CoV-2 by FDA under an Emergency Use Authorization (EUA). This EUA will remain  in effect (meaning this test can be used) for the duration of the COVID-19 declaration under Section 56 4(b)(1) of the Act, 21 U.S.C. section 360bbb-3(b)(1), unless the  authorization is terminated or revoked sooner. Performed at [facility], [place] [address], [place], [place] [address]   Urine culture     Status: Abnormal   Collection Time: 01/11/19  3:30 AM   Specimen: Urine, Clean Catch  Result Value Ref Range Status   Specimen Description   Final    URINE, CLEAN CATCH Performed at [facility], [place] [address], [place], [place] [address]    Special Requests   Final    NONE Performed at [facility], [place] [address], [place],  [address]    Culture >=100,000 COLONIES/mL ESCHERICHIA COLI (A)  Final   Report Status 01/12/2019 FINAL  Final   Organism ID, Bacteria ESCHERICHIA COLI (A)  Final      Susceptibility   Escherichia coli - MIC*    AMPICILLIN >=32 RESISTANT Resistant     CEFAZOLIN <=4 SENSITIVE Sensitive     CEFTRIAXONE <=1 SENSITIVE Sensitive     CIPROFLOXACIN <=0.25 SENSITIVE Sensitive     GENTAMICIN <=1 SENSITIVE Sensitive     IMIPENEM <=0.25 SENSITIVE Sensitive     NITROFURANTOIN <=16 SENSITIVE Sensitive     TRIMETH/SULFA <=20 SENSITIVE Sensitive      AMPICILLIN/SULBACTAM 16 INTERMEDIATE Intermediate     PIP/TAZO <=4 SENSITIVE Sensitive     Extended ESBL NEGATIVE Sensitive     * >=100,000 COLONIES/mL ESCHERICHIA COLI     Labs: BNP (last 3 results) No results for input(s): BNP in the last 8760 hours. Basic Metabolic Panel: Recent Labs  Lab 01/11/19 0008 01/11/19 0047 01/11/19 0318 01/11/19 1230 01/12/19 0351 01/13/19 0356  NA 128* 127* 129* 128* 128* 135  K 3.0* 3.1* 3.1* 3.0* 2.8* 3.4*  CL 89* 94* 92* 93* 98 104  CO2 21*  --  19* 21* 20* 21*  GLUCOSE 141* 138* 141* 151* 148* 128*  BUN 104* 101* 107* 100* 91* 63*  CREATININE 4.48* 4.70* 4.34* 3.75* 2.92* 2.00*  CALCIUM 9.4  --  9.1 8.4* 7.8* 7.7*  MG  --   --  3.5*  --   --   --    Liver Function Tests: Recent Labs  Lab 01/11/19 0008 01/12/19 0351  AST 36 30  ALT 19 15  ALKPHOS 104 81  BILITOT 1.6* 0.8  PROT 7.9 6.0*  ALBUMIN 4.1 3.0*   No results for input(s): LIPASE, AMYLASE in the last 168 hours. No results for input(s): AMMONIA in the last 168 hours. CBC: Recent Labs  Lab 01/11/19 0008  01/11/19 0318 01/12/19 0351 01/13/19 0356 01/13/19 2331 01/14/19 0951  WBC 13.4*  --  12.8* 10.4 9.9  --  9.2  NEUTROABS 10.0*  --  8.9*  --   --   --   --   HGB 15.7   < > 15.0 11.1* 10.1* 10.2* 10.3*  HCT 44.0   < > 42.6 31.9* 29.2* 29.5* 30.3*  MCV 88.0  --  88.8 90.1 90.1  --  94.1  PLT 280  --  261 209 211  --  207   < > = values in this interval not displayed.   Cardiac Enzymes: No results for input(s): CKTOTAL, CKMB, CKMBINDEX, TROPONINI in the last 168 hours. BNP: Invalid input(s): POCBNP CBG: Recent Labs  Lab 01/11/19 0606 01/12/19 0636 01/13/19 0425 01/14/19 0413 01/14/19 0749  GLUCAP 181* 141* 119* 112* 102*   D-Dimer No results for input(s): DDIMER in the last 72 hours. Hgb A1c No results for input(s): HGBA1C in the last 72  hours. Lipid Profile No results for input(s): CHOL, HDL, LDLCALC, TRIG, CHOLHDL, LDLDIRECT in the last 72  hours. Thyroid function studies No results for input(s): TSH, T4TOTAL, T3FREE, THYROIDAB in the last 72 hours.  Invalid input(s): FREET3 Anemia work up No results for input(s): VITAMINB12, FOLATE, FERRITIN, TIBC, IRON, RETICCTPCT in the last 72 hours. Urinalysis    Component Value Date/Time   COLORURINE RED (A) 01/11/2019 0330   APPEARANCEUR TURBID (A) 01/11/2019 0330   LABSPEC  01/11/2019 0330    TEST NOT REPORTED DUE TO COLOR INTERFERENCE OF URINE PIGMENT   PHURINE  01/11/2019 0330    TEST NOT REPORTED DUE TO COLOR INTERFERENCE OF URINE PIGMENT   GLUCOSEU (A) 01/11/2019 0330    TEST NOT REPORTED DUE TO COLOR INTERFERENCE OF URINE PIGMENT   HGBUR (A) 01/11/2019 0330    TEST NOT REPORTED DUE TO COLOR INTERFERENCE OF URINE PIGMENT   BILIRUBINUR (A) 01/11/2019 0330    TEST NOT REPORTED DUE TO COLOR INTERFERENCE OF URINE PIGMENT   KETONESUR (A) 01/11/2019 0330    TEST NOT REPORTED DUE TO COLOR INTERFERENCE OF URINE PIGMENT   PROTEINUR (A) 01/11/2019 0330    TEST NOT REPORTED DUE TO COLOR INTERFERENCE OF URINE PIGMENT   UROBILINOGEN 1.0 04/10/2013 0706   NITRITE (A) 01/11/2019 0330    TEST NOT REPORTED DUE TO COLOR INTERFERENCE OF URINE PIGMENT   LEUKOCYTESUR (A) 01/11/2019 0330    TEST NOT REPORTED DUE TO COLOR INTERFERENCE OF URINE PIGMENT   Sepsis Labs Invalid input(s): PROCALCITONIN,  WBC,  LACTICIDVEN Microbiology Recent Results (from the past 240 hour(s))  SARS CORONAVIRUS 2 (TAT 6-24 HRS) Nasopharyngeal Nasopharyngeal Swab     Status: None   Collection Time: 01/11/19 12:08 AM   Specimen: Nasopharyngeal Swab  Result Value Ref Range Status   SARS Coronavirus 2 NEGATIVE NEGATIVE Final    Comment: (NOTE) SARS-CoV-2 target nucleic acids are NOT DETECTED. The SARS-CoV-2 RNA is generally detectable in upper and lower respiratory specimens during the acute phase of infection. Negative results do not preclude SARS-CoV-2 infection, do not rule out co-infections with other  pathogens, and should not be used as the sole basis for treatment or other patient management decisions. Negative results must be combined with clinical observations, patient history, and epidemiological information. The expected result is Negative. Fact Sheet for Patients: SugarRoll.be Fact Sheet for Healthcare Providers: https://www.woods-mathews.com/ This test is not yet approved or cleared by the [place] FDA and  has been authorized for detection and/or diagnosis of SARS-CoV-2 by FDA under an Emergency Use Authorization (EUA). This EUA will remain  in effect (meaning this test can be used) for the duration of the COVID-19 declaration under Section 56 4(b)(1) of the Act, 21 U.S.C. section 360bbb-3(b)(1), unless the authorization is terminated or revoked sooner. Performed at [facility], [place] [address], [place], [place] [address]   Urine culture     Status: Abnormal   Collection Time: 01/11/19  3:30 AM   Specimen: Urine, Clean Catch  Result Value Ref Range Status   Specimen Description   Final    URINE, CLEAN CATCH Performed at [facility], [place] [address], [place], [place] [address]    Special Requests   Final    NONE Performed at [facility], [place] [address], [place], [place] [address]    Culture >=100,000 COLONIES/mL ESCHERICHIA COLI (A)  Final   Report Status 01/12/2019 FINAL  Final   Organism ID, Bacteria ESCHERICHIA COLI (A)  Final      Susceptibility   Escherichia coli - MIC*    AMPICILLIN >=32 RESISTANT Resistant     CEFAZOLIN <=4 SENSITIVE Sensitive     CEFTRIAXONE <=1 SENSITIVE Sensitive     CIPROFLOXACIN <=0.25 SENSITIVE Sensitive     GENTAMICIN <=1 SENSITIVE Sensitive     IMIPENEM <=0.25 SENSITIVE Sensitive     NITROFURANTOIN <=16 SENSITIVE Sensitive     TRIMETH/SULFA <=20 SENSITIVE Sensitive     AMPICILLIN/SULBACTAM 16 INTERMEDIATE Intermediate     PIP/TAZO <=4 SENSITIVE  Sensitive     Extended ESBL NEGATIVE Sensitive     * >=100,000 COLONIES/mL ESCHERICHIA COLI     Time coordinating discharge: 35 minutes  SIGNED:   Georgette Shell, MD  [facility] 01/14/2019, 11:36 AM Pager   If 7PM-7AM, please contact night-coverage [email or web address] Password [email or web address]

## 2019-01-14 NOTE — Progress Notes (Signed)
   01/13/19 2204  Vitals  Temp 100.2 F (37.9 C)  Temp Source Oral  BP (!) 119/55  MAP (mmHg) 73  BP Location Right Arm  BP Method Automatic  Patient Position (if appropriate) Lying  Pulse Rate (!) 50  Resp (!) 24  Oxygen Therapy  SpO2 99 %  O2 Device Room Air  Complaints & Interventions  Complains of Fever  Interventions Medication (see MAR)  MEWS Score  MEWS RR 1  MEWS Pulse 1  MEWS Systolic 0  MEWS LOC 0  MEWS Temp 0  MEWS Score 2  MEWS Score Color Yellow  MEWS Assessment  Is this an acute change? Yes  MEWS guidelines implemented *See Row Information* Yellow

## 2019-01-14 NOTE — Care Management Important Message (Signed)
Important Message  Patient Details IM Letter given to Cookie McGibboney RN to present to the Patient Name: [name] [name] MRN: [id number] Date of Birth: 10/29/1935   Medicare Important Message Given:  Yes     Kerin Salen 01/14/2019, 11:06 AM

## 2019-01-17 ENCOUNTER — Ambulatory Visit: Payer: Medicare Other | Admitting: Cardiology

## 2019-02-01 DIAGNOSIS — Z45018 Encounter for adjustment and management of other part of cardiac pacemaker: Secondary | ICD-10-CM | POA: Diagnosis not present

## 2019-02-01 DIAGNOSIS — Z950 Presence of cardiac pacemaker: Secondary | ICD-10-CM | POA: Diagnosis not present

## 2019-02-01 DIAGNOSIS — I480 Paroxysmal atrial fibrillation: Secondary | ICD-10-CM

## 2019-02-08 ENCOUNTER — Encounter: Payer: Self-pay | Admitting: Cardiology

## 2019-02-08 ENCOUNTER — Telehealth: Payer: Self-pay

## 2019-02-08 NOTE — Telephone Encounter (Signed)
Pt aware of transmission  

## 2019-02-08 NOTE — Telephone Encounter (Signed)
-----   Message from Adrian Prows, MD sent at 02/08/2019  5:21 AM EDT ----- Regarding: Pacemaker Normal function.

## 2019-02-28 ENCOUNTER — Inpatient Hospital Stay (HOSPITAL_COMMUNITY)
Admission: EM | Admit: 2019-02-28 | Discharge: 2019-03-03 | DRG: 683 | Disposition: A | Discharge status: Home / Self Care | Payer: Medicare Other | Attending: Internal Medicine | Admitting: Internal Medicine

## 2019-02-28 ENCOUNTER — Emergency Department (HOSPITAL_COMMUNITY): Payer: Medicare Other

## 2019-02-28 ENCOUNTER — Other Ambulatory Visit: Payer: Self-pay

## 2019-02-28 DIAGNOSIS — I495 Sick sinus syndrome: Secondary | ICD-10-CM | POA: Diagnosis present

## 2019-02-28 DIAGNOSIS — E872 Acidosis, unspecified: Secondary | ICD-10-CM

## 2019-02-28 DIAGNOSIS — Z806 Family history of leukemia: Secondary | ICD-10-CM

## 2019-02-28 DIAGNOSIS — N183 Chronic kidney disease, stage 3 unspecified: Secondary | ICD-10-CM | POA: Diagnosis present

## 2019-02-28 DIAGNOSIS — R7989 Other specified abnormal findings of blood chemistry: Secondary | ICD-10-CM

## 2019-02-28 DIAGNOSIS — I129 Hypertensive chronic kidney disease with stage 1 through stage 4 chronic kidney disease, or unspecified chronic kidney disease: Secondary | ICD-10-CM | POA: Diagnosis present

## 2019-02-28 DIAGNOSIS — Z950 Presence of cardiac pacemaker: Secondary | ICD-10-CM

## 2019-02-28 DIAGNOSIS — N390 Urinary tract infection, site not specified: Secondary | ICD-10-CM | POA: Diagnosis present

## 2019-02-28 DIAGNOSIS — R61 Generalized hyperhidrosis: Secondary | ICD-10-CM | POA: Diagnosis present

## 2019-02-28 DIAGNOSIS — E785 Hyperlipidemia, unspecified: Secondary | ICD-10-CM | POA: Diagnosis present

## 2019-02-28 DIAGNOSIS — I4892 Unspecified atrial flutter: Secondary | ICD-10-CM | POA: Diagnosis present

## 2019-02-28 DIAGNOSIS — E860 Dehydration: Secondary | ICD-10-CM | POA: Diagnosis present

## 2019-02-28 DIAGNOSIS — I480 Paroxysmal atrial fibrillation: Secondary | ICD-10-CM | POA: Diagnosis present

## 2019-02-28 DIAGNOSIS — Z923 Personal history of irradiation: Secondary | ICD-10-CM

## 2019-02-28 DIAGNOSIS — Z79899 Other long term (current) drug therapy: Secondary | ICD-10-CM

## 2019-02-28 DIAGNOSIS — C61 Malignant neoplasm of prostate: Secondary | ICD-10-CM | POA: Diagnosis present

## 2019-02-28 DIAGNOSIS — Z7901 Long term (current) use of anticoagulants: Secondary | ICD-10-CM

## 2019-02-28 DIAGNOSIS — N179 Acute kidney failure, unspecified: Principal | ICD-10-CM

## 2019-02-28 DIAGNOSIS — Z20828 Contact with and (suspected) exposure to other viral communicable diseases: Secondary | ICD-10-CM | POA: Diagnosis present

## 2019-02-28 DIAGNOSIS — I10 Essential (primary) hypertension: Secondary | ICD-10-CM | POA: Diagnosis present

## 2019-02-28 DIAGNOSIS — Z87891 Personal history of nicotine dependence: Secondary | ICD-10-CM

## 2019-02-28 LAB — CBC WITH DIFFERENTIAL/PLATELET
Abs Immature Granulocytes: 0.13 10*3/uL — ABNORMAL HIGH (ref 0.00–0.07)
Basophils Absolute: 0 10*3/uL (ref 0.0–0.1)
Basophils Relative: 0 %
Eosinophils Absolute: 0 10*3/uL (ref 0.0–0.5)
Eosinophils Relative: 0 %
HCT: 50.7 % (ref 39.0–52.0)
Hemoglobin: 18 g/dL — ABNORMAL HIGH (ref 13.0–17.0)
Immature Granulocytes: 1 %
Lymphocytes Relative: 7 %
Lymphs Abs: 1.2 10*3/uL (ref 0.7–4.0)
MCH: 32 pg (ref 26.0–34.0)
MCHC: 35.5 g/dL (ref 30.0–36.0)
MCV: 90.1 fL (ref 80.0–100.0)
Monocytes Absolute: 1.3 10*3/uL — ABNORMAL HIGH (ref 0.1–1.0)
Monocytes Relative: 8 %
Neutro Abs: 14.6 10*3/uL — ABNORMAL HIGH (ref 1.7–7.7)
Neutrophils Relative %: 84 %
Platelets: 332 10*3/uL (ref 150–400)
RBC: 5.63 MIL/uL (ref 4.22–5.81)
RDW: 13.5 % (ref 11.5–15.5)
WBC: 17.3 10*3/uL — ABNORMAL HIGH (ref 4.0–10.5)
nRBC: 0 % (ref 0.0–0.2)

## 2019-02-28 LAB — TROPONIN I (HIGH SENSITIVITY): Troponin I (High Sensitivity): 81 ng/L — ABNORMAL HIGH (ref <18)

## 2019-02-28 LAB — LACTIC ACID, PLASMA: Lactic Acid, Venous: 4.8 mmol/L (ref 0.5–1.9)

## 2019-02-28 MED ORDER — SODIUM CHLORIDE 0.9 % IV BOLUS (SEPSIS)
200.0000 mL | Freq: Once | INTRAVENOUS | Status: AC
  Administered 2019-03-01: 200 mL via INTRAVENOUS

## 2019-02-28 MED ORDER — SODIUM CHLORIDE 0.9 % IV BOLUS (SEPSIS)
1000.0000 mL | Freq: Once | INTRAVENOUS | Status: AC
  Administered 2019-02-28: 1000 mL via INTRAVENOUS

## 2019-02-28 MED ORDER — VANCOMYCIN HCL 10 G IV SOLR
1500.0000 mg | Freq: Once | INTRAVENOUS | Status: DC
  Filled 2019-02-28: qty 1500

## 2019-02-28 MED ORDER — PIPERACILLIN-TAZOBACTAM 3.375 G IVPB 30 MIN: 3.3750 g | Freq: Once | INTRAVENOUS | Status: DC

## 2019-02-28 NOTE — ED Provider Notes (Signed)
[name] DEPT Provider Note   CSN: [id number] Arrival date & time: 02/28/19  1849     History   Chief Complaint Chief Complaint  Patient presents with  . Weakness    HPI [name] [name] is a 83 y.o. male.     83 year old.male who lives at home alone with medical history significant for prostate cancer taking oral chemo, chronic kidney disease stage III, paroxysmal atrial fibrillation on Eliquis, and sick sinus syndrome with pacer, now presenting to the emergency department for evaluation of generalized weakness and recurrent syncopal episodes.  Patient reports loss of appetite with generalized weakness and lethargy, and has had multiple syncopal episodes since last Thursday. Has had admissions in the past for the same.  Patient also having some nausea with a couple episodes of nonbloody vomiting, decreased urination and anorexia for 3 days.  He denies any abdominal pain or diarrhea.  He denies any fevers, chills, cough, shortness of breath, or chest pain.  Reports that the syncopal episodes have occurred while standing up to go to the bathroom. He gets lightheaded when sitting up or standing.  There has not been any chest pain or palpitations associated with this.     Past Medical History:  Diagnosis Date  . AKI (acute kidney injury) ([place]) 06/08/2018  . Atrial flutter ([place])   . Carcinoma of prostate ([place]) 12/30/2011   Treated with seed/radiation therapy.   . Colostomy in place [facility])   . Difficulty sleeping    lives in shelter currently  . Diverticulitis   . Dyslipidemia 12/30/2011  . Encounter for care of pacemaker 06/17/2018  . Frequency of urination   . Heart murmur   . Hypertension 04/30/11   Cardioversion 06/16/11  . Near syncope 06/07/2018  . Nocturia   . Orthostatic hypotension 06/08/2018  . Pacemaker 7/13    sick sinus syndrome/St Jude pacemaker  . Paroxysmal atrial flutter ([place]) 06/16/2011  . Prostate cancer ([place])   . Sick sinus syndrome  ([place]) 11/28/2011  . Syncope and collapse 11/20/2011   Atrial and ventricular standstill > 3 seconds.   . Thyroid disease    had "overactive thyroid in 1997" - no known problem since    Patient Active Problem List   Diagnosis Date Noted  . Orthostatic syncope 01/11/2019  . Acute renal failure superimposed on stage 3 chronic kidney disease ([place]) 01/11/2019  . Hyponatremia 01/11/2019  . Hypokalemia 01/11/2019  . Syncope 10/27/2018  . Encounter for care of pacemaker 06/17/2018  . Diverticular disease 05/13/2013  . Renal insufficiency 12/31/2011  . Dyslipidemia 12/30/2011  . Carcinoma of prostate ([employer]) 12/30/2011  . Pacemaker-[employer].Jude Accent DR-RF dual chamber pacemaker  12/03/2011  . Sick sinus syndrome ([place]) 11/28/2011  . Paroxysmal atrial flutter ([place]) 06/16/2011  . Essential hypertension, benign 06/16/2011    Past Surgical History:  Procedure Laterality Date  . ATRIAL FLUTTER ABLATION N/A 11/21/2011   Procedure: ATRIAL FLUTTER ABLATION;  Surgeon: Thompson Grayer, MD;  Location: [facility] CATH LAB;  Service: Cardiovascular;  Laterality: N/A;  . CARDIAC ELECTROPHYSIOLOGY STUDY AND ABLATION  7/13  . CARDIOVERSION  06/16/2011   Procedure: CARDIOVERSION;  Surgeon: Laverda Page, MD;  Location: [facility];  Service: Cardiovascular;  Laterality: N/A;  . COLON SURGERY  05/13/2013  . COLOSTOMY CLOSURE N/A 11/25/2013   Procedure: COLOSTOMY CLOSURE;  Surgeon: Earnstine Regal, MD;  Location: [facility] ORS;  Service: General;  Laterality: N/A;  . COLOSTOMY CLOSURE  11/25/2013  . PACEMAKER INSERTION  11/28/11  SJM Accent DR RF implanted by Dr Rayann Heman  . PARTIAL COLECTOMY N/A 05/13/2013   Procedure: sigmoid COLECTOMY  colostomy ;  Surgeon: Earnstine Regal, MD;  Location: [facility] ORS;  Service: General;  Laterality: N/A;  . PERMANENT PACEMAKER INSERTION N/A 11/28/2011   Procedure: PERMANENT PACEMAKER INSERTION;  Surgeon: Thompson Grayer, MD;  Location: [facility] CATH LAB;  Service: Cardiovascular;  Laterality: N/A;  . RADIOACTIVE SEED  IMPLANT    . TONSILLECTOMY          Home Medications    Prior to Admission medications   Medication Sig Start Date End Date Taking? Authorizing Provider  apixaban (ELIQUIS) 2.5 MG TABS tablet Take 1 tablet (2.5 mg total) by mouth 2 (two) times daily. 10/29/18   Mercy Riding, MD  atorvastatin (LIPITOR) 20 MG tablet TAKE 1 TABLET BY MOUTH DAILY Patient taking differently: Take 20 mg by mouth daily.  01/03/19   Adrian Prows, MD  cephALEXin (KEFLEX) 250 MG capsule Take 1 capsule (250 mg total) by mouth every 12 (twelve) hours. 01/14/19   Georgette Shell, MD  metoprolol tartrate (LOPRESSOR) 25 MG tablet Take 0.5 tablets (12.5 mg total) by mouth daily. 01/14/19   Georgette Shell, MD  Multiple Vitamin (MULTIVITAMIN WITH MINERALS) TABS tablet Take 1 tablet by mouth daily. 10/29/18   Mercy Riding, MD  ondansetron (ZOFRAN) 4 MG tablet Take 1 tablet (4 mg total) by mouth every 6 (six) hours as needed for nausea. 01/14/19   Georgette Shell, MD  Tamsulosin HCl (FLOMAX) 0.4 MG CAPS Take 0.4 mg by mouth at bedtime.     [provider]  XTANDI 40 MG capsule Take 80 mg by mouth 2 (two) times daily.  05/26/18   [provider]    Family History Family History  Problem Relation Age of Onset  . Leukemia Mother   . Dementia Father     Social History Social History   Tobacco Use  . Smoking status: Former Smoker    Packs/day: 0.50    Years: 50.00    Pack years: 25.00    Types: Cigarettes    Quit date: 07/20/2009    Years since quitting: 9.6  . Smokeless tobacco: Never Used  Substance Use Topics  . Alcohol use: No  . Drug use: No     Allergies   Xarelto [rivaroxaban]   Review of Systems Review of Systems  Constitutional: Positive for appetite change, chills and fatigue. Negative for activity change and diaphoresis.  HENT: Negative for congestion, sinus pain and sore throat.   Eyes: Negative for visual disturbance.  Respiratory: Negative for apnea, cough, chest  tightness and shortness of breath.   Cardiovascular: Negative for chest pain, palpitations and leg swelling.  Gastrointestinal: Positive for nausea. Negative for abdominal distention, abdominal pain, anal bleeding, blood in stool, constipation, diarrhea and rectal pain.  Genitourinary: Positive for decreased urine volume and urgency. Negative for difficulty urinating and dysuria.  Musculoskeletal: Negative for arthralgias, back pain, neck pain and neck stiffness.  Skin: Negative for rash and wound.  Neurological: Positive for syncope, weakness (generalized) and light-headedness. Negative for dizziness, seizures, speech difficulty and headaches.     Physical Exam Updated Vital Signs Ht 5\' 7"  (1.702 m)   BMI 24.58 kg/m   Physical Exam Vitals signs and nursing note reviewed.  Constitutional:      General: He is not in acute distress.    Appearance: Normal appearance. He is not toxic-appearing or diaphoretic.     Comments:  Chronically ill apearing  HENT:     Head: Normocephalic.     Mouth/Throat:     Mouth: Mucous membranes are dry.  Eyes:     Conjunctiva/sclera: Conjunctivae normal.  Neck:     Musculoskeletal: Full passive range of motion without pain. No spinous process tenderness or muscular tenderness.  Cardiovascular:     Rate and Rhythm: Normal rate and regular rhythm.     Pulses: Normal pulses.  Pulmonary:     Effort: Pulmonary effort is normal.     Breath sounds: Normal breath sounds.  Abdominal:     General: Abdomen is flat. Bowel sounds are normal. There is no distension.     Tenderness: There is no abdominal tenderness. There is no guarding or rebound.  Skin:    General: Skin is warm and dry.     Capillary Refill: Capillary refill takes less than 2 seconds.  Neurological:     General: No focal deficit present.     Mental Status: He is alert and oriented to person, place, and time.  Psychiatric:        Mood and Affect: Mood normal.      ED Treatments / Results   Labs (all labs ordered are listed, but only abnormal results are displayed) Labs Reviewed  COMPREHENSIVE METABOLIC PANEL  CBC WITH DIFFERENTIAL/PLATELET  LACTIC ACID, PLASMA  LACTIC ACID, PLASMA  URINALYSIS, ROUTINE W REFLEX MICROSCOPIC    EKG None  Radiology No results found.  Procedures Procedures (including critical care time)  Medications Ordered in ED Medications  sodium chloride 0.9 % bolus 1,000 mL (has no administration in time range)     Initial Impression / Assessment and Plan / ED Course  I have reviewed the triage vital signs and the nursing notes.  Pertinent labs & imaging results that were available during my care of the patient were reviewed by me and considered in my medical decision making (see chart for details).  Clinical Course as of Oct 27 0001  Mon Feb 28, 2019  2057 Patient initially being assessed for generalized weakness and multiple syncopal episodes.  History of the same in the past and was admitted multiple times for severe dehydration.  After evaluating the patient, the nurse advised me that the patient was experiencing chest pain.  I went to reevaluate the patient.  He reports that he had a couple of minutes of aching pain in his left upper quadrant and left lower ribs.  Reports that it hurt with movement.  Denies any current pain at this time.   [KM]  2212 Waiting on IV team to draw labs. Patient remains without chest pain. Still feeling generalized weakness.    [KM]  2334 Patient labs reveal a white count of 17.3 with lactic acid elevated to 4.8. Code sepsis initiated. Broad spectrum antibiotics ordered as well as remaining amount of fluids to meet sepsis treatment requirements. Unknown source of infection at this time. No complaints from patient at this time. Trop noted to be 80. Patient has chronically elevated trop. No chest pain at this time. Patient is hemodynamically stable.   [KM]  2354 Care signed out to [facility] due to change of  shift    [KM]    Clinical Course User Index [KM] Alveria Apley, PA-C       CRITICAL CARE Performed by: Alveria Apley   Total critical care time: 30 minutes  Critical care time was exclusive of separately billable procedures and treating other patients.  Critical  care was necessary to treat or prevent imminent or life-threatening deterioration.  Critical care was time spent personally by me on the following activities: development of treatment plan with patient and/or surrogate as well as nursing, discussions with consultants, evaluation of patient's response to treatment, examination of patient, obtaining history from patient or surrogate, ordering and performing treatments and interventions, ordering and review of laboratory studies, ordering and review of radiographic studies, pulse oximetry and re-evaluation of patient's condition.   Final Clinical Impressions(s) / ED Diagnoses   Final diagnoses:  None    ED Discharge Orders    None       Kristine Royal 03/01/19 0002    Dorie Rank, MD 03/01/19 1724

## 2019-02-28 NOTE — ED Notes (Signed)
PA at bedside.

## 2019-02-28 NOTE — ED Triage Notes (Addendum)
Patient BIB EMS from home with hx prostate cancer for "a couple years." Patient c/o generalized weakness, decreased appetite for the past week. Denies cough/fever. Pt take Oral Chemo Twice a day.    EMS: 106 Palpate, CBG=212, HR 70s, O2 sat 95% RA

## 2019-02-28 NOTE — ED Notes (Signed)
Attempted IV access x3, unsuccessful. IV team consulted.

## 2019-02-28 NOTE — ED Notes (Addendum)
Date and time results received: 02/28/19 11:30 PM  (use smartphrase ".now" to insert current time)  Test: lactic Critical Value: 4.8  Name of Provider Notified: Tomi Bamberger  Orders Received? Or Actions Taken?:

## 2019-03-01 ENCOUNTER — Inpatient Hospital Stay (HOSPITAL_COMMUNITY): Payer: Medicare Other

## 2019-03-01 ENCOUNTER — Encounter (HOSPITAL_COMMUNITY): Payer: Self-pay

## 2019-03-01 DIAGNOSIS — N179 Acute kidney failure, unspecified: Principal | ICD-10-CM | POA: Diagnosis present

## 2019-03-01 DIAGNOSIS — R61 Generalized hyperhidrosis: Secondary | ICD-10-CM | POA: Diagnosis present

## 2019-03-01 DIAGNOSIS — I480 Paroxysmal atrial fibrillation: Secondary | ICD-10-CM | POA: Diagnosis present

## 2019-03-01 DIAGNOSIS — E860 Dehydration: Secondary | ICD-10-CM | POA: Diagnosis present

## 2019-03-01 DIAGNOSIS — Z950 Presence of cardiac pacemaker: Secondary | ICD-10-CM | POA: Diagnosis not present

## 2019-03-01 DIAGNOSIS — I4892 Unspecified atrial flutter: Secondary | ICD-10-CM | POA: Diagnosis present

## 2019-03-01 DIAGNOSIS — C61 Malignant neoplasm of prostate: Secondary | ICD-10-CM | POA: Diagnosis present

## 2019-03-01 DIAGNOSIS — I129 Hypertensive chronic kidney disease with stage 1 through stage 4 chronic kidney disease, or unspecified chronic kidney disease: Secondary | ICD-10-CM | POA: Diagnosis present

## 2019-03-01 DIAGNOSIS — I495 Sick sinus syndrome: Secondary | ICD-10-CM | POA: Diagnosis present

## 2019-03-01 DIAGNOSIS — R7989 Other specified abnormal findings of blood chemistry: Secondary | ICD-10-CM

## 2019-03-01 DIAGNOSIS — Z20828 Contact with and (suspected) exposure to other viral communicable diseases: Secondary | ICD-10-CM | POA: Diagnosis present

## 2019-03-01 DIAGNOSIS — I10 Essential (primary) hypertension: Secondary | ICD-10-CM | POA: Diagnosis not present

## 2019-03-01 DIAGNOSIS — Z79899 Other long term (current) drug therapy: Secondary | ICD-10-CM | POA: Diagnosis not present

## 2019-03-01 DIAGNOSIS — N183 Chronic kidney disease, stage 3 unspecified: Secondary | ICD-10-CM

## 2019-03-01 DIAGNOSIS — E785 Hyperlipidemia, unspecified: Secondary | ICD-10-CM | POA: Diagnosis present

## 2019-03-01 DIAGNOSIS — Z7901 Long term (current) use of anticoagulants: Secondary | ICD-10-CM | POA: Diagnosis not present

## 2019-03-01 DIAGNOSIS — E872 Acidosis: Secondary | ICD-10-CM | POA: Diagnosis present

## 2019-03-01 DIAGNOSIS — N390 Urinary tract infection, site not specified: Secondary | ICD-10-CM | POA: Diagnosis present

## 2019-03-01 DIAGNOSIS — Z806 Family history of leukemia: Secondary | ICD-10-CM | POA: Diagnosis not present

## 2019-03-01 DIAGNOSIS — Z87891 Personal history of nicotine dependence: Secondary | ICD-10-CM | POA: Diagnosis not present

## 2019-03-01 DIAGNOSIS — Z923 Personal history of irradiation: Secondary | ICD-10-CM | POA: Diagnosis not present

## 2019-03-01 LAB — BASIC METABOLIC PANEL
Anion gap: 16 — ABNORMAL HIGH (ref 5–15)
BUN: 90 mg/dL — ABNORMAL HIGH (ref 8–23)
CO2: 23 mmol/L (ref 22–32)
Calcium: 6.6 mg/dL — ABNORMAL LOW (ref 8.9–10.3)
Chloride: 90 mmol/L — ABNORMAL LOW (ref 98–111)
Creatinine, Ser: 4.36 mg/dL — ABNORMAL HIGH (ref 0.61–1.24)
GFR calc Af Amer: 14 mL/min — ABNORMAL LOW (ref >60)
GFR calc non Af Amer: 12 mL/min — ABNORMAL LOW (ref >60)
Glucose, Bld: 328 mg/dL — ABNORMAL HIGH (ref 70–99)
Potassium: 3 mmol/L — ABNORMAL LOW (ref 3.5–5.1)
Sodium: 129 mmol/L — ABNORMAL LOW (ref 135–145)

## 2019-03-01 LAB — COMPREHENSIVE METABOLIC PANEL
ALT: 15 U/L (ref 0–44)
AST: 33 U/L (ref 15–41)
Albumin: 4.7 g/dL (ref 3.5–5.0)
Alkaline Phosphatase: 123 U/L (ref 38–126)
Anion gap: 27 — ABNORMAL HIGH (ref 5–15)
BUN: 102 mg/dL — ABNORMAL HIGH (ref 8–23)
CO2: 14 mmol/L — ABNORMAL LOW (ref 22–32)
Calcium: 9.2 mg/dL (ref 8.9–10.3)
Chloride: 87 mmol/L — ABNORMAL LOW (ref 98–111)
Creatinine, Ser: 7.16 mg/dL — ABNORMAL HIGH (ref 0.61–1.24)
GFR calc Af Amer: 8 mL/min — ABNORMAL LOW (ref >60)
GFR calc non Af Amer: 7 mL/min — ABNORMAL LOW (ref >60)
Glucose, Bld: 179 mg/dL — ABNORMAL HIGH (ref 70–99)
Potassium: 4 mmol/L (ref 3.5–5.1)
Sodium: 128 mmol/L — ABNORMAL LOW (ref 135–145)
Total Bilirubin: 0.9 mg/dL (ref 0.3–1.2)
Total Protein: 9.4 g/dL — ABNORMAL HIGH (ref 6.5–8.1)

## 2019-03-01 LAB — PROCALCITONIN: Procalcitonin: 0.16 ng/mL

## 2019-03-01 LAB — CBG MONITORING, ED
Glucose-Capillary: 154 mg/dL — ABNORMAL HIGH (ref 70–99)
Glucose-Capillary: 190 mg/dL — ABNORMAL HIGH (ref 70–99)
Glucose-Capillary: 146 mg/dL — ABNORMAL HIGH (ref 70–99)
Glucose-Capillary: 203 mg/dL — ABNORMAL HIGH (ref 70–99)

## 2019-03-01 LAB — URINALYSIS, ROUTINE W REFLEX MICROSCOPIC
Bilirubin Urine: NEGATIVE
Glucose, UA: NEGATIVE mg/dL
Ketones, ur: NEGATIVE mg/dL
Nitrite: NEGATIVE
Protein, ur: 100 mg/dL — AB
RBC / HPF: >50 RBC/hpf — ABNORMAL HIGH (ref 0–5)
Specific Gravity, Urine: 1.012 (ref 1.005–1.030)
WBC, UA: >50 WBC/hpf — ABNORMAL HIGH (ref 0–5)
pH: 5 (ref 5.0–8.0)

## 2019-03-01 LAB — PROTIME-INR
INR: 1.2 (ref 0.8–1.2)
Prothrombin Time: 15 seconds (ref 11.4–15.2)

## 2019-03-01 LAB — LACTIC ACID, PLASMA
Lactic Acid, Venous: 4.8 mmol/L (ref 0.5–1.9)
Lactic Acid, Venous: 2.2 mmol/L (ref 0.5–1.9)
Lactic Acid, Venous: 4.8 mmol/L (ref 0.5–1.9)

## 2019-03-01 LAB — HEMOGLOBIN A1C
Hgb A1c MFr Bld: 6.5 % — ABNORMAL HIGH (ref 4.8–5.6)
Mean Plasma Glucose: 139.85 mg/dL

## 2019-03-01 LAB — GLUCOSE, CAPILLARY
Glucose-Capillary: 131 mg/dL — ABNORMAL HIGH (ref 70–99)
Glucose-Capillary: 144 mg/dL — ABNORMAL HIGH (ref 70–99)

## 2019-03-01 LAB — TROPONIN I (HIGH SENSITIVITY): Troponin I (High Sensitivity): 98 ng/L — ABNORMAL HIGH (ref <18)

## 2019-03-01 LAB — SARS CORONAVIRUS 2 (TAT 6-24 HRS): SARS Coronavirus 2: NEGATIVE

## 2019-03-01 LAB — APTT: aPTT: 27 seconds (ref 24–36)

## 2019-03-01 MED ORDER — ATORVASTATIN CALCIUM 20 MG PO TABS
20.0000 mg | ORAL_TABLET | Freq: Every day | ORAL | Status: DC
  Administered 2019-03-01 – 2019-03-02 (×2): 20 mg via ORAL
  Filled 2019-03-01 (×3): qty 1

## 2019-03-01 MED ORDER — ACETAMINOPHEN 325 MG PO TABS: 650.0000 mg | ORAL_TABLET | Freq: Four times a day (QID) | ORAL | Status: DC | PRN

## 2019-03-01 MED ORDER — ACETAMINOPHEN 650 MG RE SUPP: 650.0000 mg | Freq: Four times a day (QID) | RECTAL | Status: DC | PRN

## 2019-03-01 MED ORDER — IOHEXOL 300 MG/ML  SOLN
30.0000 mL | Freq: Once | INTRAMUSCULAR | Status: AC | PRN
  Administered 2019-03-01: 30 mL via ORAL

## 2019-03-01 MED ORDER — STERILE WATER FOR INJECTION IV SOLN
INTRAVENOUS | Status: DC
  Filled 2019-03-01: qty 850

## 2019-03-01 MED ORDER — METOPROLOL TARTRATE 25 MG PO TABS
12.5000 mg | ORAL_TABLET | Freq: Every day | ORAL | Status: DC
  Administered 2019-03-01 – 2019-03-03 (×3): 12.5 mg via ORAL
  Filled 2019-03-01 (×3): qty 1

## 2019-03-01 MED ORDER — SODIUM BICARBONATE-DEXTROSE 150-5 MEQ/L-% IV SOLN
150.0000 meq | INTRAVENOUS | Status: DC
  Administered 2019-03-01 – 2019-03-02 (×4): 150 meq via INTRAVENOUS
  Filled 2019-03-01 (×6): qty 1000

## 2019-03-01 MED ORDER — TAMSULOSIN HCL 0.4 MG PO CAPS
0.4000 mg | ORAL_CAPSULE | Freq: Every day | ORAL | Status: DC
  Administered 2019-03-01 – 2019-03-02 (×3): 0.4 mg via ORAL
  Filled 2019-03-01 (×3): qty 1

## 2019-03-01 MED ORDER — SODIUM CHLORIDE 0.9 % IV SOLN
1.0000 g | INTRAVENOUS | Status: DC
  Administered 2019-03-01 – 2019-03-02 (×2): 1 g via INTRAVENOUS
  Filled 2019-03-01: qty 1
  Filled 2019-03-01: qty 10
  Filled 2019-03-01: qty 1

## 2019-03-01 MED ORDER — ADULT MULTIVITAMIN W/MINERALS CH
1.0000 | ORAL_TABLET | Freq: Every day | ORAL | Status: DC
  Administered 2019-03-01 – 2019-03-03 (×3): 1 via ORAL
  Filled 2019-03-01 (×3): qty 1

## 2019-03-01 MED ORDER — SODIUM CHLORIDE 0.9 % IV BOLUS
800.0000 mL | Freq: Once | INTRAVENOUS | Status: AC
  Administered 2019-03-01: 800 mL via INTRAVENOUS

## 2019-03-01 MED ORDER — INSULIN ASPART 100 UNIT/ML ~~LOC~~ SOLN
0.0000 [IU] | SUBCUTANEOUS | Status: DC
  Administered 2019-03-01: 1 [IU] via SUBCUTANEOUS
  Administered 2019-03-01: 2 [IU] via SUBCUTANEOUS
  Administered 2019-03-01: 1 [IU] via SUBCUTANEOUS
  Administered 2019-03-01: 3 [IU] via SUBCUTANEOUS
  Administered 2019-03-01: 2 [IU] via SUBCUTANEOUS
  Administered 2019-03-02 (×6): 1 [IU] via SUBCUTANEOUS
  Administered 2019-03-03: 3 [IU] via SUBCUTANEOUS
  Filled 2019-03-01: qty 0.09

## 2019-03-01 MED ORDER — ONDANSETRON HCL 4 MG PO TABS: 4.0000 mg | ORAL_TABLET | Freq: Four times a day (QID) | ORAL | Status: DC | PRN

## 2019-03-01 MED ORDER — ENOXAPARIN SODIUM 80 MG/0.8ML ~~LOC~~ SOLN
70.0000 mg | Freq: Every day | SUBCUTANEOUS | Status: DC
  Administered 2019-03-01 – 2019-03-03 (×3): 70 mg via SUBCUTANEOUS
  Filled 2019-03-01: qty 0.7
  Filled 2019-03-01 (×2): qty 0.8

## 2019-03-01 MED ORDER — APIXABAN 2.5 MG PO TABS
2.5000 mg | ORAL_TABLET | Freq: Two times a day (BID) | ORAL | Status: DC
  Filled 2019-03-01: qty 1

## 2019-03-01 MED ORDER — ONDANSETRON HCL 4 MG/2ML IJ SOLN: 4.0000 mg | Freq: Four times a day (QID) | INTRAMUSCULAR | Status: DC | PRN

## 2019-03-01 NOTE — Progress Notes (Signed)
PROGRESS NOTE    [name] [name]  AOZ:[id number] DOB: 1936/04/25 DOA: 02/28/2019 PCP: [name], MD   Brief Narrative:  Per admitting history and physical: [name] [name] is a 83 y.o. male with medical history significant of prostate CA being treated with Xtandi, CKD stage 3, PAF, SSS s/p PPM.  Patient reports loss of appetite with generalized weakness and lethargy, and has had multiple syncopal episodes since last Thursday. Has had admissions in the past for the same. Patient also having some nausea with a couple episodes of nonbloody vomiting, decreased urination and anorexia for 3 days. He denies any abdominal pain or diarrhea. He denies any fevers, chills, cough, shortness of breath, or chest pain. Reports that the syncopal episodes have occurred while standing up to go to the bathroom. He gets lightheaded when sitting up or standing. There has not been any chest pain or palpitations associated with this.  ED Course: Creat 7, up from 2.0.  Lactate 4.8 WBC 17k, HGB 18 up from 10.3 last month (15 on admission last month but was so dehydrated was down to 10 by discharge a couple of days later). CXR neg.  No fever, no tachycardia.  BP 172/85.    Assessment & Plan:   Principal Problem:   Acute renal failure superimposed on stage 3 chronic kidney disease (HCC) Active Problems:   Paroxysmal atrial flutter (HCC)   Essential hypertension, benign   Carcinoma of prostate (HCC)   Dehydration   1. AKF on CKD stage 3 - 1. Agree, likely due to dehydration due to no PO intake but still with significant elevation and electrolyte abnormalities 2. IVF: 2L NS bolus in ED then isotonic bicarb at 125 cc/hr 3. Repeat BMP in AM shows improvement 4. Strict intake and output 5. UA shows large leukocyte, many bacteria,  culture and ABX 6. CT negative for obstructive uropathy and small bowel obstruction 2. Lactic acidosis - 1. Agree, likely secondary to dehydration 2. Recheck after  hydration 3. Monitor WBC as well but as noted on admission vital signs not c/w sepsis.  Patient appears well 3. PAF - 1. Continue metoprolol 2. Continue eliquis 4. HTN - 1. Continue metoprolol 2. Continue flomax 5. Prostate CA - 1. Holding PO Xtandi due to N/V  DVT prophylaxis: Lovenox SQ  Code Status: Full    Code Status Orders  (From admission, onward)         Start     Ordered   03/01/19 0011  Full code  Continuous     03/01/19 0011        Code Status History    Date Active Date Inactive Code Status Order ID Comments User Context   01/11/2019 0305 01/14/2019 1710 Full Code [id number]  Vianne Bulls, MD Inpatient   10/27/2018 1756 10/29/2018 2219 Full Code [id number]  [place], [place], DO ED   06/07/2018 1800 06/09/2018 1259 Full Code [id number]  Bonnell Public, MD ED   11/25/2013 1602 11/30/2013 1826 Full Code [id number]  Armandina Gemma, MD Inpatient   05/13/2013 1508 05/19/2013 1559 Full Code [id number]  Armandina Gemma, MD Inpatient   12/23/2011 0057 12/31/2011 1610 Full Code [id number]  Emilee Hero, RN Inpatient   11/28/2011 1839 11/29/2011 1347 Full Code [id number]  Willodean Rosenthal, RN Inpatient   11/21/2011 1556 11/24/2011 1637 Full Code [id number]  Nuala Alpha, RN Inpatient   11/20/2011 1142 11/21/2011 1556 Full Code [id number]  Rennie Plowman, RN Inpatient   Advance Care Planning Activity  Family Communication: Discussed in detail with patient Disposition Plan:   Patient remained inpatient for continued treatment of acute on chronic renal failure, electrolyte abnormalities, profound dehydration. Consults called: None Admission status: Inpatient   Consultants:   None  Procedures:  Ct Abdomen Pelvis Wo Contrast  Result Date: 03/01/2019 CLINICAL DATA:  83 year old male with nausea and vomiting. Evaluate for bowel obstruction. History of prostate cancer. EXAM: CT ABDOMEN AND PELVIS WITHOUT CONTRAST TECHNIQUE: Multidetector CT imaging of the abdomen and pelvis was  performed following the standard protocol without IV contrast. COMPARISON:  CT of the abdomen pelvis dated 02/10/2017 FINDINGS: Evaluation of this exam is limited in the absence of intravenous contrast. Lower chest: Right lung base linear atelectasis/scarring. The visualized lung bases are otherwise clear. Partially visualized pacemaker wire. No intra-abdominal free air or free fluid. Hepatobiliary: Probable small focus of calcified granuloma in the right lobe of the liver. No intrahepatic biliary ductal dilatation. The gallbladder is unremarkable. Pancreas: Unremarkable. No pancreatic ductal dilatation or surrounding inflammatory changes. Spleen: Normal in size without focal abnormality. Adrenals/Urinary Tract: Mild bilateral adrenal thickening/hyperplasia. The kidneys, visualized ureters, and urinary bladder appear unremarkable. Stomach/Bowel: Postsurgical changes of partial sigmoid resection with anastomotic suture. There is no bowel obstruction or active inflammation. The appendix is normal. Vascular/Lymphatic: Moderate aortoiliac atherosclerotic disease. The IVC is unremarkable. No portal venous gas. There is no adenopathy. Reproductive: Prostate brachytherapy seeds noted. Other: Midline vertical anterior abdominal wall incisional scar. Musculoskeletal: Sclerotic area involving the left pubic bone and anterior column of the left acetabulum. This appears slightly more heterogeneous since the prior CT and may represent post treatment changes. No new metastatic disease. No acute osseous pathology. IMPRESSION: 1. No acute intra-abdominal or pelvic pathology. No bowel obstruction or active inflammation. Normal appendix. 2. Aortic Atherosclerosis (ICD10-I70.0). 3. Prostate brachytherapy seeds. 4. Sclerotic lesion in the left anterior acetabulum consistent with metastatic disease with probable post treatment changes. No new osseous metastasis. Electronically Signed   By: Anner Crete M.D.   On: 03/01/2019 03:24    Dg Chest 2 View  Result Date: 02/28/2019 CLINICAL DATA:  Weakness EXAM: CHEST - 2 VIEW COMPARISON:  Radiograph 01/10/2019 FINDINGS: Pacer pack overlies the left chest wall with leads in the right atrium and apex. Mild hyperinflation similar to priors. No consolidation, features of edema, pneumothorax, or effusion. Pulmonary vascularity is normally distributed. The cardiomediastinal contours are unremarkable. No acute osseous or soft tissue abnormality. Degenerative changes are present in the imaged spine and shoulders. IMPRESSION: Chronic hyperinflation. No acute cardiopulmonary abnormality. Electronically Signed   By: Lovena Le M.D.   On: 02/28/2019 19:50   Ct Head Wo Contrast  Result Date: 02/28/2019 CLINICAL DATA:  Syncope. EXAM: CT HEAD WITHOUT CONTRAST TECHNIQUE: Contiguous axial images were obtained from the base of the skull through the vertex without intravenous contrast. COMPARISON:  01/10/2019 FINDINGS: Brain: No evidence of acute infarction, hemorrhage, hydrocephalus, extra-axial collection or mass lesion/mass effect. Mild age related atrophy is noted. Vascular: No hyperdense vessel or unexpected calcification. Skull: Normal. Negative for fracture or focal lesion. Sinuses/Orbits: No acute finding. Other: None. IMPRESSION: No acute intracranial abnormality detected. Electronically Signed   By: Constance Holster M.D.   On: 02/28/2019 20:00     Antimicrobials:   Ceftriaxone   Subjective: No acute events. Blood pressure mildly soft x1 Not ill-appearing  Objective: Vitals:   03/01/19 1400 03/01/19 1430 03/01/19 1439 03/01/19 1500  BP: (!) 106/50 (!) 120/56 (!) 120/56 (!) 95/54  Pulse: 60  (!) 50 (!) 57  Resp: 16  15 16   Temp:      TempSrc:      SpO2: 99%  99% 99%  Height:        Intake/Output Summary (Last 24 hours) at 03/01/2019 1558 Last data filed at 03/01/2019 1202 Gross per 24 hour  Intake 4200 ml  Output --  Net 4200 ml   There were no vitals filed for  this visit.  Examination:  General exam: Appears calm and comfortable  Respiratory system: Clear to auscultation. Respiratory effort normal. Cardiovascular system: S1 & S2 heard, RRR. No JVD, murmurs, rubs, gallops or clicks. No pedal edema. Gastrointestinal system: Abdomen is nondistended, soft and nontender. No organomegaly or masses felt. Normal bowel sounds heard. Central nervous system: Alert and oriented. No focal neurological deficits. Extremities: Warm well perfused no edema Skin: No rashes, lesions or ulcers, dry mild skin tenting Psychiatry: Judgement and insight appear normal. Mood & affect appropriate.     Data Reviewed: I have personally reviewed following labs and imaging studies  CBC: Recent Labs  Lab 02/28/19 2244  WBC 17.3*  NEUTROABS 14.6*  HGB 18.0*  HCT 50.7  MCV 90.1  PLT 096   Basic Metabolic Panel: Recent Labs  Lab 02/28/19 2244 03/01/19 1228  NA 128* 129*  K 4.0 3.0*  CL 87* 90*  CO2 14* 23  GLUCOSE 179* 328*  BUN 102* 90*  CREATININE 7.16* 4.36*  CALCIUM 9.2 6.6*   GFR: CrCl cannot be calculated (Unknown ideal weight.). Liver Function Tests: Recent Labs  Lab 02/28/19 2244  AST 33  ALT 15  ALKPHOS 123  BILITOT 0.9  PROT 9.4*  ALBUMIN 4.7   No results for input(s): LIPASE, AMYLASE in the last 168 hours. No results for input(s): AMMONIA in the last 168 hours. Coagulation Profile: Recent Labs  Lab 03/01/19 0034  INR 1.2   Cardiac Enzymes: No results for input(s): CKTOTAL, CKMB, CKMBINDEX, TROPONINI in the last 168 hours. BNP (last 3 results) No results for input(s): PROBNP in the last 8760 hours. HbA1C: Recent Labs    03/01/19 0034  HGBA1C 6.5*   CBG: Recent Labs  Lab 03/01/19 0238 03/01/19 0542 03/01/19 0837 03/01/19 1304  GLUCAP 154* 190* 146* 203*   Lipid Profile: No results for input(s): CHOL, HDL, LDLCALC, TRIG, CHOLHDL, LDLDIRECT in the last 72 hours. Thyroid Function Tests: No results for input(s): TSH,  T4TOTAL, FREET4, T3FREE, THYROIDAB in the last 72 hours. Anemia Panel: No results for input(s): VITAMINB12, FOLATE, FERRITIN, TIBC, IRON, RETICCTPCT in the last 72 hours. Sepsis Labs: Recent Labs  Lab 02/28/19 2244 03/01/19 0034  LATICACIDVEN 4.8* 4.8*    Recent Results (from the past 240 hour(s))  SARS CORONAVIRUS 2 (TAT 6-24 HRS) Nasopharyngeal Nasopharyngeal Swab     Status: None   Collection Time: 02/28/19 11:34 PM   Specimen: Nasopharyngeal Swab  Result Value Ref Range Status   SARS Coronavirus 2 NEGATIVE NEGATIVE Final    Comment: (NOTE) SARS-CoV-2 target nucleic acids are NOT DETECTED. The SARS-CoV-2 RNA is generally detectable in upper and lower respiratory specimens during the acute phase of infection. Negative results do not preclude SARS-CoV-2 infection, do not rule out co-infections with other pathogens, and should not be used as the sole basis for treatment or other patient management decisions. Negative results must be combined with clinical observations, patient history, and epidemiological information. The expected result is Negative. Fact Sheet for Patients: SugarRoll.be Fact Sheet for Healthcare Providers: https://www.woods-mathews.com/ This test is not yet approved or cleared by the  [place] [employer]ural [employer] and  has been authorized for detection and/or diagnosis of SARS-CoV-2 by FDA under an Print production planner (EUA). This EUA will remain  in effect (meaning this test can be used) for the duration of the COVID-19 declaration under Section 56 4(b)(1) of the Act, 21 U.S.C. section 360bbb-3(b)(1), unless the authorization is terminated or revoked sooner. Performed at [facility], [place] [address] [place], [place] [address]   Blood Culture (routine x 2)     Status: None (Preliminary result)   Collection Time: 03/01/19 12:34 AM   Specimen: BLOOD  Result Value Ref Range Status   Specimen Description   Final     BLOOD BLOOD LEFT HAND Performed at [facility] [address] [place], [place] [address]    Special Requests   Final    BOTTLES DRAWN AEROBIC AND ANAEROBIC Blood Culture adequate volume Performed at [facility] [address], [place], [place] [address]    Culture   Final    NO GROWTH < 12 HOURS Performed at [facility] [address], [place], [place] [address]    Report Status PENDING  Incomplete  Blood Culture (routine x 2)     Status: None (Preliminary result)   Collection Time: 03/01/19 12:34 AM   Specimen: BLOOD  Result Value Ref Range Status   Specimen Description   Final    BLOOD BLOOD LEFT ARM Performed at [facility] [address] [place], [place] [address]    Special Requests   Final    BOTTLES DRAWN AEROBIC ONLY Blood Culture adequate volume Performed at [facility] [address], [place], [place] [address]    Culture   Final    NO GROWTH < 12 HOURS Performed at [facility] [address], [place],  [address]    Report Status PENDING  Incomplete         Radiology Studies: Ct Abdomen Pelvis Wo Contrast  Result Date: 03/01/2019 CLINICAL DATA:  83 year old male with nausea and vomiting. Evaluate for bowel obstruction. History of prostate cancer. EXAM: CT ABDOMEN AND PELVIS WITHOUT CONTRAST TECHNIQUE: Multidetector CT imaging of the abdomen and pelvis was performed following the standard protocol without IV contrast. COMPARISON:  CT of the abdomen pelvis dated 02/10/2017 FINDINGS: Evaluation of this exam is limited in the absence of intravenous contrast. Lower chest: Right lung base linear atelectasis/scarring. The visualized lung bases are otherwise clear. Partially visualized pacemaker wire. No intra-abdominal free air or free fluid. Hepatobiliary: Probable small focus of calcified granuloma in the right lobe of the liver. No intrahepatic biliary ductal  dilatation. The gallbladder is unremarkable. Pancreas: Unremarkable. No pancreatic ductal dilatation or surrounding inflammatory changes. Spleen: Normal in size without focal abnormality. Adrenals/Urinary Tract: Mild bilateral adrenal thickening/hyperplasia. The kidneys, visualized ureters, and urinary bladder appear unremarkable. Stomach/Bowel: Postsurgical changes of partial sigmoid resection with anastomotic suture. There is no bowel obstruction or active inflammation. The appendix is normal. Vascular/Lymphatic: Moderate aortoiliac atherosclerotic disease. The IVC is unremarkable. No portal venous gas. There is no adenopathy. Reproductive: Prostate brachytherapy seeds noted. Other: Midline vertical anterior abdominal wall incisional scar. Musculoskeletal: Sclerotic area involving the left pubic bone and anterior column of the left acetabulum. This appears slightly more heterogeneous since the prior CT and may represent post treatment changes. No new metastatic disease. No acute osseous pathology. IMPRESSION: 1. No acute intra-abdominal or pelvic pathology. No bowel obstruction or active inflammation. Normal appendix. 2.  Aortic Atherosclerosis (ICD10-I70.0). 3. Prostate brachytherapy seeds. 4. Sclerotic lesion in the left anterior acetabulum consistent with metastatic disease with probable post treatment changes. No new osseous metastasis. Electronically Signed   By: Anner Crete M.D.   On: 03/01/2019 03:24   Dg Chest 2 View  Result Date: 02/28/2019 CLINICAL DATA:  Weakness EXAM: CHEST - 2 VIEW COMPARISON:  Radiograph 01/10/2019 FINDINGS: Pacer pack overlies the left chest wall with leads in the right atrium and apex. Mild hyperinflation similar to priors. No consolidation, features of edema, pneumothorax, or effusion. Pulmonary vascularity is normally distributed. The cardiomediastinal contours are unremarkable. No acute osseous or soft tissue abnormality. Degenerative changes are present in the imaged  spine and shoulders. IMPRESSION: Chronic hyperinflation. No acute cardiopulmonary abnormality. Electronically Signed   By: Lovena Le M.D.   On: 02/28/2019 19:50   Ct Head Wo Contrast  Result Date: 02/28/2019 CLINICAL DATA:  Syncope. EXAM: CT HEAD WITHOUT CONTRAST TECHNIQUE: Contiguous axial images were obtained from the base of the skull through the vertex without intravenous contrast. COMPARISON:  01/10/2019 FINDINGS: Brain: No evidence of acute infarction, hemorrhage, hydrocephalus, extra-axial collection or mass lesion/mass effect. Mild age related atrophy is noted. Vascular: No hyperdense vessel or unexpected calcification. Skull: Normal. Negative for fracture or focal lesion. Sinuses/Orbits: No acute finding. Other: None. IMPRESSION: No acute intracranial abnormality detected. Electronically Signed   By: Constance Holster M.D.   On: 02/28/2019 20:00        Scheduled Meds:  atorvastatin  20 mg Oral q1800   enoxaparin (LOVENOX) injection  70 mg Subcutaneous Daily   insulin aspart  0-9 Units Subcutaneous Q4H   metoprolol tartrate  12.5 mg Oral Daily   multivitamin with minerals  1 tablet Oral Daily   tamsulosin  0.4 mg Oral QHS   Continuous Infusions:  sodium bicarbonate 150 mEq in dextrose 5% 1000 mL 150 mEq (03/01/19 1231)     LOS: 0 days    Time spent: 35 min    Nicolette Bang, MD [facility]  If 7PM-7AM, please contact night-coverage  03/01/2019, 3:58 PM

## 2019-03-01 NOTE — ED Provider Notes (Addendum)
12:08 AM Patient to be admitted for acute renal failure suspected secondary to profound dehydration.  He has history of a similar admission at the beginning of September.  Creatinine is 7.16 up from 2.  Metabolic acidosis may be secondary to acute renal failure versus elevated lactate.  Leukocytosis favored to be a stress response as patient does not have any other symptoms suspicious for infectious etiology.  Temperature ordered to be taken by nursing, but other vital signs not consistent with sepsis picture.  Plan to currently hold antibiotics.  Dr. Alcario Drought to admit.  12:17 AM Patient updated on plan for admission.  He is agreeable.  Temp at bedside on my assessment 97.64F.   Antonietta Breach, PA-C 03/01/19 0010    Antonietta Breach, PA-C 03/01/19 [facility]    Rolland Porter, MD 03/01/19 [phone]

## 2019-03-01 NOTE — ED Notes (Signed)
Pt made aware urine sample was still needed. Pt states he is currently unable to void.

## 2019-03-01 NOTE — Plan of Care (Signed)

## 2019-03-01 NOTE — Progress Notes (Signed)
Attempted to call to the ED in order to speak with nurse regarding cardiac telemetry monitoring.  That nurse was busy, so asked to speak with the charge nurse.  The charge nurse was unsure and stated she would have the nurse to call regarding the patient.

## 2019-03-01 NOTE — ED Notes (Signed)
Report called to Sierra RN.

## 2019-03-01 NOTE — H&P (Signed)
History and Physical    Kohen Reither DJM:[id number] DOB: 08-Jun-1935 DOA: 02/28/2019  PCP: [name], MD  Patient coming from: Home  I have personally briefly reviewed patient's old medical records in [place]  Chief Complaint: Generalized weakness  HPI: [name] [name] is a 83 y.o. male with medical history significant of prostate CA being treated with Xtandi, CKD stage 3, PAF, SSS s/p PPM.  Patient reports loss of appetite with generalized weakness and lethargy, and has had multiple syncopal episodes since last Thursday. Has had admissions in the past for the same.  Patient also having some nausea with a couple episodes of nonbloody vomiting, decreased urination and anorexia for 3 days.  He denies any abdominal pain or diarrhea.  He denies any fevers, chills, cough, shortness of breath, or chest pain.  Reports that the syncopal episodes have occurred while standing up to go to the bathroom. He gets lightheaded when sitting up or standing.  There has not been any chest pain or palpitations associated with this.   ED Course: Creat 7, up from 2.0.  Lactate 4.8  WBC 17k, HGB 18 up from 10.3 last month (15 on admission last month but was so dehydrated was down to 10 by discharge a couple of days later).  CXR neg.  No fever, no tachycardia.  BP 172/85.   Review of Systems: As per HPI, otherwise all review of systems negative.  Past Medical History:  Diagnosis Date   AKI (acute kidney injury) ([place]) 06/08/2018   Atrial flutter ([place])    Carcinoma of prostate ([place]) 12/30/2011   Treated with seed/radiation therapy.    Colostomy in place [facility])    Difficulty sleeping    lives in shelter currently   Diverticulitis    Dyslipidemia 12/30/2011   Encounter for care of pacemaker 06/17/2018   Frequency of urination    Heart murmur    Hypertension 04/30/11   Cardioversion 06/16/11   Near syncope 06/07/2018   Nocturia    Orthostatic hypotension 06/08/2018   Pacemaker 7/13   sick sinus syndrome/[employer] pacemaker   Paroxysmal atrial flutter ([place]) 06/16/2011   Prostate cancer ([place])    Sick sinus syndrome ([place]) 11/28/2011   Syncope and collapse 11/20/2011   Atrial and ventricular standstill > 3 seconds.    Thyroid disease    had "overactive thyroid in 1997" - no known problem since    Past Surgical History:  Procedure Laterality Date   ATRIAL FLUTTER ABLATION N/A 11/21/2011   Procedure: ATRIAL FLUTTER ABLATION;  Surgeon: Thompson Grayer, MD;  Location: [facility] CATH LAB;  Service: Cardiovascular;  Laterality: N/A;   CARDIAC ELECTROPHYSIOLOGY STUDY AND ABLATION  7/13   CARDIOVERSION  06/16/2011   Procedure: CARDIOVERSION;  Surgeon: Laverda Page, MD;  Location: [place];  Service: Cardiovascular;  Laterality: N/A;   COLON SURGERY  05/13/2013   COLOSTOMY CLOSURE N/A 11/25/2013   Procedure: COLOSTOMY CLOSURE;  Surgeon: Earnstine Regal, MD;  Location: [facility] ORS;  Service: General;  Laterality: N/A;   COLOSTOMY CLOSURE  11/25/2013   PACEMAKER INSERTION  11/28/11   SJM Accent DR RF implanted by Dr Rayann Heman   PARTIAL COLECTOMY N/A 05/13/2013   Procedure: sigmoid COLECTOMY  colostomy ;  Surgeon: Earnstine Regal, MD;  Location: [facility] ORS;  Service: General;  Laterality: N/A;   PERMANENT PACEMAKER INSERTION N/A 11/28/2011   Procedure: PERMANENT PACEMAKER INSERTION;  Surgeon: Thompson Grayer, MD;  Location: [facility] CATH LAB;  Service: Cardiovascular;  Laterality: N/A;   RADIOACTIVE SEED  IMPLANT     TONSILLECTOMY       reports that he quit smoking about 9 years ago. His smoking use included cigarettes. He has a 25.00 pack-year smoking history. He has never used smokeless tobacco. He reports that he does not drink alcohol or use drugs.  Allergies  Allergen Reactions   Xarelto [Rivaroxaban] Other (See Comments)    "I didn't like the way it made me feel" (patient didn't elaborate)    Family History  Problem Relation Age of Onset   Leukemia Mother    Dementia Father      Prior to  Admission medications   Medication Sig Start Date End Date Taking? Authorizing Provider  apixaban (ELIQUIS) 2.5 MG TABS tablet Take 1 tablet (2.5 mg total) by mouth 2 (two) times daily. 10/29/18  Yes Mercy Riding, MD  atorvastatin (LIPITOR) 20 MG tablet TAKE 1 TABLET BY MOUTH DAILY Patient taking differently: Take 20 mg by mouth daily.  01/03/19  Yes Adrian Prows, MD  metoprolol tartrate (LOPRESSOR) 25 MG tablet Take 0.5 tablets (12.5 mg total) by mouth daily. 01/14/19  Yes Georgette Shell, MD  Multiple Vitamin (MULTIVITAMIN WITH MINERALS) TABS tablet Take 1 tablet by mouth daily. 10/29/18  Yes Mercy Riding, MD  ondansetron (ZOFRAN) 4 MG tablet Take 1 tablet (4 mg total) by mouth every 6 (six) hours as needed for nausea. 01/14/19  Yes Georgette Shell, MD  Tamsulosin HCl (FLOMAX) 0.4 MG CAPS Take 0.4 mg by mouth at bedtime.    Yes [provider]  XTANDI 40 MG capsule Take 80 mg by mouth 2 (two) times daily.  05/26/18  Yes [provider]    Physical Exam: Vitals:   02/28/19 2223 02/28/19 2300 02/28/19 2330 03/01/19 0000  BP: 137/73 (!) 157/94 (!) 175/89 (!) 172/85  Pulse: (!) 56 (!) 51 65 (!) 50  Resp: 18 18 14 14   SpO2: 99% 100% 100% 100%  Height:        Constitutional: NAD, calm, comfortable Eyes: PERRL, lids and conjunctivae normal ENMT: Mucous membranes are dry. Posterior pharynx clear of any exudate or lesions.Normal dentition.  Neck: normal, supple, no masses, no thyromegaly Respiratory: clear to auscultation bilaterally, no wheezing, no crackles. Normal respiratory effort. No accessory muscle use.  Cardiovascular: Regular rate and rhythm, no murmurs / rubs / gallops. No extremity edema. 2+ pedal pulses. No carotid bruits.  Abdomen: no tenderness, no masses palpated. No hepatosplenomegaly. Bowel sounds positive.  Musculoskeletal: no clubbing / cyanosis. No joint deformity upper and lower extremities. Good ROM, no contractures. Normal muscle tone.  Skin: no  rashes, lesions, ulcers. No induration Neurologic: CN 2-12 grossly intact. Sensation intact, DTR normal. Strength 5/5 in all 4.  Psychiatric: Normal judgment and insight. Alert and oriented x 3. Normal mood.    Labs on Admission: I have personally reviewed following labs and imaging studies  CBC: Recent Labs  Lab 02/28/19 2244  WBC 17.3*  NEUTROABS 14.6*  HGB 18.0*  HCT 50.7  MCV 90.1  PLT 453   Basic Metabolic Panel: Recent Labs  Lab 02/28/19 2244  NA 128*  K 4.0  CL 87*  CO2 14*  GLUCOSE 179*  BUN PENDING  CREATININE 7.16*  CALCIUM 9.2   GFR: CrCl cannot be calculated (Unknown ideal weight.). Liver Function Tests: Recent Labs  Lab 02/28/19 2244  AST 33  ALT 15  ALKPHOS 123  BILITOT 0.9  PROT 9.4*  ALBUMIN 4.7   No results for input(s): LIPASE, AMYLASE  in the last 168 hours. No results for input(s): AMMONIA in the last 168 hours. Coagulation Profile: No results for input(s): INR, PROTIME in the last 168 hours. Cardiac Enzymes: No results for input(s): CKTOTAL, CKMB, CKMBINDEX, TROPONINI in the last 168 hours. BNP (last 3 results) No results for input(s): PROBNP in the last 8760 hours. HbA1C: No results for input(s): HGBA1C in the last 72 hours. CBG: No results for input(s): GLUCAP in the last 168 hours. Lipid Profile: No results for input(s): CHOL, HDL, LDLCALC, TRIG, CHOLHDL, LDLDIRECT in the last 72 hours. Thyroid Function Tests: No results for input(s): TSH, T4TOTAL, FREET4, T3FREE, THYROIDAB in the last 72 hours. Anemia Panel: No results for input(s): VITAMINB12, FOLATE, FERRITIN, TIBC, IRON, RETICCTPCT in the last 72 hours. Urine analysis:    Component Value Date/Time   COLORURINE RED (A) 01/11/2019 0330   APPEARANCEUR TURBID (A) 01/11/2019 0330   LABSPEC  01/11/2019 0330    TEST NOT REPORTED DUE TO COLOR INTERFERENCE OF URINE PIGMENT   PHURINE  01/11/2019 0330    TEST NOT REPORTED DUE TO COLOR INTERFERENCE OF URINE PIGMENT   GLUCOSEU (A)  01/11/2019 0330    TEST NOT REPORTED DUE TO COLOR INTERFERENCE OF URINE PIGMENT   HGBUR (A) 01/11/2019 0330    TEST NOT REPORTED DUE TO COLOR INTERFERENCE OF URINE PIGMENT   BILIRUBINUR (A) 01/11/2019 0330    TEST NOT REPORTED DUE TO COLOR INTERFERENCE OF URINE PIGMENT   KETONESUR (A) 01/11/2019 0330    TEST NOT REPORTED DUE TO COLOR INTERFERENCE OF URINE PIGMENT   PROTEINUR (A) 01/11/2019 0330    TEST NOT REPORTED DUE TO COLOR INTERFERENCE OF URINE PIGMENT   UROBILINOGEN 1.0 04/10/2013 0706   NITRITE (A) 01/11/2019 0330    TEST NOT REPORTED DUE TO COLOR INTERFERENCE OF URINE PIGMENT   LEUKOCYTESUR (A) 01/11/2019 0330    TEST NOT REPORTED DUE TO COLOR INTERFERENCE OF URINE PIGMENT    Radiological Exams on Admission: Dg Chest 2 View  Result Date: 02/28/2019 CLINICAL DATA:  Weakness EXAM: CHEST - 2 VIEW COMPARISON:  Radiograph 01/10/2019 FINDINGS: Pacer pack overlies the left chest wall with leads in the right atrium and apex. Mild hyperinflation similar to priors. No consolidation, features of edema, pneumothorax, or effusion. Pulmonary vascularity is normally distributed. The cardiomediastinal contours are unremarkable. No acute osseous or soft tissue abnormality. Degenerative changes are present in the imaged spine and shoulders. IMPRESSION: Chronic hyperinflation. No acute cardiopulmonary abnormality. Electronically Signed   By: Lovena Le M.D.   On: 02/28/2019 19:50   Ct Head Wo Contrast  Result Date: 02/28/2019 CLINICAL DATA:  Syncope. EXAM: CT HEAD WITHOUT CONTRAST TECHNIQUE: Contiguous axial images were obtained from the base of the skull through the vertex without intravenous contrast. COMPARISON:  01/10/2019 FINDINGS: Brain: No evidence of acute infarction, hemorrhage, hydrocephalus, extra-axial collection or mass lesion/mass effect. Mild age related atrophy is noted. Vascular: No hyperdense vessel or unexpected calcification. Skull: Normal. Negative for fracture or focal lesion.  Sinuses/Orbits: No acute finding. Other: None. IMPRESSION: No acute intracranial abnormality detected. Electronically Signed   By: Constance Holster M.D.   On: 02/28/2019 20:00    EKG: Independently reviewed.  Assessment/Plan Principal Problem:   Acute renal failure superimposed on stage 3 chronic kidney disease (HCC) Active Problems:   Paroxysmal atrial flutter (HCC)   Essential hypertension, benign   Carcinoma of prostate (HCC)   Dehydration    1. AKF on CKD stage 3 - 1. Likely due to dehydration due to  no PO intake 2. IVF: 2L NS bolus in ED then isotonic bicarb at 125 cc/hr 3. Repeat BMP in AM 4. Strict intake and output 5. UA 6. Given N/V: obtaining CT abd / pelvis w/o contrast to r/o SBO, will also r/o obstructive uropathy. 2. Lactic acidosis - 1. Likely secondary to dehydration 2. Repeat lactate after IVF bolus 3. Keep eye on WBC as well but vital signs not c/w sepsis. 3. PAF - 1. Continue metoprolol 2. Continue eliquis 4. HTN - 1. Continue metoprolol 2. Continue flomax 5. Prostate CA - 1. Holding PO Xtandi due to N/V  DVT prophylaxis: Eliquis Code Status: Full Family Communication: No family in room Disposition Plan: Home after admit Consults called: None Admission status: Admit to inpatient  Severity of Illness: The appropriate patient status for this patient is INPATIENT. Inpatient status is judged to be reasonable and necessary in order to provide the required intensity of service to ensure the patient's safety. The patient's presenting symptoms, physical exam findings, and initial radiographic and laboratory data in the context of their chronic comorbidities is felt to place them at high risk for further clinical deterioration. Furthermore, it is not anticipated that the patient will be medically stable for discharge from the hospital within 2 midnights of admission. The following factors support the patient status of inpatient.   IP status for AKF with creat  7 up from 2.0   * I certify that at the point of admission it is my clinical judgment that the patient will require inpatient hospital care spanning beyond 2 midnights from the point of admission due to high intensity of service, high risk for further deterioration and high frequency of surveillance required.*    Lakina Mcintire M. DO [facility]  How to contact the [facility] Attending or Consulting provider [place] or covering provider during after hours [place], for this patient?  1. Check the care team in [facility] and look for a) attending/consulting [facility] provider listed and b) the [facility] team listed 2. Log into [email or web address]  Amion Physician Scheduling and messaging for groups and whole hospitals  On call and physician scheduling software for group practices, residents, hospitalists and other medical providers for call, clinic, rotation and shift schedules. On[employer] is a hospital-wide system for scheduling doctors and paging doctors on call. EasyPlot is for scientific plotting and data analysis.  [email or web address]  and use [place]'s universal password to access. If you do not have the password, please contact the hospital operator.  3. Locate the [facility] provider you are looking for under [facility] and page to a number that you can be directly reached. 4. If you still have difficulty reaching the provider, please page the [facility] (Director on Call) for the Hospitalists listed on amion for assistance.  03/01/2019, 12:22 AM

## 2019-03-01 NOTE — Progress Notes (Signed)
Pt's son, Tonatiuh Mallon, attempted to call this RN to get an update on pt. RN unavailable at that time. Pt's son did not answer phone on callback and voicemail box was full. Will try again in the morning.

## 2019-03-02 ENCOUNTER — Ambulatory Visit: Payer: Medicare Other | Admitting: Cardiology

## 2019-03-02 DIAGNOSIS — C61 Malignant neoplasm of prostate: Secondary | ICD-10-CM | POA: Diagnosis not present

## 2019-03-02 DIAGNOSIS — N179 Acute kidney failure, unspecified: Secondary | ICD-10-CM | POA: Diagnosis not present

## 2019-03-02 DIAGNOSIS — I10 Essential (primary) hypertension: Secondary | ICD-10-CM | POA: Diagnosis not present

## 2019-03-02 DIAGNOSIS — E860 Dehydration: Secondary | ICD-10-CM | POA: Diagnosis not present

## 2019-03-02 LAB — GLUCOSE, CAPILLARY
Glucose-Capillary: 120 mg/dL — ABNORMAL HIGH (ref 70–99)
Glucose-Capillary: 122 mg/dL — ABNORMAL HIGH (ref 70–99)
Glucose-Capillary: 125 mg/dL — ABNORMAL HIGH (ref 70–99)
Glucose-Capillary: 129 mg/dL — ABNORMAL HIGH (ref 70–99)
Glucose-Capillary: 138 mg/dL — ABNORMAL HIGH (ref 70–99)
Glucose-Capillary: 138 mg/dL — ABNORMAL HIGH (ref 70–99)
Glucose-Capillary: 142 mg/dL — ABNORMAL HIGH (ref 70–99)

## 2019-03-02 LAB — CBC WITH DIFFERENTIAL/PLATELET
Abs Immature Granulocytes: 0.03 10*3/uL (ref 0.00–0.07)
Basophils Absolute: 0 10*3/uL (ref 0.0–0.1)
Basophils Relative: 0 %
Eosinophils Absolute: 0 10*3/uL (ref 0.0–0.5)
Eosinophils Relative: 0 %
HCT: 30.9 % — ABNORMAL LOW (ref 39.0–52.0)
Hemoglobin: 11 g/dL — ABNORMAL LOW (ref 13.0–17.0)
Immature Granulocytes: 0 %
Lymphocytes Relative: 21 %
Lymphs Abs: 2.3 10*3/uL (ref 0.7–4.0)
MCH: 31.9 pg (ref 26.0–34.0)
MCHC: 35.6 g/dL (ref 30.0–36.0)
MCV: 89.6 fL (ref 80.0–100.0)
Monocytes Absolute: 1.6 10*3/uL — ABNORMAL HIGH (ref 0.1–1.0)
Monocytes Relative: 15 %
Neutro Abs: 6.7 10*3/uL (ref 1.7–7.7)
Neutrophils Relative %: 64 %
Platelets: 214 10*3/uL (ref 150–400)
RBC: 3.45 MIL/uL — ABNORMAL LOW (ref 4.22–5.81)
RDW: 13.2 % (ref 11.5–15.5)
WBC: 10.6 10*3/uL — ABNORMAL HIGH (ref 4.0–10.5)
nRBC: 0 % (ref 0.0–0.2)

## 2019-03-02 LAB — BASIC METABOLIC PANEL
Anion gap: 13 (ref 5–15)
BUN: 80 mg/dL — ABNORMAL HIGH (ref 8–23)
CO2: 30 mmol/L (ref 22–32)
Calcium: 7.6 mg/dL — ABNORMAL LOW (ref 8.9–10.3)
Chloride: 90 mmol/L — ABNORMAL LOW (ref 98–111)
Creatinine, Ser: 3.3 mg/dL — ABNORMAL HIGH (ref 0.61–1.24)
GFR calc Af Amer: 19 mL/min — ABNORMAL LOW (ref >60)
GFR calc non Af Amer: 17 mL/min — ABNORMAL LOW (ref >60)
Glucose, Bld: 132 mg/dL — ABNORMAL HIGH (ref 70–99)
Potassium: 2.5 mmol/L — CL (ref 3.5–5.1)
Sodium: 133 mmol/L — ABNORMAL LOW (ref 135–145)

## 2019-03-02 LAB — TSH: TSH: 0.295 u[IU]/mL — ABNORMAL LOW (ref 0.350–4.500)

## 2019-03-02 LAB — PROCALCITONIN: Procalcitonin: 0.12 ng/mL

## 2019-03-02 LAB — T4, FREE: Free T4: 1.18 ng/dL — ABNORMAL HIGH (ref 0.61–1.12)

## 2019-03-02 MED ORDER — POTASSIUM CHLORIDE CRYS ER 20 MEQ PO TBCR
40.0000 meq | EXTENDED_RELEASE_TABLET | Freq: Once | ORAL | Status: AC
  Administered 2019-03-02: 40 meq via ORAL
  Filled 2019-03-02: qty 2

## 2019-03-02 MED ORDER — POTASSIUM CHLORIDE 10 MEQ/100ML IV SOLN
10.0000 meq | INTRAVENOUS | Status: AC
  Administered 2019-03-02 (×4): 10 meq via INTRAVENOUS
  Filled 2019-03-02 (×4): qty 100

## 2019-03-02 MED ORDER — LACTATED RINGERS IV SOLN
INTRAVENOUS | Status: DC
  Administered 2019-03-02 – 2019-03-03 (×2): via INTRAVENOUS

## 2019-03-02 NOTE — Progress Notes (Signed)
CRITICAL VALUE ALERT  Critical Value:  K 2.5  Date & Time Notied: 03/02/19  Provider Notified: Baltazar Najjar, NP  Orders Received/Actions taken: Awaiting new orders.

## 2019-03-02 NOTE — Progress Notes (Signed)
PROGRESS NOTE    [name] [name]  RCV:[id number] DOB: 1935/07/21 DOA: 02/28/2019 PCP: [name], MD   Brief Narrative:  Per admitting history and physical: [name] a 83 y.o.malewith medical history significant ofprostate CA being treated with Gillermina Phy, CKD stage 3, PAF, SSS s/p PPM.  Patient reports loss of appetite with generalized weakness and lethargy, and has had multiple syncopal episodes since last Thursday. Has had admissions in the past for the same. Patient also having some nausea with a couple episodes of nonbloody vomiting, decreased urination and anorexia for 3 days. He denies any abdominal pain or diarrhea. He denies any fevers, chills, cough, shortness of breath, or chest pain. Reports that the syncopal episodes have occurred while standing up to go to the bathroom. He gets lightheaded when sitting up or standing. There has not been any chest pain or palpitations associated with this.  ED Course:Creat 7, up from 2.0. Lactate 4.8 WBC 17k, HGB 18 up from 10.3 last month (15 on admission last month but was so dehydrated was down to 10 by discharge a couple of days later). CXR neg. No fever, no tachycardia. BP 172/85.   Assessment & Plan:   Principal Problem:   Acute renal failure superimposed on stage 3 chronic kidney disease (HCC) Active Problems:   Paroxysmal atrial flutter (HCC)   Essential hypertension, benign   Carcinoma of prostate ([place])   Dehydration   Acute renal failure (ARF) ([place])   1. AKF on CKD stage 3 - 1. Agree, likely due to dehydration due to no PO intake but still with significant elevation and electrolyte abnormalities 2. IVF: 2L NS bolus in ED then isotonic bicarb at 125 cc/hr, now LR-K RELACED-recheck in AM 3. Repeat BMP in AM shows improvement 4. Strict intake and output 5. UA shows large leukocyte, many bacteria,  culture and ABX 6. CT negative for obstructive uropathy and small bowel obstruction 2. Lactic acidosis  - 1. Agree, likely secondary to dehydration 2. Recheck after hydration 3. Monitor WBC as well but as noted on admission vital signs not c/w sepsis.  Patient appears well 3. PAF - 1. Continue metoprolol 2. Continue eliquis 4. HTN - 1. Continue metoprolol 2. Continue flomax 5. Prostate CA - FOLLOW WITH OUTPT PROVIDER   6. uti-c/w abx, follow cx  DVT prophylaxis: Lovenox SQ  Code Status: Full    Code Status Orders  (From admission, onward)         Start     Ordered   03/01/19 0011  Full code  Continuous     03/01/19 0011        Code Status History    Date Active Date Inactive Code Status Order ID Comments User Context   01/11/2019 0305 01/14/2019 1710 Full Code [id number]  Vianne Bulls, MD Inpatient   10/27/2018 1756 10/29/2018 2219 Full Code [id number]  [place], [place], DO ED   06/07/2018 1800 06/09/2018 1259 Full Code [id number]  Bonnell Public, MD ED   11/25/2013 1602 11/30/2013 1826 Full Code [id number]  Armandina Gemma, MD Inpatient   05/13/2013 1508 05/19/2013 1559 Full Code [id number]  Armandina Gemma, MD Inpatient   12/23/2011 0057 12/31/2011 1610 Full Code [id number]  Emilee Hero, RN Inpatient   11/28/2011 1839 11/29/2011 1347 Full Code [id number]  Willodean Rosenthal, RN Inpatient   11/21/2011 1556 11/24/2011 1637 Full Code [id number]  Nuala Alpha, RN Inpatient   11/20/2011 1142 11/21/2011 1556 Full Code [id number]  Rennie Plowman, RN Inpatient   Advance  Care Planning Activity     Family Communication: Discussed in detail with patient Disposition Plan:   Patient remained inpatient for continued IV fluid resuscitation, monitoring electrolyte abnormalities, IV potassium repletion.  Patient not medically stable for discharge Consults called: None Admission status: Inpatient   Consultants:   None  Procedures:  Ct Abdomen Pelvis Wo Contrast  Result Date: 03/01/2019 CLINICAL DATA:  83 year old male with nausea and vomiting. Evaluate for bowel obstruction. History of prostate  cancer. EXAM: CT ABDOMEN AND PELVIS WITHOUT CONTRAST TECHNIQUE: Multidetector CT imaging of the abdomen and pelvis was performed following the standard protocol without IV contrast. COMPARISON:  CT of the abdomen pelvis dated 02/10/2017 FINDINGS: Evaluation of this exam is limited in the absence of intravenous contrast. Lower chest: Right lung base linear atelectasis/scarring. The visualized lung bases are otherwise clear. Partially visualized pacemaker wire. No intra-abdominal free air or free fluid. Hepatobiliary: Probable small focus of calcified granuloma in the right lobe of the liver. No intrahepatic biliary ductal dilatation. The gallbladder is unremarkable. Pancreas: Unremarkable. No pancreatic ductal dilatation or surrounding inflammatory changes. Spleen: Normal in size without focal abnormality. Adrenals/Urinary Tract: Mild bilateral adrenal thickening/hyperplasia. The kidneys, visualized ureters, and urinary bladder appear unremarkable. Stomach/Bowel: Postsurgical changes of partial sigmoid resection with anastomotic suture. There is no bowel obstruction or active inflammation. The appendix is normal. Vascular/Lymphatic: Moderate aortoiliac atherosclerotic disease. The IVC is unremarkable. No portal venous gas. There is no adenopathy. Reproductive: Prostate brachytherapy seeds noted. Other: Midline vertical anterior abdominal wall incisional scar. Musculoskeletal: Sclerotic area involving the left pubic bone and anterior column of the left acetabulum. This appears slightly more heterogeneous since the prior CT and may represent post treatment changes. No new metastatic disease. No acute osseous pathology. IMPRESSION: 1. No acute intra-abdominal or pelvic pathology. No bowel obstruction or active inflammation. Normal appendix. 2. Aortic Atherosclerosis (ICD10-I70.0). 3. Prostate brachytherapy seeds. 4. Sclerotic lesion in the left anterior acetabulum consistent with metastatic disease with probable post  treatment changes. No new osseous metastasis. Electronically Signed   By: Anner Crete M.D.   On: 03/01/2019 03:24   Dg Chest 2 View  Result Date: 02/28/2019 CLINICAL DATA:  Weakness EXAM: CHEST - 2 VIEW COMPARISON:  Radiograph 01/10/2019 FINDINGS: Pacer pack overlies the left chest wall with leads in the right atrium and apex. Mild hyperinflation similar to priors. No consolidation, features of edema, pneumothorax, or effusion. Pulmonary vascularity is normally distributed. The cardiomediastinal contours are unremarkable. No acute osseous or soft tissue abnormality. Degenerative changes are present in the imaged spine and shoulders. IMPRESSION: Chronic hyperinflation. No acute cardiopulmonary abnormality. Electronically Signed   By: Lovena Le M.D.   On: 02/28/2019 19:50   Ct Head Wo Contrast  Result Date: 02/28/2019 CLINICAL DATA:  Syncope. EXAM: CT HEAD WITHOUT CONTRAST TECHNIQUE: Contiguous axial images were obtained from the base of the skull through the vertex without intravenous contrast. COMPARISON:  01/10/2019 FINDINGS: Brain: No evidence of acute infarction, hemorrhage, hydrocephalus, extra-axial collection or mass lesion/mass effect. Mild age related atrophy is noted. Vascular: No hyperdense vessel or unexpected calcification. Skull: Normal. Negative for fracture or focal lesion. Sinuses/Orbits: No acute finding. Other: None. IMPRESSION: No acute intracranial abnormality detected. Electronically Signed   By: Constance Holster M.D.   On: 02/28/2019 20:00     Antimicrobials:   Ceftriaxone >10/27    Subjective: No acute events overnight Reports good urinary output   Objective: Vitals:   03/01/19 1734 03/01/19 2145 03/02/19 0405 03/02/19 0824  BP:  (!) 107/51 Marland Kitchen)  121/49   Pulse:  (!) 50 (!) 50 65  Resp:  14 16   Temp:  98.6 F (37 C) 98.5 F (36.9 C)   TempSrc:  Oral Oral   SpO2:  99% 98%   Weight: 71.4 kg     Height: 5\' 7"  (1.702 m)       Intake/Output Summary  (Last 24 hours) at 03/02/2019 1417 Last data filed at 03/02/2019 1116 Gross per 24 hour  Intake 2575.34 ml  Output 2800 ml  Net -224.66 ml   Filed Weights   03/01/19 1734  Weight: 71.4 kg    Examination:  General exam: Appears calm and comfortable, pleasant cooperative Respiratory system: Clear to auscultation. Respiratory effort normal. Cardiovascular system: S1 & S2 heard, RRR. No JVD, murmurs, rubs, gallops or clicks. No pedal edema. Gastrointestinal system: Abdomen is nondistended, soft and nontender. No organomegaly or masses felt. Normal bowel sounds heard. Central nervous system: Alert and oriented. No focal neurological deficits. Extremities: Warm well perfused, no edema Skin: No rashes, lesions or ulcers Psychiatry: Judgement and insight appear normal. Mood & affect appropriate.     Data Reviewed: I have personally reviewed following labs and imaging studies  CBC: Recent Labs  Lab 02/28/19 2244 03/02/19 0410  WBC 17.3* 10.6*  NEUTROABS 14.6* 6.7  HGB 18.0* 11.0*  HCT 50.7 30.9*  MCV 90.1 89.6  PLT 332 299   Basic Metabolic Panel: Recent Labs  Lab 02/28/19 2244 03/01/19 1228 03/02/19 0410  NA 128* 129* 133*  K 4.0 3.0* 2.5*  CL 87* 90* 90*  CO2 14* 23 30  GLUCOSE 179* 328* 132*  BUN 102* 90* 80*  CREATININE 7.16* 4.36* 3.30*  CALCIUM 9.2 6.6* 7.6*   GFR: Estimated Creatinine Clearance: 16.4 mL/min (A) (by C-G formula based on SCr of 3.3 mg/dL (H)). Liver Function Tests: Recent Labs  Lab 02/28/19 2244  AST 33  ALT 15  ALKPHOS 123  BILITOT 0.9  PROT 9.4*  ALBUMIN 4.7   No results for input(s): LIPASE, AMYLASE in the last 168 hours. No results for input(s): AMMONIA in the last 168 hours. Coagulation Profile: Recent Labs  Lab 03/01/19 0034  INR 1.2   Cardiac Enzymes: No results for input(s): CKTOTAL, CKMB, CKMBINDEX, TROPONINI in the last 168 hours. BNP (last 3 results) No results for input(s): PROBNP in the last 8760  hours. HbA1C: Recent Labs    03/01/19 0034  HGBA1C 6.5*   CBG: Recent Labs  Lab 03/01/19 1941 03/01/19 2359 03/02/19 0402 03/02/19 0739 03/02/19 1126  GLUCAP 144* 129* 122* 125* 138*   Lipid Profile: No results for input(s): CHOL, HDL, LDLCALC, TRIG, CHOLHDL, LDLDIRECT in the last 72 hours. Thyroid Function Tests: No results for input(s): TSH, T4TOTAL, FREET4, T3FREE, THYROIDAB in the last 72 hours. Anemia Panel: No results for input(s): VITAMINB12, FOLATE, FERRITIN, TIBC, IRON, RETICCTPCT in the last 72 hours. Sepsis Labs: Recent Labs  Lab 02/28/19 2244 03/01/19 0034 03/01/19 1611 03/01/19 1612 03/01/19 2030 03/02/19 0410  PROCALCITON  --   --  0.16  --   --  0.12  LATICACIDVEN 4.8* 4.8*  --  2.2* 4.8*  --     Recent Results (from the past 240 hour(s))  SARS CORONAVIRUS 2 (TAT 6-24 HRS) Nasopharyngeal Nasopharyngeal Swab     Status: None   Collection Time: 02/28/19 11:34 PM   Specimen: Nasopharyngeal Swab  Result Value Ref Range Status   SARS Coronavirus 2 NEGATIVE NEGATIVE Final    Comment: (NOTE) SARS-CoV-2 target nucleic acids  are NOT DETECTED. The SARS-CoV-2 RNA is generally detectable in upper and lower respiratory specimens during the acute phase of infection. Negative results do not preclude SARS-CoV-2 infection, do not rule out co-infections with other pathogens, and should not be used as the sole basis for treatment or other patient management decisions. Negative results must be combined with clinical observations, patient history, and epidemiological information. The expected result is Negative. Fact Sheet for Patients: SugarRoll.be Fact Sheet for Healthcare Providers: https://www.woods-mathews.com/ This test is not yet approved or cleared by the [place] FDA and  has been authorized for detection and/or diagnosis of SARS-CoV-2 by FDA under an Emergency Use Authorization (EUA). This EUA will remain  in  effect (meaning this test can be used) for the duration of the COVID-19 declaration under Section 56 4(b)(1) of the Act, 21 U.S.C. section 360bbb-3(b)(1), unless the authorization is terminated or revoked sooner. Performed at [facility], [place] [address] [place], [place] [address]   Blood Culture (routine x 2)     Status: None (Preliminary result)   Collection Time: 03/01/19 12:34 AM   Specimen: BLOOD  Result Value Ref Range Status   Specimen Description   Final    BLOOD BLOOD LEFT HAND Performed at [facility] [address] [place], [place] [address]    Special Requests   Final    BOTTLES DRAWN AEROBIC AND ANAEROBIC Blood Culture adequate volume Performed at [facility] [address] [place], [place] [address]    Culture   Final    NO GROWTH 1 DAY Performed at [facility], [place] [address], [place], [place] [address]    Report Status PENDING  Incomplete  Blood Culture (routine x 2)     Status: None (Preliminary result)   Collection Time: 03/01/19 12:34 AM   Specimen: BLOOD  Result Value Ref Range Status   Specimen Description   Final    BLOOD BLOOD LEFT ARM Performed at [facility] [address] [place], [place] [address]    Special Requests   Final    BOTTLES DRAWN AEROBIC ONLY Blood Culture adequate volume Performed at [facility] [address] [place], [place] [address]    Culture   Final    NO GROWTH 1 DAY Performed at [facility], [place] [address], [place], [place] [address]    Report Status PENDING  Incomplete  Urine culture     Status: Abnormal (Preliminary result)   Collection Time: 03/01/19  1:01 PM   Specimen: Urine, Clean Catch  Result Value Ref Range Status   Specimen Description   Final    URINE, CLEAN CATCH Performed at [facility], [place] [address], [place], [place] [address]    Special Requests   Final    NONE Performed at  [facility], [place] [address], [place], [place] [address]    Culture (A)  Final    >=100,000 COLONIES/mL ESCHERICHIA COLI SUSCEPTIBILITIES TO FOLLOW Performed at [facility], [place] [address] [place],  [address]    Report Status PENDING  Incomplete         Radiology Studies: Ct Abdomen Pelvis Wo Contrast  Result Date: 03/01/2019 CLINICAL DATA:  83 year old male with nausea and vomiting. Evaluate for bowel obstruction. History of prostate cancer. EXAM: CT ABDOMEN AND PELVIS WITHOUT CONTRAST TECHNIQUE: Multidetector CT imaging of the abdomen and pelvis was performed following the standard protocol without IV contrast.  COMPARISON:  CT of the abdomen pelvis dated 02/10/2017 FINDINGS: Evaluation of this exam is limited in the absence of intravenous contrast. Lower chest: Right lung base linear atelectasis/scarring. The visualized lung bases are otherwise clear. Partially visualized pacemaker wire. No intra-abdominal free air or free fluid. Hepatobiliary: Probable small focus of calcified granuloma in the right lobe of the liver. No intrahepatic biliary ductal dilatation. The gallbladder is unremarkable. Pancreas: Unremarkable. No pancreatic ductal dilatation or surrounding inflammatory changes. Spleen: Normal in size without focal abnormality. Adrenals/Urinary Tract: Mild bilateral adrenal thickening/hyperplasia. The kidneys, visualized ureters, and urinary bladder appear unremarkable. Stomach/Bowel: Postsurgical changes of partial sigmoid resection with anastomotic suture. There is no bowel obstruction or active inflammation. The appendix is normal. Vascular/Lymphatic: Moderate aortoiliac atherosclerotic disease. The IVC is unremarkable. No portal venous gas. There is no adenopathy. Reproductive: Prostate brachytherapy seeds noted. Other: Midline vertical anterior abdominal wall incisional scar. Musculoskeletal: Sclerotic area involving the left pubic bone and anterior  column of the left acetabulum. This appears slightly more heterogeneous since the prior CT and may represent post treatment changes. No new metastatic disease. No acute osseous pathology. IMPRESSION: 1. No acute intra-abdominal or pelvic pathology. No bowel obstruction or active inflammation. Normal appendix. 2. Aortic Atherosclerosis (ICD10-I70.0). 3. Prostate brachytherapy seeds. 4. Sclerotic lesion in the left anterior acetabulum consistent with metastatic disease with probable post treatment changes. No new osseous metastasis. Electronically Signed   By: Anner Crete M.D.   On: 03/01/2019 03:24   Dg Chest 2 View  Result Date: 02/28/2019 CLINICAL DATA:  Weakness EXAM: CHEST - 2 VIEW COMPARISON:  Radiograph 01/10/2019 FINDINGS: Pacer pack overlies the left chest wall with leads in the right atrium and apex. Mild hyperinflation similar to priors. No consolidation, features of edema, pneumothorax, or effusion. Pulmonary vascularity is normally distributed. The cardiomediastinal contours are unremarkable. No acute osseous or soft tissue abnormality. Degenerative changes are present in the imaged spine and shoulders. IMPRESSION: Chronic hyperinflation. No acute cardiopulmonary abnormality. Electronically Signed   By: Lovena Le M.D.   On: 02/28/2019 19:50   Ct Head Wo Contrast  Result Date: 02/28/2019 CLINICAL DATA:  Syncope. EXAM: CT HEAD WITHOUT CONTRAST TECHNIQUE: Contiguous axial images were obtained from the base of the skull through the vertex without intravenous contrast. COMPARISON:  01/10/2019 FINDINGS: Brain: No evidence of acute infarction, hemorrhage, hydrocephalus, extra-axial collection or mass lesion/mass effect. Mild age related atrophy is noted. Vascular: No hyperdense vessel or unexpected calcification. Skull: Normal. Negative for fracture or focal lesion. Sinuses/Orbits: No acute finding. Other: None. IMPRESSION: No acute intracranial abnormality detected. Electronically Signed   By:  Constance Holster M.D.   On: 02/28/2019 20:00        Scheduled Meds:  atorvastatin  20 mg Oral q1800   enoxaparin (LOVENOX) injection  70 mg Subcutaneous Daily   insulin aspart  0-9 Units Subcutaneous Q4H   metoprolol tartrate  12.5 mg Oral Daily   multivitamin with minerals  1 tablet Oral Daily   tamsulosin  0.4 mg Oral QHS   Continuous Infusions:  cefTRIAXone (ROCEPHIN)  IV Stopped (03/01/19 2056)   lactated ringers       LOS: 1 day    Time spent: 64 min    Nicolette Bang, MD [facility]  If 7PM-7AM, please contact night-coverage  03/02/2019, 2:17 PM

## 2019-03-02 NOTE — Progress Notes (Signed)
Upon entering room, pt found to be diaphoretic with gown and bed sheets saturated. VSS. CBG 138. Pt states "this happens at home twice a day and it drains all of my energy." Dr. Wyonia Hough paged and made aware.

## 2019-03-02 NOTE — Progress Notes (Signed)
Replacing K: 7meq PO + 4 runs IV.

## 2019-03-02 NOTE — Plan of Care (Signed)
  Problem: Clinical Measurements: Goal: Ability to maintain clinical measurements within normal limits will improve Outcome: Progressing Goal: Will remain free from infection Outcome: Progressing Goal: Diagnostic test results will improve Outcome: Progressing Goal: Respiratory complications will improve Outcome: Completed/Met Goal: Cardiovascular complication will be avoided Outcome: Progressing   Problem: Activity: Goal: Risk for activity intolerance will decrease Outcome: Progressing   Problem: Nutrition: Goal: Adequate nutrition will be maintained Outcome: Progressing   Problem: Coping: Goal: Level of anxiety will decrease Outcome: Completed/Met   Problem: Elimination: Goal: Will not experience complications related to bowel motility Outcome: Progressing Goal: Will not experience complications related to urinary retention Outcome: Completed/Met   Problem: Pain Managment: Goal: General experience of comfort will improve Outcome: Completed/Met   Problem: Safety: Goal: Ability to remain free from injury will improve Outcome: Progressing   Problem: Skin Integrity: Goal: Risk for impaired skin integrity will decrease Outcome: Progressing

## 2019-03-03 DIAGNOSIS — N179 Acute kidney failure, unspecified: Secondary | ICD-10-CM | POA: Diagnosis not present

## 2019-03-03 DIAGNOSIS — C61 Malignant neoplasm of prostate: Secondary | ICD-10-CM | POA: Diagnosis not present

## 2019-03-03 DIAGNOSIS — E860 Dehydration: Secondary | ICD-10-CM | POA: Diagnosis not present

## 2019-03-03 DIAGNOSIS — I10 Essential (primary) hypertension: Secondary | ICD-10-CM | POA: Diagnosis not present

## 2019-03-03 LAB — GLUCOSE, CAPILLARY
Glucose-Capillary: 107 mg/dL — ABNORMAL HIGH (ref 70–99)
Glucose-Capillary: 109 mg/dL — ABNORMAL HIGH (ref 70–99)
Glucose-Capillary: 203 mg/dL — ABNORMAL HIGH (ref 70–99)

## 2019-03-03 LAB — BASIC METABOLIC PANEL
Anion gap: 10 (ref 5–15)
BUN: 50 mg/dL — ABNORMAL HIGH (ref 8–23)
CO2: 33 mmol/L — ABNORMAL HIGH (ref 22–32)
Calcium: 7.8 mg/dL — ABNORMAL LOW (ref 8.9–10.3)
Chloride: 90 mmol/L — ABNORMAL LOW (ref 98–111)
Creatinine, Ser: 1.99 mg/dL — ABNORMAL HIGH (ref 0.61–1.24)
GFR calc Af Amer: 35 mL/min — ABNORMAL LOW (ref >60)
GFR calc non Af Amer: 31 mL/min — ABNORMAL LOW (ref >60)
Glucose, Bld: 111 mg/dL — ABNORMAL HIGH (ref 70–99)
Potassium: 3.1 mmol/L — ABNORMAL LOW (ref 3.5–5.1)
Sodium: 133 mmol/L — ABNORMAL LOW (ref 135–145)

## 2019-03-03 LAB — CBC WITH DIFFERENTIAL/PLATELET
Abs Immature Granulocytes: 0.03 10*3/uL (ref 0.00–0.07)
Basophils Absolute: 0 10*3/uL (ref 0.0–0.1)
Basophils Relative: 0 %
Eosinophils Absolute: 0.1 10*3/uL (ref 0.0–0.5)
Eosinophils Relative: 2 %
HCT: 30.5 % — ABNORMAL LOW (ref 39.0–52.0)
Hemoglobin: 10.4 g/dL — ABNORMAL LOW (ref 13.0–17.0)
Immature Granulocytes: 0 %
Lymphocytes Relative: 32 %
Lymphs Abs: 3 10*3/uL (ref 0.7–4.0)
MCH: 31.9 pg (ref 26.0–34.0)
MCHC: 34.1 g/dL (ref 30.0–36.0)
MCV: 93.6 fL (ref 80.0–100.0)
Monocytes Absolute: 1.5 10*3/uL — ABNORMAL HIGH (ref 0.1–1.0)
Monocytes Relative: 16 %
Neutro Abs: 4.8 10*3/uL (ref 1.7–7.7)
Neutrophils Relative %: 50 %
Platelets: 195 10*3/uL (ref 150–400)
RBC: 3.26 MIL/uL — ABNORMAL LOW (ref 4.22–5.81)
RDW: 13.7 % (ref 11.5–15.5)
WBC: 9.5 10*3/uL (ref 4.0–10.5)
nRBC: 0 % (ref 0.0–0.2)

## 2019-03-03 LAB — URINE CULTURE: Culture: >=100000 — AB

## 2019-03-03 LAB — PROCALCITONIN: Procalcitonin: <0.1 ng/mL

## 2019-03-03 MED ORDER — CIPROFLOXACIN HCL 500 MG PO TABS: 500.0000 mg | ORAL_TABLET | Freq: Two times a day (BID) | ORAL | 0 refills | Status: AC

## 2019-03-03 MED ORDER — POTASSIUM CHLORIDE CRYS ER 20 MEQ PO TBCR
40.0000 meq | EXTENDED_RELEASE_TABLET | Freq: Once | ORAL | Status: AC
  Administered 2019-03-03: 40 meq via ORAL
  Filled 2019-03-03: qty 2

## 2019-03-03 NOTE — Discharge Summary (Signed)
Physician Discharge Summary  [name] [name] FBP:[id number] DOB: 1936/04/03 DOA: 02/28/2019  PCP: [name], MD  Admit date: 02/28/2019 Discharge date: 03/03/2019  Admitted From: Inpatient Disposition: home  Recommendations for Outpatient Follow-up:  1. Follow up with PCP in 1-2 weeks   Home Health:No Equipment/Devices:none  Discharge Condition:Stable CODE STATUS:Full code Diet recommendation: Regular healthy diet  Brief/Interim Summary: Per admitting history and physical: [name] [name] a 83 y.o.malewith medical history significant ofprostate CA being treated with Gillermina Phy, CKD stage 3, PAF, SSS s/p PPM.  Patient reports loss of appetite with generalized weakness and lethargy, and has had multiple syncopal episodes since last Thursday. Has had admissions in the past for the same. Patient also having some nausea with a couple episodes of nonbloody vomiting, decreased urination and anorexia for 3 days. He denies any abdominal pain or diarrhea. He denies any fevers, chills, cough, shortness of breath, or chest pain. Reports that the syncopal episodes have occurred while standing up to go to the bathroom. He gets lightheaded when sitting up or standing. There has not been any chest pain or palpitations associated with this.  ED Course:Creat 7, up from 2.0. Lactate 4.8 WBC 17k, HGB 18 up from 10.3 last month (15 on admission last month but was so dehydrated was down to 10 by discharge a couple of days later). CXR neg. No fever, no tachycardia. BP 172/85.  Hospital course: Acute kidney injury on CKD stage IIIL Urinary tract infection Paroxysmal A. Fib Hypertension Prostate cancer  This presentation was consistent with dehydration due to poor p.o. intake.  Patient was appropriately fluid resuscitated with improvement in creatinine to near normal.  Patient was placed on strict I's and O's, UA was checked which showed urinary tract infection.  Urine culture showed  sensitivity to antibiotic patient will be discharged on.  His white count is normal his blood pressure is normalized and patient has been afebrile.  He will be discharged home to complete a course of antibiotics.  Patient received instruction on importance of fluid resuscitation and appropriate hydration as an outpatient.  He will also continue his home medications for A. fib, hypertension and follow-up as outpatient provider for continued management of prostate cancer. Patient was noted to have an episode of hyperhidrosis.  Checked TSH and T4.  Marginally elevated, in the setting of acute illness I will not add additional medications.  I did discuss in detail with the patient importance of close follow-up with his PCP for further evaluation as an outpatient.  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   Discharge Diagnoses:  Principal Problem:   Acute renal failure superimposed on stage 3 chronic kidney disease (HCC) Active Problems:   Paroxysmal atrial flutter (HCC)   Essential hypertension, benign   Carcinoma of prostate ([place])   Dehydration   Acute renal failure (ARF) [facility])    Discharge Instructions  Discharge Instructions    Call MD for:  difficulty breathing, headache or visual disturbances   Complete by: As directed    Call MD for:  extreme fatigue   Complete by: As directed    Call MD for:  hives   Complete by: As directed    Call MD for:  persistant dizziness or light-headedness   Complete by: As directed    Call MD for:  persistant nausea and vomiting   Complete by: As directed    Call MD for:  severe uncontrolled pain   Complete by: As directed    Call MD for:  temperature >100.4   Complete by: As directed    Diet - low sodium heart healthy   Complete by: As directed    Increase activity slowly   Complete by: As directed      Allergies as of  03/03/2019      Reactions   Xarelto [rivaroxaban] Other (See Comments)   "I didn't like the way it made me feel" (patient didn't elaborate)      Medication List    TAKE these medications   apixaban 2.5 MG Tabs tablet Commonly known as: Eliquis Take 1 tablet (2.5 mg total) by mouth 2 (two) times daily.   atorvastatin 20 MG tablet Commonly known as: LIPITOR TAKE 1 TABLET BY MOUTH DAILY   metoprolol tartrate 25 MG tablet Commonly known as: LOPRESSOR Take 0.5 tablets (12.5 mg total) by mouth daily.   multivitamin with minerals Tabs tablet Take 1 tablet by mouth daily.   ondansetron 4 MG tablet Commonly known as: ZOFRAN Take 1 tablet (4 mg total) by mouth every 6 (six) hours as needed for nausea.   tamsulosin 0.4 MG Caps capsule Commonly known as: FLOMAX Take 0.4 mg by mouth at bedtime.   Xtandi 40 MG capsule Generic drug: enzalutamide Take 80 mg by mouth 2 (two) times daily.       Allergies  Allergen Reactions  . Xarelto [Rivaroxaban] Other (See Comments)    "I didn't like the way it made me feel" (patient didn't elaborate)    Consultations:  None   Procedures/Studies: Ct Abdomen Pelvis Wo Contrast  Result Date: 03/01/2019 CLINICAL DATA:  83 year old male with nausea and vomiting. Evaluate for bowel obstruction. History of prostate cancer. EXAM: CT ABDOMEN AND PELVIS WITHOUT CONTRAST TECHNIQUE: Multidetector CT imaging of the abdomen and pelvis was performed following the standard protocol without IV contrast. COMPARISON:  CT of the abdomen pelvis dated 02/10/2017 FINDINGS: Evaluation of this exam is limited in the absence of intravenous contrast. Lower chest: Right lung base linear atelectasis/scarring. The visualized lung bases are otherwise clear. Partially visualized pacemaker wire. No intra-abdominal free air or free fluid. Hepatobiliary: Probable small focus of calcified granuloma in the right lobe of the liver. No intrahepatic biliary ductal dilatation. The  gallbladder is unremarkable. Pancreas: Unremarkable. No pancreatic ductal dilatation or surrounding inflammatory changes. Spleen: Normal in size without focal abnormality. Adrenals/Urinary Tract: Mild bilateral adrenal thickening/hyperplasia. The kidneys, visualized ureters, and urinary bladder appear unremarkable. Stomach/Bowel: Postsurgical changes of partial sigmoid resection with anastomotic suture. There is no bowel obstruction or active inflammation. The appendix is normal. Vascular/Lymphatic: Moderate aortoiliac atherosclerotic disease. The IVC is unremarkable. No portal venous gas. There is no adenopathy. Reproductive: Prostate brachytherapy seeds noted. Other: Midline vertical anterior abdominal wall incisional scar. Musculoskeletal: Sclerotic area involving the left pubic bone and anterior column of the left acetabulum. This appears slightly more heterogeneous since the prior CT and may represent post treatment changes. No new metastatic disease. No acute osseous pathology. IMPRESSION: 1. No acute intra-abdominal or pelvic pathology. No bowel obstruction or active inflammation. Normal appendix. 2. Aortic Atherosclerosis (ICD10-I70.0). 3. Prostate brachytherapy seeds. 4. Sclerotic lesion in the left anterior acetabulum consistent with metastatic disease with probable post treatment changes. No new osseous metastasis. Electronically Signed   By: Anner Crete M.D.   On: 03/01/2019 03:24   Dg Chest 2 View  Result Date: 02/28/2019 CLINICAL DATA:  Weakness EXAM: CHEST - 2 VIEW COMPARISON:  Radiograph 01/10/2019 FINDINGS: Pacer pack overlies the left chest wall with leads in the right atrium and apex. Mild hyperinflation similar to priors. No consolidation, features of edema, pneumothorax, or effusion. Pulmonary vascularity is normally distributed. The cardiomediastinal contours are unremarkable. No acute osseous or soft tissue abnormality. Degenerative changes are present in the imaged spine and  shoulders. IMPRESSION: Chronic hyperinflation. No acute cardiopulmonary abnormality. Electronically Signed   By: Lovena Le M.D.   On: 02/28/2019 19:50   Ct Head Wo Contrast  Result Date: 02/28/2019 CLINICAL DATA:  Syncope. EXAM: CT HEAD WITHOUT CONTRAST TECHNIQUE: Contiguous axial images were obtained from the base of the skull through the vertex without intravenous contrast. COMPARISON:  01/10/2019 FINDINGS: Brain: No evidence of acute infarction, hemorrhage, hydrocephalus, extra-axial collection or mass lesion/mass effect. Mild age related atrophy is noted. Vascular: No hyperdense vessel or unexpected calcification. Skull: Normal. Negative for fracture or focal lesion. Sinuses/Orbits: No acute finding. Other: None. IMPRESSION: No acute intracranial abnormality detected. Electronically Signed   By: Constance Holster M.D.   On: 02/28/2019 20:00       Subjective: Patient doing well resting well No acute events overnight Ambulating, tolerating p.o. diet, good p.o. intake  Discharge Exam: Vitals:   03/02/19 2027 03/03/19 0426  BP: (!) 119/48 (!) 149/49  Pulse: (!) 50 (!) 50  Resp: 19 18  Temp: 98.5 F (36.9 C) 98.9 F (37.2 C)  SpO2: 100% 99%   Vitals:   03/02/19 0405 03/02/19 0824 03/02/19 2027 03/03/19 0426  BP: (!) 121/49  (!) 119/48 (!) 149/49  Pulse: (!) 50 65 (!) 50 (!) 50  Resp: 16  19 18   Temp: 98.5 F (36.9 C)  98.5 F (36.9 C) 98.9 F (37.2 C)  TempSrc: Oral  Oral Oral  SpO2: 98%  100% 99%  Weight:      Height:        General: Pt is alert, awake, not in acute distress Cardiovascular: RRR, S1/S2 +, no rubs, no gallops Respiratory: CTA bilaterally, no wheezing, no rhonchi Abdominal: Soft, NT, ND, bowel sounds + Extremities: no edema, no cyanosis    The results of significant diagnostics from this hospitalization (including imaging, microbiology, ancillary and laboratory) are listed below for reference.     Microbiology: Recent Results (from the past 240  hour(s))  SARS CORONAVIRUS 2 (TAT 6-24 HRS) Nasopharyngeal Nasopharyngeal Swab     Status: None   Collection Time: 02/28/19 11:34 PM   Specimen: Nasopharyngeal Swab  Result Value Ref Range Status   SARS Coronavirus 2 NEGATIVE NEGATIVE Final    Comment: (NOTE) SARS-CoV-2 target nucleic acids are NOT DETECTED. The SARS-CoV-2 RNA is generally detectable in upper and lower respiratory specimens during the acute phase of infection. Negative results do not preclude SARS-CoV-2 infection, do not rule out co-infections with other pathogens, and should not be used as the sole basis for treatment or other patient management decisions. Negative results must be combined with clinical observations, patient history, and epidemiological information. The expected result is Negative. Fact Sheet for Patients: SugarRoll.be Fact Sheet for Healthcare Providers: https://www.woods-mathews.com/ This test is not yet approved or cleared by the [place] FDA and  has been authorized for detection and/or diagnosis of SARS-CoV-2 by FDA under an Emergency Use Authorization (EUA). This EUA will remain  in effect (meaning this test can be used) for the duration of the COVID-19 declaration under Section 56 4(b)(1) of the Act, 21 U.S.C. section 360bbb-3(b)(1), unless the authorization is terminated or revoked sooner. Performed at  [facility], [place] [address], [place], [place] [address]   Blood Culture (routine x 2)     Status: None (Preliminary result)   Collection Time: 03/01/19 12:34 AM   Specimen: BLOOD  Result Value Ref Range Status   Specimen Description   Final    BLOOD BLOOD LEFT HAND Performed at [place] [address] [place], [place] [address]    Special Requests   Final    BOTTLES DRAWN AEROBIC AND ANAEROBIC Blood Culture adequate volume Performed at [facility] [address] [place], [place]  [address]    Culture   Final    NO GROWTH 2 DAYS Performed at [place] [address], [place], [place] [address]    Report Status PENDING  Incomplete  Blood Culture (routine x 2)     Status: None (Preliminary result)   Collection Time: 03/01/19 12:34 AM   Specimen: BLOOD  Result Value Ref Range Status   Specimen Description   Final    BLOOD BLOOD LEFT ARM Performed at [facility] [address], [place], [place] [address]    Special Requests   Final    BOTTLES DRAWN AEROBIC ONLY Blood Culture adequate volume Performed at [facility] [address], [place], [place] [address]    Culture   Final    NO GROWTH 2 DAYS Performed at [facility] [address], [place], [place] [address]    Report Status PENDING  Incomplete  Urine culture     Status: Abnormal   Collection Time: 03/01/19  1:01 PM   Specimen: Urine, Clean Catch  Result Value Ref Range Status   Specimen Description   Final    URINE, CLEAN CATCH Performed at [facility], [place] [address], [place], [place] [address]    Special Requests   Final    NONE Performed at [facility], [place] [address], [place],  [address]    Culture >=100,000 COLONIES/mL ESCHERICHIA COLI (A)  Final   Report Status 03/03/2019 FINAL  Final   Organism ID, Bacteria ESCHERICHIA COLI (A)  Final      Susceptibility   Escherichia coli - MIC*    AMPICILLIN >=32 RESISTANT Resistant     CEFAZOLIN <=4 SENSITIVE Sensitive     CEFTRIAXONE <=1 SENSITIVE Sensitive     CIPROFLOXACIN <=0.25 SENSITIVE Sensitive     GENTAMICIN <=1 SENSITIVE Sensitive     IMIPENEM <=0.25 SENSITIVE Sensitive     NITROFURANTOIN <=16 SENSITIVE Sensitive     TRIMETH/SULFA <=20 SENSITIVE Sensitive     AMPICILLIN/SULBACTAM 16 INTERMEDIATE Intermediate     PIP/TAZO <=4 SENSITIVE Sensitive     Extended ESBL NEGATIVE Sensitive     * >=100,000 COLONIES/mL ESCHERICHIA COLI      Labs: BNP (last 3 results) No results for input(s): BNP in the last 8760 hours. Basic Metabolic Panel: Recent Labs  Lab 02/28/19 2244 03/01/19 1228 03/02/19 0410 03/03/19 0422  NA 128* 129* 133* 133*  K 4.0 3.0* 2.5* 3.1*  CL 87* 90* 90* 90*  CO2 14* 23 30 33*  GLUCOSE 179* 328* 132* 111*  BUN 102* 90* 80* 50*  CREATININE 7.16* 4.36* 3.30* 1.99*  CALCIUM 9.2 6.6* 7.6* 7.8*   Liver Function Tests: Recent Labs  Lab 02/28/19 2244  AST 33  ALT 15  ALKPHOS 123  BILITOT 0.9  PROT 9.4*  ALBUMIN 4.7   No results for input(s): LIPASE,  AMYLASE in the last 168 hours. No results for input(s): AMMONIA in the last 168 hours. CBC: Recent Labs  Lab 02/28/19 2244 03/02/19 0410 03/03/19 0422  WBC 17.3* 10.6* 9.5  NEUTROABS 14.6* 6.7 4.8  HGB 18.0* 11.0* 10.4*  HCT 50.7 30.9* 30.5*  MCV 90.1 89.6 93.6  PLT 332 214 195   Cardiac Enzymes: No results for input(s): CKTOTAL, CKMB, CKMBINDEX, TROPONINI in the last 168 hours. BNP: Invalid input(s): POCBNP CBG: Recent Labs  Lab 03/02/19 1536 03/02/19 2023 03/02/19 2353 03/03/19 0421 03/03/19 0736  GLUCAP 138* 142* 120* 107* 109*   D-Dimer No results for input(s): DDIMER in the last 72 hours. Hgb A1c Recent Labs    03/01/19 0034  HGBA1C 6.5*   Lipid Profile No results for input(s): CHOL, HDL, LDLCALC, TRIG, CHOLHDL, LDLDIRECT in the last 72 hours. Thyroid function studies Recent Labs    03/02/19 1600  TSH 0.295*   Anemia work up No results for input(s): VITAMINB12, FOLATE, FERRITIN, TIBC, IRON, RETICCTPCT in the last 72 hours. Urinalysis    Component Value Date/Time   COLORURINE AMBER (A) 03/01/2019 1301   APPEARANCEUR CLOUDY (A) 03/01/2019 1301   LABSPEC 1.012 03/01/2019 1301   PHURINE 5.0 03/01/2019 1301   GLUCOSEU NEGATIVE 03/01/2019 1301   HGBUR LARGE (A) 03/01/2019 1301   BILIRUBINUR NEGATIVE 03/01/2019 1301   KETONESUR NEGATIVE 03/01/2019 1301   PROTEINUR 100 (A) 03/01/2019 1301   UROBILINOGEN  1.0 04/10/2013 0706   NITRITE NEGATIVE 03/01/2019 1301   LEUKOCYTESUR LARGE (A) 03/01/2019 1301   Sepsis Labs Invalid input(s): PROCALCITONIN,  WBC,  LACTICIDVEN Microbiology Recent Results (from the past 240 hour(s))  SARS CORONAVIRUS 2 (TAT 6-24 HRS) Nasopharyngeal Nasopharyngeal Swab     Status: None   Collection Time: 02/28/19 11:34 PM   Specimen: Nasopharyngeal Swab  Result Value Ref Range Status   SARS Coronavirus 2 NEGATIVE NEGATIVE Final    Comment: (NOTE) SARS-CoV-2 target nucleic acids are NOT DETECTED. The SARS-CoV-2 RNA is generally detectable in upper and lower respiratory specimens during the acute phase of infection. Negative results do not preclude SARS-CoV-2 infection, do not rule out co-infections with other pathogens, and should not be used as the sole basis for treatment or other patient management decisions. Negative results must be combined with clinical observations, patient history, and epidemiological information. The expected result is Negative. Fact Sheet for Patients: SugarRoll.be Fact Sheet for Healthcare Providers: https://www.woods-mathews.com/ This test is not yet approved or cleared by the [place] FDA and  has been authorized for detection and/or diagnosis of SARS-CoV-2 by FDA under an Emergency Use Authorization (EUA). This EUA will remain  in effect (meaning this test can be used) for the duration of the COVID-19 declaration under Section 56 4(b)(1) of the Act, 21 U.S.C. section 360bbb-3(b)(1), unless the authorization is terminated or revoked sooner. Performed at [facility], [place] [address] [place], [place] [address]   Blood Culture (routine x 2)     Status: None (Preliminary result)   Collection Time: 03/01/19 12:34 AM   Specimen: BLOOD  Result Value Ref Range Status   Specimen Description   Final    BLOOD BLOOD LEFT HAND Performed at [facility] [address] [place], [place] [address]    Special Requests   Final    BOTTLES DRAWN AEROBIC AND ANAEROBIC Blood Culture adequate volume Performed at [facility] [address] [place],  [address]    Culture   Final  NO GROWTH 2 DAYS Performed at [facility], [place] [address], [place], [place] [address]    Report Status PENDING  Incomplete  Blood Culture (routine x 2)     Status: None (Preliminary result)   Collection Time: 03/01/19 12:34 AM   Specimen: BLOOD  Result Value Ref Range Status   Specimen Description   Final    BLOOD BLOOD LEFT ARM Performed at [facility] [address] [place], [place] [address]    Special Requests   Final    BOTTLES DRAWN AEROBIC ONLY Blood Culture adequate volume Performed at [facility] [address], [place], [place] [address]    Culture   Final    NO GROWTH 2 DAYS Performed at [facility] [address], [place], [place] [address]    Report Status PENDING  Incomplete  Urine culture     Status: Abnormal   Collection Time: 03/01/19  1:01 PM   Specimen: Urine, Clean Catch  Result Value Ref Range Status   Specimen Description   Final    URINE, CLEAN CATCH Performed at [facility], [place] [address] [place], [place] [address]    Special Requests   Final    NONE Performed at [facility], [place] [address] [place], [place] [address]    Culture >=100,000 COLONIES/mL ESCHERICHIA COLI (A)  Final   Report Status 03/03/2019 FINAL  Final   Organism ID, Bacteria ESCHERICHIA COLI (A)  Final      Susceptibility   Escherichia coli - MIC*    AMPICILLIN >=32 RESISTANT Resistant     CEFAZOLIN <=4 SENSITIVE Sensitive     CEFTRIAXONE <=1 SENSITIVE Sensitive     CIPROFLOXACIN <=0.25 SENSITIVE Sensitive     GENTAMICIN <=1 SENSITIVE Sensitive     IMIPENEM <=0.25 SENSITIVE Sensitive     NITROFURANTOIN <=16 SENSITIVE Sensitive     TRIMETH/SULFA  <=20 SENSITIVE Sensitive     AMPICILLIN/SULBACTAM 16 INTERMEDIATE Intermediate     PIP/TAZO <=4 SENSITIVE Sensitive     Extended ESBL NEGATIVE Sensitive     * >=100,000 COLONIES/mL ESCHERICHIA COLI     Time coordinating discharge: Over 30 minutes  SIGNED:   Nicolette Bang, MD  [facility] 03/03/2019, 10:47 AM Pager   If 7PM-7AM, please contact night-coverage [email or web address] Password [email or web address]

## 2019-03-03 NOTE — Progress Notes (Signed)
Patient discharging home.  IV removed - WNL.  Reviewed AVS and medications - emphasized importance of completing dose of abx.  Follow up appointment in place.  Patient verbalizes understanding.  No questions at this time.  Patient is waiting for family to arrive for transport home.  Patient in NAD at this time.  Appointment for labs was scheduled for today with his urologist - appointment cancelled with office per patient request - will not be home in time to arrive.

## 2019-03-03 NOTE — Plan of Care (Signed)
  Problem: Health Behavior/Discharge Planning: Goal: Ability to manage health-related needs will improve Outcome: Progressing   Problem: Clinical Measurements: Goal: Ability to maintain clinical measurements within normal limits will improve Outcome: Progressing Goal: Will remain free from infection Outcome: Progressing Goal: Diagnostic test results will improve Outcome: Progressing Goal: Cardiovascular complication will be avoided Outcome: Progressing   Problem: Activity: Goal: Risk for activity intolerance will decrease Outcome: Progressing   

## 2019-03-06 LAB — CULTURE, BLOOD (ROUTINE X 2)
Culture: NO GROWTH
Culture: NO GROWTH
Special Requests: ADEQUATE
Special Requests: ADEQUATE

## 2019-03-21 ENCOUNTER — Ambulatory Visit: Payer: Medicare Other | Admitting: Cardiology

## 2019-04-24 ENCOUNTER — Encounter (HOSPITAL_COMMUNITY): Payer: Self-pay

## 2019-04-24 ENCOUNTER — Inpatient Hospital Stay (HOSPITAL_COMMUNITY)
Admission: EM | Admit: 2019-04-24 | Discharge: 2019-04-27 | DRG: 683 | Disposition: A | Discharge status: Home / Self Care | Payer: Medicare Other | Attending: Internal Medicine | Admitting: Internal Medicine

## 2019-04-24 ENCOUNTER — Other Ambulatory Visit: Payer: Self-pay

## 2019-04-24 DIAGNOSIS — E872 Acidosis: Secondary | ICD-10-CM | POA: Diagnosis present

## 2019-04-24 DIAGNOSIS — Z7901 Long term (current) use of anticoagulants: Secondary | ICD-10-CM

## 2019-04-24 DIAGNOSIS — R001 Bradycardia, unspecified: Secondary | ICD-10-CM | POA: Diagnosis present

## 2019-04-24 DIAGNOSIS — R5381 Other malaise: Secondary | ICD-10-CM | POA: Diagnosis present

## 2019-04-24 DIAGNOSIS — Z950 Presence of cardiac pacemaker: Secondary | ICD-10-CM

## 2019-04-24 DIAGNOSIS — N179 Acute kidney failure, unspecified: Principal | ICD-10-CM | POA: Diagnosis present

## 2019-04-24 DIAGNOSIS — Z87891 Personal history of nicotine dependence: Secondary | ICD-10-CM

## 2019-04-24 DIAGNOSIS — I4892 Unspecified atrial flutter: Secondary | ICD-10-CM | POA: Diagnosis present

## 2019-04-24 DIAGNOSIS — E860 Dehydration: Secondary | ICD-10-CM | POA: Diagnosis not present

## 2019-04-24 DIAGNOSIS — I129 Hypertensive chronic kidney disease with stage 1 through stage 4 chronic kidney disease, or unspecified chronic kidney disease: Secondary | ICD-10-CM | POA: Diagnosis present

## 2019-04-24 DIAGNOSIS — I480 Paroxysmal atrial fibrillation: Secondary | ICD-10-CM | POA: Diagnosis present

## 2019-04-24 DIAGNOSIS — Z806 Family history of leukemia: Secondary | ICD-10-CM

## 2019-04-24 DIAGNOSIS — R112 Nausea with vomiting, unspecified: Secondary | ICD-10-CM | POA: Diagnosis present

## 2019-04-24 DIAGNOSIS — N184 Chronic kidney disease, stage 4 (severe): Secondary | ICD-10-CM | POA: Diagnosis present

## 2019-04-24 DIAGNOSIS — Z20828 Contact with and (suspected) exposure to other viral communicable diseases: Secondary | ICD-10-CM | POA: Diagnosis present

## 2019-04-24 DIAGNOSIS — Z8546 Personal history of malignant neoplasm of prostate: Secondary | ICD-10-CM

## 2019-04-24 DIAGNOSIS — I10 Essential (primary) hypertension: Secondary | ICD-10-CM | POA: Diagnosis present

## 2019-04-24 DIAGNOSIS — E785 Hyperlipidemia, unspecified: Secondary | ICD-10-CM | POA: Diagnosis present

## 2019-04-24 LAB — COMPREHENSIVE METABOLIC PANEL
ALT: 19 U/L (ref 0–44)
AST: 36 U/L (ref 15–41)
Albumin: 5.6 g/dL — ABNORMAL HIGH (ref 3.5–5.0)
Alkaline Phosphatase: 109 U/L (ref 38–126)
Anion gap: 25 — ABNORMAL HIGH (ref 5–15)
BUN: 89 mg/dL — ABNORMAL HIGH (ref 8–23)
CO2: 19 mmol/L — ABNORMAL LOW (ref 22–32)
Calcium: 10.1 mg/dL (ref 8.9–10.3)
Chloride: 92 mmol/L — ABNORMAL LOW (ref 98–111)
Creatinine, Ser: 6.1 mg/dL — ABNORMAL HIGH (ref 0.61–1.24)
GFR calc Af Amer: 9 mL/min — ABNORMAL LOW (ref >60)
GFR calc non Af Amer: 8 mL/min — ABNORMAL LOW (ref >60)
Glucose, Bld: 231 mg/dL — ABNORMAL HIGH (ref 70–99)
Potassium: 4.1 mmol/L (ref 3.5–5.1)
Sodium: 136 mmol/L (ref 135–145)
Total Bilirubin: 1.2 mg/dL (ref 0.3–1.2)
Total Protein: 10.2 g/dL — ABNORMAL HIGH (ref 6.5–8.1)

## 2019-04-24 LAB — CBC WITH DIFFERENTIAL/PLATELET
Abs Immature Granulocytes: 0.05 10*3/uL (ref 0.00–0.07)
Basophils Absolute: 0 10*3/uL (ref 0.0–0.1)
Basophils Relative: 0 %
Eosinophils Absolute: 0 10*3/uL (ref 0.0–0.5)
Eosinophils Relative: 0 %
HCT: 56 % — ABNORMAL HIGH (ref 39.0–52.0)
Hemoglobin: 19.1 g/dL — ABNORMAL HIGH (ref 13.0–17.0)
Immature Granulocytes: 1 %
Lymphocytes Relative: 19 %
Lymphs Abs: 1.8 10*3/uL (ref 0.7–4.0)
MCH: 31.6 pg (ref 26.0–34.0)
MCHC: 34.1 g/dL (ref 30.0–36.0)
MCV: 92.7 fL (ref 80.0–100.0)
Monocytes Absolute: 0.5 10*3/uL (ref 0.1–1.0)
Monocytes Relative: 5 %
Neutro Abs: 7.1 10*3/uL (ref 1.7–7.7)
Neutrophils Relative %: 75 %
Platelets: 334 10*3/uL (ref 150–400)
RBC: 6.04 MIL/uL — ABNORMAL HIGH (ref 4.22–5.81)
RDW: 13.7 % (ref 11.5–15.5)
WBC: 9.4 10*3/uL (ref 4.0–10.5)
nRBC: 0 % (ref 0.0–0.2)

## 2019-04-24 LAB — LIPASE, BLOOD: Lipase: 66 U/L — ABNORMAL HIGH (ref 11–51)

## 2019-04-24 MED ORDER — METOPROLOL TARTRATE 5 MG/5ML IV SOLN
5.0000 mg | Freq: Once | INTRAVENOUS | Status: AC
  Administered 2019-04-24: 5 mg via INTRAVENOUS
  Filled 2019-04-24: qty 5

## 2019-04-24 MED ORDER — SODIUM CHLORIDE 0.9 % IV BOLUS
1000.0000 mL | Freq: Once | INTRAVENOUS | Status: AC
  Administered 2019-04-24: 1000 mL via INTRAVENOUS

## 2019-04-24 MED ORDER — SODIUM CHLORIDE 0.9 % IV BOLUS
1000.0000 mL | Freq: Once | INTRAVENOUS | Status: AC
  Administered 2019-04-24: 22:00:00 1000 mL via INTRAVENOUS

## 2019-04-24 MED ORDER — ONDANSETRON HCL 4 MG/2ML IJ SOLN: 4.0000 mg | Freq: Four times a day (QID) | INTRAMUSCULAR | Status: DC | PRN

## 2019-04-24 MED ORDER — SODIUM CHLORIDE 0.9 % IV SOLN: INTRAVENOUS | Status: DC

## 2019-04-24 MED ORDER — ACETAMINOPHEN 325 MG PO TABS: 650.0000 mg | ORAL_TABLET | Freq: Four times a day (QID) | ORAL | Status: DC | PRN

## 2019-04-24 MED ORDER — ONDANSETRON HCL 4 MG PO TABS: 4.0000 mg | ORAL_TABLET | Freq: Four times a day (QID) | ORAL | Status: DC | PRN

## 2019-04-24 MED ORDER — ACETAMINOPHEN 650 MG RE SUPP: 650.0000 mg | Freq: Four times a day (QID) | RECTAL | Status: DC | PRN

## 2019-04-24 MED ORDER — LABETALOL HCL 5 MG/ML IV SOLN: 10.0000 mg | Freq: Three times a day (TID) | INTRAVENOUS | Status: DC | PRN

## 2019-04-24 NOTE — ED Triage Notes (Signed)
Patient arrived from home with complaints of nausea and vomiting since Thursday. Complaints of generalized weakness since. Patient has a history of prostate cancer.

## 2019-04-24 NOTE — ED Provider Notes (Signed)
[facility] DEPT Provider Note   CSN: [id number] Arrival date & time: 04/24/19  2028     History Chief Complaint  Patient presents with  . Emesis    [name] [name] is a 83 y.o. male.  83 year old male with history of prostate cancer as well as paroxysmal A. fib presents with nausea vomiting times several days.  He is currently taking oral chemotherapeutic agents.  Denies any fever or chills.  No abdominal discomfort.  No black or bloody stools.  Had an admission to the hospital 2 months ago for AKI along with A. fib.  He does take Eliquis.  Patient states that his emesis has been nonbilious or bloody.  He notes weakness worse when he tries to stand up.  No treatment use prior to arrival        Past Medical History:  Diagnosis Date  . AKI (acute kidney injury) ([place]) 06/08/2018  . Atrial flutter ([place])   . Carcinoma of prostate ([place]) 12/30/2011   Treated with seed/radiation therapy.   . Colostomy in place [facility])   . Difficulty sleeping    lives in shelter currently  . Diverticulitis   . Dyslipidemia 12/30/2011  . Encounter for care of pacemaker 06/17/2018  . Frequency of urination   . Heart murmur   . Hypertension 04/30/11   Cardioversion 06/16/11  . Near syncope 06/07/2018  . Nocturia   . Orthostatic hypotension 06/08/2018  . Pacemaker 7/13    sick sinus syndrome/St Jude pacemaker  . Paroxysmal atrial flutter ([place]) 06/16/2011  . Prostate cancer ([place])   . Sick sinus syndrome ([place]) 11/28/2011  . Syncope and collapse 11/20/2011   Atrial and ventricular standstill > 3 seconds.   . Thyroid disease    had "overactive thyroid in 1997" - no known problem since    Patient Active Problem List   Diagnosis Date Noted  . Dehydration 03/01/2019  . Acute renal failure (ARF) ([place]) 03/01/2019  . Orthostatic syncope 01/11/2019  . Acute renal failure superimposed on stage 3 chronic kidney disease ([place]) 01/11/2019  . Hyponatremia 01/11/2019  . Hypokalemia  01/11/2019  . Syncope 10/27/2018  . Encounter for care of pacemaker 06/17/2018  . Diverticular disease 05/13/2013  . Renal insufficiency 12/31/2011  . Dyslipidemia 12/30/2011  . Carcinoma of prostate ([place]) 12/30/2011  . Pacemaker-St.Jude Accent DR-RF dual chamber pacemaker  12/03/2011  . Sick sinus syndrome ([place]) 11/28/2011  . Paroxysmal atrial flutter ([place]) 06/16/2011  . Essential hypertension, benign 06/16/2011    Past Surgical History:  Procedure Laterality Date  . ATRIAL FLUTTER ABLATION N/A 11/21/2011   Procedure: ATRIAL FLUTTER ABLATION;  Surgeon: Thompson Grayer, MD;  Location: [facility] CATH LAB;  Service: Cardiovascular;  Laterality: N/A;  . CARDIAC ELECTROPHYSIOLOGY STUDY AND ABLATION  7/13  . CARDIOVERSION  06/16/2011   Procedure: CARDIOVERSION;  Surgeon: Laverda Page, MD;  Location: [facility];  Service: Cardiovascular;  Laterality: N/A;  . COLON SURGERY  05/13/2013  . COLOSTOMY CLOSURE N/A 11/25/2013   Procedure: COLOSTOMY CLOSURE;  Surgeon: Earnstine Regal, MD;  Location: [facility] ORS;  Service: General;  Laterality: N/A;  . COLOSTOMY CLOSURE  11/25/2013  . PACEMAKER INSERTION  11/28/11   SJM Accent DR RF implanted by Dr Rayann Heman  . PARTIAL COLECTOMY N/A 05/13/2013   Procedure: sigmoid COLECTOMY  colostomy ;  Surgeon: Earnstine Regal, MD;  Location: [facility] ORS;  Service: General;  Laterality: N/A;  . PERMANENT PACEMAKER INSERTION N/A 11/28/2011   Procedure: PERMANENT PACEMAKER INSERTION;  Surgeon: Thompson Grayer,  MD;  Location: [facility] CATH LAB;  Service: Cardiovascular;  Laterality: N/A;  . RADIOACTIVE SEED IMPLANT    . TONSILLECTOMY         Family History  Problem Relation Age of Onset  . Leukemia Mother   . Dementia Father     Social History   Tobacco Use  . Smoking status: Former Smoker    Packs/day: 0.50    Years: 50.00    Pack years: 25.00    Types: Cigarettes    Quit date: 07/20/2009    Years since quitting: 9.7  . Smokeless tobacco: Never Used  Substance Use Topics  . Alcohol use: No    . Drug use: No    Home Medications Prior to Admission medications   Medication Sig Start Date End Date Taking? Authorizing Provider  apixaban (ELIQUIS) 2.5 MG TABS tablet Take 1 tablet (2.5 mg total) by mouth 2 (two) times daily. 10/29/18   Mercy Riding, MD  atorvastatin (LIPITOR) 20 MG tablet TAKE 1 TABLET BY MOUTH DAILY Patient taking differently: Take 20 mg by mouth daily.  01/03/19   Adrian Prows, MD  metoprolol tartrate (LOPRESSOR) 25 MG tablet Take 0.5 tablets (12.5 mg total) by mouth daily. 01/14/19   Georgette Shell, MD  Multiple Vitamin (MULTIVITAMIN WITH MINERALS) TABS tablet Take 1 tablet by mouth daily. 10/29/18   Mercy Riding, MD  ondansetron (ZOFRAN) 4 MG tablet Take 1 tablet (4 mg total) by mouth every 6 (six) hours as needed for nausea. 01/14/19   Georgette Shell, MD  Tamsulosin HCl (FLOMAX) 0.4 MG CAPS Take 0.4 mg by mouth at bedtime.     [provider]  XTANDI 40 MG capsule Take 80 mg by mouth 2 (two) times daily.  05/26/18   [provider]    Allergies    Xarelto [rivaroxaban]  Review of Systems   Review of Systems  All other systems reviewed and are negative.   Physical Exam Updated Vital Signs BP (!) 153/101 (BP Location: Left Arm)   Pulse 89   Temp 97.8 F (36.6 C) (Oral)   Resp (!) 22   SpO2 99%   Physical Exam Vitals and nursing note reviewed.  Constitutional:      General: He is not in acute distress.    Appearance: Normal appearance. He is well-developed. He is not toxic-appearing.  HENT:     Head: Normocephalic and atraumatic.  Eyes:     General: Lids are normal.     Conjunctiva/sclera: Conjunctivae normal.     Pupils: Pupils are equal, round, and reactive to light.  Neck:     Thyroid: No thyroid mass.     Trachea: No tracheal deviation.  Cardiovascular:     Rate and Rhythm: Tachycardia present. Rhythm irregular.     Heart sounds: Normal heart sounds. No murmur. No gallop.   Pulmonary:     Effort: Pulmonary effort  is normal. No respiratory distress.     Breath sounds: Normal breath sounds. No stridor. No decreased breath sounds, wheezing, rhonchi or rales.  Abdominal:     General: Bowel sounds are normal. There is no distension.     Palpations: Abdomen is soft.     Tenderness: There is no abdominal tenderness. There is no rebound.  Musculoskeletal:        General: No tenderness. Normal range of motion.     Cervical back: Normal range of motion and neck supple.  Skin:    General: Skin is warm and  dry.     Findings: No abrasion or rash.  Neurological:     Mental Status: He is alert and oriented to person, place, and time.     GCS: GCS eye subscore is 4. GCS verbal subscore is 5. GCS motor subscore is 6.     Cranial Nerves: No cranial nerve deficit.     Sensory: No sensory deficit.  Psychiatric:        Speech: Speech normal.        Behavior: Behavior normal.     ED Results / Procedures / Treatments   Labs (all labs ordered are listed, but only abnormal results are displayed) Labs Reviewed  URINE CULTURE  CBC WITH DIFFERENTIAL/PLATELET  COMPREHENSIVE METABOLIC PANEL  LIPASE, BLOOD  URINALYSIS, ROUTINE W REFLEX MICROSCOPIC    EKG EKG Interpretation  Date/Time:  Sunday April 24 2019 21:09:00 EST Ventricular Rate:  174 PR Interval:    QRS Duration: 172 QT Interval:  257 QTC Calculation: 438 R Axis:   -48 Text Interpretation: Extreme tachycardia with wide complex, no further rhythm analysis attempted Confirmed by Lacretia Leigh ([id number]) on 04/24/2019 9:37:58 PM   Radiology No results found.  Procedures Procedures (including critical care time)  Medications Ordered in ED Medications  0.9 %  sodium chloride infusion (has no administration in time range)  metoprolol tartrate (LOPRESSOR) injection 5 mg (has no administration in time range)  sodium chloride 0.9 % bolus 1,000 mL (has no administration in time range)    ED Course  I have reviewed the triage vital signs and the  nursing notes.  Pertinent labs & imaging results that were available during my care of the patient were reviewed by me and considered in my medical decision making (see chart for details).    MDM Rules/Calculators/A&P                      Patient given Lopressor 5 mg IV push due to his paroxysmal A. fib.  Heart rate improved greatly and he was also given IV fluids.  Patient has evidence of severe dehydration with a BUN of 89 and creatinine of 6.  His potassium is 4.1.  Will admit to the hospitalist service.   CRITICAL CARE Performed by: Leota Jacobsen Total critical care time: 60 minutes Critical care time was exclusive of separately billable procedures and treating other patients. Critical care was necessary to treat or prevent imminent or life-threatening deterioration. Critical care was time spent personally by me on the following activities: development of treatment plan with patient and/or surrogate as well as nursing, discussions with consultants, evaluation of patient's response to treatment, examination of patient, obtaining history from patient or surrogate, ordering and performing treatments and interventions, ordering and review of laboratory studies, ordering and review of radiographic studies, pulse oximetry and re-evaluation of patient's condition.  Final Clinical Impression(s) / ED Diagnoses Final diagnoses:  None    Rx / DC Orders ED Discharge Orders    None       Lacretia Leigh, MD 04/24/19 2228

## 2019-04-25 ENCOUNTER — Encounter (HOSPITAL_COMMUNITY): Payer: Self-pay | Admitting: Internal Medicine

## 2019-04-25 DIAGNOSIS — Z7901 Long term (current) use of anticoagulants: Secondary | ICD-10-CM | POA: Diagnosis not present

## 2019-04-25 DIAGNOSIS — Z20828 Contact with and (suspected) exposure to other viral communicable diseases: Secondary | ICD-10-CM | POA: Diagnosis present

## 2019-04-25 DIAGNOSIS — N179 Acute kidney failure, unspecified: Secondary | ICD-10-CM | POA: Diagnosis present

## 2019-04-25 DIAGNOSIS — R001 Bradycardia, unspecified: Secondary | ICD-10-CM | POA: Diagnosis present

## 2019-04-25 DIAGNOSIS — N183 Chronic kidney disease, stage 3 unspecified: Secondary | ICD-10-CM | POA: Diagnosis not present

## 2019-04-25 DIAGNOSIS — I10 Essential (primary) hypertension: Secondary | ICD-10-CM | POA: Diagnosis not present

## 2019-04-25 DIAGNOSIS — E785 Hyperlipidemia, unspecified: Secondary | ICD-10-CM | POA: Diagnosis present

## 2019-04-25 DIAGNOSIS — I129 Hypertensive chronic kidney disease with stage 1 through stage 4 chronic kidney disease, or unspecified chronic kidney disease: Secondary | ICD-10-CM | POA: Diagnosis present

## 2019-04-25 DIAGNOSIS — E860 Dehydration: Secondary | ICD-10-CM | POA: Diagnosis present

## 2019-04-25 DIAGNOSIS — R112 Nausea with vomiting, unspecified: Secondary | ICD-10-CM | POA: Diagnosis not present

## 2019-04-25 DIAGNOSIS — I4892 Unspecified atrial flutter: Secondary | ICD-10-CM | POA: Diagnosis present

## 2019-04-25 DIAGNOSIS — E872 Acidosis: Secondary | ICD-10-CM | POA: Diagnosis present

## 2019-04-25 DIAGNOSIS — Z8546 Personal history of malignant neoplasm of prostate: Secondary | ICD-10-CM | POA: Diagnosis not present

## 2019-04-25 DIAGNOSIS — Z806 Family history of leukemia: Secondary | ICD-10-CM | POA: Diagnosis not present

## 2019-04-25 DIAGNOSIS — Z87891 Personal history of nicotine dependence: Secondary | ICD-10-CM | POA: Diagnosis not present

## 2019-04-25 DIAGNOSIS — Z950 Presence of cardiac pacemaker: Secondary | ICD-10-CM | POA: Diagnosis not present

## 2019-04-25 DIAGNOSIS — I480 Paroxysmal atrial fibrillation: Secondary | ICD-10-CM | POA: Diagnosis present

## 2019-04-25 DIAGNOSIS — N184 Chronic kidney disease, stage 4 (severe): Secondary | ICD-10-CM | POA: Diagnosis present

## 2019-04-25 DIAGNOSIS — R5381 Other malaise: Secondary | ICD-10-CM | POA: Diagnosis present

## 2019-04-25 LAB — URINALYSIS, ROUTINE W REFLEX MICROSCOPIC
Bacteria, UA: NONE SEEN
Bilirubin Urine: NEGATIVE
Glucose, UA: NEGATIVE mg/dL
Hgb urine dipstick: NEGATIVE
Ketones, ur: NEGATIVE mg/dL
Leukocytes,Ua: NEGATIVE
Nitrite: NEGATIVE
Protein, ur: 30 mg/dL — AB
Specific Gravity, Urine: 1.017 (ref 1.005–1.030)
pH: 5 (ref 5.0–8.0)

## 2019-04-25 LAB — SARS CORONAVIRUS 2 (TAT 6-24 HRS): SARS Coronavirus 2: NEGATIVE

## 2019-04-25 LAB — COMPREHENSIVE METABOLIC PANEL
ALT: 14 U/L (ref 0–44)
AST: 50 U/L — ABNORMAL HIGH (ref 15–41)
Albumin: 3.8 g/dL (ref 3.5–5.0)
Alkaline Phosphatase: 78 U/L (ref 38–126)
Anion gap: 16 — ABNORMAL HIGH (ref 5–15)
BUN: 84 mg/dL — ABNORMAL HIGH (ref 8–23)
CO2: 16 mmol/L — ABNORMAL LOW (ref 22–32)
Calcium: 8.2 mg/dL — ABNORMAL LOW (ref 8.9–10.3)
Chloride: 101 mmol/L (ref 98–111)
Creatinine, Ser: 4.82 mg/dL — ABNORMAL HIGH (ref 0.61–1.24)
GFR calc Af Amer: 12 mL/min — ABNORMAL LOW (ref >60)
GFR calc non Af Amer: 10 mL/min — ABNORMAL LOW (ref >60)
Glucose, Bld: 139 mg/dL — ABNORMAL HIGH (ref 70–99)
Potassium: 5.5 mmol/L — ABNORMAL HIGH (ref 3.5–5.1)
Sodium: 133 mmol/L — ABNORMAL LOW (ref 135–145)
Total Bilirubin: 0.8 mg/dL (ref 0.3–1.2)
Total Protein: 7.3 g/dL (ref 6.5–8.1)

## 2019-04-25 LAB — CBC
HCT: 44.6 % (ref 39.0–52.0)
Hemoglobin: 14.5 g/dL (ref 13.0–17.0)
MCH: 30.4 pg (ref 26.0–34.0)
MCHC: 32.5 g/dL (ref 30.0–36.0)
MCV: 93.5 fL (ref 80.0–100.0)
Platelets: 299 10*3/uL (ref 150–400)
RBC: 4.77 MIL/uL (ref 4.22–5.81)
RDW: 13.3 % (ref 11.5–15.5)
WBC: 10.2 10*3/uL (ref 4.0–10.5)
nRBC: 0 % (ref 0.0–0.2)

## 2019-04-25 MED ORDER — BOOST / RESOURCE BREEZE PO LIQD CUSTOM
1.0000 | Freq: Two times a day (BID) | ORAL | Status: DC
  Administered 2019-04-25 – 2019-04-26 (×3): 1 via ORAL

## 2019-04-25 MED ORDER — METOPROLOL TARTRATE 25 MG PO TABS
12.5000 mg | ORAL_TABLET | Freq: Every day | ORAL | Status: DC
  Administered 2019-04-25 – 2019-04-27 (×2): 12.5 mg via ORAL
  Filled 2019-04-25 (×3): qty 1

## 2019-04-25 MED ORDER — PRO-STAT SUGAR FREE PO LIQD
30.0000 mL | Freq: Two times a day (BID) | ORAL | Status: DC
  Administered 2019-04-25 – 2019-04-27 (×5): 30 mL via ORAL
  Filled 2019-04-25 (×5): qty 30

## 2019-04-25 MED ORDER — TAMSULOSIN HCL 0.4 MG PO CAPS
0.4000 mg | ORAL_CAPSULE | Freq: Every day | ORAL | Status: DC
  Administered 2019-04-25 – 2019-04-26 (×3): 0.4 mg via ORAL
  Filled 2019-04-25 (×3): qty 1

## 2019-04-25 MED ORDER — APIXABAN 2.5 MG PO TABS
2.5000 mg | ORAL_TABLET | Freq: Two times a day (BID) | ORAL | Status: DC
  Administered 2019-04-25 – 2019-04-27 (×6): 2.5 mg via ORAL
  Filled 2019-04-25 (×6): qty 1

## 2019-04-25 MED ORDER — ADULT MULTIVITAMIN W/MINERALS CH
1.0000 | ORAL_TABLET | Freq: Every day | ORAL | Status: DC
  Administered 2019-04-25 – 2019-04-27 (×3): 1 via ORAL
  Filled 2019-04-25 (×3): qty 1

## 2019-04-25 NOTE — H&P (Signed)
History and Physical    Evangelos Paulino JME:[id number] DOB: Aug 20, 1935 DOA: 04/24/2019  PCP: [name], MD   Patient coming from: Home    Chief Complaint: Nausea and vomiting  HPI: [name] [name] is a 83 y.o. male with medical history significant of hypertension, hyperlipidemia, paroxysmal atrial flutter on Eliquis, prostate cancer and chronic renal failure presented to ED for evaluation of worsening nausea and vomiting and generalized weakness.  Patient states that he is having nausea and vomiting for the last 3 days and the condition continue to worsen and he is unable to eat and drink therefore he is feeling very weak.  Patient states that he is having vomiting for multiple times today.  Patient admits of having weakness and sweating but denies fever, chills, headache, dizziness, cough, loss of taste and smell sensation, abdominal pain, urinary symptoms and anxiety.  Patient admits of having similar episodes in the past.  ED Course: In ED, patient had blood pressure of 177/101, heart rate 89, temperature 97.8, respiratory rate 15 and oxygen saturation 99% on room air.  EKG showed tachycardia with wide complexes but no acute ST and T wave changes.  Patient was also found to have elevated creatinine of 6.1 whereas the baseline creatinine is 1.9 according to the medical records.  In the ED patient was managed with IV Lopressor 5 mg for tachycardia and also started on bolus of normal saline for dehydration.  Review of Systems: As per HPI otherwise 10 point review of systems negative.    Past Medical History:  Diagnosis Date  . AKI (acute kidney injury) ([place]) 06/08/2018  . Atrial flutter ([place])   . Carcinoma of prostate ([place]) 12/30/2011   Treated with seed/radiation therapy.   . Colostomy in place [facility])   . Difficulty sleeping    lives in shelter currently  . Diverticulitis   . Dyslipidemia 12/30/2011  . Encounter for care of pacemaker 06/17/2018  . Frequency of urination   . Heart murmur     . Hypertension 04/30/11   Cardioversion 06/16/11  . Near syncope 06/07/2018  . Nocturia   . Orthostatic hypotension 06/08/2018  . Pacemaker 7/13    sick sinus syndrome/St Jude pacemaker  . Paroxysmal atrial flutter ([place]) 06/16/2011  . Prostate cancer ([place])   . Sick sinus syndrome ([place]) 11/28/2011  . Syncope and collapse 11/20/2011   Atrial and ventricular standstill > 3 seconds.   . Thyroid disease    had "overactive thyroid in 1997" - no known problem since    Past Surgical History:  Procedure Laterality Date  . ATRIAL FLUTTER ABLATION N/A 11/21/2011   Procedure: ATRIAL FLUTTER ABLATION;  Surgeon: Thompson Grayer, MD;  Location: [facility] CATH LAB;  Service: Cardiovascular;  Laterality: N/A;  . CARDIAC ELECTROPHYSIOLOGY STUDY AND ABLATION  7/13  . CARDIOVERSION  06/16/2011   Procedure: CARDIOVERSION;  Surgeon: Laverda Page, MD;  Location: [facility];  Service: Cardiovascular;  Laterality: N/A;  . COLON SURGERY  05/13/2013  . COLOSTOMY CLOSURE N/A 11/25/2013   Procedure: COLOSTOMY CLOSURE;  Surgeon: Earnstine Regal, MD;  Location: [facility] ORS;  Service: General;  Laterality: N/A;  . COLOSTOMY CLOSURE  11/25/2013  . PACEMAKER INSERTION  11/28/11   SJM Accent DR RF implanted by Dr Rayann Heman  . PARTIAL COLECTOMY N/A 05/13/2013   Procedure: sigmoid COLECTOMY  colostomy ;  Surgeon: Earnstine Regal, MD;  Location: [facility] ORS;  Service: General;  Laterality: N/A;  . PERMANENT PACEMAKER INSERTION N/A 11/28/2011   Procedure: PERMANENT PACEMAKER INSERTION;  Surgeon: Thompson Grayer, MD;  Location: [facility] CATH LAB;  Service: Cardiovascular;  Laterality: N/A;  . RADIOACTIVE SEED IMPLANT    . TONSILLECTOMY       reports that he quit smoking about 9 years ago. His smoking use included cigarettes. He has a 25.00 pack-year smoking history. He has never used smokeless tobacco. He reports that he does not drink alcohol or use drugs.  Allergies  Allergen Reactions  . Xarelto [Rivaroxaban] Other (See Comments)    "I didn't like the way it  made me feel" (patient didn't elaborate)    Family History  Problem Relation Age of Onset  . Leukemia Mother   . Dementia Father      Prior to Admission medications   Medication Sig Start Date End Date Taking? Authorizing Provider  apixaban (ELIQUIS) 2.5 MG TABS tablet Take 1 tablet (2.5 mg total) by mouth 2 (two) times daily. 10/29/18  Yes Mercy Riding, MD  atorvastatin (LIPITOR) 20 MG tablet TAKE 1 TABLET BY MOUTH DAILY Patient taking differently: Take 20 mg by mouth daily.  01/03/19  Yes Adrian Prows, MD  metoprolol tartrate (LOPRESSOR) 25 MG tablet Take 0.5 tablets (12.5 mg total) by mouth daily. 01/14/19  Yes Georgette Shell, MD  Multiple Vitamin (MULTIVITAMIN WITH MINERALS) TABS tablet Take 1 tablet by mouth daily. 10/29/18  Yes Mercy Riding, MD  Tamsulosin HCl (FLOMAX) 0.4 MG CAPS Take 0.4 mg by mouth at bedtime.    Yes [provider]  XTANDI 40 MG capsule Take 80 mg by mouth 2 (two) times daily.  05/26/18  Yes [provider]  ondansetron (ZOFRAN) 4 MG tablet Take 1 tablet (4 mg total) by mouth every 6 (six) hours as needed for nausea. Patient not taking: Reported on 04/24/2019 01/14/19   Georgette Shell, MD    Physical Exam: Vitals:   04/24/19 2300 04/24/19 2330 04/25/19 0001 04/25/19 0100  BP: (!) 177/103 (!) 155/132 (!) 154/79 (!) 160/88  Pulse: 63 (!) 50 68 (!) 47  Resp: 15 16 14 17   Temp:      TempSrc:      SpO2: 98% 98% 98% 100%    Constitutional: NAD, calm, comfortable Vitals:   04/24/19 2300 04/24/19 2330 04/25/19 0001 04/25/19 0100  BP: (!) 177/103 (!) 155/132 (!) 154/79 (!) 160/88  Pulse: 63 (!) 50 68 (!) 47  Resp: 15 16 14 17   Temp:      TempSrc:      SpO2: 98% 98% 98% 100%   Eyes: PERRL, lids and conjunctivae normal ENMT: Mucous membranes are dry. Posterior pharynx clear of any exudate or lesions.Normal dentition.  Neck: normal, supple, no masses, no thyromegaly Respiratory: clear to auscultation bilaterally, no wheezing, no  crackles. Normal respiratory effort. No accessory muscle use.  Cardiovascular: Chest  is nontender on palpation.  Tachycardia with regular rhythm at this time although the patient has history of paroxysmal atrial flutter.No murmurs / rubs / gallops. No extremity edema. 2+ pedal pulses. No carotid bruits.  Abdomen: no tenderness, no masses palpated. No hepatosplenomegaly. Bowel sounds positive.  Musculoskeletal: no clubbing / cyanosis. No joint deformity upper and lower extremities. Good ROM, no contractures. Normal muscle tone.  Skin: no rashes, lesions, ulcers. No induration Neurologic: CN 2-12 grossly intact. Sensation intact, DTR normal. Strength 5/5 in all 4.  Psychiatric: Normal judgment and insight. Alert and oriented x 3. Normal mood.   Labs on Admission: I have personally reviewed following labs and imaging studies  CBC: Recent  Labs  Lab 04/24/19 2122  WBC 9.4  NEUTROABS 7.1  HGB 19.1*  HCT 56.0*  MCV 92.7  PLT 161   Basic Metabolic Panel: Recent Labs  Lab 04/24/19 2122  NA 136  K 4.1  CL 92*  CO2 19*  GLUCOSE 231*  BUN 89*  CREATININE 6.10*  CALCIUM 10.1   GFR: CrCl cannot be calculated (Unknown ideal weight.). Liver Function Tests: Recent Labs  Lab 04/24/19 2122  AST 36  ALT 19  ALKPHOS 109  BILITOT 1.2  PROT 10.2*  ALBUMIN 5.6*   Recent Labs  Lab 04/24/19 2122  LIPASE 66*   No results for input(s): AMMONIA in the last 168 hours. Coagulation Profile: No results for input(s): INR, PROTIME in the last 168 hours. Cardiac Enzymes: No results for input(s): CKTOTAL, CKMB, CKMBINDEX, TROPONINI in the last 168 hours. BNP (last 3 results) No results for input(s): PROBNP in the last 8760 hours. HbA1C: No results for input(s): HGBA1C in the last 72 hours. CBG: No results for input(s): GLUCAP in the last 168 hours. Lipid Profile: No results for input(s): CHOL, HDL, LDLCALC, TRIG, CHOLHDL, LDLDIRECT in the last 72 hours. Thyroid Function Tests: No  results for input(s): TSH, T4TOTAL, FREET4, T3FREE, THYROIDAB in the last 72 hours. Anemia Panel: No results for input(s): VITAMINB12, FOLATE, FERRITIN, TIBC, IRON, RETICCTPCT in the last 72 hours. Urine analysis:    Component Value Date/Time   COLORURINE AMBER (A) 03/01/2019 1301   APPEARANCEUR CLOUDY (A) 03/01/2019 1301   LABSPEC 1.012 03/01/2019 1301   PHURINE 5.0 03/01/2019 1301   GLUCOSEU NEGATIVE 03/01/2019 1301   HGBUR LARGE (A) 03/01/2019 1301   BILIRUBINUR NEGATIVE 03/01/2019 1301   KETONESUR NEGATIVE 03/01/2019 1301   PROTEINUR 100 (A) 03/01/2019 1301   UROBILINOGEN 1.0 04/10/2013 0706   NITRITE NEGATIVE 03/01/2019 1301   LEUKOCYTESUR LARGE (A) 03/01/2019 1301    Radiological Exams on Admission: No results found.  EKG: Independently reviewed.  Tachycardia with no acute ST and T wave changes.  Assessment/Plan Principal Problem:   Acute renal failure superimposed on stage 3 chronic kidney disease ([place]) Patient has elevated creatinine to 6.1 on arrival to the hospital.  Patient has a history of elevated creatinine previously to 7.0 but the baseline creatinine is 1.9 according to the medical record.  Elevated creatinine is most probably secondary to dehydration from severe vomiting and poor oral intake. Continue IV hydration Avoid nephrotoxic drugs Continue to monitor BMP  Active Problems:   Paroxysmal atrial flutter (HCC) Stable Continue home Lopressor and Eliquis Telemetry monitoring    Essential hypertension, benign Patient had a blood pressure of 177/101 to the ED. Blood pressure during my evaluation is 183/100.  IV labetalol 10 mg every 6 hours as needed if systolic blood pressure is above 096 and diastolic above 045 ordered.    DVT prophylaxis: Continue Eliquis Code Status: Full code Consults called: None Admission status: Observation/telemetry   Edmonia Lynch MD [facility]   If 7PM-7AM, please contact  night-coverage [email or web address]   04/25/2019, 1:43 AM

## 2019-04-25 NOTE — Progress Notes (Addendum)
Progress Note    [name] [name]  [id number]:[id number] DOB: 1935/08/31  DOA: 04/24/2019 PCP: [name] Ebbs, MD      Brief Narrative:    Medical records reviewed and are as summarized below:   [name] [name] is a 83 y.o. male with medical history significant of hypertension, hyperlipidemia, paroxysmal atrial flutter on Eliquis, prostate cancer and chronic renal failure (CKD stage IIIb versus CKD stage IV) who presented to ED for evaluation of worsening nausea, vomiting and generalized weakness.  He was found to have acute kidney injury      Assessment/Plan:   Principal Problem:   Acute renal failure superimposed on stage 3 chronic kidney disease (HCC) Active Problems:   Paroxysmal atrial flutter (HCC)   Essential hypertension, benign   Nausea and vomiting   AKI (acute kidney injury) ([name] [name])   Body mass index is 23.65 kg/m.    Acute kidney injury on CKD stage IIIb versus CKD stage IV/metabolic acidosis: Continue IV fluids for hydration.  Monitor BMP  Paroxysmal atrial fibrillation: Continue Eliquis and metoprolol  History of prostate cancer: Continue Flomax  Pseudohyperkalemia: Potassium was 5.5 but this was from a hemolyzed sample. Repeat BMP   Family Communication/Anticipated D/C date and plan/Code Status   DVT prophylaxis: Eliquis Code Status: Full code Family Communication: Plan discussed with the patient  disposition Plan: Possible discharge to home in 2 days     Subjective:   No vomiting, diarrhea or abdominal pain.  No shortness of breath or chest pain.  He feels a little better today.   Objective:    Vitals:   04/25/19 0231 04/25/19 0232 04/25/19 0621 04/25/19 1232  BP:   130/60 (!) 122/53  Pulse: 90  (!) 50 (!) 59  Resp:   16 16  Temp:   98.5 F (36.9 C) 98.1 F (36.7 C)  TempSrc:   Oral Oral  SpO2:   100% 99%  Weight:  68.5 kg    Height:  5\' 7"  (1.702 m)      Intake/Output Summary (Last 24 hours) at 04/25/2019 1240 Last data filed at  04/25/2019 1235 Gross per 24 hour  Intake 2780.83 ml  Output 300 ml  Net 2480.83 ml   Filed Weights   04/25/19 0232  Weight: 68.5 kg    Exam:  GEN: NAD SKIN: No rash EYES: EOMI ENT: MMM CV: RRR PULM: CTA B ABD: soft, ND, NT, +BS CNS: AAO x 3, non focal EXT: No edema or tenderness   Data Reviewed:   I have personally reviewed following labs and imaging studies:  Labs: Labs show the following:   Basic Metabolic Panel: Recent Labs  Lab 04/24/19 2122 04/25/19 0511  NA 136 133*  K 4.1 5.5*  CL 92* 101  CO2 19* 16*  GLUCOSE 231* 139*  BUN 89* 84*  CREATININE 6.10* 4.82*  CALCIUM 10.1 8.2*   GFR Estimated Creatinine Clearance: 11.2 mL/min (A) (by C-G formula based on SCr of 4.82 mg/dL (H)). Liver Function Tests: Recent Labs  Lab 04/24/19 2122 04/25/19 0511  AST 36 50*  ALT 19 14  ALKPHOS 109 78  BILITOT 1.2 0.8  PROT 10.2* 7.3  ALBUMIN 5.6* 3.8   Recent Labs  Lab 04/24/19 2122  LIPASE 66*   No results for input(s): AMMONIA in the last 168 hours. Coagulation profile No results for input(s): INR, PROTIME in the last 168 hours.  CBC: Recent Labs  Lab 04/24/19 2122 04/25/19 0511  WBC 9.4 10.2  NEUTROABS 7.1  --  HGB 19.1* 14.5  HCT 56.0* 44.6  MCV 92.7 93.5  PLT 334 299   Cardiac Enzymes: No results for input(s): CKTOTAL, CKMB, CKMBINDEX, TROPONINI in the last 168 hours. BNP (last 3 results) No results for input(s): PROBNP in the last 8760 hours. CBG: No results for input(s): GLUCAP in the last 168 hours. D-Dimer: No results for input(s): DDIMER in the last 72 hours. Hgb A1c: No results for input(s): HGBA1C in the last 72 hours. Lipid Profile: No results for input(s): CHOL, HDL, LDLCALC, TRIG, CHOLHDL, LDLDIRECT in the last 72 hours. Thyroid function studies: No results for input(s): TSH, T4TOTAL, T3FREE, THYROIDAB in the last 72 hours.  Invalid input(s): FREET3 Anemia work up: No results for input(s): VITAMINB12, FOLATE,  FERRITIN, TIBC, IRON, RETICCTPCT in the last 72 hours. Sepsis Labs: Recent Labs  Lab 04/24/19 2122 04/25/19 0511  WBC 9.4 10.2    Microbiology Recent Results (from the past 240 hour(s))  SARS CORONAVIRUS 2 (TAT 6-24 HRS) Nasopharyngeal Nasopharyngeal Swab     Status: None   Collection Time: 04/25/19 12:28 AM   Specimen: Nasopharyngeal Swab  Result Value Ref Range Status   SARS Coronavirus 2 NEGATIVE NEGATIVE Final    Comment: (NOTE) SARS-CoV-2 target nucleic acids are NOT DETECTED. The SARS-CoV-2 RNA is generally detectable in upper and lower respiratory specimens during the acute phase of infection. Negative results do not preclude SARS-CoV-2 infection, do not rule out co-infections with other pathogens, and should not be used as the sole basis for treatment or other patient management decisions. Negative results must be combined with clinical observations, patient history, and epidemiological information. The expected result is Negative. Fact Sheet for Patients: SugarRoll.be Fact Sheet for Healthcare Providers: https://www.woods-mathews.com/ This test is not yet approved or cleared by the [place] FDA and  has been authorized for detection and/or diagnosis of SARS-CoV-2 by FDA under an Emergency Use Authorization (EUA). This EUA will remain  in effect (meaning this test can be used) for the duration of the COVID-19 declaration under Section 56 4(b)(1) of the Act, 21 U.S.C. section 360bbb-3(b)(1), unless the authorization is terminated or revoked sooner. Performed at [facility], [place] [address], [place], [place] [address]     Procedures and diagnostic studies:  No results found.  Medications:   . apixaban  2.5 mg Oral BID  . metoprolol tartrate  12.5 mg Oral Daily  . tamsulosin  0.4 mg Oral QHS   Continuous Infusions: . sodium chloride Stopped (04/25/19 0026)  . sodium chloride 100 mL/hr at 04/25/19 1134      LOS: 0 days   [name] [name]  [name] [name]   *Please refer to [email or web address], password [email or web address] to get updated schedule on who will round on this patient, as [name] switch teams weekly. If 7PM-7AM, please contact night-coverage at [email or web address], password [email or web address] for any overnight needs.  04/25/2019, 12:40 PM

## 2019-04-25 NOTE — Care Management Obs Status (Signed)
[place] NOTIFICATION   Patient Details  Name: [name] [name] MRN: [id number] Date of Birth: [birth year]   Medicare Observation Status Notification Given:  Yes    MahabirJuliann Pulse, RN 04/25/2019, 12:29 PM

## 2019-04-25 NOTE — Progress Notes (Signed)
Initial Nutrition Assessment  RD working remotely.   DOCUMENTATION CODES:   Not applicable  INTERVENTION:  - will order Boost Breeze BID, each supplement provides 250 kcal and 9 grams of protein. - will order order 30 mL Prostat BID, each supplement provides 100 kcal and 15 grams of protein. - continue to encourage PO intakes.    NUTRITION DIAGNOSIS:   Increased nutrient needs related to acute illness as evidenced by estimated needs.  GOAL:   Patient will meet greater than or equal to 90% of their needs  MONITOR:   PO intake, Supplement acceptance, Labs, Weight trends  REASON FOR ASSESSMENT:   Malnutrition Screening Tool  ASSESSMENT:   83 y.o. male with medical history significant of HTN, hyperlipidemia, a. flutter on Eliquis, prostate cancer, and stage 3 vs stage 4 CKD. He presented to the ED for worsening N/V and generalized weakness. He was found to have AKI.  Patient was able to eat 50% of breakfast and lunch today without issue. Patient was last seen in person by this RD in September at which time he was having intermittent N/V and increased weakness (similar to current presentation). At that time, he was eating well in the hospital but not eating well PTA d/t symptoms. Patient reports that for the past few days he has had a decreased appetite and decreased intakes of foods and drinks; partly d/t weakness and not feeling up to preparing any items.   Per chart review, current weight is 151 lb. Weight on 10/27 was 157 lb. This indicates 6 lb weight loss (3.8% body weight) in the past 2 months; not significant for time frame but unable to determine if weight loss occurred more acutely.   Per notes: - AKI on CKD - metabolic acidosis   Labs reviewed; Na: 133 mmol/l, K: 5.5 mmol/l, BUN: 84 mg/dl, creatinine: 4.82 mg/dl, Ca: 8.2 m/dl, GFR: 12 ml/min. Medications reviewed.  IVF; NS @ 100 ml/hr.    NUTRITION - FOCUSED PHYSICAL EXAM:  unable to complete at this time.    Diet Order:   Diet Order            Diet Heart Room service appropriate? Yes; Fluid consistency: Thin  Diet effective now              EDUCATION NEEDS:   Not appropriate for education at this time  Skin:  Skin Assessment: Reviewed RN Assessment  Last BM:  PTA  Height:   Ht Readings from Last 1 Encounters:  04/25/19 5\' 7"  (1.702 m)    Weight:   Wt Readings from Last 1 Encounters:  04/25/19 68.5 kg    Ideal Body Weight:  67.3 kg  BMI:  Body mass index is 23.65 kg/m.  Estimated Nutritional Needs:   Kcal:  1750-2000 kcal  Protein:  70-80 grams  Fluid:  >/= 1.8 L/day      Jarome Matin, MS, RD, LDN, [facility] Inpatient Clinical Dietitian Pager # [phone] After hours/weekend pager # [phone]

## 2019-04-26 LAB — URINE CULTURE: Culture: <10000 — AB

## 2019-04-26 LAB — BASIC METABOLIC PANEL
Anion gap: 9 (ref 5–15)
BUN: 76 mg/dL — ABNORMAL HIGH (ref 8–23)
CO2: 18 mmol/L — ABNORMAL LOW (ref 22–32)
Calcium: 8.3 mg/dL — ABNORMAL LOW (ref 8.9–10.3)
Chloride: 108 mmol/L (ref 98–111)
Creatinine, Ser: 2.6 mg/dL — ABNORMAL HIGH (ref 0.61–1.24)
GFR calc Af Amer: 26 mL/min — ABNORMAL LOW (ref >60)
GFR calc non Af Amer: 22 mL/min — ABNORMAL LOW (ref >60)
Glucose, Bld: 134 mg/dL — ABNORMAL HIGH (ref 70–99)
Potassium: 3.5 mmol/L (ref 3.5–5.1)
Sodium: 135 mmol/L (ref 135–145)

## 2019-04-26 NOTE — Progress Notes (Addendum)
Progress Note    [name] [name]  [id number]:[id number] DOB: [birth year]  DOA: 04/24/2019 PCP: [name], MD      Brief Narrative:    Medical records reviewed and are as summarized below:   [name] [name] is a 83 y.o. male with medical history significant of hypertension, hyperlipidemia, paroxysmal atrial flutter on Eliquis, prostate cancer and chronic renal failure (CKD stage IIIb versus CKD stage IV) who presented to ED for evaluation of worsening nausea, vomiting and generalized weakness.  He was found to have acute kidney injury      Assessment/Plan:   Principal Problem:   Acute renal failure superimposed on stage 3 chronic kidney disease (HCC) Active Problems:   Paroxysmal atrial flutter (HCC)   Essential hypertension, benign   Nausea and vomiting   AKI (acute kidney injury) ([place])   Body mass index is 23.65 kg/m.    Acute kidney injury on CKD stage IIIb versus CKD stage IV/metabolic acidosis: Continue IV fluids but decrease rate.  Repeat BMP tomorrow.   Paroxysmal atrial fibrillation/atrial flutter/bradycardia: Asymptomatic.  Continue Eliquis and metoprolol.  He has a permanent pacemaker in place.  History of prostate cancer: Continue Flomax  Debility/generalized weakness: Consult PT  Pseudohyperkalemia: Repeat potassium was normal.   Family Communication/Anticipated D/C date and plan/Code Status   DVT prophylaxis: Eliquis Code Status: Full code Family Communication: Plan discussed with the patient  disposition Plan: Possible discharge to home tomorrow    Subjective:   C/o generalized weakness.  He normally uses a walker at home.  No other complaints.   Objective:    Vitals:   04/25/19 0621 04/25/19 1232 04/25/19 2013 04/26/19 0604  BP: 130/60 (!) 122/53 118/63 (!) 146/59  Pulse: (!) 50 (!) 59 (!) 50 (!) 49  Resp: 16 16 18 18   Temp: 98.5 F (36.9 C) 98.1 F (36.7 C) 98.1 F (36.7 C) 97.9 F (36.6 C)  TempSrc: Oral Oral Oral Oral  SpO2: 100%  99% 100% 100%  Weight:      Height:        Intake/Output Summary (Last 24 hours) at 04/26/2019 1140 Last data filed at 04/25/2019 1923 Gross per 24 hour  Intake 360 ml  Output 200 ml  Net 160 ml   Filed Weights   04/25/19 0232  Weight: 68.5 kg    Exam:   GEN: NAD SKIN: No rash EYES: Anicteric ENT: MMM CV: RRR PULM: CTA B ABD: soft, ND, NT, +BS CNS: AAO x 3, non focal EXT: No edema or tenderness    Data Reviewed:   I have personally reviewed following labs and imaging studies:  Labs: Labs show the following:   Basic Metabolic Panel: Recent Labs  Lab 04/24/19 2122 04/25/19 0511 04/26/19 0523  NA 136 133* 135  K 4.1 5.5* 3.5  CL 92* 101 108  CO2 19* 16* 18*  GLUCOSE 231* 139* 134*  BUN 89* 84* 76*  CREATININE 6.10* 4.82* 2.60*  CALCIUM 10.1 8.2* 8.3*   GFR Estimated Creatinine Clearance: 20.8 mL/min (A) (by C-G formula based on SCr of 2.6 mg/dL (H)). Liver Function Tests: Recent Labs  Lab 04/24/19 2122 04/25/19 0511  AST 36 50*  ALT 19 14  ALKPHOS 109 78  BILITOT 1.2 0.8  PROT 10.2* 7.3  ALBUMIN 5.6* 3.8   Recent Labs  Lab 04/24/19 2122  LIPASE 66*   No results for input(s): AMMONIA in the last 168 hours. Coagulation profile No results for input(s): INR, PROTIME in the last 168 hours.  CBC: Recent Labs  Lab 04/24/19 2122 04/25/19 0511  WBC 9.4 10.2  NEUTROABS 7.1  --   HGB 19.1* 14.5  HCT 56.0* 44.6  MCV 92.7 93.5  PLT 334 299   Cardiac Enzymes: No results for input(s): CKTOTAL, CKMB, CKMBINDEX, TROPONINI in the last 168 hours. BNP (last 3 results) No results for input(s): PROBNP in the last 8760 hours. CBG: No results for input(s): GLUCAP in the last 168 hours. D-Dimer: No results for input(s): DDIMER in the last 72 hours. Hgb A1c: No results for input(s): HGBA1C in the last 72 hours. Lipid Profile: No results for input(s): CHOL, HDL, LDLCALC, TRIG, CHOLHDL, LDLDIRECT in the last 72 hours. Thyroid function studies: No  results for input(s): TSH, T4TOTAL, T3FREE, THYROIDAB in the last 72 hours.  Invalid input(s): FREET3 Anemia work up: No results for input(s): VITAMINB12, FOLATE, FERRITIN, TIBC, IRON, RETICCTPCT in the last 72 hours. Sepsis Labs: Recent Labs  Lab 04/24/19 2122 04/25/19 0511  WBC 9.4 10.2    Microbiology Recent Results (from the past 240 hour(s))  SARS CORONAVIRUS 2 (TAT 6-24 HRS) Nasopharyngeal Nasopharyngeal Swab     Status: None   Collection Time: 04/25/19 12:28 AM   Specimen: Nasopharyngeal Swab  Result Value Ref Range Status   SARS Coronavirus 2 NEGATIVE NEGATIVE Final    Comment: (NOTE) SARS-CoV-2 target nucleic acids are NOT DETECTED. The SARS-CoV-2 RNA is generally detectable in upper and lower respiratory specimens during the acute phase of infection. Negative results do not preclude SARS-CoV-2 infection, do not rule out co-infections with other pathogens, and should not be used as the sole basis for treatment or other patient management decisions. Negative results must be combined with clinical observations, patient history, and epidemiological information. The expected result is Negative. Fact Sheet for Patients: SugarRoll.be Fact Sheet for Healthcare Providers: https://www.woods-mathews.com/ This test is not yet approved or cleared by the [place] FDA and  has been authorized for detection and/or diagnosis of SARS-CoV-2 by FDA under an Emergency Use Authorization (EUA). This EUA will remain  in effect (meaning this test can be used) for the duration of the COVID-19 declaration under Section 56 4(b)(1) of the Act, 21 U.S.C. section 360bbb-3(b)(1), unless the authorization is terminated or revoked sooner. Performed at [facility], [place] [address] [place], [place] [address]   UCx     Status: Abnormal   Collection Time: 04/25/19  2:31 AM   Specimen: Urine, Random  Result Value Ref Range Status   Specimen  Description   Final    URINE, RANDOM Performed at [facility] [address], [place], [place] [address]    Special Requests   Final    NONE Performed at [facility], [place] [address], [place], [place] [address]    Culture (A)  Final    <10,000 COLONIES/mL INSIGNIFICANT GROWTH Performed at [place] [address] [place], [place] [address]    Report Status 04/26/2019 FINAL  Final    Procedures and diagnostic studies:  No results found.  Medications:   . apixaban  2.5 mg Oral BID  . feeding supplement  1 Container Oral BID BM  . feeding supplement (PRO-STAT SUGAR FREE 64)  30 mL Oral BID  . metoprolol tartrate  12.5 mg Oral Daily  . multivitamin with minerals  1 tablet Oral Daily  . tamsulosin  0.4 mg Oral QHS   Continuous Infusions: . sodium chloride Stopped (04/25/19 0026)  . sodium chloride 75 mL/hr  at 04/26/19 0828     LOS: 1 day   Keymani Glynn  [facility]   *Please refer to [email or web address], password [email or web address] to get updated schedule on who will round on this patient, as hospitalists switch teams weekly. If 7PM-7AM, please contact night-coverage at [email or web address], password [email or web address] for any overnight needs.  04/26/2019, 11:40 AM

## 2019-04-26 NOTE — Evaluation (Signed)
Physical Therapy Evaluation Patient Details Name: [name] [name] MRN: [id number] DOB: 12/28/1935 Today's Date: 04/26/2019   History of Present Illness  Pt is a 83 y.o. male with medical history significant of hypertension, hyperlipidemia, paroxysmal atrial flutter, prostate cancer, chronic renal failure, and see H and P for full PMH.  Pt presented to ED for evaluation of worsening nausea and vomiting and generalized weakness. He was admitted for acute renal failure  Clinical Impression  Pt admitted with above diagnosis. Pt presenting with near baseline mobility.  He was able to demonstrate safe gait and transfers with safe balance.  No further acute PT needs.      Follow Up Recommendations No PT follow up    Equipment Recommendations  None recommended by PT    Recommendations for Other Services       Precautions / Restrictions Precautions Precautions: Fall      Mobility  Bed Mobility Overal bed mobility: Independent                Transfers Overall transfer level: Needs assistance Equipment used: None Transfers: Sit to/from Stand Sit to Stand: Supervision            Ambulation/Gait Ambulation/Gait assistance: Supervision Gait Distance (Feet): 200 Feet Assistive device: None Gait Pattern/deviations: WFL(Within Functional Limits)     General Gait Details: normal speed; steady gait ; in addition to DGI pt was able to march and perform tandem gait with min guard without LOB  Stairs            Wheelchair Mobility    Modified Rankin (Stroke Patients Only)       Balance Overall balance assessment: Independent Sitting-balance support: No upper extremity supported;Feet supported Sitting balance-Leahy Scale: Normal     Standing balance support: No upper extremity supported Standing balance-Leahy Scale: Normal                   Standardized Balance Assessment Standardized Balance Assessment : Dynamic Gait Index   Dynamic Gait Index Level  Surface: Normal Change in Gait Speed: Normal Gait with Horizontal Head Turns: Normal Gait with Vertical Head Turns: Normal Gait and Pivot Turn: Normal Step Over Obstacle: Mild Impairment Step Around Obstacles: Normal Steps: Mild Impairment Total Score: 22       Pertinent Vitals/Pain Pain Assessment: No/denies pain    Home Living Family/patient expects to be discharged to:: Private residence Living Arrangements: Alone Available Help at Discharge: Family;Available PRN/intermittently Type of Home: Apartment Home Access: Level entry     Home Layout: One level Home Equipment: Walker - 4 wheels      Prior Function Level of Independence: Independent         Comments: Reports independent with all ADLs and IADLs; does community ambulation     Hand Dominance        Extremity/Trunk Assessment   Upper Extremity Assessment Upper Extremity Assessment: Overall WFL for tasks assessed(ROM WFL; MMT 5/5)    Lower Extremity Assessment Lower Extremity Assessment: Overall WFL for tasks assessed(ROM WFL; MMT 5/5)    Cervical / Trunk Assessment Cervical / Trunk Assessment: Normal  Communication   Communication: No difficulties  Cognition Arousal/Alertness: Awake/alert Behavior During Therapy: WFL for tasks assessed/performed Overall Cognitive Status: Within Functional Limits for tasks assessed                                        General Comments General  comments (skin integrity, edema, etc.): Pt able to straighten sheets in standing without LOB    Exercises     Assessment/Plan    PT Assessment Patent does not need any further PT services  PT Problem List         PT Treatment Interventions      PT Goals (Current goals can be found in the Care Plan section)  Acute Rehab PT Goals Patient Stated Goal: return home PT Goal Formulation: With patient Time For Goal Achievement: 04/26/19 Potential to Achieve Goals: Good    Frequency     Barriers  to discharge        Co-evaluation               AM-PAC PT "6 Clicks" Mobility  Outcome Measure Help needed turning from your back to your side while in a flat bed without using bedrails?: None Help needed moving from lying on your back to sitting on the side of a flat bed without using bedrails?: None Help needed moving to and from a bed to a chair (including a wheelchair)?: None Help needed standing up from a chair using your arms (e.g., wheelchair or bedside chair)?: None Help needed to walk in hospital room?: None Help needed climbing 3-5 steps with a railing? : None 6 Click Score: 24    End of Session Equipment Utilized During Treatment: Gait belt Activity Tolerance: Patient tolerated treatment well Patient left: in bed;with call [place]/phone within reach;with bed alarm set Nurse Communication: Mobility status      Time: 1700-1723 PT Time Calculation (min) (ACUTE ONLY): 23 min   Charges:   PT Evaluation $PT Eval Low Complexity: 1 Low          Maggie Font, PT Acute Rehab Services Pager [phone] [facility] [phone] [facility] [place] 04/26/2019, 5:33 PM

## 2019-04-26 NOTE — TOC Initial Note (Signed)
Transition of Care [facility]) - Initial/Assessment Note    Patient Details  Name: Brandun Pinn MRN: [id number] Date of Birth: 1936-02-06  Transition of Care [facility]) CM/SW Contact:    Dessa Phi, RN Phone Number: 04/26/2019, 12:57 PM  Clinical Narrative:  Continue to monitor for discharge  needs.                 Expected Discharge Plan: Home/Self Care Barriers to Discharge: Continued Medical Work up   Patient Goals and CMS Choice Patient states their goals for this hospitalization and ongoing recovery are:: go home      Expected Discharge Plan and Services Expected Discharge Plan: Home/Self Care   Discharge Planning Services: CM Consult                                          Prior Living Arrangements/Services   Lives with:: Self Patient language and need for interpreter reviewed:: Yes Do you feel safe going back to the place where you live?: Yes      Need for Family Participation in Patient Care: No (Comment) Care giver support system in place?: Yes (comment) Current home services: DME(rw) Criminal Activity/Legal Involvement Pertinent to Current Situation/Hospitalization: No - Comment as needed  Activities of Daily Living Home Assistive Devices/Equipment: Dentures (specify type), Eyeglasses, Walker (specify type) ADL Screening (condition at time of admission) Patient's cognitive ability adequate to safely complete daily activities?: Yes Is the patient deaf or have difficulty hearing?: No Does the patient have difficulty seeing, even when wearing glasses/contacts?: No Does the patient have difficulty concentrating, remembering, or making decisions?: No Patient able to express need for assistance with ADLs?: Yes Does the patient have difficulty dressing or bathing?: No Independently performs ADLs?: Yes (appropriate for developmental age)(per patient. ) Does the patient have difficulty walking or climbing stairs?: Yes Weakness of Legs: Both Weakness of  Arms/Hands: None  Permission Sought/Granted Permission sought to share information with : Case Manager Permission granted to share information with : Yes, Verbal Permission Granted              Emotional Assessment Appearance:: Appears stated age Attitude/Demeanor/Rapport: Gracious Affect (typically observed): Accepting Orientation: : Oriented to Self, Oriented to Place, Oriented to  Time, Oriented to Situation Alcohol / Substance Use: Not Applicable Psych Involvement: No (comment)  Admission diagnosis:  Dehydration [E86.0] Acute renal failure (HCC) [N17.9] AKI (acute kidney injury) ([place]) [N17.9] Patient Active Problem List   Diagnosis Date Noted  . Nausea and vomiting 04/25/2019  . AKI (acute kidney injury) ([place]) 04/25/2019  . Acute renal failure ([place]) 04/24/2019  . Dehydration 03/01/2019  . Acute renal failure (ARF) ([place]) 03/01/2019  . Orthostatic syncope 01/11/2019  . Acute renal failure superimposed on stage 3 chronic kidney disease ([place]) 01/11/2019  . Hyponatremia 01/11/2019  . Hypokalemia 01/11/2019  . Syncope 10/27/2018  . Encounter for care of pacemaker 06/17/2018  . Diverticular disease 05/13/2013  . Renal insufficiency 12/31/2011  . Dyslipidemia 12/30/2011  . Carcinoma of prostate ([place]) 12/30/2011  . Pacemaker-[employer] DR-RF dual chamber pacemaker  12/03/2011  . Sick sinus syndrome ([place]) 11/28/2011  . Paroxysmal atrial flutter ([place]) 06/16/2011  . Essential hypertension, benign 06/16/2011   PCP:  [name], MD Pharmacy:   [facility], [place] - [address] AT [place] [address] Phone:  [phone] Fax: [place], [place] [address] [place] [address] Phone: [phone] Fax: [phone]     Social Determinants of Health ([place]) Interventions    Readmission Risk  Interventions Readmission Risk Prevention Plan 04/26/2019  Transportation Screening Complete  PCP or Specialist Appt within 3-5 Days Not Complete  HRI or [place] Not Complete  Social Work Consult for [place] Planning/Counseling Not Complete  Palliative Care Screening Not Complete  Medication Review Press photographer) Complete  Some recent data might be hidden

## 2019-04-26 NOTE — Progress Notes (Signed)
Report received from Kellie Shropshire., RN. No change from initial pm assessment. Will continue to monitor and follow the POC.

## 2019-04-27 DIAGNOSIS — N184 Chronic kidney disease, stage 4 (severe): Secondary | ICD-10-CM

## 2019-04-27 DIAGNOSIS — R112 Nausea with vomiting, unspecified: Secondary | ICD-10-CM

## 2019-04-27 LAB — BASIC METABOLIC PANEL
Anion gap: 10 (ref 5–15)
BUN: 56 mg/dL — ABNORMAL HIGH (ref 8–23)
CO2: 18 mmol/L — ABNORMAL LOW (ref 22–32)
Calcium: 8.2 mg/dL — ABNORMAL LOW (ref 8.9–10.3)
Chloride: 109 mmol/L (ref 98–111)
Creatinine, Ser: 1.6 mg/dL — ABNORMAL HIGH (ref 0.61–1.24)
GFR calc Af Amer: 46 mL/min — ABNORMAL LOW (ref >60)
GFR calc non Af Amer: 40 mL/min — ABNORMAL LOW (ref >60)
Glucose, Bld: 106 mg/dL — ABNORMAL HIGH (ref 70–99)
Potassium: 3.6 mmol/L (ref 3.5–5.1)
Sodium: 137 mmol/L (ref 135–145)

## 2019-04-27 NOTE — Progress Notes (Signed)
Went over discharge papers with patient.  All questions answered. VSS. IV taken out.  PT wheeled out via NT.

## 2019-04-27 NOTE — Discharge Summary (Signed)
Discharge Summary  [name] [name] VQX:[id number] DOB: 11/01/1935  PCP: [name], MD  Admit date: 04/24/2019 Discharge date: 04/27/2019  Time spent: 35 mins  Recommendations for Outpatient Follow-up:  1. Follow-up with PCP in 1 week, with repeat labs  Discharge Diagnoses:  Active Hospital Problems   Diagnosis Date Noted  . Acute renal failure superimposed on stage 3 chronic kidney disease ([place]) 01/11/2019  . Nausea and vomiting 04/25/2019  . AKI (acute kidney injury) ([place]) 04/25/2019  . Essential hypertension, benign 06/16/2011  . Paroxysmal atrial flutter ([place]) 06/16/2011    Resolved Hospital Problems  No resolved problems to display.    Discharge Condition: Stable  Diet recommendation: Heart healthy  Vitals:   04/27/19 0619 04/27/19 1115  BP: (!) 176/72 (!) 143/71  Pulse: 62 66  Resp:    Temp:    SpO2: 100%     History of present illness:  [name] [name] a 83 y.o.malewith medical history significant ofhypertension, hyperlipidemia, paroxysmal atrial flutter on Eliquis, prostate cancer and chronic renal failure stage IV, who presented to ED for evaluation of worsening nausea, vomiting and generalized weakness.  He was found to have acute kidney injury.  Patient admitted for further management    Today, patient denies any new complaints, reports resolved nausea/vomiting, feels much better overall.  Unsure why AKI likely 2/2 poor hydration due to reduced p.o. intake.  Patient stable for discharge with close follow-up with PCP.   Hospital Course:  Principal Problem:   Acute renal failure superimposed on stage 3 chronic kidney disease (HCC) Active Problems:   Paroxysmal atrial flutter (HCC)   Essential hypertension, benign   Nausea and vomiting   AKI (acute kidney injury) ([place])  Acute kidney injury on CKD stage IV/metabolic acidosis Creatinine back to baseline status post IV fluids AKI likely due to ??  Poor oral intake Encourage adequate oral  hydration Follow-up with PCP with repeat labs in 1 week  Paroxysmal atrial fibrillation/atrial flutter/bradycardia Heart rate controlled, has permanent pacemaker in place Continue Eliquis and metoprolol  History of prostate cancer Continue Flomax, xtandi  Debility/generalized weakness Improved PT with no further recommendation  Hypertension BP somewhat uncontrolled Follow-up with PCP          Malnutrition Type:  Nutrition Problem: Increased nutrient needs Etiology: acute illness   Malnutrition Characteristics:  Signs/Symptoms: estimated needs   Nutrition Interventions:  Interventions: Prostat, Boost Breeze   Estimated body mass index is 23.65 kg/m as calculated from the following:   Height as of this encounter: 5\' 7"  (1.702 m).   Weight as of this encounter: 68.5 kg.    Procedures:  None  Consultations:  None  Discharge Exam: BP (!) 143/71   Pulse 66   Temp 97.9 F (36.6 C) (Oral)   Resp 18   Ht 5\' 7"  (1.702 m)   Wt 68.5 kg   SpO2 100%   BMI 23.65 kg/m   General: NAD Cardiovascular: S1, S2 present Respiratory: CTA B  Discharge Instructions You were cared for by a hospitalist during your hospital stay. If you have any questions about your discharge medications or the care you received while you were in the hospital after you are discharged, you can call the unit and asked to speak with the hospitalist on call if the hospitalist that took care of you is not available. Once you are discharged, your primary care physician will handle any further medical issues. Please note that NO REFILLS for any discharge medications will be authorized once you are discharged,  as it is imperative that you return to your primary care physician (or establish a relationship with a primary care physician if you do not have one) for your aftercare needs so that they can reassess your need for medications and monitor your lab values.  Discharge Instructions    Diet  - low sodium heart healthy   Complete by: As directed    Increase activity slowly   Complete by: As directed      Allergies as of 04/27/2019      Reactions   Xarelto [rivaroxaban] Other (See Comments)   "I didn't like the way it made me feel" (patient didn't elaborate)      Medication List    STOP taking these medications   ondansetron 4 MG tablet Commonly known as: ZOFRAN     TAKE these medications   apixaban 2.5 MG Tabs tablet Commonly known as: Eliquis Take 1 tablet (2.5 mg total) by mouth 2 (two) times daily.   atorvastatin 20 MG tablet Commonly known as: LIPITOR TAKE 1 TABLET BY MOUTH DAILY   metoprolol tartrate 25 MG tablet Commonly known as: LOPRESSOR Take 0.5 tablets (12.5 mg total) by mouth daily.   multivitamin with minerals Tabs tablet Take 1 tablet by mouth daily.   tamsulosin 0.4 MG Caps capsule Commonly known as: FLOMAX Take 0.4 mg by mouth at bedtime.   Xtandi 40 MG capsule Generic drug: enzalutamide Take 80 mg by mouth 2 (two) times daily.      Allergies  Allergen Reactions  . Xarelto [Rivaroxaban] Other (See Comments)    "I didn't like the way it made me feel" (patient didn't elaborate)   Follow-up Information    [name], MD. Schedule an appointment as soon as possible for a visit in 1 week(s).   Specialty: Internal Medicine Contact information: [place] [address] [phone]            The results of significant diagnostics from this hospitalization (including imaging, microbiology, ancillary and laboratory) are listed below for reference.    Significant Diagnostic Studies: No results found.  Microbiology: Recent Results (from the past 240 hour(s))  SARS CORONAVIRUS 2 (TAT 6-24 HRS) Nasopharyngeal Nasopharyngeal Swab     Status: None   Collection Time: 04/25/19 12:28 AM   Specimen: Nasopharyngeal Swab  Result Value Ref Range Status   SARS Coronavirus 2 NEGATIVE NEGATIVE Final    Comment:  (NOTE) SARS-CoV-2 target nucleic acids are NOT DETECTED. The SARS-CoV-2 RNA is generally detectable in upper and lower respiratory specimens during the acute phase of infection. Negative results do not preclude SARS-CoV-2 infection, do not rule out co-infections with other pathogens, and should not be used as the sole basis for treatment or other patient management decisions. Negative results must be combined with clinical observations, patient history, and epidemiological information. The expected result is Negative. Fact Sheet for Patients: SugarRoll.be Fact Sheet for Healthcare Providers: https://www.woods-mathews.com/ This test is not yet approved or cleared by the [place] FDA and  has been authorized for detection and/or diagnosis of SARS-CoV-2 by FDA under an Emergency Use Authorization (EUA). This EUA will remain  in effect (meaning this test can be used) for the duration of the COVID-19 declaration under Section 56 4(b)(1) of the Act, 21 U.S.C. section 360bbb-3(b)(1), unless the authorization is terminated or revoked sooner. Performed at [facility], [place] [address], [place], [place] [address]   UCx     Status: Abnormal   Collection Time: 04/25/19  2:31  AM   Specimen: Urine, Random  Result Value Ref Range Status   Specimen Description   Final    URINE, RANDOM Performed at [facility] [address] [place], [place] [address]    Special Requests   Final    NONE Performed at [facility], [place] [address] [place], [place] [address]    Culture (A)  Final    <10,000 COLONIES/mL INSIGNIFICANT GROWTH Performed at [place] [address], [place], [place] [address]    Report Status 04/26/2019 FINAL  Final     Labs: Basic Metabolic Panel: Recent Labs  Lab 04/24/19 2122 04/25/19 0511 04/26/19 0523 04/27/19 0503  NA 136 133* 135 137  K 4.1 5.5* 3.5 3.6  CL 92* 101  108 109  CO2 19* 16* 18* 18*  GLUCOSE 231* 139* 134* 106*  BUN 89* 84* 76* 56*  CREATININE 6.10* 4.82* 2.60* 1.60*  CALCIUM 10.1 8.2* 8.3* 8.2*   Liver Function Tests: Recent Labs  Lab 04/24/19 2122 04/25/19 0511  AST 36 50*  ALT 19 14  ALKPHOS 109 78  BILITOT 1.2 0.8  PROT 10.2* 7.3  ALBUMIN 5.6* 3.8   Recent Labs  Lab 04/24/19 2122  LIPASE 66*   No results for input(s): AMMONIA in the last 168 hours. CBC: Recent Labs  Lab 04/24/19 2122 04/25/19 0511  WBC 9.4 10.2  NEUTROABS 7.1  --   HGB 19.1* 14.5  HCT 56.0* 44.6  MCV 92.7 93.5  PLT 334 299   Cardiac Enzymes: No results for input(s): CKTOTAL, CKMB, CKMBINDEX, TROPONINI in the last 168 hours. BNP: BNP (last 3 results) No results for input(s): BNP in the last 8760 hours.  ProBNP (last 3 results) No results for input(s): PROBNP in the last 8760 hours.  CBG: No results for input(s): GLUCAP in the last 168 hours.     Signed:  Alma Friendly, MD [facility] 04/27/2019, 11:42 AM

## 2019-05-03 DIAGNOSIS — I495 Sick sinus syndrome: Secondary | ICD-10-CM

## 2019-05-03 DIAGNOSIS — Z950 Presence of cardiac pacemaker: Secondary | ICD-10-CM

## 2019-05-03 DIAGNOSIS — Z45018 Encounter for adjustment and management of other part of cardiac pacemaker: Secondary | ICD-10-CM

## 2019-05-05 ENCOUNTER — Telehealth: Payer: Self-pay

## 2019-05-05 NOTE — Telephone Encounter (Signed)
-----   Message from Adrian Prows, MD sent at 05/04/2019 11:56 PM EST ----- Regarding: Pacemaker Normal function. Continue anticoagulation as he continues to have atrial fib/flutter 5% burden

## 2019-05-05 NOTE — Telephone Encounter (Signed)
Unable to reach patient, LVM.

## 2019-05-20 ENCOUNTER — Other Ambulatory Visit: Payer: Self-pay

## 2019-05-20 ENCOUNTER — Encounter: Payer: Self-pay | Admitting: Cardiology

## 2019-05-20 ENCOUNTER — Ambulatory Visit (INDEPENDENT_AMBULATORY_CARE_PROVIDER_SITE_OTHER): Payer: Medicare Other | Admitting: Cardiology

## 2019-05-20 VITALS — BP 160/100 | HR 60 | Ht 67.0 in | Wt 167.5 lb

## 2019-05-20 DIAGNOSIS — I4892 Unspecified atrial flutter: Secondary | ICD-10-CM | POA: Diagnosis not present

## 2019-05-20 DIAGNOSIS — I495 Sick sinus syndrome: Secondary | ICD-10-CM

## 2019-05-20 DIAGNOSIS — I10 Essential (primary) hypertension: Secondary | ICD-10-CM

## 2019-05-20 DIAGNOSIS — Z45018 Encounter for adjustment and management of other part of cardiac pacemaker: Secondary | ICD-10-CM

## 2019-05-20 DIAGNOSIS — Z950 Presence of cardiac pacemaker: Secondary | ICD-10-CM | POA: Diagnosis not present

## 2019-05-20 MED ORDER — DILTIAZEM HCL ER COATED BEADS 180 MG PO CP24: 180.0000 mg | ORAL_CAPSULE | Freq: Every day | ORAL | 2 refills | Status: DC

## 2019-05-20 MED ORDER — METOPROLOL TARTRATE 50 MG PO TABS: 50.0000 mg | ORAL_TABLET | Freq: Every day | ORAL | 1 refills | Status: DC

## 2019-05-20 NOTE — Progress Notes (Signed)
Primary Physician/Referring:  [name], MD  Patient ID: [name] [name], male    DOB: 1936-03-18, 84 y.o.   MRN: [id number]  Chief Complaint  Patient presents with  . Atrial Flutter  . Hypertension   HPI:    [name] [name]  is a 84 y.o. African-American male with history of paroxysmal atrial flutter status post ablation but had recurrence of atrial flutter and hence has been on anticoagulation, sick sinus syndrome S/P pacemaker implantation in 2013, also has had atrial fibrillation on pacemaker interrogation.  He is now diagnosed with prostate cancer and is undergoing chemotherapy for the same.  Since then patient has had at least 2 admissions with dehydration, failure to thrive and acute renal failure along with syncope. He has had several episodes of atrial tachycardia during hospitalization but  Very brief episodes and unrelated to syncope.   He is presently doing well, since discontinuing chemotherapy, she has not had any further episodes of hypotension, dizziness or syncope.  He is tolerating Eliquis without bleeding complications.  Feels well and essentially asymptomatic.  Past Medical History:  Diagnosis Date  . AKI (acute kidney injury) ([place]) 06/08/2018  . Atrial flutter ([place])   . Carcinoma of prostate ([place]) 12/30/2011   Treated with seed/radiation therapy.   . Colostomy in place [facility])   . Difficulty sleeping    lives in shelter currently  . Diverticulitis   . Dyslipidemia 12/30/2011  . Encounter for care of pacemaker 06/17/2018  . Frequency of urination   . Heart murmur   . Hypertension 04/30/11   Cardioversion 06/16/11  . Near syncope 06/07/2018  . Nocturia   . Orthostatic hypotension 06/08/2018  . Pacemaker 7/13    sick sinus syndrome/St Jude pacemaker  . Paroxysmal atrial flutter ([place]) 06/16/2011  . Prostate cancer ([place])   . Sick sinus syndrome ([place]) 11/28/2011  . Syncope and collapse 11/20/2011   Atrial and ventricular standstill > 3 seconds.   . Thyroid disease    had  "overactive thyroid in 1997" - no known problem since   Past Surgical History:  Procedure Laterality Date  . ATRIAL FLUTTER ABLATION N/A 11/21/2011   Procedure: ATRIAL FLUTTER ABLATION;  Surgeon: Thompson Grayer, MD;  Location: [facility] CATH LAB;  Service: Cardiovascular;  Laterality: N/A;  . CARDIAC ELECTROPHYSIOLOGY STUDY AND ABLATION  7/13  . CARDIOVERSION  06/16/2011   Procedure: CARDIOVERSION;  Surgeon: Laverda Page, MD;  Location: [place];  Service: Cardiovascular;  Laterality: N/A;  . COLON SURGERY  05/13/2013  . COLOSTOMY CLOSURE N/A 11/25/2013   Procedure: COLOSTOMY CLOSURE;  Surgeon: Earnstine Regal, MD;  Location: [facility] ORS;  Service: General;  Laterality: N/A;  . COLOSTOMY CLOSURE  11/25/2013  . PACEMAKER INSERTION  11/28/11   SJM Accent DR RF implanted by Dr Rayann Heman  . PARTIAL COLECTOMY N/A 05/13/2013   Procedure: sigmoid COLECTOMY  colostomy ;  Surgeon: Earnstine Regal, MD;  Location: [facility] ORS;  Service: General;  Laterality: N/A;  . PERMANENT PACEMAKER INSERTION N/A 11/28/2011   Procedure: PERMANENT PACEMAKER INSERTION;  Surgeon: Thompson Grayer, MD;  Location: [facility] CATH LAB;  Service: Cardiovascular;  Laterality: N/A;  . RADIOACTIVE SEED IMPLANT    . TONSILLECTOMY     Social History   Tobacco Use  . Smoking status: Former Smoker    Packs/day: 0.50    Years: 50.00    Pack years: 25.00    Types: Cigarettes    Quit date: 07/20/2009    Years since quitting: 9.8  . Smokeless tobacco:  Never Used  Substance Use Topics  . Alcohol use: No    ROS  Review of Systems  Constitution: Negative for weight gain.  Cardiovascular: Negative for dyspnea on exertion, leg swelling and syncope.  Respiratory: Negative for hemoptysis.   Endocrine: Negative for cold intolerance.  Hematologic/Lymphatic: Does not bruise/bleed easily.  Gastrointestinal: Negative for hematochezia and melena.  Neurological: Negative for headaches and light-headedness.   Objective  Blood pressure (!) 160/100, pulse 60, height 5\' 7"   (1.702 m), weight 167 lb 8 oz (76 kg), SpO2 100 %.  Vitals with BMI 05/20/2019 04/27/2019 04/27/2019  Height 5\' 7"  - -  Weight 167 lbs 8 oz - -  BMI 78.24 - -  Systolic 235 361 443  Diastolic 154 71 72  Pulse 60 66 62     Physical Exam  Constitutional: He appears well-developed and well-nourished. No distress.  HENT:  Head: Atraumatic.  Eyes: Conjunctivae are normal.  Neck: No JVD present. No thyromegaly present.  Cardiovascular: Normal rate, regular rhythm, normal heart sounds and intact distal pulses. Exam reveals no gallop.  No murmur heard. Pulses:      Carotid pulses are 2+ on the right side and 2+ on the left side.      Radial pulses are 2+ on the right side and 2+ on the left side.       Femoral pulses are 2+ on the right side and 2+ on the left side.      Popliteal pulses are 2+ on the right side and 2+ on the left side.       Dorsalis pedis pulses are 1+ on the right side and 1+ on the left side.       Posterior tibial pulses are 1+ on the right side and 1+ on the left side.  Pulmonary/Chest: Effort normal and breath sounds normal.  Pacemaker/ICD site noted  in the left infraclavicular fossa.    Abdominal: Soft. Bowel sounds are normal.  Musculoskeletal:        General: No edema. Normal range of motion.     Cervical back: Neck supple.  Neurological: He is alert.  Skin: Skin is warm and dry.  Psychiatric: He has a normal mood and affect.   Laboratory examination:   Recent Labs    04/25/19 0511 04/26/19 0523 04/27/19 0503  NA 133* 135 137  K 5.5* 3.5 3.6  CL 101 108 109  CO2 16* 18* 18*  GLUCOSE 139* 134* 106*  BUN 84* 76* 56*  CREATININE 4.82* 2.60* 1.60*  CALCIUM 8.2* 8.3* 8.2*  GFRNONAA 10* 22* 40*  GFRAA 12* 26* 46*   CrCl cannot be calculated (Patient's most recent lab result is older than the maximum 21 days allowed.).  CMP Latest Ref Rng & Units 04/27/2019 04/26/2019 04/25/2019  Glucose 70 - 99 mg/dL 106(H) 134(H) 139(H)  BUN 8 - 23 mg/dL 56(H)  76(H) 84(H)  Creatinine 0.61 - 1.24 mg/dL 1.60(H) 2.60(H) 4.82(H)  Sodium 135 - 145 mmol/L 137 135 133(L)  Potassium 3.5 - 5.1 mmol/L 3.6 3.5 5.5(H)  Chloride 98 - 111 mmol/L 109 108 101  CO2 22 - 32 mmol/L 18(L) 18(L) 16(L)  Calcium 8.9 - 10.3 mg/dL 8.2(L) 8.3(L) 8.2(L)  Total Protein 6.5 - 8.1 g/dL - - 7.3  Total Bilirubin 0.3 - 1.2 mg/dL - - 0.8  Alkaline Phos 38 - 126 U/L - - 78  AST 15 - 41 U/L - - 50(H)  ALT 0 - 44 U/L - - 14  CBC Latest Ref Rng & Units 04/25/2019 04/24/2019 03/03/2019  WBC 4.0 - 10.5 K/uL 10.2 9.4 9.5  Hemoglobin 13.0 - 17.0 g/dL 14.5 19.1(H) 10.4(L)  Hematocrit 39.0 - 52.0 % 44.6 56.0(H) 30.5(L)  Platelets 150 - 400 K/uL 299 334 195   Lipid Panel  No results found for: CHOL, TRIG, HDL, CHOLHDL, VLDL, LDLCALC, LDLDIRECT HEMOGLOBIN A1C Lab Results  Component Value Date   HGBA1C 6.5 (H) 03/01/2019   MPG 139.85 03/01/2019   TSH Recent Labs    06/09/18 0216 10/27/18 1535 03/02/19 1600  TSH 0.884 0.361 0.295*   Medications and allergies   Allergies  Allergen Reactions  . Xarelto [Rivaroxaban] Other (See Comments)    "I didn't like the way it made me feel" (patient didn't elaborate)     Current Outpatient Medications  Medication Instructions  . apixaban (ELIQUIS) 2.5 mg, Oral, 2 times daily  . atorvastatin (LIPITOR) 20 MG tablet TAKE 1 TABLET BY MOUTH DAILY  . diltiazem (CARDIZEM CD) 180 mg, Oral, Daily  . metoprolol tartrate (LOPRESSOR) 50 mg, Oral, Daily  . Multiple Vitamin (MULTIVITAMIN WITH MINERALS) TABS tablet 1 tablet, Oral, Daily  . ondansetron (ZOFRAN) 4 mg, Oral, Every 8 hours PRN  . tamsulosin (FLOMAX) 0.4 mg, Oral, Daily at bedtime    Radiology:  No results found.  Cardiac Studies:   Echocardiogram 06/08/2018:  1. The left ventricle has hyperdynamic systolic function of >50%. The cavity size is normal. There is no increas ed left ventricular wall thickness. Echo evidence of normal diastolic filling patterns. Normal left  ventricular filling pressures.  2. The aortic valve is tricuspid in structure. There is mild thickening and mild calcification of the aortic valve.   Assessment     ICD-10-CM   1. Encounter for care of pacemaker  Z45.018   2. Sick sinus syndrome (HCC)  I49.5   3. Pacemaker [place].1  Z95.0   4. Paroxysmal atrial flutter (HCC) CHA2DS2-VASc Score is 3.0.  I48.92 metoprolol tartrate (LOPRESSOR) 50 MG tablet  5. Essential hypertension, benign  I10 metoprolol tartrate (LOPRESSOR) 50 MG tablet    diltiazem (CARDIZEM CD) 180 MG 24 hr capsule    Remote pacemaker check 12.28.20: There were 5061 atrial high rate episodes detected. The longest lasted 00:02:50:04 in duration. EGMs show Aflutter, A lead noise, and false mode switches. There was a 5.6 % cumulative atrial arrhythmia burden. There were 65 high ventricular rate episodes detected. EGMs show a fast response to AF. Health trends do not demonstrate significant abnormality. Battery longevity is 2.8 years. RA pacing is 90.0 %, RV pacing is 3.9 %.   Scheduled In office pacemaker check 05/20/19  Presenting A paced V sensed.  There were 440 to AMS episodes.  Very brief lasting <2 minutes.  No high ventricular rate episodes.  EGM's reveal atrial flutter/atrial fibrillation. AF burden <1%.  Lead impedance and thresholds are within normal limits. AP 96% and VP 2.4%. Normal pacemaker function.    Meds ordered this encounter  Medications  . metoprolol tartrate (LOPRESSOR) 50 MG tablet    Sig: Take 1 tablet (50 mg total) by mouth daily.    Dispense:  60 tablet    Refill:  1  . diltiazem (CARDIZEM CD) 180 MG 24 hr capsule    Sig: Take 1 capsule (180 mg total) by mouth daily.    Dispense:  30 capsule    Refill:  2    Medications Discontinued During This Encounter  Medication Reason  . XTANDI 40  MG capsule Error  . metoprolol tartrate (LOPRESSOR) 25 MG tablet      Recommendations:   Quaron Delacruz  is a 84 y.o. African-American  male with history of paroxysmal atrial flutter status post ablation but had recurrence of atrial flutter and hence has been on anticoagulation, sick sinus syndrome S/P pacemaker implantation in 2013, also has had atrial fibrillation on pacemaker interrogation.  He is now diagnosed with prostate cancer and is undergoing chemotherapy for the same.  Since then patient has had at least 2 admissions with dehydration, failure to thrive and acute renal failure along with syncope. He has had several episodes of atrial tachycardia during hospitalization but  Very brief episodes and unrelated to syncope. He is now off of chemo and has not had further dehydration and syncope.   Presently doing well and tolerating anticoagulation well. He now has stage 3 CKD. Lipids managed by PCP. Patient's blood pressure is markedly uncontrolled, previously he was on metoprolol tartrate 1 mg p.o. b.i.d. and Cartia XT 180 mg p.o. q. daily, this had controlled his blood pressure well.  I will reinitiate this but that metoprolol to 20 mg p.o. b.i.d.  This will also help with atrial tachycardia and atrial fibrillation.  I will see him in 6 weeks for A. Fib and hypertension, continue anticoagulation with Eliquis 2.5 mg BID.   Adrian Prows, MD, [facility] 05/20/2019, 1:46 PM [facility]. [place] Office: [phone]

## 2019-07-05 ENCOUNTER — Ambulatory Visit: Payer: Medicare Other | Admitting: Cardiology

## 2019-07-07 ENCOUNTER — Ambulatory Visit: Payer: Medicare Other | Admitting: Cardiology

## 2019-07-07 ENCOUNTER — Other Ambulatory Visit: Payer: Self-pay

## 2019-07-07 ENCOUNTER — Encounter: Payer: Self-pay | Admitting: Cardiology

## 2019-07-07 VITALS — BP 195/67 | HR 66 | Temp 94.8°F | Ht 67.0 in | Wt 173.0 lb

## 2019-07-07 DIAGNOSIS — I4892 Unspecified atrial flutter: Secondary | ICD-10-CM

## 2019-07-07 DIAGNOSIS — I10 Essential (primary) hypertension: Secondary | ICD-10-CM

## 2019-07-07 DIAGNOSIS — N1832 Chronic kidney disease, stage 3b: Secondary | ICD-10-CM

## 2019-07-07 MED ORDER — ISOSORBIDE DINITRATE 30 MG PO TABS: 30.0000 mg | ORAL_TABLET | Freq: Three times a day (TID) | ORAL | 2 refills | Status: DC

## 2019-07-07 MED ORDER — HYDRALAZINE HCL 25 MG PO TABS: 25.0000 mg | ORAL_TABLET | Freq: Three times a day (TID) | ORAL | 2 refills | Status: DC

## 2019-07-07 NOTE — Progress Notes (Signed)
Primary Physician/Referring:  [name], MD  Patient ID: [name] [name], male    DOB: [birth year], 84 y.o.   MRN: [id number]  Chief Complaint  Patient presents with  . Atrial Fibrillation  . Follow-up    6 week   HPI:    [name] [name]  is a 84 y.o. African-American male with history of paroxysmal atrial flutter status post ablation but had recurrence of atrial flutter and hence has been on anticoagulation, sick sinus syndrome S/P pacemaker implantation in 2013, also has had atrial fibrillation on pacemaker interrogation.  Has prostate cancer and chemotherapy was discontinued due to recurrent episodes of severe hypotension.  He has had no further dehydration and syncope.   He is tolerating Eliquis without bleeding complications.  Feels well and essentially asymptomatic.  He is here for management of hypertension, atrial fibrillation and renal issues.    Past Medical History:  Diagnosis Date  . AKI (acute kidney injury) ([place]) 06/08/2018  . Atrial flutter ([place])   . Carcinoma of prostate ([place]) 12/30/2011   Treated with seed/radiation therapy.   . Colostomy in place [facility])   . Difficulty sleeping    lives in shelter currently  . Diverticulitis   . Dyslipidemia 12/30/2011  . Encounter for care of pacemaker 06/17/2018  . Frequency of urination   . Heart murmur   . Hypertension 04/30/11   Cardioversion 06/16/11  . Near syncope 06/07/2018  . Nocturia   . Orthostatic hypotension 06/08/2018  . Pacemaker 7/13    sick sinus syndrome/St Jude pacemaker  . Paroxysmal atrial flutter ([place]) 06/16/2011  . Prostate cancer ([place])   . Sick sinus syndrome ([place]) 11/28/2011  . Syncope and collapse 11/20/2011   Atrial and ventricular standstill > 3 seconds.   . Thyroid disease    had "overactive thyroid in 1997" - no known problem since   Past Surgical History:  Procedure Laterality Date  . ATRIAL FLUTTER ABLATION N/A 11/21/2011   Procedure: ATRIAL FLUTTER ABLATION;  Surgeon: Thompson Grayer, MD;  Location: [facility]  CATH LAB;  Service: Cardiovascular;  Laterality: N/A;  . CARDIAC ELECTROPHYSIOLOGY STUDY AND ABLATION  7/13  . CARDIOVERSION  06/16/2011   Procedure: CARDIOVERSION;  Surgeon: Laverda Page, MD;  Location: [place];  Service: Cardiovascular;  Laterality: N/A;  . COLON SURGERY  05/13/2013  . COLOSTOMY CLOSURE N/A 11/25/2013   Procedure: COLOSTOMY CLOSURE;  Surgeon: Earnstine Regal, MD;  Location: [facility];  Service: General;  Laterality: N/A;  . COLOSTOMY CLOSURE  11/25/2013  . PACEMAKER INSERTION  11/28/11   SJM Accent DR RF implanted by Dr Rayann Heman  . PARTIAL COLECTOMY N/A 05/13/2013   Procedure: sigmoid COLECTOMY  colostomy ;  Surgeon: Earnstine Regal, MD;  Location: [facility];  Service: General;  Laterality: N/A;  . PERMANENT PACEMAKER INSERTION N/A 11/28/2011   Procedure: PERMANENT PACEMAKER INSERTION;  Surgeon: Thompson Grayer, MD;  Location: [facility] CATH LAB;  Service: Cardiovascular;  Laterality: N/A;  . RADIOACTIVE SEED IMPLANT    . TONSILLECTOMY     Social History   Tobacco Use  . Smoking status: Former Smoker    Packs/day: 0.50    Years: 50.00    Pack years: 25.00    Types: Cigarettes    Quit date: 07/20/2009    Years since quitting: 9.9  . Smokeless tobacco: Never Used  Substance Use Topics  . Alcohol use: No    ROS  Review of Systems  Constitution: Negative for weight gain.  Cardiovascular: Negative for dyspnea on exertion, leg  swelling and syncope.  Respiratory: Negative for hemoptysis.   Endocrine: Negative for cold intolerance.  Hematologic/Lymphatic: Does not bruise/bleed easily.  Gastrointestinal: Negative for hematochezia and melena.  Neurological: Negative for headaches and light-headedness.   Objective  Blood pressure (!) 195/67, pulse 66, temperature (!) 94.8 F (34.9 C), height 5\' 7"  (1.702 m), weight 173 lb (78.5 kg), SpO2 96 %.  Vitals with BMI 07/07/2019 05/20/2019 04/27/2019  Height 5\' 7"  5\' 7"  -  Weight 173 lbs 167 lbs 8 oz -  BMI 86.57 84.69 -  Systolic 629 528 413    Diastolic 67 244 71  Pulse 66 60 66     Physical Exam  Constitutional: He appears well-developed and well-nourished. No distress.  HENT:  Head: Atraumatic.  Eyes: Conjunctivae are normal.  Neck: No JVD present. No thyromegaly present.  Cardiovascular: Normal rate, regular rhythm, normal heart sounds and intact distal pulses. Exam reveals no gallop.  No murmur heard. Pulses:      Carotid pulses are 2+ on the right side and 2+ on the left side.      Radial pulses are 2+ on the right side and 2+ on the left side.       Femoral pulses are 2+ on the right side and 2+ on the left side.      Popliteal pulses are 2+ on the right side and 2+ on the left side.       Dorsalis pedis pulses are 1+ on the right side and 1+ on the left side.       Posterior tibial pulses are 1+ on the right side and 1+ on the left side.  Pulmonary/Chest: Effort normal and breath sounds normal.  Pacemaker/ICD site noted  in the left infraclavicular fossa.    Abdominal: Soft. Bowel sounds are normal.  Musculoskeletal:        General: No edema. Normal range of motion.     Cervical back: Neck supple.  Neurological: He is alert.  Skin: Skin is warm and dry.  Psychiatric: He has a normal mood and affect.   Laboratory examination:   Recent Labs    04/25/19 0511 04/26/19 0523 04/27/19 0503  NA 133* 135 137  K 5.5* 3.5 3.6  CL 101 108 109  CO2 16* 18* 18*  GLUCOSE 139* 134* 106*  BUN 84* 76* 56*  CREATININE 4.82* 2.60* 1.60*  CALCIUM 8.2* 8.3* 8.2*  GFRNONAA 10* 22* 40*  GFRAA 12* 26* 46*   CrCl cannot be calculated (Patient's most recent lab result is older than the maximum 21 days allowed.).  CMP Latest Ref Rng & Units 04/27/2019 04/26/2019 04/25/2019  Glucose 70 - 99 mg/dL 106(H) 134(H) 139(H)  BUN 8 - 23 mg/dL 56(H) 76(H) 84(H)  Creatinine 0.61 - 1.24 mg/dL 1.60(H) 2.60(H) 4.82(H)  Sodium 135 - 145 mmol/L 137 135 133(L)  Potassium 3.5 - 5.1 mmol/L 3.6 3.5 5.5(H)  Chloride 98 - 111 mmol/L 109 108  101  CO2 22 - 32 mmol/L 18(L) 18(L) 16(L)  Calcium 8.9 - 10.3 mg/dL 8.2(L) 8.3(L) 8.2(L)  Total Protein 6.5 - 8.1 g/dL - - 7.3  Total Bilirubin 0.3 - 1.2 mg/dL - - 0.8  Alkaline Phos 38 - 126 U/L - - 78  AST 15 - 41 U/L - - 50(H)  ALT 0 - 44 U/L - - 14   CBC Latest Ref Rng & Units 04/25/2019 04/24/2019 03/03/2019  WBC 4.0 - 10.5 K/uL 10.2 9.4 9.5  Hemoglobin 13.0 - 17.0 g/dL 14.5 19.1(H)  10.4(L)  Hematocrit 39.0 - 52.0 % 44.6 56.0(H) 30.5(L)  Platelets 150 - 400 K/uL 299 334 195   Lipid Panel  No results found for: CHOL, TRIG, HDL, CHOLHDL, VLDL, LDLCALC, LDLDIRECT HEMOGLOBIN A1C Lab Results  Component Value Date   HGBA1C 6.5 (H) 03/01/2019   MPG 139.85 03/01/2019   TSH Recent Labs    10/27/18 1535 03/02/19 1600  TSH 0.361 0.295*   Medications and allergies   Allergies  Allergen Reactions  . Xarelto [Rivaroxaban] Other (See Comments)    "I didn't like the way it made me feel" (patient didn't elaborate)     Current Outpatient Medications  Medication Instructions  . apixaban (ELIQUIS) 2.5 mg, Oral, 2 times daily  . atorvastatin (LIPITOR) 20 MG tablet TAKE 1 TABLET BY MOUTH DAILY  . diltiazem (CARDIZEM CD) 180 mg, Oral, Daily  . hydrALAZINE (APRESOLINE) 25 mg, Oral, 3 times daily  . isosorbide dinitrate (ISORDIL) 30 mg, Oral, 3 times daily  . metoprolol tartrate (LOPRESSOR) 50 mg, Oral, Daily  . Multiple Vitamin (MULTIVITAMIN WITH MINERALS) TABS tablet 1 tablet, Oral, Daily  . ondansetron (ZOFRAN) 4 mg, Oral, Every 8 hours PRN  . tamsulosin (FLOMAX) 0.4 mg, Oral, Daily at bedtime    Radiology:  No results found.  Cardiac Studies:   Echocardiogram 06/08/2018:  1. The left ventricle has hyperdynamic systolic function of >06%. The cavity size is normal. There is no increas ed left ventricular wall thickness. Echo evidence of normal diastolic filling patterns. Normal left ventricular filling pressures.  2. The aortic valve is tricuspid in structure. There is mild  thickening and mild calcification of the aortic valve.   Assessment     ICD-10-CM   1. Paroxysmal atrial flutter (HCC) CHA2DS2-VASc Score is 3.0.  I48.92   2. Essential hypertension, benign  I10 hydrALAZINE (APRESOLINE) 25 MG tablet    isosorbide dinitrate (ISORDIL) 30 MG tablet    TSH  3. Stage 3b chronic kidney disease  T01.60 Basic metabolic panel    Remote pacemaker check 12.28.20: There were 5061 atrial high rate episodes detected. The longest lasted 00:02:50:04 in duration. EGMs show Aflutter, A lead noise, and false mode switches. There was a 5.6 % cumulative atrial arrhythmia burden. There were 65 high ventricular rate episodes detected. EGMs show a fast response to AF. Health trends do not demonstrate significant abnormality. Battery longevity is 2.8 years. RA pacing is 90.0 %, RV pacing is 3.9 %.   Scheduled In office pacemaker check 05/20/19  Presenting A paced V sensed.  There were 440 to AMS episodes.  Very brief lasting <2 minutes.  No high ventricular rate episodes.  EGM's reveal atrial flutter/atrial fibrillation. AF burden <1%.  Lead impedance and thresholds are within normal limits. AP 96% and VP 2.4%. Normal pacemaker function.    Meds ordered this encounter  Medications  . hydrALAZINE (APRESOLINE) 25 MG tablet    Sig: Take 1 tablet (25 mg total) by mouth 3 (three) times daily.    Dispense:  90 tablet    Refill:  2  . isosorbide dinitrate (ISORDIL) 30 MG tablet    Sig: Take 1 tablet (30 mg total) by mouth 3 (three) times daily.    Dispense:  90 tablet    Refill:  2    There are no discontinued medications.   Recommendations:   [name] [name]  is a 84 y.o. African-American male with history of paroxysmal atrial flutter status post ablation but had recurrence of atrial flutter and hence has been  on anticoagulation, sick sinus syndrome S/P pacemaker implantation in 2013, also has had atrial fibrillation on pacemaker interrogation.  Has prostate cancer and chemotherapy  was discontinued due to recurrent episodes of severe hypotension.  He has had no further dehydration and syncope.   Presently doing well and tolerating anticoagulation well. He now has stage 3 CKD. Lipids managed by PCP. Patient's blood pressure is markedly uncontrolled, I restarted him back on diltiazem and metoprolol, in spite of this today the blood pressure is well controlled.  I will add isosorbide dinitrate 30 mg 3 times daily along with hydralazine 25 mg p.o. 3 times daily and have like to see him back in 6 weeks.  He had abnormal TSH, I will repeat BMP along with TSH when he goes for lab draw for urology.  He has gained about 12 pounds in weight since discontinuing chemotherapy, advised him to watch out for the calories.  [name] Prows, MD, [facility] 07/07/2019, 11:53 AM [facility]. [place] Office: [phone]

## 2019-07-30 LAB — TSH: TSH: 0.987 u[IU]/mL (ref 0.450–4.500)

## 2019-07-30 LAB — BASIC METABOLIC PANEL
BUN/Creatinine Ratio: 12 (ref 10–24)
BUN: 20 mg/dL (ref 8–27)
CO2: 19 mmol/L — ABNORMAL LOW (ref 20–29)
Calcium: 9.5 mg/dL (ref 8.6–10.2)
Chloride: 103 mmol/L (ref 96–106)
Creatinine, Ser: 1.7 mg/dL — ABNORMAL HIGH (ref 0.76–1.27)
GFR calc Af Amer: 43 mL/min/{1.73_m2} — ABNORMAL LOW (ref >59)
GFR calc non Af Amer: 37 mL/min/{1.73_m2} — ABNORMAL LOW (ref >59)
Glucose: 121 mg/dL — ABNORMAL HIGH (ref 65–99)
Potassium: 3.9 mmol/L (ref 3.5–5.2)
Sodium: 143 mmol/L (ref 134–144)

## 2019-07-30 NOTE — Progress Notes (Signed)
Cr stable. TSH normal now. Will discuss on OV

## 2019-08-17 ENCOUNTER — Telehealth: Payer: Self-pay

## 2019-08-17 NOTE — Telephone Encounter (Signed)
Called patient and left a message to call back.

## 2019-08-17 NOTE — Telephone Encounter (Signed)
-----   Message from Adrian Prows, MD sent at 08/10/2019  8:33 AM EDT ----- Regarding: Pacemaker Normal function. JG

## 2019-08-18 ENCOUNTER — Ambulatory Visit: Payer: Medicare Other | Admitting: Cardiology

## 2019-08-24 ENCOUNTER — Other Ambulatory Visit: Payer: Self-pay | Admitting: Cardiology

## 2019-08-24 DIAGNOSIS — I10 Essential (primary) hypertension: Secondary | ICD-10-CM

## 2019-08-29 NOTE — Progress Notes (Deleted)
Primary Physician/Referring:  [name], MD  Patient ID: [name] [name], male    DOB: 1935/10/28, 84 y.o.   MRN: [id number]  No chief complaint on file.  HPI:    [name] [name]  is a 84 y.o. African-American male with history of paroxysmal atrial flutter status post ablation but had recurrence of atrial flutter and hence has been on anticoagulation, sick sinus syndrome S/P pacemaker implantation in 2013, also has had atrial fibrillation on pacemaker interrogation.  Has prostate cancer and chemotherapy was discontinued due to recurrent episodes of severe hypotension.  He has had no further dehydration and syncope.  *** He is tolerating Eliquis without bleeding complications.  Feels well and essentially asymptomatic.  He is here for management of hypertension, atrial fibrillation and renal issues.   ***  Past Medical History:  Diagnosis Date  . AKI (acute kidney injury) ([place]) 06/08/2018  . Atrial flutter ([place])   . Carcinoma of prostate ([place]) 12/30/2011   Treated with seed/radiation therapy.   . Colostomy in place [facility])   . Difficulty sleeping    lives in shelter currently  . Diverticulitis   . Dyslipidemia 12/30/2011  . Encounter for care of pacemaker 06/17/2018  . Frequency of urination   . Heart murmur   . Hypertension 04/30/11   Cardioversion 06/16/11  . Near syncope 06/07/2018  . Nocturia   . Orthostatic hypotension 06/08/2018  . Pacemaker 7/13    sick sinus syndrome/St Jude pacemaker  . Paroxysmal atrial flutter ([place]) 06/16/2011  . Prostate cancer ([place])   . Sick sinus syndrome ([place]) 11/28/2011  . Syncope and collapse 11/20/2011   Atrial and ventricular standstill > 3 seconds.   . Thyroid disease    had "overactive thyroid in 1997" - no known problem since   Past Surgical History:  Procedure Laterality Date  . ATRIAL FLUTTER ABLATION N/A 11/21/2011   Procedure: ATRIAL FLUTTER ABLATION;  Surgeon: Thompson Grayer, MD;  Location: [facility] CATH LAB;  Service: Cardiovascular;  Laterality: N/A;    . CARDIAC ELECTROPHYSIOLOGY STUDY AND ABLATION  7/13  . CARDIOVERSION  06/16/2011   Procedure: CARDIOVERSION;  Surgeon: Laverda Page, MD;  Location: [facility];  Service: Cardiovascular;  Laterality: N/A;  . COLON SURGERY  05/13/2013  . COLOSTOMY CLOSURE N/A 11/25/2013   Procedure: COLOSTOMY CLOSURE;  Surgeon: Earnstine Regal, MD;  Location: [facility];  Service: General;  Laterality: N/A;  . COLOSTOMY CLOSURE  11/25/2013  . PACEMAKER INSERTION  11/28/11   SJM Accent DR RF implanted by Dr Rayann Heman  . PARTIAL COLECTOMY N/A 05/13/2013   Procedure: sigmoid COLECTOMY  colostomy ;  Surgeon: Earnstine Regal, MD;  Location: [facility];  Service: General;  Laterality: N/A;  . PERMANENT PACEMAKER INSERTION N/A 11/28/2011   Procedure: PERMANENT PACEMAKER INSERTION;  Surgeon: Thompson Grayer, MD;  Location: [facility] CATH LAB;  Service: Cardiovascular;  Laterality: N/A;  . RADIOACTIVE SEED IMPLANT    . TONSILLECTOMY     Social History   Tobacco Use  . Smoking status: Former Smoker    Packs/day: 0.50    Years: 50.00    Pack years: 25.00    Types: Cigarettes    Quit date: 07/20/2009    Years since quitting: 10.1  . Smokeless tobacco: Never Used  Substance Use Topics  . Alcohol use: No   Marital Status: Single  ROS  Review of Systems  Constitution: Negative for weight gain.  Cardiovascular: Negative for dyspnea on exertion, leg swelling and syncope.  Respiratory: Negative for hemoptysis.  Endocrine: Negative for cold intolerance.  Hematologic/Lymphatic: Does not bruise/bleed easily.  Gastrointestinal: Negative for hematochezia and melena.  Neurological: Negative for headaches and light-headedness.   Objective  There were no vitals taken for this visit.  Vitals with BMI 07/07/2019 05/20/2019 04/27/2019  Height 5\' 7"  5\' 7"  -  Weight 173 lbs 167 lbs 8 oz -  BMI 38.75 64.33 -  Systolic 295 188 416  Diastolic 67 606 71  Pulse 66 60 66     Physical Exam  Constitutional: He appears well-developed and well-nourished.  No distress.  HENT:  Head: Atraumatic.  Eyes: Conjunctivae are normal.  Neck: No JVD present. No thyromegaly present.  Cardiovascular: Normal rate, regular rhythm, normal heart sounds and intact distal pulses. Exam reveals no gallop.  No murmur heard. Pulses:      Carotid pulses are 2+ on the right side and 2+ on the left side.      Radial pulses are 2+ on the right side and 2+ on the left side.       Femoral pulses are 2+ on the right side and 2+ on the left side.      Popliteal pulses are 2+ on the right side and 2+ on the left side.       Dorsalis pedis pulses are 1+ on the right side and 1+ on the left side.       Posterior tibial pulses are 1+ on the right side and 1+ on the left side.  Pulmonary/Chest: Effort normal and breath sounds normal.  Pacemaker/ICD site noted  in the left infraclavicular fossa.    Abdominal: Soft. Bowel sounds are normal.  Musculoskeletal:        General: No edema. Normal range of motion.     Cervical back: Neck supple.  Neurological: He is alert.  Skin: Skin is warm and dry.  Psychiatric: He has a normal mood and affect.   Laboratory examination:   Recent Labs    04/26/19 0523 04/27/19 0503 07/29/19 1329  NA 135 137 143  K 3.5 3.6 3.9  CL 108 109 103  CO2 18* 18* 19*  GLUCOSE 134* 106* 121*  BUN 76* 56* 20  CREATININE 2.60* 1.60* 1.70*  CALCIUM 8.3* 8.2* 9.5  GFRNONAA 22* 40* 37*  GFRAA 26* 46* 43*   CrCl cannot be calculated (Patient's most recent lab result is older than the maximum 21 days allowed.).  CMP Latest Ref Rng & Units 07/29/2019 04/27/2019 04/26/2019  Glucose 65 - 99 mg/dL 121(H) 106(H) 134(H)  BUN 8 - 27 mg/dL 20 56(H) 76(H)  Creatinine 0.76 - 1.27 mg/dL 1.70(H) 1.60(H) 2.60(H)  Sodium 134 - 144 mmol/L 143 137 135  Potassium 3.5 - 5.2 mmol/L 3.9 3.6 3.5  Chloride 96 - 106 mmol/L 103 109 108  CO2 20 - 29 mmol/L 19(L) 18(L) 18(L)  Calcium 8.6 - 10.2 mg/dL 9.5 8.2(L) 8.3(L)  Total Protein 6.5 - 8.1 g/dL - - -  Total  Bilirubin 0.3 - 1.2 mg/dL - - -  Alkaline Phos 38 - 126 U/L - - -  AST 15 - 41 U/L - - -  ALT 0 - 44 U/L - - -   CBC Latest Ref Rng & Units 04/25/2019 04/24/2019 03/03/2019  WBC 4.0 - 10.5 K/uL 10.2 9.4 9.5  Hemoglobin 13.0 - 17.0 g/dL 14.5 19.1(H) 10.4(L)  Hematocrit 39.0 - 52.0 % 44.6 56.0(H) 30.5(L)  Platelets 150 - 400 K/uL 299 334 195   Lipid Panel  No results found for:  CHOL, TRIG, HDL, CHOLHDL, VLDL, LDLCALC, LDLDIRECT HEMOGLOBIN A1C Lab Results  Component Value Date   HGBA1C 6.5 (H) 03/01/2019   MPG 139.85 03/01/2019   TSH Recent Labs    10/27/18 1535 03/02/19 1600 07/29/19 1329  TSH 0.361 0.295* 0.987   External Labs: ***   Medications and allergies   Allergies  Allergen Reactions  . Xarelto [Rivaroxaban] Other (See Comments)    "I didn't like the way it made me feel" (patient didn't elaborate)     Current Outpatient Medications  Medication Instructions  . apixaban (ELIQUIS) 2.5 mg, Oral, 2 times daily  . atorvastatin (LIPITOR) 20 MG tablet TAKE 1 TABLET BY MOUTH DAILY  . diltiazem (CARDIZEM CD) 180 MG 24 hr capsule TAKE 1 CAPSULE(180 MG) BY MOUTH DAILY  . hydrALAZINE (APRESOLINE) 25 mg, Oral, 3 times daily  . isosorbide dinitrate (ISORDIL) 30 mg, Oral, 3 times daily  . metoprolol tartrate (LOPRESSOR) 50 mg, Oral, Daily  . Multiple Vitamin (MULTIVITAMIN WITH MINERALS) TABS tablet 1 tablet, Oral, Daily  . ondansetron (ZOFRAN) 4 mg, Oral, Every 8 hours PRN  . tamsulosin (FLOMAX) 0.4 mg, Oral, Daily at bedtime    Radiology:  No results found.  Cardiac Studies:   Echocardiogram 06/08/2018: 1. The left ventricle has hyperdynamic systolic function of >80%. The cavity size is normal. There is no increas ed left ventricular wall thickness. Echo evidence of normal diastolic filling patterns. Normal left ventricular filling pressures. 2. The aortic valve is tricuspid in structure. There is mild thickening and mild calcification of the aortic valve.   EKG:   ***04/24/19: Extreme tachycardia with wide complex, no further rhythm analysis attempted  Assessment   No diagnosis found.  Remote pacemaker check 12.28.20: There were 5061 atrial high rate episodes detected. The longest lasted 00:02:50:04 in duration. EGMs show Aflutter, A lead noise, and false mode switches. There was a 5.6 % cumulative atrial arrhythmia burden. There were 65 high ventricular rate episodes detected. EGMs show a fast response to AF. Health trends do not demonstrate significant abnormality. Battery longevity is 2.8 years. RA pacing is 90.0 %, RV pacing is 3.9 %.   Scheduled In office pacemaker check 05/20/19: Presenting A paced V sensed.  There were 440 to AMS episodes.  Very brief lasting <2 minutes.  No high ventricular rate episodes.  EGM's reveal atrial flutter/atrial fibrillation. AF burden <1%.  Lead impedance and thresholds are within normal limits. AP 96% and VP 2.4%. Normal pacemaker function.    No orders of the defined types were placed in this encounter.   There are no discontinued medications.   Recommendations:   ***[name] [name]  is a 84 y.o. African-American male with history of paroxysmal atrial flutter status post ablation but had recurrence of atrial flutter and hence has been on anticoagulation, sick sinus syndrome S/P pacemaker implantation in 2013, also has had atrial fibrillation on pacemaker interrogation.  Has prostate cancer and chemotherapy was discontinued due to recurrent episodes of severe hypotension.  He has had no further dehydration and syncope.   Presently doing well and tolerating anticoagulation well. He now has stage 3 CKD. Lipids managed by PCP. Patient's blood pressure is markedly uncontrolled, I restarted him back on diltiazem and metoprolol, in spite of this today the blood pressure is well controlled.  I will add isosorbide dinitrate 30 mg 3 times daily along with hydralazine 25 mg p.o. 3 times daily and have like to see him back in 6  weeks.  He had abnormal TSH, I  will repeat BMP along with TSH when he goes for lab draw for urology.  He has gained about 12 pounds in weight since discontinuing chemotherapy, advised him to watch out for the calories. ***   Adrian Prows, MD, [facility] 08/29/2019, 3:16 PM [facility]. [place] Pager: [phone] Office: [phone] If no answer Cell [phone]

## 2019-08-31 ENCOUNTER — Ambulatory Visit: Payer: Medicare Other | Admitting: Cardiology

## 2019-09-07 NOTE — Progress Notes (Signed)
Primary Physician/Referring:  [name], MD  Patient ID: [name] [name], male    DOB: 03-09-1936, 84 y.o.   MRN: [id number]  No chief complaint on file.  HPI:    [name] [name]  is a 84 y.o. African-American male with history of paroxysmal atrial flutter status post ablation but had recurrence of atrial flutter and hence has been on anticoagulation, sick sinus syndrome S/P pacemaker implantation in 2013, also has had atrial fibrillation on pacemaker interrogation.  Has prostate cancer and chemotherapy was discontinued due to recurrent episodes of severe hypotension.  He has had no further dehydration and syncope.   He is tolerating Eliquis without bleeding complications.  Feels well and essentially asymptomatic.  He is here for management of hypertension, atrial fibrillation and renal issues.  On his last office visit 6 weeks ago had started him on hydralazine and also isosorbide dinitrate for uncontrolled hypertension, he developed severe headache and discontinued both.  He now presents for follow-up.  Otherwise he remains asymptomatic and is doing well. Continues to have elevated blood pressure.  Past Medical History:  Diagnosis Date  . AKI (acute kidney injury) ([place]) 06/08/2018  . Atrial flutter ([place])   . Carcinoma of prostate ([place]) 12/30/2011   Treated with seed/radiation therapy.   . Colostomy in place [facility])   . Difficulty sleeping    lives in shelter currently  . Diverticulitis   . Dyslipidemia 12/30/2011  . Encounter for care of pacemaker 06/17/2018  . Frequency of urination   . Heart murmur   . Hypertension 04/30/11   Cardioversion 06/16/11  . Near syncope 06/07/2018  . Nocturia   . Orthostatic hypotension 06/08/2018  . Pacemaker 7/13    sick sinus syndrome/St Jude pacemaker  . Paroxysmal atrial flutter ([place]) 06/16/2011  . Prostate cancer ([place])   . Sick sinus syndrome ([place]) 11/28/2011  . Syncope and collapse 11/20/2011   Atrial and ventricular standstill > 3 seconds.   . Thyroid  disease    had "overactive thyroid in 1997" - no known problem since   Past Surgical History:  Procedure Laterality Date  . ATRIAL FLUTTER ABLATION N/A 11/21/2011   Procedure: ATRIAL FLUTTER ABLATION;  Surgeon: Thompson Grayer, MD;  Location: [facility] CATH LAB;  Service: Cardiovascular;  Laterality: N/A;  . CARDIAC ELECTROPHYSIOLOGY STUDY AND ABLATION  7/13  . CARDIOVERSION  06/16/2011   Procedure: CARDIOVERSION;  Surgeon: Laverda Page, MD;  Location: [facility];  Service: Cardiovascular;  Laterality: N/A;  . COLON SURGERY  05/13/2013  . COLOSTOMY CLOSURE N/A 11/25/2013   Procedure: COLOSTOMY CLOSURE;  Surgeon: Earnstine Regal, MD;  [facility];  Service: General;  Laterality: N/A;  . COLOSTOMY CLOSURE  11/25/2013  . PACEMAKER INSERTION  11/28/11   SJM Accent DR RF implanted by Dr Rayann Heman  . PARTIAL COLECTOMY N/A 05/13/2013   Procedure: sigmoid COLECTOMY  colostomy ;  Surgeon: Earnstine Regal, MD;  Location: [facility];  Service: General;  Laterality: N/A;  . PERMANENT PACEMAKER INSERTION N/A 11/28/2011   Procedure: PERMANENT PACEMAKER INSERTION;  Surgeon: Thompson Grayer, MD;  Location[facility] CATH LAB;  Service: Cardiovascular;  Laterality: N/A;  . RADIOACTIVE SEED IMPLANT    . TONSILLECTOMY     Social History   Tobacco Use  . Smoking status: Former Smoker    Packs/day: 0.50    Years: 50.00    Pack years: 25.00    Types: Cigarettes    Quit date: 07/20/2009    Years since quitting: 10.1  . Smokeless tobacco:  Never Used  Substance Use Topics  . Alcohol use: No   Marital Status: Single  ROS  Review of Systems  Cardiovascular: Negative for chest pain, dyspnea on exertion and leg swelling.  Gastrointestinal: Negative for melena.   Objective  Blood pressure (!) 202/103, pulse 75, temperature (!) 97.3 F (36.3 C), height 5\' 7"  (1.702 m), weight 166 lb (75.3 kg), SpO2 100 %.  Vitals with BMI 09/08/2019 07/07/2019 05/20/2019  Height 5\' 7"  5\' 7"  5\' 7"   Weight 166 lbs 173 lbs 167 lbs 8 oz  BMI 25.99 19.37 90.24   Systolic 097 353 299  Diastolic 242 67 683  Pulse 75 66 60     Physical Exam  Constitutional: He appears well-developed and well-nourished. No distress.  Neck: No JVD present.  Cardiovascular: Normal rate, regular rhythm, normal heart sounds and intact distal pulses. Exam reveals no gallop.  No murmur heard. Pulses:      Carotid pulses are 2+ on the right side and 2+ on the left side.      Radial pulses are 2+ on the right side and 2+ on the left side.       Femoral pulses are 2+ on the right side and 2+ on the left side.      Popliteal pulses are 2+ on the right side and 2+ on the left side.       Dorsalis pedis pulses are 1+ on the right side and 1+ on the left side.       Posterior tibial pulses are 1+ on the right side and 1+ on the left side.  No JVD, No pedal edema  Pulmonary/Chest: Effort normal and breath sounds normal.  Pacemaker/ICD site noted  in the left infraclavicular fossa.    Abdominal: Soft. Bowel sounds are normal.   Laboratory examination:   Recent Labs    04/26/19 0523 04/27/19 0503 07/29/19 1329  NA 135 137 143  K 3.5 3.6 3.9  CL 108 109 103  CO2 18* 18* 19*  GLUCOSE 134* 106* 121*  BUN 76* 56* 20  CREATININE 2.60* 1.60* 1.70*  CALCIUM 8.3* 8.2* 9.5  GFRNONAA 22* 40* 37*  GFRAA 26* 46* 43*   CrCl cannot be calculated (Patient's most recent lab result is older than the maximum 21 days allowed.).  CMP Latest Ref Rng & Units 07/29/2019 04/27/2019 04/26/2019  Glucose 65 - 99 mg/dL 121(H) 106(H) 134(H)  BUN 8 - 27 mg/dL 20 56(H) 76(H)  Creatinine 0.76 - 1.27 mg/dL 1.70(H) 1.60(H) 2.60(H)  Sodium 134 - 144 mmol/L 143 137 135  Potassium 3.5 - 5.2 mmol/L 3.9 3.6 3.5  Chloride 96 - 106 mmol/L 103 109 108  CO2 20 - 29 mmol/L 19(L) 18(L) 18(L)  Calcium 8.6 - 10.2 mg/dL 9.5 8.2(L) 8.3(L)  Total Protein 6.5 - 8.1 g/dL - - -  Total Bilirubin 0.3 - 1.2 mg/dL - - -  Alkaline Phos 38 - 126 U/L - - -  AST 15 - 41 U/L - - -  ALT 0 - 44 U/L - - -   CBC Latest  Ref Rng & Units 04/25/2019 04/24/2019 03/03/2019  WBC 4.0 - 10.5 K/uL 10.2 9.4 9.5  Hemoglobin 13.0 - 17.0 g/dL 14.5 19.1(H) 10.4(L)  Hematocrit 39.0 - 52.0 % 44.6 56.0(H) 30.5(L)  Platelets 150 - 400 K/uL 299 334 195   Lipid Panel  No results found for: CHOL, TRIG, HDL, CHOLHDL, VLDL, LDLCALC, LDLDIRECT HEMOGLOBIN A1C Lab Results  Component Value Date   HGBA1C 6.5 (  H) 03/01/2019   MPG 139.85 03/01/2019   TSH Recent Labs    10/27/18 1535 03/02/19 1600 07/29/19 1329  TSH 0.361 0.295* 0.987   Medications and allergies   Allergies  Allergen Reactions  . Xarelto [Rivaroxaban] Other (See Comments)    "I didn't like the way it made me feel" (patient didn't elaborate)     Current Outpatient Medications  Medication Instructions  . apixaban (ELIQUIS) 2.5 mg, Oral, 2 times daily  . atorvastatin (LIPITOR) 20 MG tablet TAKE 1 TABLET BY MOUTH DAILY  . diltiazem (CARDIZEM CD) 180 MG 24 hr capsule TAKE 1 CAPSULE(180 MG) BY MOUTH DAILY  . hydrALAZINE (APRESOLINE) 50 mg, Oral, 3 times daily  . metoprolol tartrate (LOPRESSOR) 50 mg, Oral, 2 times daily  . Multiple Vitamin (MULTIVITAMIN WITH MINERALS) TABS tablet 1 tablet, Oral, Daily  . olmesartan (BENICAR) 20 mg, Oral, Daily  . tamsulosin (FLOMAX) 0.4 mg, Oral, Daily at bedtime    Radiology:  No results found.  Cardiac Studies:   Echocardiogram 06/08/2018: 1. The left ventricle has hyperdynamic systolic function of >19%. The cavity size is normal. There is no increas ed left ventricular wall thickness. Echo evidence of normal diastolic filling patterns. Normal left ventricular filling pressures. 2. The aortic valve is tricuspid in structure. There is mild thickening and mild calcification of the aortic valve.   Scheduled Remote pacemaker check 08/09/2019: There were 1609 atrial high rate episodes detected. The longest lasted 00:00:32:14 in duration (episode unavailable). Review of EGMs demonstrates paroxysmal atrial fibrillation /  tachycardia and an episode of false mode switch due to atrial signal falling into refractory period. There was a <1 % cumulative atrial arrhythmia burden (reduced from 5% previously). No VHR episodes.  Battery longevity is 2.5 years. RA pacing is 97.0 %, RV pacing is 3.7 %.  Scheduled In office pacemaker check 05/20/2019: Presenting A paced V sensed.  There were 440 to AMS episodes.  Very brief lasting <2 minutes.  No high ventricular rate episodes.  EGM's reveal atrial flutter/atrial fibrillation. AF burden <1%.  Lead impedance and thresholds are within normal limits. AP 96% and VP 2.4%. Normal pacemaker function.   EKG:  EKG 09/08/2019: Possibly ectopic atrial rhythm at the rate of 64 bpm, normal axis, incomplete right branch block.  Otherwise normal EKG.  04/24/19: Ectopic atrial tachycardia with wide complex, no further rhythm analysis attempted  Assessment     ICD-10-CM   1. Paroxysmal atrial flutter (HCC) CHA2DS2-VASc Score is 3.0.  I48.92 EKG 12-Lead    metoprolol tartrate (LOPRESSOR) 50 MG tablet  2. Essential hypertension, benign  I10 EKG 12-Lead    hydrALAZINE (APRESOLINE) 50 MG tablet    metoprolol tartrate (LOPRESSOR) 50 MG tablet  3. Stage 3b chronic kidney disease  N18.32   4. Pacemaker [place].1  Z95.0     Meds ordered this encounter  Medications  . hydrALAZINE (APRESOLINE) 50 MG tablet    Sig: Take 1 tablet (50 mg total) by mouth 3 (three) times daily.    Dispense:  90 tablet    Refill:  3  . metoprolol tartrate (LOPRESSOR) 50 MG tablet    Sig: Take 1 tablet (50 mg total) by mouth 2 (two) times daily.    Dispense:  180 tablet    Refill:  3    Medications Discontinued During This Encounter  Medication Reason  . ondansetron (ZOFRAN) 4 MG tablet No longer needed (for PRN medications)  . isosorbide dinitrate (ISORDIL) 30 MG tablet Side effect (  s)  . metoprolol tartrate (LOPRESSOR) 50 MG tablet   . hydrALAZINE (APRESOLINE) 25 MG tablet Reorder      Recommendations:   Jaykwon Morones  is a 84 y.o. African-American male with history of paroxysmal atrial flutter status post ablation but had recurrence of atrial flutter and hence has been on anticoagulation, sick sinus syndrome S/P pacemaker implantation in 2013, also has had atrial fibrillation on pacemaker interrogation.  Has prostate cancer and chemotherapy was discontinued due to recurrent episodes of severe hypotension.  He has had no further dehydration and syncope.   Has stage III chronic kidney disease has remained stable and slightly improved, no bleeding diathesis on Eliquis.  Today his back in what appears to be either a ectopic atrial rhythm versus possibly sinus rhythm but with first-degree AV block.  No clinical evidence of heart failure.  Hypertension continues to be uncontrolled, he could not tolerate isosorbide dinitrate due to severe headache, but restarted him back on hydralazine at 50 mg p.o. t.i.d.  He is also taking metoprolol tartrate 50 mg once a day, will increase it to b.i.d. dosing as well.  I'd like to see him back in 6 weeks for follow-up of hypertension.  High-dose beta blockers is probably appropriate in view of underlying atrial arrhythmias and also he has a permanent pacemaker.  Pacemaker is functioning normally.  Adrian Prows, MD, [facility] 09/08/2019, 1:20 PM [facility] [place] Pager: [phone] Office: [phone]

## 2019-09-08 ENCOUNTER — Encounter: Payer: Self-pay | Admitting: Cardiology

## 2019-09-08 ENCOUNTER — Ambulatory Visit: Payer: Medicare Other | Admitting: Cardiology

## 2019-09-08 ENCOUNTER — Other Ambulatory Visit: Payer: Self-pay

## 2019-09-08 VITALS — BP 202/103 | HR 75 | Temp 97.3°F | Ht 67.0 in | Wt 166.0 lb

## 2019-09-08 DIAGNOSIS — Z950 Presence of cardiac pacemaker: Secondary | ICD-10-CM

## 2019-09-08 DIAGNOSIS — N1832 Chronic kidney disease, stage 3b: Secondary | ICD-10-CM

## 2019-09-08 DIAGNOSIS — I4892 Unspecified atrial flutter: Secondary | ICD-10-CM

## 2019-09-08 DIAGNOSIS — I10 Essential (primary) hypertension: Secondary | ICD-10-CM

## 2019-09-08 MED ORDER — METOPROLOL TARTRATE 50 MG PO TABS: 50.0000 mg | ORAL_TABLET | Freq: Two times a day (BID) | ORAL | 3 refills | Status: DC

## 2019-09-08 MED ORDER — HYDRALAZINE HCL 50 MG PO TABS: 50.0000 mg | ORAL_TABLET | Freq: Three times a day (TID) | ORAL | 3 refills | Status: DC

## 2019-09-09 ENCOUNTER — Ambulatory Visit: Payer: Medicare Other | Admitting: Cardiology

## 2019-10-19 ENCOUNTER — Other Ambulatory Visit: Payer: Self-pay | Admitting: Cardiology

## 2019-10-19 DIAGNOSIS — I10 Essential (primary) hypertension: Secondary | ICD-10-CM

## 2019-10-20 ENCOUNTER — Other Ambulatory Visit: Payer: Self-pay

## 2019-10-20 ENCOUNTER — Encounter: Payer: Self-pay | Admitting: Cardiology

## 2019-10-20 ENCOUNTER — Ambulatory Visit: Payer: Medicare Other | Admitting: Cardiology

## 2019-10-20 VITALS — BP 199/98 | HR 60 | Resp 16 | Ht 67.0 in | Wt 168.8 lb

## 2019-10-20 DIAGNOSIS — N1832 Chronic kidney disease, stage 3b: Secondary | ICD-10-CM

## 2019-10-20 DIAGNOSIS — Z950 Presence of cardiac pacemaker: Secondary | ICD-10-CM

## 2019-10-20 DIAGNOSIS — I10 Essential (primary) hypertension: Secondary | ICD-10-CM

## 2019-10-20 MED ORDER — OLMESARTAN MEDOXOMIL-HCTZ 40-25 MG PO TABS: 1.0000 | ORAL_TABLET | Freq: Every day | ORAL | 3 refills | Status: DC

## 2019-10-20 NOTE — Progress Notes (Signed)
Primary Physician/Referring:  [name], MD  Patient ID: [name] [name], male    DOB: 1936/04/16, 84 y.o.   MRN: [id number]  Chief Complaint  Patient presents with  . Hypertension  . Atrial Fibrillation  . Follow-up    6 week   HPI:    [name] [name]  is a 84 y.o. African-American male with history of paroxysmal atrial flutter status post ablation but had recurrence of atrial flutter and hence has been on anticoagulation, sick sinus syndrome S/P pacemaker implantation in 2013, also has had atrial fibrillation on pacemaker interrogation.  Has prostate cancer and chemotherapy was discontinued due to recurrent episodes of severe hypotension.  He has had no further dehydration and syncope.   He is tolerating Eliquis without bleeding complications.  Feels well and essentially asymptomatic.  He is here for management of hypertension, atrial fibrillation and renal issues.    He presents here for follow-up of difficult to control hypertension.  Since his medications were changed since chemotherapy and repeated episodes of hypotension and syncope, his blood pressure has been uncontrolled.  He has now completed his chemotherapy and or has discontinued his chemotherapy due to intolerance.  States that his prostate cancer is stable.  Past Medical History:  Diagnosis Date  . AKI (acute kidney injury) ([place]) 06/08/2018  . Atrial flutter ([place])   . Carcinoma of prostate ([place]) 12/30/2011   Treated with seed/radiation therapy.   . Colostomy in place [facility])   . Difficulty sleeping    lives in shelter currently  . Diverticulitis   . Dyslipidemia 12/30/2011  . Encounter for care of pacemaker 06/17/2018  . Frequency of urination   . Heart murmur   . Hypertension 04/30/11   Cardioversion 06/16/11  . Near syncope 06/07/2018  . Nocturia   . Orthostatic hypotension 06/08/2018  . Pacemaker 7/13    sick sinus syndrome/St Jude pacemaker  . Paroxysmal atrial flutter ([place]) 06/16/2011  . Prostate cancer ([place])   .  Sick sinus syndrome ([place]) 11/28/2011  . Syncope and collapse 11/20/2011   Atrial and ventricular standstill > 3 seconds.   . Thyroid disease    had "overactive thyroid in 1997" - no known problem since   Past Surgical History:  Procedure Laterality Date  . ATRIAL FLUTTER ABLATION N/A 11/21/2011   Procedure: ATRIAL FLUTTER ABLATION;  Surgeon: Thompson Grayer, MD;  Location: [facility] C[facility] LAB;  Service: Cardiovascular;  Laterality: N/A;  . CARDIAC ELECTROPHYSIOLOGY STUDY AND ABLATION  7/13  . CARDIOVERSION  06/16/2011   Procedure: CARDIOVERSION;  Surgeon: Laverda Page, MD;  Location: [place];  Service: Cardiovascular;  Laterality: N/A;  . COLON SURGERY  05/13/2013  . COLOSTOMY CLOSURE N/A 11/25/2013   Procedure: COLOSTOMY CLOSURE;  Surgeon: Earnstine Regal, MD;  [facility];  Service: General;  Laterality: N/A;  . COLOSTOMY CLOSURE  11/25/2013  . PACEMAKER INSERTION  11/28/11   SJM Accent DR RF implanted by Dr Rayann Heman  . PARTIAL COLECTOMY N/A 05/13/2013   Procedure: sigmoid COLECTOMY  colostomy ;  Surgeon: Earnstine Regal, MD;  Location: [facility]S;  Service: General;  Laterality: N/A;  . PERMANENT PACEMAKER INSERTION N/A 11/28/2011   Procedure: PERMANENT PACEMAKER INSERTION;  Surgeon: Thompson Grayer, MD;  Location: [facility] CATH LAB;  Service: Cardiovascular;  Laterality: N/A;  . RADIOACTIVE SEED IMPLANT    . TONSILLECTOMY     Social History   Tobacco Use  . Smoking status: Former Smoker    Packs/day: 0.50    Years: 50.00  Pack years: 25.00    Types: Cigarettes    Quit date: 07/20/2009    Years since quitting: 10.2  . Smokeless tobacco: Never Used  Substance Use Topics  . Alcohol use: No   Marital Status: Single  ROS  Review of Systems  Cardiovascular: Negative for chest pain, dyspnea on exertion and leg swelling.  Gastrointestinal: Negative for melena.   Objective  Blood pressure (!) 199/98, pulse 60, resp. rate 16, height 5\' 7"  (1.702 m), weight 168 lb 12.8 oz (76.6 kg), SpO2 100 %.  Vitals with  BMI 10/20/2019 10/20/2019 09/08/2019  Height - 5\' 7"  5\' 7"   Weight - 168 lbs 13 oz 166 lbs  BMI - 24.40 10.27  Systolic 253 664 403  Diastolic 98 97 474  Pulse 60 62 75     Physical Exam Constitutional:      General: He is not in acute distress.    Appearance: He is well-developed.  Neck:     Vascular: No JVD.  Cardiovascular:     Rate and Rhythm: Normal rate and regular rhythm.     Pulses: Intact distal pulses.          Carotid pulses are 2+ on the right side and 2+ on the left side.      Radial pulses are 2+ on the right side and 2+ on the left side.       Femoral pulses are 2+ on the right side and 2+ on the left side.      Popliteal pulses are 2+ on the right side and 2+ on the left side.       Dorsalis pedis pulses are 1+ on the right side and 1+ on the left side.       Posterior tibial pulses are 1+ on the right side and 1+ on the left side.     Heart sounds: Normal heart sounds. No murmur heard.  No gallop.      Comments: No JVD, No pedal edema Pulmonary:     Effort: Pulmonary effort is normal.     Breath sounds: Normal breath sounds.  Abdominal:     General: Bowel sounds are normal.     Palpations: Abdomen is soft.    Laboratory examination:   Recent Labs    04/26/19 0523 04/27/19 0503 07/29/19 1329  NA 135 137 143  K 3.5 3.6 3.9  CL 108 109 103  CO2 18* 18* 19*  GLUCOSE 134* 106* 121*  BUN 76* 56* 20  CREATININE 2.60* 1.60* 1.70*  CALCIUM 8.3* 8.2* 9.5  GFRNONAA 22* 40* 37*  GFRAA 26* 46* 43*   CrCl cannot be calculated (Patient's most recent lab result is older than the maximum 21 days allowed.).  CMP Latest Ref Rng & Units 07/29/2019 04/27/2019 04/26/2019  Glucose 65 - 99 mg/dL 121(H) 106(H) 134(H)  BUN 8 - 27 mg/dL 20 56(H) 76(H)  Creatinine 0.76 - 1.27 mg/dL 1.70(H) 1.60(H) 2.60(H)  Sodium 134 - 144 mmol/L 143 137 135  Potassium 3.5 - 5.2 mmol/L 3.9 3.6 3.5  Chloride 96 - 106 mmol/L 103 109 108  CO2 20 - 29 mmol/L 19(L) 18(L) 18(L)  Calcium 8.6 -  10.2 mg/dL 9.5 8.2(L) 8.3(L)  Total Protein 6.5 - 8.1 g/dL - - -  Total Bilirubin 0.3 - 1.2 mg/dL - - -  Alkaline Phos 38 - 126 U/L - - -  AST 15 - 41 U/L - - -  ALT 0 - 44 U/L - - -  CBC Latest Ref Rng & Units 04/25/2019 04/24/2019 03/03/2019  WBC 4.0 - 10.5 K/uL 10.2 9.4 9.5  Hemoglobin 13.0 - 17.0 g/dL 14.5 19.1(H) 10.4(L)  Hematocrit 39 - 52 % 44.6 56.0(H) 30.5(L)  Platelets 150 - 400 K/uL 299 334 195   Lipid Panel  No results found for: CHOL, TRIG, HDL, CHOLHDL, VLDL, LDLCALC, LDLDIRECT HEMOGLOBIN A1C Lab Results  Component Value Date   HGBA1C 6.5 (H) 03/01/2019   MPG 139.85 03/01/2019   TSH Recent Labs    10/27/18 1535 03/02/19 1600 07/29/19 1329  TSH 0.361 0.295* 0.987   Medications and allergies   Allergies  Allergen Reactions  . Xarelto [Rivaroxaban] Other (See Comments)    "I didn't like the way it made me feel" (patient didn't elaborate)     Current Outpatient Medications  Medication Instructions  . apixaban (ELIQUIS) 2.5 mg, Oral, 2 times daily  . atorvastatin (LIPITOR) 20 MG tablet TAKE 1 TABLET BY MOUTH DAILY  . diltiazem (CARDIZEM CD) 180 MG 24 hr capsule TAKE 1 CAPSULE(180 MG) BY MOUTH DAILY  . hydrALAZINE (APRESOLINE) 50 mg, Oral, 3 times daily  . metoprolol tartrate (LOPRESSOR) 50 mg, Oral, 2 times daily  . Multiple Vitamin (MULTIVITAMIN WITH MINERALS) TABS tablet 1 tablet, Oral, Daily  . olmesartan-hydrochlorothiazide (BENICAR HCT) 40-25 MG tablet 1 tablet, Oral, Daily  . tamsulosin (FLOMAX) 0.4 mg, Oral, Daily at bedtime    Radiology:  No results found.  Cardiac Studies:   Echocardiogram 06/08/2018: 1. The left ventricle has hyperdynamic systolic function of >08%. The cavity size is normal. There is no increas ed left ventricular wall thickness. Echo evidence of normal diastolic filling patterns. Normal left ventricular filling pressures. 2. The aortic valve is tricuspid in structure. There is mild thickening and mild calcification of the  aortic valve.   Scheduled Remote pacemaker check 08/09/2019: There were 1609 atrial high rate episodes detected. The longest lasted 00:00:32:14 in duration (episode unavailable). Review of EGMs demonstrates paroxysmal atrial fibrillation / tachycardia and an episode of false mode switch due to atrial signal falling into refractory period. There was a <1 % cumulative atrial arrhythmia burden (reduced from 5% previously). No VHR episodes.  Battery longevity is 2.5 years. RA pacing is 97.0 %, RV pacing is 3.7 %.  Scheduled In office pacemaker check 05/20/2019: Presenting A paced V sensed.  There were 440 to AMS episodes.  Very brief lasting <2 minutes.  No high ventricular rate episodes.  EGM's reveal atrial flutter/atrial fibrillation. AF burden <1%.  Lead impedance and thresholds are within normal limits. AP 96% and VP 2.4%. Normal pacemaker function.   EKG:  EKG 09/08/2019: Possibly ectopic atrial rhythm at the rate of 64 bpm, normal axis, incomplete right branch block.  Otherwise normal EKG.  04/24/19: Ectopic atrial tachycardia with wide complex, no further rhythm analysis attempted  Assessment     ICD-10-CM   1. Essential hypertension, benign  I10 olmesartan-hydrochlorothiazide (BENICAR HCT) 40-25 MG tablet    Basic metabolic panel    CBC  2. Pacemaker [place].1  Z95.0   3. Stage 3b chronic kidney disease  N18.32 CBC    Meds ordered this encounter  Medications  . olmesartan-hydrochlorothiazide (BENICAR HCT) 40-25 MG tablet    Sig: Take 1 tablet by mouth daily.    Dispense:  30 tablet    Refill:  3    Medications Discontinued During This Encounter  Medication Reason  . olmesartan (BENICAR) 20 MG tablet Change in therapy  Recommendations:   [name] [name]  is a 84 y.o. African-American male with history of paroxysmal atrial flutter status post ablation but had recurrence of atrial flutter and hence has been on anticoagulation, sick sinus syndrome S/P pacemaker  implantation in 2013, also has had atrial fibrillation on pacemaker interrogation.  Has prostate cancer and chemotherapy was discontinued due to recurrent episodes of severe hypotension.  He has had no further dehydration and syncope.   Has stage III chronic kidney disease has remained stable and slightly improved, no bleeding diathesis on Eliquis.    Hypertension continues to be uncontrolled, he could not tolerate isosorbide dinitrate due to severe headache, in spite of increasing his metoprolol tartrate from 50 mg daily to twice daily dosing and hydralazine from 25 mg to 50 mg 3 times daily, blood pressure tends to be uncontrolled.    I will take the liberty of increasing his Benicar and changing to Benicar HCT 40/25 mg every morning, will obtain BMP in 2 weeks especially in view of stage III chronic kidney disease.  I will keep a close eye on this.  I would like to see him back in 3 to 4 weeks for follow-up.  I may consider challenging him with isosorbide dinitrate on his next office visit if blood pressure is not controlled.  His pacemaker is functioning normally.  He has not had any breakthrough significant atrial fibrillation.  Adrian Prows, MD, [facility] 10/20/2019, 3:03 PM [facility] Pager: [phone] Office: [phone]

## 2019-11-18 ENCOUNTER — Ambulatory Visit: Payer: Medicare Other | Admitting: Cardiology

## 2019-11-19 ENCOUNTER — Telehealth: Payer: Self-pay | Admitting: Cardiology

## 2019-11-22 NOTE — Telephone Encounter (Signed)
Called patient, NA, LMAM regarding normal pcaemaker check results.

## 2019-12-30 LAB — CBC
Hematocrit: 39.9 % (ref 37.5–51.0)
Hemoglobin: 13.3 g/dL (ref 13.0–17.7)
MCH: 30.5 pg (ref 26.6–33.0)
MCHC: 33.3 g/dL (ref 31.5–35.7)
MCV: 92 fL (ref 79–97)
Platelets: 254 10*3/uL (ref 150–450)
RBC: 4.36 x10E6/uL (ref 4.14–5.80)
RDW: 14.8 % (ref 11.6–15.4)
WBC: 5.1 10*3/uL (ref 3.4–10.8)

## 2019-12-30 LAB — BASIC METABOLIC PANEL
BUN/Creatinine Ratio: 10 (ref 10–24)
BUN: 15 mg/dL (ref 8–27)
CO2: 23 mmol/L (ref 20–29)
Calcium: 9.4 mg/dL (ref 8.6–10.2)
Chloride: 103 mmol/L (ref 96–106)
Creatinine, Ser: 1.55 mg/dL — ABNORMAL HIGH (ref 0.76–1.27)
GFR calc Af Amer: 48 mL/min/{1.73_m2} — ABNORMAL LOW (ref >59)
GFR calc non Af Amer: 41 mL/min/{1.73_m2} — ABNORMAL LOW (ref >59)
Glucose: 120 mg/dL — ABNORMAL HIGH (ref 65–99)
Potassium: 4.3 mmol/L (ref 3.5–5.2)
Sodium: 141 mmol/L (ref 134–144)

## 2020-01-01 NOTE — Progress Notes (Signed)
Stable renal function and normal blood counts

## 2020-01-02 ENCOUNTER — Ambulatory Visit: Payer: Medicare Other | Admitting: Cardiology

## 2020-01-02 ENCOUNTER — Other Ambulatory Visit: Payer: Self-pay

## 2020-01-02 ENCOUNTER — Encounter: Payer: Self-pay | Admitting: Cardiology

## 2020-01-02 VITALS — BP 133/61 | HR 67 | Resp 16 | Ht 67.0 in | Wt 155.8 lb

## 2020-01-02 DIAGNOSIS — I4892 Unspecified atrial flutter: Secondary | ICD-10-CM

## 2020-01-02 DIAGNOSIS — I10 Essential (primary) hypertension: Secondary | ICD-10-CM

## 2020-01-02 DIAGNOSIS — Z950 Presence of cardiac pacemaker: Secondary | ICD-10-CM

## 2020-01-02 DIAGNOSIS — N1831 Chronic kidney disease, stage 3a: Secondary | ICD-10-CM

## 2020-01-02 NOTE — Progress Notes (Signed)
Primary Physician/Referring:  [name], MD  Patient ID: [name] [name], male    DOB: 08-10-1935, 84 y.o.   MRN: [id number]  Chief Complaint  Patient presents with  . Hypertension  . Paroxysmal Atrial Fibrillation  . Follow-up    4 week   HPI:    [name] [name]  is a 84 y.o. African-American male with history of paroxysmal atrial flutter status post ablation but had recurrence of atrial flutter and hence has been on anticoagulation, sick sinus syndrome S/P pacemaker implantation in 2013, also has had atrial fibrillation on pacemaker interrogation.  Has prostate cancer and chemotherapy was discontinued due to recurrent episodes of severe hypotension.  He has had no further dehydration and syncope.   He is tolerating Eliquis without bleeding complications.  Feels well and essentially asymptomatic.  He is here for management of hypertension, atrial fibrillation and renal issues.  States that with the present blood pressure medications, he has felt well and is tolerating them well and is feeling the best he has in quite a while with good blood pressure control.  Denies chest pain or palpitations.   Past Medical History:  Diagnosis Date  . AKI (acute kidney injury) ([place]) 06/08/2018  . Atrial flutter ([place])   . Carcinoma of prostate ([place]) 12/30/2011   Treated with seed/radiation therapy.   . Colostomy in place [facility])   . Difficulty sleeping    lives in shelter currently  . Diverticulitis   . Dyslipidemia 12/30/2011  . Encounter for care of pacemaker 06/17/2018  . Frequency of urination   . Heart murmur   . Hypertension 04/30/11   Cardioversion 06/16/11  . Near syncope 06/07/2018  . Nocturia   . Orthostatic hypotension 06/08/2018  . Pacemaker 7/13    sick sinus syndrome/St Jude pacemaker  . Paroxysmal atrial flutter ([place]) 06/16/2011  . Prostate cancer ([place])   . Sick sinus syndrome ([place]) 11/28/2011  . Syncope and collapse 11/20/2011   Atrial and ventricular standstill > 3 seconds.   . Thyroid  disease    had "overactive thyroid in 1997" - no known problem since   Past Surgical History:  Procedure Laterality Date  . ATRIAL FLUTTER ABLATION N/A 11/21/2011   Procedure: ATRIAL FLUTTER ABLATION;  Surgeon: Thompson Grayer, MD;  Location: [facility] CATH LAB;  Service: Cardiovascular;  Laterality: N/A;  . CARDIAC ELECTROPHYSIOLOGY STUDY AND ABLATION  7/13  . CARDIOVERSION  06/16/2011   Procedure: CARDIOVERSION;  Surgeon: Laverda Page, MD;  Location: [place];  Service: Cardiovascular;  Laterality: N/A;  . COLON SURGERY  05/13/2013  . COLOSTOMY CLOSURE N/A 11/25/2013   Procedure: COLOSTOMY CLOSURE;  Surgeon: Earnstine Regal, MD;  [facility];  Service: General;  Laterality: N/A;  . COLOSTOMY CLOSURE  11/25/2013  . PACEMAKER INSERTION  11/28/11   SJM Accent DR RF implanted by Dr Rayann Heman  . PARTIAL COLECTOMY N/A 05/13/2013   Procedure: sigmoid COLECTOMY  colostomy ;  Surgeon: Earnstine Regal, MD;  [facility];  Service: General;  Laterality: N/A;  . PERMANENT PACEMAKER INSERTION N/A 11/28/2011   Procedure: PERMANENT PACEMAKER INSERTION;  Surgeon: Thompson Grayer, MD;  Location: [facility] CATH LAB;  Service: Cardiovascular;  Laterality: N/A;  . RADIOACTIVE SEED IMPLANT    . TONSILLECTOMY     Social History   Tobacco Use  . Smoking status: Former Smoker    Packs/day: 0.50    Years: 50.00    Pack years: 25.00    Types: Cigarettes    Quit date: 07/20/2009  Years since quitting: 10.4  . Smokeless tobacco: Never Used  Substance Use Topics  . Alcohol use: No   Marital Status: Single  ROS  Review of Systems  Cardiovascular: Negative for chest pain, dyspnea on exertion and leg swelling.  Gastrointestinal: Negative for melena.   Objective  Blood pressure 133/61, pulse 67, resp. rate 16, height 5\' 7"  (1.702 m), weight 155 lb 12.8 oz (70.7 kg), SpO2 99 %.  Vitals with BMI 01/02/2020 10/20/2019 10/20/2019  Height 5\' 7"  - 5\' 7"   Weight 155 lbs 13 oz - 168 lbs 13 oz  BMI 17.4 - 94.49  Systolic 675 916 384   Diastolic 61 98 97  Pulse 67 60 62     Physical Exam Constitutional:      General: He is not in acute distress.    Appearance: He is well-developed.  Neck:     Vascular: No JVD.  Cardiovascular:     Rate and Rhythm: Normal rate and regular rhythm.     Pulses: Intact distal pulses.          Carotid pulses are 2+ on the right side and 2+ on the left side.      Radial pulses are 2+ on the right side and 2+ on the left side.       Femoral pulses are 2+ on the right side and 2+ on the left side.      Popliteal pulses are 2+ on the right side and 2+ on the left side.       Dorsalis pedis pulses are 1+ on the right side and 1+ on the left side.       Posterior tibial pulses are 1+ on the right side and 1+ on the left side.     Heart sounds: Normal heart sounds. No murmur heard.  No gallop.      Comments: No JVD, No pedal edema Pulmonary:     Effort: Pulmonary effort is normal.     Breath sounds: Normal breath sounds.  Abdominal:     General: Bowel sounds are normal.     Palpations: Abdomen is soft.    Laboratory examination:   Recent Labs    04/27/19 0503 07/29/19 1329 12/29/19 1312  NA 137 143 141  K 3.6 3.9 4.3  CL 109 103 103  CO2 18* 19* 23  GLUCOSE 106* 121* 120*  BUN 56* 20 15  CREATININE 1.60* 1.70* 1.55*  CALCIUM 8.2* 9.5 9.4  GFRNONAA 40* 37* 41*  GFRAA 46* 43* 48*   estimated creatinine clearance is 34.9 mL/min (A) (by C-G formula based on SCr of 1.55 mg/dL (H)).  CMP Latest Ref Rng & Units 12/29/2019 07/29/2019 04/27/2019  Glucose 65 - 99 mg/dL 120(H) 121(H) 106(H)  BUN 8 - 27 mg/dL 15 20 56(H)  Creatinine 0.76 - 1.27 mg/dL 1.55(H) 1.70(H) 1.60(H)  Sodium 134 - 144 mmol/L 141 143 137  Potassium 3.5 - 5.2 mmol/L 4.3 3.9 3.6  Chloride 96 - 106 mmol/L 103 103 109  CO2 20 - 29 mmol/L 23 19(L) 18(L)  Calcium 8.6 - 10.2 mg/dL 9.4 9.5 8.2(L)  Total Protein 6.5 - 8.1 g/dL - - -  Total Bilirubin 0.3 - 1.2 mg/dL - - -  Alkaline Phos 38 - 126 U/L - - -  AST 15 -  41 U/L - - -  ALT 0 - 44 U/L - - -   CBC Latest Ref Rng & Units 12/29/2019 04/25/2019 04/24/2019  WBC 3.4 - 10.8 x10E3/uL 5.1  10.2 9.4  Hemoglobin 13.0 - 17.7 g/dL 13.3 14.5 19.1(H)  Hematocrit 37.5 - 51.0 % 39.9 44.6 56.0(H)  Platelets 150 - 450 x10E3/uL 254 299 334   Lipid Panel  No results found for: CHOL, TRIG, HDL, CHOLHDL, VLDL, LDLCALC, LDLDIRECT HEMOGLOBIN A1C Lab Results  Component Value Date   HGBA1C 6.5 (H) 03/01/2019   MPG 139.85 03/01/2019   TSH Recent Labs    03/02/19 1600 07/29/19 1329  TSH 0.295* 0.987   Medications and allergies   Allergies  Allergen Reactions  . Xarelto [Rivaroxaban] Other (See Comments)    "I didn't like the way it made me feel" (patient didn't elaborate)     Outpatient Medications Prior to Visit  Medication Sig Dispense Refill  . apixaban (ELIQUIS) 2.5 MG TABS tablet Take 1 tablet (2.5 mg total) by mouth 2 (two) times daily. 60 tablet 1  . atorvastatin (LIPITOR) 20 MG tablet TAKE 1 TABLET BY MOUTH DAILY (Patient taking differently: 20 mg. ) 90 tablet 3  . diltiazem (CARDIZEM CD) 180 MG 24 hr capsule TAKE 1 CAPSULE(180 MG) BY MOUTH DAILY 90 capsule 3  . hydrALAZINE (APRESOLINE) 50 MG tablet Take 1 tablet (50 mg total) by mouth 3 (three) times daily. 90 tablet 3  . metoprolol tartrate (LOPRESSOR) 50 MG tablet Take 1 tablet (50 mg total) by mouth 2 (two) times daily. 180 tablet 3  . Multiple Vitamin (MULTIVITAMIN WITH MINERALS) TABS tablet Take 1 tablet by mouth daily. 90 tablet 0  . olmesartan-hydrochlorothiazide (BENICAR HCT) 40-25 MG tablet Take 1 tablet by mouth daily. 30 tablet 3  . Tamsulosin HCl (FLOMAX) 0.4 MG CAPS Take 0.4 mg by mouth at bedtime.      No facility-administered medications prior to visit.    Radiology:  No results found.  Cardiac Studies:   Echocardiogram 06/08/2018: 1. The left ventricle has hyperdynamic systolic function of >56%. The cavity size is normal. There is no increas ed left ventricular wall  thickness. Echo evidence of normal diastolic filling patterns. Normal left ventricular filling pressures. 2. The aortic valve is tricuspid in structure. There is mild thickening and mild calcification of the aortic valve.   Scheduled Remote pacemaker check 08/09/2019: There were 1609 atrial high rate episodes detected. The longest lasted 00:00:32:14 in duration (episode unavailable). Review of EGMs demonstrates paroxysmal atrial fibrillation / tachycardia and an episode of false mode switch due to atrial signal falling into refractory period. There was a <1 % cumulative atrial arrhythmia burden (reduced from 5% previously). No VHR episodes.  Battery longevity is 2.5 years. RA pacing is 97.0 %, RV pacing is 3.7 %.  Scheduled In office pacemaker check 05/20/2019: Presenting A paced V sensed.  There were 440 to AMS episodes.  Very brief lasting <2 minutes.  No high ventricular rate episodes.  EGM's reveal atrial flutter/atrial fibrillation. AF burden <1%.  Lead impedance and thresholds are within normal limits. AP 96% and VP 2.4%. Normal pacemaker function.   Scheduled Remote pacemaker check 11/13/2019: 1AHR episode. Review of EGM demonstrates false mode switch due to atrial signal falling into refractory period. Of note, when atrial pacing there was intermittent ventricular undersensing due V-blanking. Device also noted intermittent false loss of capture when ventricular pacing. There was a <1 % cumulative atrial arrhythmia burden. There were 0 high ventricular rate episodes detected. Health trends do not demonstrate significant abnormality. Battery longevity is 1.9 years. RA pacing is 97.0 %, RV pacing is 1.5 %.  EKG:  EKG 09/08/2019: Possibly ectopic atrial rhythm  at the rate of 64 bpm, normal axis, incomplete right branch block.  Otherwise normal EKG.  04/24/19: Ectopic atrial tachycardia with wide complex, no further rhythm analysis attempted  Assessment     ICD-10-CM   1. Essential  hypertension, benign  I10   2. Pacemaker [place].1  Z95.0   3. Paroxysmal atrial flutter (HCC) CHA2DS2-VASc Score is 3.0.  I48.92   4. Stage 3a chronic kidney disease  N18.31     No orders of the defined types were placed in this encounter.   There are no discontinued medications.   Recommendations:   Eliyah Bazzi  is a 84 y.o. African-American male with history of paroxysmal atrial flutter status post ablation but had recurrence of atrial flutter and hence has been on anticoagulation, sick sinus syndrome S/P pacemaker implantation in 2013, also has had atrial fibrillation on pacemaker interrogation.  Past medical history significant for stage III chronic kidney disease, prostate cancer and chemotherapy was discontinued due to recurrent episodes of severe hypotension.  He has had no further dehydration and syncope.   He is presently tolerating all his medications well and has not had any issues controlling his blood pressure.  He remains asymptomatic.  He is also tolerating anticoagulation without bleeding diathesis and is maintaining sinus rhythm.  Pacemaker data was reviewed with patient and reassured.  Reviewed his labs, renal function has remained stable.  No changes in the medications were done today, I will see him back in 6 months.  Patient is in Remote Patient Monitoring and Principal Care Management as patient is high risk for hospitalization and complications from underlying medical conditions.    Adrian Prows, MD, [facility] 01/02/2020, 5:23 PM Office: [phone]

## 2020-02-02 ENCOUNTER — Other Ambulatory Visit: Payer: Self-pay | Admitting: Cardiology

## 2020-02-23 ENCOUNTER — Other Ambulatory Visit: Payer: Self-pay | Admitting: Cardiology

## 2020-02-23 DIAGNOSIS — I10 Essential (primary) hypertension: Secondary | ICD-10-CM

## 2020-03-05 ENCOUNTER — Other Ambulatory Visit: Payer: Self-pay | Admitting: Cardiology

## 2020-04-24 ENCOUNTER — Other Ambulatory Visit: Payer: Self-pay

## 2020-04-24 DIAGNOSIS — I4892 Unspecified atrial flutter: Secondary | ICD-10-CM

## 2020-04-24 DIAGNOSIS — I10 Essential (primary) hypertension: Secondary | ICD-10-CM

## 2020-04-24 MED ORDER — ATORVASTATIN CALCIUM 20 MG PO TABS: 20.0000 mg | ORAL_TABLET | Freq: Every day | ORAL | 3 refills | Status: DC

## 2020-04-24 MED ORDER — OLMESARTAN MEDOXOMIL-HCTZ 40-25 MG PO TABS: 1.0000 | ORAL_TABLET | Freq: Every day | ORAL | 3 refills | Status: AC

## 2020-04-24 MED ORDER — DILTIAZEM HCL ER COATED BEADS 180 MG PO CP24: ORAL_CAPSULE | ORAL | 3 refills | Status: DC

## 2020-04-24 MED ORDER — METOPROLOL TARTRATE 50 MG PO TABS: 50.0000 mg | ORAL_TABLET | Freq: Two times a day (BID) | ORAL | 3 refills | Status: DC

## 2020-04-24 MED ORDER — HYDRALAZINE HCL 50 MG PO TABS: 50.0000 mg | ORAL_TABLET | Freq: Three times a day (TID) | ORAL | 3 refills | Status: DC

## 2020-04-24 MED ORDER — TAMSULOSIN HCL 0.4 MG PO CAPS: 0.4000 mg | ORAL_CAPSULE | Freq: Every day | ORAL | 1 refills | Status: DC

## 2020-04-24 MED ORDER — APIXABAN 2.5 MG PO TABS: 2.5000 mg | ORAL_TABLET | Freq: Two times a day (BID) | ORAL | 1 refills | Status: DC

## 2020-06-13 ENCOUNTER — Telehealth: Payer: Self-pay | Admitting: Cardiology

## 2020-06-14 NOTE — Telephone Encounter (Signed)
Called patient, NA, LMAM

## 2020-06-14 NOTE — Telephone Encounter (Signed)
Please follow up

## 2020-06-14 NOTE — Telephone Encounter (Signed)
Patient called, his adapter was not working, but he has had it replaced, and it still isn't working so they sent him a Production assistant, radio and it is still not transmitting. He is calling them today to see if they can have him manually transmit.

## 2020-06-15 NOTE — Telephone Encounter (Signed)
Patient transmitted on 06/14/2020.

## 2020-06-26 ENCOUNTER — Other Ambulatory Visit: Payer: Self-pay | Admitting: Cardiology

## 2020-07-02 ENCOUNTER — Ambulatory Visit: Payer: Medicare Other | Admitting: Cardiology

## 2020-07-16 ENCOUNTER — Other Ambulatory Visit: Payer: Self-pay

## 2020-07-16 ENCOUNTER — Ambulatory Visit: Payer: Medicare HMO | Admitting: Cardiology

## 2020-07-16 ENCOUNTER — Encounter: Payer: Self-pay | Admitting: Cardiology

## 2020-07-16 VITALS — BP 126/61 | HR 72 | Temp 98.5°F | Resp 16 | Ht 67.0 in | Wt 173.0 lb

## 2020-07-16 DIAGNOSIS — Z950 Presence of cardiac pacemaker: Secondary | ICD-10-CM

## 2020-07-16 DIAGNOSIS — I10 Essential (primary) hypertension: Secondary | ICD-10-CM

## 2020-07-16 DIAGNOSIS — I4892 Unspecified atrial flutter: Secondary | ICD-10-CM

## 2020-07-16 DIAGNOSIS — N1832 Chronic kidney disease, stage 3b: Secondary | ICD-10-CM

## 2020-07-16 DIAGNOSIS — I495 Sick sinus syndrome: Secondary | ICD-10-CM

## 2020-07-16 DIAGNOSIS — Z45018 Encounter for adjustment and management of other part of cardiac pacemaker: Secondary | ICD-10-CM

## 2020-07-16 NOTE — Progress Notes (Signed)
Primary Physician/Referring:  [name], MD  Patient ID: [name] [name], male    DOB: [birth year], 85 y.o.   MRN: [id number]  Chief Complaint  Patient presents with  . Atrial Fibrillation  . Hypertension  . Follow-up    6 month   HPI:    [name] [name]  is a 85 y.o. African-American male with history of paroxysmal atrial flutter status post ablation but had recurrence of atrial flutter and hence has been on anticoagulation, sick sinus syndrome S/P pacemaker implantation in 2013, also has had atrial fibrillation on pacemaker interrogation.  Has prostate cancer and chemotherapy was discontinued due to recurrent episodes of severe hypotension and syncope.   He is tolerating Eliquis without bleeding complications.  Feels well and essentially asymptomatic.  He is here for management of hypertension, atrial fibrillation and renal issues. Denies chest pain or palpitations.   Past Medical History:  Diagnosis Date  . AKI (acute kidney injury) ([place]) 06/08/2018  . Atrial flutter ([place])   . Carcinoma of prostate ([place]) 12/30/2011   Treated with seed/radiation therapy.   . Colostomy in place [facility])   . Difficulty sleeping    lives in shelter currently  . Diverticulitis   . Dyslipidemia 12/30/2011  . Encounter for care of pacemaker 06/17/2018  . Frequency of urination   . Heart murmur   . Hypertension 04/30/11   Cardioversion 06/16/11  . Near syncope 06/07/2018  . Nocturia   . Orthostatic hypotension 06/08/2018  . Pacemaker 7/13    sick sinus syndrome/St Jude pacemaker  . Paroxysmal atrial flutter ([place]) 06/16/2011  . Prostate cancer ([place])   . Sick sinus syndrome ([place]) 11/28/2011  . Syncope and collapse 11/20/2011   Atrial and ventricular standstill > 3 seconds.   . Thyroid disease    had "overactive thyroid in 1997" - no known problem since   Past Surgical History:  Procedure Laterality Date  . ATRIAL FLUTTER ABLATION N/A 11/21/2011   Procedure: ATRIAL FLUTTER ABLATION;  Surgeon: Thompson Grayer,  MD;  Location: [facility] CATH LAB;  Service: Cardiovascular;  Laterality: N/A;  . CARDIAC ELECTROPHYSIOLOGY STUDY AND ABLATION  7/13  . CARDIOVERSION  06/16/2011   Procedure: CARDIOVERSION;  Surgeon: Laverda Page, MD;  Location: [place];  Service: Cardiovascular;  Laterality: N/A;  . COLON SURGERY  05/13/2013  . COLOSTOMY CLOSURE N/A 11/25/2013   Procedure: COLOSTOMY CLOSURE;  Surgeon: Earnstine Regal, MD;  Location: [facility] ORS;  Service: General;  Laterality: N/A;  . COLOSTOMY CLOSURE  11/25/2013  . PACEMAKER INSERTION  11/28/11   SJM Accent DR RF implanted by Dr Rayann Heman  . PARTIAL COLECTOMY N/A 05/13/2013   Procedure: sigmoid COLECTOMY  colostomy ;  Surgeon: Earnstine Regal, MD;  Location: [facility] ORS;  Service: General;  Laterality: N/A;  . PERMANENT PACEMAKER INSERTION N/A 11/28/2011   Procedure: PERMANENT PACEMAKER INSERTION;  Surgeon: Thompson Grayer, MD;  Location: [facility] CATH LAB;  Service: Cardiovascular;  Laterality: N/A;  . RADIOACTIVE SEED IMPLANT    . TONSILLECTOMY     Social History   Tobacco Use  . Smoking status: Former Smoker    Packs/day: 0.50    Years: 50.00    Pack years: 25.00    Types: Cigarettes    Quit date: 07/20/2009    Years since quitting: 10.9  . Smokeless tobacco: Never Used  Substance Use Topics  . Alcohol use: No   Marital Status: Single  ROS  Review of Systems  Cardiovascular: Negative for chest pain, dyspnea on exertion and leg  swelling.  Gastrointestinal: Negative for melena.   Objective  Blood pressure 126/61, pulse 72, temperature 98.5 F (36.9 C), resp. rate 16, height 5\' 7"  (1.702 m), weight 173 lb (78.5 kg).  Vitals with BMI 07/16/2020 01/02/2020 10/20/2019  Height 5\' 7"  5\' 7"  -  Weight 173 lbs 155 lbs 13 oz -  BMI 78.24 23.5 -  Systolic 361 443 154  Diastolic 61 61 98  Pulse 72 67 60     Physical Exam Constitutional:      General: He is not in acute distress.    Appearance: He is well-developed.  Neck:     Vascular: No JVD.  Cardiovascular:     Rate and  Rhythm: Normal rate and regular rhythm.     Pulses: Intact distal pulses.          Carotid pulses are 2+ on the right side and 2+ on the left side.      Radial pulses are 2+ on the right side and 2+ on the left side.       Femoral pulses are 2+ on the right side and 2+ on the left side.      Popliteal pulses are 2+ on the right side and 2+ on the left side.       Dorsalis pedis pulses are 1+ on the right side and 1+ on the left side.       Posterior tibial pulses are 1+ on the right side and 1+ on the left side.     Heart sounds: Normal heart sounds. No murmur heard. No gallop.      Comments: No JVD, No pedal edema Pulmonary:     Effort: Pulmonary effort is normal.     Breath sounds: Normal breath sounds.  Abdominal:     General: Bowel sounds are normal.     Palpations: Abdomen is soft.    Laboratory examination:   Recent Labs    07/29/19 1329 12/29/19 1312  NA 143 141  K 3.9 4.3  CL 103 103  CO2 19* 23  GLUCOSE 121* 120*  BUN 20 15  CREATININE 1.70* 1.55*  CALCIUM 9.5 9.4  GFRNONAA 37* 41*  GFRAA 43* 48*   CrCl cannot be calculated (Patient's most recent lab result is older than the maximum 21 days allowed.).  CMP Latest Ref Rng & Units 12/29/2019 07/29/2019 04/27/2019  Glucose 65 - 99 mg/dL 120(H) 121(H) 106(H)  BUN 8 - 27 mg/dL 15 20 56(H)  Creatinine 0.76 - 1.27 mg/dL 1.55(H) 1.70(H) 1.60(H)  Sodium 134 - 144 mmol/L 141 143 137  Potassium 3.5 - 5.2 mmol/L 4.3 3.9 3.6  Chloride 96 - 106 mmol/L 103 103 109  CO2 20 - 29 mmol/L 23 19(L) 18(L)  Calcium 8.6 - 10.2 mg/dL 9.4 9.5 8.2(L)  Total Protein 6.5 - 8.1 g/dL - - -  Total Bilirubin 0.3 - 1.2 mg/dL - - -  Alkaline Phos 38 - 126 U/L - - -  AST 15 - 41 U/L - - -  ALT 0 - 44 U/L - - -   CBC Latest Ref Rng & Units 12/29/2019 04/25/2019 04/24/2019  WBC 3.4 - 10.8 x10E3/uL 5.1 10.2 9.4  Hemoglobin 13.0 - 17.7 g/dL 13.3 14.5 19.1(H)  Hematocrit 37.5 - 51.0 % 39.9 44.6 56.0(H)  Platelets 150 - 450 x10E3/uL 254 299 334    Lipid Panel  No results found for: CHOL, TRIG, HDL, CHOLHDL, VLDL, LDLCALC, LDLDIRECT HEMOGLOBIN A1C Lab Results  Component Value Date   HGBA1C  6.5 (H) 03/01/2019   MPG 139.85 03/01/2019   TSH Recent Labs    07/29/19 1329  TSH 0.987   Medications and allergies   Allergies  Allergen Reactions  . Xarelto [Rivaroxaban] Other (See Comments)    "I didn't like the way it made me feel" (patient didn't elaborate)     Outpatient Medications Prior to Visit  Medication Sig Dispense Refill  . atorvastatin (LIPITOR) 20 MG tablet Take 1 tablet (20 mg total) by mouth daily. 90 tablet 3  . diltiazem (CARDIZEM CD) 180 MG 24 hr capsule TAKE 1 CAPSULE(180 MG) BY MOUTH DAILY 90 capsule 3  . ELIQUIS 2.5 MG TABS tablet TAKE 1 TABLET TWICE DAILY 120 tablet 0  . hydrALAZINE (APRESOLINE) 50 MG tablet Take 1 tablet (50 mg total) by mouth 3 (three) times daily. 90 tablet 3  . metoprolol tartrate (LOPRESSOR) 50 MG tablet Take 1 tablet (50 mg total) by mouth 2 (two) times daily. 180 tablet 3  . Multiple Vitamin (MULTIVITAMIN WITH MINERALS) TABS tablet Take 1 tablet by mouth daily. 90 tablet 0  . olmesartan-hydrochlorothiazide (BENICAR HCT) 40-25 MG tablet Take 1 tablet by mouth daily. 30 tablet 3  . tamsulosin (FLOMAX) 0.4 MG CAPS capsule Take 1 capsule (0.4 mg total) by mouth at bedtime. 30 capsule 1   No facility-administered medications prior to visit.    Radiology:  No results found.  Cardiac Studies:   Echocardiogram 06/08/2018: 1. The left ventricle has hyperdynamic systolic function of >62%. The cavity size is normal. There is no increas ed left ventricular wall thickness. Echo evidence of normal diastolic filling patterns. Normal left ventricular filling pressures. 2. The aortic valve is tricuspid in structure. There is mild thickening and mild calcification of the aortic valve.   Scheduled Remote pacemaker check 08/09/2019: There were 1609 atrial high rate episodes detected. The longest  lasted 00:00:32:14 in duration (episode unavailable). Review of EGMs demonstrates paroxysmal atrial fibrillation / tachycardia and an episode of false mode switch due to atrial signal falling into refractory period. There was a <1 % cumulative atrial arrhythmia burden (reduced from 5% previously). No VHR episodes.  Battery longevity is 2.5 years. RA pacing is 97.0 %, RV pacing is 3.7 %.  Scheduled In office pacemaker check 05/20/2019: Presenting A paced V sensed.  There were 440 to AMS episodes.  Very brief lasting <2 minutes.  No high ventricular rate episodes.  EGM's reveal atrial flutter/atrial fibrillation. AF burden <1%.  Lead impedance and thresholds are within normal limits. AP 96% and VP 2.4%. Normal pacemaker function.   Scheduled Remote pacemaker check 06/14/2020:  11 AHR episode. Review of EGM demonstrates false mode switch due to atrial signal falling into refractory period and brief AT. Of note, when atrial pacing there was intermittent ventricular undersensing due V-blanking. Device also noted intermittent false loss of capture when ventricular pacing. There was a <1 % cumulative atrial arrhythmia burden. There were 0 high ventricular rate episodes detected. Health trends do not demonstrate significant abnormality. Battery longevity is 1.0 years. RA pacing is 91.0 %, RV pacing is 6.3 %  Scheduled  In office pacemaker check 07/16/20  Single (S)/Dual (D)/BV: D. Presenting APVS. Pacemaker dependant:  Yes. Underlying Silent atrium with no escape. AP 90%, VP 13%. AMS Episodes 1. 07/08/2020. PAC no significant arrhythmias.    HVR 0. Longevity 10 months. Magnet rate: >85%. Lead measurements: Stable. Histogram: Low (L)/normal (N)/high (H)  Normal. Patient activity Normal.   Observations: Normal pacemaker function. Changes: None. Pacemaker EOL  alert siren demonstrated to patient.   EKG:    EKG 07/16/2020: Atrially paced rhythm at rate of 68 bpm, normal axis, no evidence of ischemia.  PACs  (2).    No significant change from EKG 09/08/2019:   04/24/19: Ectopic atrial tachycardia with wide complex, no further rhythm analysis attempted  Assessment     ICD-10-CM   1. Encounter for care of pacemaker  Z45.018   2. Pacemaker-St.Jude Accent DR-RF dual chamber pacemaker   Z95.0   3. Sick sinus syndrome (HCC)  I49.5   4. Paroxysmal atrial flutter (HCC)  I48.92 EKG 12-Lead  5. Essential hypertension, benign  I10   6. Stage 3b chronic kidney disease (HCC)  N18.32     No orders of the defined types were placed in this encounter.   There are no discontinued medications.   Recommendations:   Nour Scalise  is a 85 y.o. African-American male with history of paroxysmal atrial flutter status post ablation but had recurrence of atrial flutter and hence has been on anticoagulation, sick sinus syndrome S/P pacemaker implantation in 2013, also has had atrial fibrillation on pacemaker interrogation.  Past medical history significant for stage III chronic kidney disease, prostate cancer and chemotherapy was discontinued due to recurrent episodes of severe hypotension and syncope.   He is presently tolerating all his medications well and has not had any issues controlling his blood pressure.  He remains asymptomatic.  He is also tolerating anticoagulation without bleeding diathesis and is maintaining sinus rhythm.  Pacemaker data was reviewed with patient and reassured.  He is presently on chemotherapy for prostate cancer, I do not know which agent he is presently on, states that he takes IV once every 4 months.  Stable from cardiac standpoint, I will see him back in 6 months as he is pacer dependent and his battery life is heading towards end-of-life.  No changes in the medications were done today.  He will continue doing remote monitoring.    Adrian Prows, MD, [facility] 07/16/2020, 11:24 AM Office: [phone]

## 2020-08-08 ENCOUNTER — Other Ambulatory Visit: Payer: Self-pay | Admitting: Cardiology

## 2020-09-03 ENCOUNTER — Other Ambulatory Visit: Payer: Self-pay | Admitting: Cardiology

## 2021-01-04 ENCOUNTER — Other Ambulatory Visit: Payer: Self-pay | Admitting: Urology

## 2021-01-04 ENCOUNTER — Other Ambulatory Visit (HOSPITAL_COMMUNITY): Payer: Self-pay | Admitting: Urology

## 2021-01-04 DIAGNOSIS — R9721 Rising PSA following treatment for malignant neoplasm of prostate: Secondary | ICD-10-CM

## 2021-01-16 ENCOUNTER — Ambulatory Visit: Payer: Medicare HMO | Admitting: Cardiology

## 2021-01-16 ENCOUNTER — Other Ambulatory Visit: Payer: Self-pay

## 2021-01-16 ENCOUNTER — Encounter: Payer: Self-pay | Admitting: Cardiology

## 2021-01-16 VITALS — BP 116/67 | HR 79 | Temp 97.1°F | Resp 17 | Ht 67.0 in | Wt 161.6 lb

## 2021-01-16 DIAGNOSIS — I10 Essential (primary) hypertension: Secondary | ICD-10-CM

## 2021-01-16 DIAGNOSIS — R739 Hyperglycemia, unspecified: Secondary | ICD-10-CM

## 2021-01-16 DIAGNOSIS — Z950 Presence of cardiac pacemaker: Secondary | ICD-10-CM

## 2021-01-16 DIAGNOSIS — I495 Sick sinus syndrome: Secondary | ICD-10-CM

## 2021-01-16 DIAGNOSIS — I4892 Unspecified atrial flutter: Secondary | ICD-10-CM

## 2021-01-16 DIAGNOSIS — E7800 Pure hypercholesterolemia, unspecified: Secondary | ICD-10-CM

## 2021-01-16 NOTE — Progress Notes (Signed)
Primary Physician/Referring:  [name], MD  Patient ID: [name] [name], male    DOB: 08-23-1935, 85 y.o.   MRN: [id number]  Chief Complaint  Patient presents with   Essential hypertension, benign    6 months   HPI:    [name] [name]  is a 85 y.o. African-American male with history of paroxysmal atrial flutter status post ablation but had recurrence of atrial flutter and hence has been on anticoagulation, sick sinus syndrome S/P pacemaker implantation in 2013, also has had atrial fibrillation on pacemaker interrogation.  Has prostate cancer and chemotherapy was discontinued due to recurrent episodes of severe hypotension and syncope.   He is tolerating Eliquis without bleeding complications.  Feels well and essentially asymptomatic.  He is here for management of hypertension, atrial fibrillation and renal issues. Denies chest pain or palpitations.  States that his PSA level has increased and he has not been scheduled for further scans of his body to evaluate for recurrence of prostate cancer.  Past Medical History:  Diagnosis Date   AKI (acute kidney injury) ([place]) 06/08/2018   Atrial flutter ([place])    Carcinoma of prostate ([place]) 12/30/2011   Treated with seed/radiation therapy.    Colostomy in place [facility])    Difficulty sleeping    lives in shelter currently   Diverticulitis    Dyslipidemia 12/30/2011   Encounter for care of pacemaker 06/17/2018   Frequency of urination    Heart murmur    Hypertension 04/30/11   Cardioversion 06/16/11   Near syncope 06/07/2018   Nocturia    Orthostatic hypotension 06/08/2018   Pacemaker 7/13    sick sinus syndrome/St Jude pacemaker   Paroxysmal atrial flutter ([place]) 06/16/2011   Prostate cancer ([place])    Sick sinus syndrome ([place]) 11/28/2011   Syncope and collapse 11/20/2011   Atrial and ventricular standstill > 3 seconds.    Thyroid disease    had "overactive thyroid in 1997" - no known problem since   Past Surgical History:  Procedure Laterality Date    ATRIAL FLUTTER ABLATION N/A 11/21/2011   Procedure: ATRIAL FLUTTER ABLATION;  Surgeon: Thompson Grayer, MD;  Location: [facility] CATH LAB;  Service: Cardiovascular;  Laterality: N/A;   CARDIAC ELECTROPHYSIOLOGY STUDY AND ABLATION  7/13   CARDIOVERSION  06/16/2011   Procedure: CARDIOVERSION;  Surgeon: Laverda Page, MD;  Location: [place];  Service: Cardiovascular;  Laterality: N/A;   COLON SURGERY  05/13/2013   COLOSTOMY CLOSURE N/A 11/25/2013   Procedure: COLOSTOMY CLOSURE;  Surgeon: Earnstine Regal, MD;  [facility];  Service: General;  Laterality: N/A;   COLOSTOMY CLOSURE  11/25/2013   PACEMAKER INSERTION  11/28/11   SJM Accent DR RF implanted by Dr Rayann Heman   PARTIAL COLECTOMY N/A 05/13/2013   Procedure: sigmoid COLECTOMY  colostomy ;  Surgeon: Earnstine Regal, MD;  Location: [facility];  Service: General;  Laterality: N/A;   PERMANENT PACEMAKER INSERTION N/A 11/28/2011   Procedure: PERMANENT PACEMAKER INSERTION;  Surgeon: Thompson Grayer, MD;  Location: [facility] CATH LAB;  Service: Cardiovascular;  Laterality: N/A;   RADIOACTIVE SEED IMPLANT     TONSILLECTOMY     Social History   Tobacco Use   Smoking status: Former    Packs/day: 0.50    Years: 50.00    Pack years: 25.00    Types: Cigarettes    Quit date: 07/20/2009    Years since quitting: 11.5   Smokeless tobacco: Never  Substance Use Topics   Alcohol use: No   Marital  Status: Single  ROS  Review of Systems  Cardiovascular:  Negative for chest pain, dyspnea on exertion and leg swelling.  Gastrointestinal:  Negative for melena.  Objective  Blood pressure 116/67, pulse 79, temperature (!) 97.1 F (36.2 C), temperature source Temporal, resp. rate 17, height '5\' 7"'  (1.702 m), weight 161 lb 9.6 oz (73.3 kg), SpO2 98 %.  Vitals with BMI 01/16/2021 07/16/2020 01/02/2020  Height '5\' 7"'  '5\' 7"'  '5\' 7"'   Weight 161 lbs 10 oz 173 lbs 155 lbs 13 oz  BMI 25.3 17.51 02.5  Systolic 852 778 242  Diastolic 67 61 61  Pulse 79 72 67     Physical Exam Constitutional:       General: He is not in acute distress.    Appearance: He is well-developed.  Neck:     Vascular: No carotid bruit or JVD.  Cardiovascular:     Rate and Rhythm: Normal rate and regular rhythm.     Pulses: Normal pulses and intact distal pulses.     Heart sounds: Normal heart sounds. No murmur heard.   No gallop.  Pulmonary:     Effort: Pulmonary effort is normal.     Breath sounds: Normal breath sounds.  Abdominal:     General: Bowel sounds are normal.     Palpations: Abdomen is soft.  Musculoskeletal:     Right lower leg: No edema.     Left lower leg: No edema.  Skin:    General: Skin is warm.   Laboratory examination:   No results for input(s): NA, K, CL, CO2, GLUCOSE, BUN, CREATININE, CALCIUM, GFRNONAA, GFRAA in the last 8760 hours.  CrCl cannot be calculated (Patient's most recent lab result is older than the maximum 21 days allowed.).  CMP Latest Ref Rng & Units 12/29/2019 07/29/2019 04/27/2019  Glucose 65 - 99 mg/dL 120(H) 121(H) 106(H)  BUN 8 - 27 mg/dL 15 20 56(H)  Creatinine 0.76 - 1.27 mg/dL 1.55(H) 1.70(H) 1.60(H)  Sodium 134 - 144 mmol/L 141 143 137  Potassium 3.5 - 5.2 mmol/L 4.3 3.9 3.6  Chloride 96 - 106 mmol/L 103 103 109  CO2 20 - 29 mmol/L 23 19(L) 18(L)  Calcium 8.6 - 10.2 mg/dL 9.4 9.5 8.2(L)  Total Protein 6.5 - 8.1 g/dL - - -  Total Bilirubin 0.3 - 1.2 mg/dL - - -  Alkaline Phos 38 - 126 U/L - - -  AST 15 - 41 U/L - - -  ALT 0 - 44 U/L - - -   CBC Latest Ref Rng & Units 12/29/2019 04/25/2019 04/24/2019  WBC 3.4 - 10.8 x10E3/uL 5.1 10.2 9.4  Hemoglobin 13.0 - 17.7 g/dL 13.3 14.5 19.1(H)  Hematocrit 37.5 - 51.0 % 39.9 44.6 56.0(H)  Platelets 150 - 450 x10E3/uL 254 299 334   Lipid Panel  No results found for: CHOL, TRIG, HDL, CHOLHDL, VLDL, LDLCALC, LDLDIRECT HEMOGLOBIN A1C Lab Results  Component Value Date   HGBA1C 6.5 (H) 03/01/2019   MPG 139.85 03/01/2019   TSH No results for input(s): TSH in the last 8760 hours.  Medications and  allergies   Allergies  Allergen Reactions   Xarelto [Rivaroxaban] Other (See Comments)    "I didn't like the way it made me feel" (patient didn't elaborate)     Outpatient Medications Prior to Visit  Medication Sig Dispense Refill   atorvastatin (LIPITOR) 20 MG tablet Take 1 tablet (20 mg total) by mouth daily. 90 tablet 3   diltiazem (CARDIZEM CD) 180 MG 24 hr  capsule TAKE 1 CAPSULE(180 MG) BY MOUTH DAILY 90 capsule 3   ELIQUIS 2.5 MG TABS tablet TAKE 1 TABLET TWICE DAILY 180 tablet 1   hydrALAZINE (APRESOLINE) 50 MG tablet Take 1 tablet (50 mg total) by mouth 3 (three) times daily. 90 tablet 3   metoprolol tartrate (LOPRESSOR) 50 MG tablet Take 1 tablet (50 mg total) by mouth 2 (two) times daily. 180 tablet 3   Multiple Vitamin (MULTIVITAMIN WITH MINERALS) TABS tablet Take 1 tablet by mouth daily. 90 tablet 0   olmesartan-hydrochlorothiazide (BENICAR HCT) 40-25 MG tablet Take 1 tablet by mouth daily. 30 tablet 3   tamsulosin (FLOMAX) 0.4 MG CAPS capsule TAKE 1 CAPSULE AT BEDTIME 60 capsule 2   No facility-administered medications prior to visit.    Radiology:  No results found.  Cardiac Studies:   Echocardiogram 06/08/2018: 1. The left ventricle has hyperdynamic systolic function of >16%. The cavity size is normal. There is no increas ed left ventricular wall thickness. Echo evidence of normal diastolic filling patterns. Normal left ventricular filling pressures. 2. The aortic valve is tricuspid in structure. There is mild thickening and mild calcification of the aortic valve.   Dual-chamber pacemaker transmission 12/13/2020: Longevity consign 3 months. AP 81%, VP 23%. There were 22 AMS episodes, mode switch <1%. AT/AF burden <1%. EGM = brief AT.Marland Kitchen   Scheduled  In office pacemaker check 07/16/20  Single (S)/Dual (D)/BV: D. Presenting APVS. Pacemaker dependant:  Yes. Underlying Silent atrium with no escape. AP 90%, VP 13%. AMS Episodes 1. 07/08/2020. PAC no significant arrhythmias.     HVR 0. Longevity 10 months. Magnet rate: >85%. Lead measurements: Stable. Histogram: Low (L)/normal (N)/high (H)  Normal. Patient activity Normal.   Observations: Normal pacemaker function. Changes: None. Pacemaker EOL alert siren demonstrated to patient.   EKG:  EKG 01/16/2021: Atrial  Rhythm at the rate of 60 bpm, normal axis, incomplete right bundle branch block.  No evidence of ischemia.   EKG 07/16/2020: Atrially paced rhythm at rate of 68 bpm, normal axis, no evidence of ischemia.  PACs (2).    No significant change from EKG 09/08/2019:   04/24/19: Ectopic atrial tachycardia with wide complex, no further rhythm analysis attempted  Assessment     ICD-10-CM   1. Paroxysmal atrial flutter (HCC)  I48.92 EKG 12-Lead    CBC    CMP14+EGFR    2. Pacemaker-St.Jude Accent DR-RF dual chamber pacemaker   Z95.0     3. Sick sinus syndrome (HCC)  I49.5     4. Essential hypertension, benign  I10     5. Hyperglycemia  R73.9 TSH    Hgb A1c w/o eAG    6. Hypercholesteremia  E78.00 Lipid Panel With LDL/HDL Ratio      No orders of the defined types were placed in this encounter.   There are no discontinued medications.   Recommendations:   [name] [name]  is a 85 y.o. African-American male with history of paroxysmal atrial flutter status post ablation but had recurrence of atrial flutter and hence has been on anticoagulation, sick sinus syndrome S/P pacemaker implantation in 2013, also has had atrial fibrillation on pacemaker interrogation.  Past medical history significant for stage III chronic kidney disease, prostate cancer and chemotherapy was discontinued due to recurrent episodes of severe hypotension and syncope.   He is presently tolerating all his medications well and has not had any issues controlling his blood pressure.  He remains asymptomatic.  He is also tolerating anticoagulation without bleeding diathesis and  is maintaining sinus rhythm.  Pacemaker data was reviewed with  patient and reassured.  He will need close monitoring of his pacemaker as it is coming towards end-of-life.  I will schedule him for in-house check in 2 to 3 days and again in 2 months.  I ordered labs to evaluate his A1c, he has hyperglycemia and borderline range for diabetes mellitus, stage III chronic kidney disease, needs CBC as well.  I will send a copy to his PCP.  I will see him back in 2 months.    Adrian Prows, MD, [facility] 01/16/2021, 2:37 PM Office: [phone]

## 2021-01-18 ENCOUNTER — Encounter (HOSPITAL_COMMUNITY)
Admission: RE | Admit: 2021-01-18 | Discharge: 2021-01-18 | Disposition: A | Discharge status: Home / Self Care | Payer: Medicare HMO | Source: Ambulatory Visit | Attending: Urology | Admitting: Urology

## 2021-01-18 ENCOUNTER — Other Ambulatory Visit: Payer: Self-pay

## 2021-01-18 DIAGNOSIS — R9721 Rising PSA following treatment for malignant neoplasm of prostate: Secondary | ICD-10-CM | POA: Diagnosis not present

## 2021-01-18 LAB — CBC
Hematocrit: 38.6 % (ref 37.5–51.0)
Hemoglobin: 12.7 g/dL — ABNORMAL LOW (ref 13.0–17.7)
MCH: 30 pg (ref 26.6–33.0)
MCHC: 32.9 g/dL (ref 31.5–35.7)
MCV: 91 fL (ref 79–97)
Platelets: 217 10*3/uL (ref 150–450)
RBC: 4.23 x10E6/uL (ref 4.14–5.80)
RDW: 13.7 % (ref 11.6–15.4)
WBC: 5.6 10*3/uL (ref 3.4–10.8)

## 2021-01-18 LAB — CMP14+EGFR
ALT: 10 IU/L (ref 0–44)
AST: 17 IU/L (ref 0–40)
Albumin/Globulin Ratio: 1.7 (ref 1.2–2.2)
Albumin: 4.2 g/dL (ref 3.6–4.6)
Alkaline Phosphatase: 85 IU/L (ref 44–121)
BUN/Creatinine Ratio: 10 (ref 10–24)
BUN: 15 mg/dL (ref 8–27)
Bilirubin Total: 0.7 mg/dL (ref 0.0–1.2)
CO2: 23 mmol/L (ref 20–29)
Calcium: 9.2 mg/dL (ref 8.6–10.2)
Chloride: 104 mmol/L (ref 96–106)
Creatinine, Ser: 1.43 mg/dL — ABNORMAL HIGH (ref 0.76–1.27)
Globulin, Total: 2.5 g/dL (ref 1.5–4.5)
Glucose: 125 mg/dL — ABNORMAL HIGH (ref 65–99)
Potassium: 3.8 mmol/L (ref 3.5–5.2)
Sodium: 144 mmol/L (ref 134–144)
Total Protein: 6.7 g/dL (ref 6.0–8.5)
eGFR: 49 mL/min/{1.73_m2} — ABNORMAL LOW (ref >59)

## 2021-01-18 LAB — LIPID PANEL WITH LDL/HDL RATIO
Cholesterol, Total: 134 mg/dL (ref 100–199)
HDL: 67 mg/dL (ref >39)
LDL Chol Calc (NIH): 48 mg/dL (ref 0–99)
LDL/HDL Ratio: 0.7 ratio (ref 0.0–3.6)
Triglycerides: 102 mg/dL (ref 0–149)
VLDL Cholesterol Cal: 19 mg/dL (ref 5–40)

## 2021-01-18 LAB — HGB A1C W/O EAG: Hgb A1c MFr Bld: 6.1 % — ABNORMAL HIGH (ref 4.8–5.6)

## 2021-01-18 LAB — TSH: TSH: 0.824 u[IU]/mL (ref 0.450–4.500)

## 2021-01-18 MED ORDER — TECHNETIUM TC 99M MEDRONATE IV KIT
21.9000 | PACK | Freq: Once | INTRAVENOUS | Status: AC | PRN
  Administered 2021-01-18: 21.9 via INTRAVENOUS

## 2021-01-18 NOTE — Progress Notes (Signed)
Labs including blood counts, lipids are normal and thyroid function test is normal.  Blood sugar is mildly elevated but not in diabetic range.  He does have chronic kidney disease which is mild and stable over the years.  Will forward a copy to PCP.

## 2021-01-18 NOTE — Progress Notes (Signed)
Called patient, NA, LMAM

## 2021-01-18 NOTE — Progress Notes (Signed)
Called patient, [name] [name], [name] [name]

## 2021-01-23 ENCOUNTER — Other Ambulatory Visit: Payer: Self-pay

## 2021-01-23 ENCOUNTER — Ambulatory Visit: Payer: Medicare HMO | Admitting: Cardiology

## 2021-01-23 ENCOUNTER — Encounter: Payer: Self-pay | Admitting: Cardiology

## 2021-01-23 DIAGNOSIS — Z950 Presence of cardiac pacemaker: Secondary | ICD-10-CM

## 2021-01-23 DIAGNOSIS — Z45018 Encounter for adjustment and management of other part of cardiac pacemaker: Secondary | ICD-10-CM

## 2021-01-23 DIAGNOSIS — I495 Sick sinus syndrome: Secondary | ICD-10-CM

## 2021-01-23 DIAGNOSIS — Z45010 Encounter for checking and testing of cardiac pacemaker pulse generator [battery]: Secondary | ICD-10-CM

## 2021-01-23 NOTE — Progress Notes (Signed)
Chief Complaint  Patient presents with   Pacemaker Check   Scheduled  In office pacemaker check 01/23/21  Single (S)/Dual (D)/BV: D. Presenting APVS @ 60/min. Pacemaker dependant:  Yes. Underlying  Asystole - Atrial. AP 99%, VP 14%. AMS Episodes 8., longest 10 Sec. Retrograde AP. 1 PMT brief.   HVR No.  Longevity < 3 months. ERI 2.6 V (ERI 2.59). Magnet rate: >85%. Lead measurements: Stable.  Histogram: Low (L)/normal (N)/high (H)  Normal. Patient activity Normal.  Observations: Normal pacemaker function. Changes: No changes.  Will refer to EP for generator change-out.     ICD-10-CM   1. Encounter for care of pacemaker  Z45.018     2. Pacemaker-St.Jude Accent DR-RF dual chamber pacemaker   Z95.0 Ambulatory referral to Cardiac Electrophysiology    3. Sick sinus syndrome (HCC)  I49.5 Ambulatory referral to Cardiac Electrophysiology    4. Pacemaker at end of battery life  Z45.010 Ambulatory referral to Cardiac Electrophysiology       Adrian Prows, MD, [facility] 01/23/2021, 11:10 PM Office: [phone] Fax: [phone] Pager: [phone]

## 2021-01-24 ENCOUNTER — Telehealth: Payer: Self-pay | Admitting: Cardiology

## 2021-01-24 NOTE — Telephone Encounter (Signed)
   Scheduled  In office pacemaker check 01/23/21  Single (S)/Dual (D)/BV: D. Presenting APVS @ 60/min. Pacemaker dependant:  Yes. Underlying  Asystole - Atrial. AP 99%, VP 14%. AMS Episodes 8., longest 10 Sec. Retrograde AP. 1 PMT brief.   HVR No.  Longevity < 3 months. ERI 2.6 V (ERI 2.59). Magnet rate: >85%. Lead measurements: Stable.   Histogram: Low (L)/normal (N)/high (H)  Normal. Patient activity Normal.  Observations: Normal pacemaker function. Changes: No changes.   Will refer to EP for generator change-out.   I have sent in consult request. Patient is pacemaker dependant.   JG

## 2021-01-29 NOTE — Telephone Encounter (Signed)
Looks like he is scheduled to see Dr Lovena Le 03/05/21.

## 2021-01-29 NOTE — Telephone Encounter (Signed)
Sorry.. You had placed the pacemaker previously and I wanted to send him back to you. Again sorry.

## 2021-01-30 ENCOUNTER — Other Ambulatory Visit: Payer: Self-pay | Admitting: Cardiology

## 2021-02-08 ENCOUNTER — Telehealth: Payer: Self-pay | Admitting: Cardiology

## 2021-02-14 NOTE — Telephone Encounter (Signed)
Ulice Dash, I tried to see him this week but he could not come in on Tuesday. I will see him ASAP and get his device changed out. GT

## 2021-02-19 ENCOUNTER — Ambulatory Visit: Payer: Medicare HMO | Admitting: Internal Medicine

## 2021-03-05 ENCOUNTER — Ambulatory Visit: Payer: Medicare HMO | Admitting: Internal Medicine

## 2021-03-13 ENCOUNTER — Other Ambulatory Visit: Payer: Self-pay

## 2021-03-13 ENCOUNTER — Ambulatory Visit (INDEPENDENT_AMBULATORY_CARE_PROVIDER_SITE_OTHER): Payer: Medicare HMO | Admitting: Internal Medicine

## 2021-03-13 VITALS — BP 132/66 | HR 55 | Ht 67.0 in | Wt 172.0 lb

## 2021-03-13 DIAGNOSIS — I495 Sick sinus syndrome: Secondary | ICD-10-CM | POA: Diagnosis not present

## 2021-03-13 DIAGNOSIS — Z950 Presence of cardiac pacemaker: Secondary | ICD-10-CM

## 2021-03-13 LAB — CBC WITH DIFFERENTIAL/PLATELET
Basophils Absolute: 0 10*3/uL (ref 0.0–0.2)
Basos: 0 %
EOS (ABSOLUTE): 0.1 10*3/uL (ref 0.0–0.4)
Eos: 1 %
Hematocrit: 36.8 % — ABNORMAL LOW (ref 37.5–51.0)
Hemoglobin: 12.3 g/dL — ABNORMAL LOW (ref 13.0–17.7)
Lymphocytes Absolute: 3.3 10*3/uL — ABNORMAL HIGH (ref 0.7–3.1)
Lymphs: 34 %
MCH: 30.8 pg (ref 26.6–33.0)
MCHC: 33.4 g/dL (ref 31.5–35.7)
MCV: 92 fL (ref 79–97)
Monocytes Absolute: 1 10*3/uL — ABNORMAL HIGH (ref 0.1–0.9)
Monocytes: 10 %
Neutrophils Absolute: 5.2 10*3/uL (ref 1.4–7.0)
Neutrophils: 55 %
Platelets: 207 10*3/uL (ref 150–450)
RBC: 4 x10E6/uL — ABNORMAL LOW (ref 4.14–5.80)
RDW: 14.6 % (ref 11.6–15.4)
WBC: 9.7 10*3/uL (ref 3.4–10.8)

## 2021-03-13 LAB — BASIC METABOLIC PANEL
BUN/Creatinine Ratio: 17 (ref 10–24)
BUN: 22 mg/dL (ref 8–27)
CO2: 24 mmol/L (ref 20–29)
Calcium: 8.3 mg/dL — ABNORMAL LOW (ref 8.6–10.2)
Chloride: 110 mmol/L — ABNORMAL HIGH (ref 96–106)
Creatinine, Ser: 1.26 mg/dL (ref 0.76–1.27)
Glucose: 95 mg/dL (ref 70–99)
Potassium: 4.2 mmol/L (ref 3.5–5.2)
Sodium: 142 mmol/L (ref 134–144)
eGFR: 57 mL/min/{1.73_m2} — ABNORMAL LOW (ref >59)

## 2021-03-13 NOTE — Patient Instructions (Addendum)
Medication Instructions:  Your physician recommends that you continue on your current medications as directed. Please refer to the Current Medication list given to you today.  STOP Eliquis after your March 15, 2021 PM dose  Labwork: You will get lab work today;  CBC and BMP  Testing/Procedures: None ordered.  Follow-Up:  SEE INSTRUCTION LETTER   Any Other Special Instructions Will Be Listed Below (If Applicable).  If you need a refill on your cardiac medications before your next appointment, please call your pharmacy.   Pacemaker Battery Change A pacemaker battery usually lasts 5-15 years (6-7 years on average). A few times a year, you may be asked to visit your health care provider to have a full evaluation of your pacemaker. When the battery is low, your pacemaker will be completely replaced. Most often, this procedure is simpler than the first surgery because the wires (leads) that connect the pacemaker to the heart are already in place. There are many things that affect how long a pacemaker battery will last, including: The age of the pacemaker. The number of leads you have(1, 2, or 3). The use or workload of the pacemaker. If the pacemaker is helping the heart more often, the battery will not last as long. Power (voltage) settings. Tell a health care provider about: Any allergies you have. All medicines you are taking, including vitamins, herbs, eye drops, creams, and over-the-counter medicines. Any problems you or family members have had with anesthetic medicines. Any blood disorders you have. Any surgeries you have had, especially any surgeries you have had since your last pacemaker was placed. Any medical conditions you have. Whether you are pregnant or may be pregnant. What are the risks? Generally, this is a safe procedure. However, problems may occur, including: Bleeding. Infection. Nerve damage. Allergic reaction to medicines. Damage to the leads that go to the  heart. What happens before the procedure? Staying hydrated Follow instructions from your health care provider about hydration, which may include: Up to 2 hours before the procedure - you may continue to drink clear liquids, such as water, clear fruit juice, black coffee, and plain tea. Eating and drinking restrictions Follow instructions from your health care provider about eating and drinking restrictions, which may include: 8 hours before the procedure - stop eating heavy meals or foods, such as meat, fried foods, or fatty foods. 6 hours before the procedure - stop eating light meals or foods, such as toast or cereal. 6 hours before the procedure - stop drinking milk or drinks that contain milk. 2 hours before the procedure - stop drinking clear liquids. Medicines Ask your health care provider about: Changing or stopping your regular medicines. This is especially important if you are taking diabetes medicines or blood thinners. Taking medicines such as aspirin and ibuprofen. These medicines can thin your blood. Do not take these medicines unless your health care provider tells you to take them. Taking over-the-counter medicines, vitamins, herbs, and supplements. General instructions Ask your health care provider what steps will be taken to help prevent infection. These may include: Removing hair at the surgery site. Washing skin with a germ-killing soap. Receiving antibiotic medicine. Plan to have someone take you home from the hospital or clinic. If you will be going home right after the procedure, plan to have someone with you for 24 hours. What happens during the procedure? An IV will be inserted into one of your veins. You will be given one or more of the following: A medicine to help  you relax (sedative). A medicine to numb the area where the pacemaker is located (local anesthetic). Your health care provider will make an incision to reopen the pocket holding the pacemaker. The old  pacemaker will be disconnected from the leads. The leads will be tested. If needed, the leads will be replaced. If the leads are functioning properly, the new pacemaker will be connected to the existing leads. A heart monitor and a pacemaker programmer will be used to make sure that the newly implanted pacemaker is working properly. The incision site will be closed with stitches (sutures), adhesive strips, or skin glue. A bandage (dressing) will be placed over the pacemaker site. The procedure may vary among health care providers and hospitals. What happens after the procedure? Your blood pressure, heart rate, breathing rate, and blood oxygen level will be monitored until you leave the hospital or clinic. You may be given antibiotics. Your health care provider will tell you when your pacemaker will need to be tested again, or when to return to the office for removal of the dressing and sutures. If you were given a sedative during the procedure, it can affect you for several hours. Do not drive or operate machinery until your health care provider says that it is safe. You will be given a pacemaker identification card. This card lists the implant date, device model, and manufacturer of your pacemaker. Summary A pacemaker battery usually lasts 5-15 years (6-7 years on average). When the battery is low, your pacemaker will need to be replaced. Most often, this procedure is simpler than the first surgery because the wires (leads) that connect the pacemaker to the heart are already in place. Risks of this procedure include bleeding, infection, and allergic reactions to medicines. This information is not intended to replace advice given to you by your health care provider. Make sure you discuss any questions you have with your health care provider. Document Revised: 03/24/2019 Document Reviewed: 03/24/2019 Elsevier Patient Education  2022 [employer].

## 2021-03-13 NOTE — H&P (View-Only) (Signed)
HPI [name] [name] is referred by Dr. Einar Gip for evaluation of sinus node dysfunction s/p PPM insertion with his device at EOL. He has been stable since his PPM insertion. He has sinus node dysfunction.  Allergies  Allergen Reactions   Xarelto [Rivaroxaban] Other (See Comments)    "I didn't like the way it made me feel" (patient didn't elaborate)     Current Outpatient Medications  Medication Sig Dispense Refill   atorvastatin (LIPITOR) 20 MG tablet Take 1 tablet (20 mg total) by mouth daily. 90 tablet 3   diltiazem (CARDIZEM CD) 180 MG 24 hr capsule TAKE 1 CAPSULE(180 MG) BY MOUTH DAILY 90 capsule 3   ELIQUIS 2.5 MG TABS tablet TAKE 1 TABLET TWICE DAILY 180 tablet 1   metoprolol tartrate (LOPRESSOR) 50 MG tablet Take 1 tablet (50 mg total) by mouth 2 (two) times daily. 180 tablet 3   olmesartan-hydrochlorothiazide (BENICAR HCT) 40-25 MG tablet Take 1 tablet by mouth daily. (Patient not taking: Reported on 03/13/2021) 30 tablet 3   tamsulosin (FLOMAX) 0.4 MG CAPS capsule TAKE 1 CAPSULE AT BEDTIME 90 capsule 0   abiraterone acetate (ZYTIGA) 250 MG tablet Take 1,000 mg by mouth daily.     hydrALAZINE (APRESOLINE) 50 MG tablet Take 1 tablet (50 mg total) by mouth 3 (three) times daily. (Patient not taking: Reported on 03/13/2021) 90 tablet 3   predniSONE (DELTASONE) 5 MG tablet Take 5 mg by mouth daily.     No current facility-administered medications for this visit.     Past Medical History:  Diagnosis Date   AKI (acute kidney injury) ([place]) 06/08/2018   Atrial flutter ([place])    Carcinoma of prostate ([place]) 12/30/2011   Treated with seed/radiation therapy.    Colostomy in place [facility])    Difficulty sleeping    lives in shelter currently   Diverticulitis    Dyslipidemia 12/30/2011   Encounter for care of pacemaker 06/17/2018   Frequency of urination    Heart murmur    Hypertension 04/30/11   Cardioversion 06/16/11   Near syncope 06/07/2018   Nocturia    Orthostatic hypotension 06/08/2018    Pacemaker 7/13    sick sinus syndrome/St Jude pacemaker   Paroxysmal atrial flutter ([place]) 06/16/2011   Prostate cancer ([place])    Sick sinus syndrome ([place]) 11/28/2011   Syncope and collapse 11/20/2011   Atrial and ventricular standstill > 3 seconds.    Thyroid disease    had "overactive thyroid in 1997" - no known problem since    ROS:   All systems reviewed and negative except as noted in the HPI.   Past Surgical History:  Procedure Laterality Date   ATRIAL FLUTTER ABLATION N/A 11/21/2011   Procedure: ATRIAL FLUTTER ABLATION;  Surgeon: Thompson Grayer, MD;  Location: [facility] CATH LAB;  Service: Cardiovascular;  Laterality: N/A;   CARDIAC ELECTROPHYSIOLOGY STUDY AND ABLATION  7/13   CARDIOVERSION  06/16/2011   Procedure: CARDIOVERSION;  Surgeon: Laverda Page, MD;  Location: [facility];  Service: Cardiovascular;  Laterality: N/A;   COLON SURGERY  05/13/2013   COLOSTOMY CLOSURE N/A 11/25/2013   Procedure: COLOSTOMY CLOSURE;  Surgeon: Earnstine Regal, MD;  Location: [facility] ORS;  Service: General;  Laterality: N/A;   COLOSTOMY CLOSURE  11/25/2013   PACEMAKER INSERTION  11/28/11   SJM Accent DR RF implanted by Dr Rayann Heman   PARTIAL COLECTOMY N/A 05/13/2013   Procedure: sigmoid COLECTOMY  colostomy ;  Surgeon: Earnstine Regal, MD;  Location: [facility]  ORS;  Service: General;  Laterality: N/A;   PERMANENT PACEMAKER INSERTION N/A 11/28/2011   Procedure: PERMANENT PACEMAKER INSERTION;  Surgeon: Thompson Grayer, MD;  [facility];  Service: Cardiovascular;  Laterality: N/A;   RADIOACTIVE SEED IMPLANT     TONSILLECTOMY       Family History  Problem Relation Age of Onset   Leukemia Mother    Dementia Father      Social History   Socioeconomic History   Marital status: Single    Spouse name: Not on file   Number of children: 6   Years of education: Not on file   Highest education level: Not on file  Occupational History   Not on file  Tobacco Use   Smoking status: Former    Packs/day: 0.50    Years:  50.00    Pack years: 25.00    Types: Cigarettes    Quit date: 07/20/2009    Years since quitting: 11.6   Smokeless tobacco: Never  Vaping Use   Vaping Use: Never used  Substance and Sexual Activity   Alcohol use: No   Drug use: No   Sexual activity: Never  Other Topics Concern   Not on file  Social History Narrative   Not on file   Social Determinants of Health   Financial Resource Strain: Not on file  Food Insecurity: Not on file  Transportation Needs: Not on file  Physical Activity: Not on file  Stress: Not on file  Social Connections: Not on file  Intimate Partner Violence: Not on file     BP 132/66   Pulse (!) 55   Ht 5\' 7"  (1.702 m)   Wt 172 lb (78 kg)   SpO2 99%   BMI 26.94 kg/m   Physical Exam:  Well appearing NAD HEENT: Unremarkable Neck:  No JVD, no thyromegally Lymphatics:  No adenopathy Back:  No CVA tenderness Lungs:  Clear with no wheezes HEART:  Regular rate rhythm, no murmurs, no rubs, no clicks Abd:  soft, positive bowel sounds, no organomegally, no rebound, no guarding Ext:  2 plus pulses, no edema, no cyanosis, no clubbing Skin:  No rashes no nodules Neuro:  CN II through XII intact, motor grossly intact  EKG - NSR with atrial pacing  DEVICE  Normal device function.  See PaceArt for details.   Assess/Plan:  PAF - he is maintaining NSR. No change in meds. PPM - his St. Jude DDD PM is working normally. He is at EOL. HTN - his bp is controlled.   [name] Overlie [name],MD

## 2021-03-13 NOTE — Progress Notes (Signed)
HPI [name] [name] is referred by Dr. Einar Gip for evaluation of sinus node dysfunction s/p PPM insertion with his device at EOL. He has been stable since his PPM insertion. He has sinus node dysfunction.  Allergies  Allergen Reactions   Xarelto [Rivaroxaban] Other (See Comments)    "I didn't like the way it made me feel" (patient didn't elaborate)     Current Outpatient Medications  Medication Sig Dispense Refill   atorvastatin (LIPITOR) 20 MG tablet Take 1 tablet (20 mg total) by mouth daily. 90 tablet 3   diltiazem (CARDIZEM CD) 180 MG 24 hr capsule TAKE 1 CAPSULE(180 MG) BY MOUTH DAILY 90 capsule 3   ELIQUIS 2.5 MG TABS tablet TAKE 1 TABLET TWICE DAILY 180 tablet 1   metoprolol tartrate (LOPRESSOR) 50 MG tablet Take 1 tablet (50 mg total) by mouth 2 (two) times daily. 180 tablet 3   olmesartan-hydrochlorothiazide (BENICAR HCT) 40-25 MG tablet Take 1 tablet by mouth daily. (Patient not taking: Reported on 03/13/2021) 30 tablet 3   tamsulosin (FLOMAX) 0.4 MG CAPS capsule TAKE 1 CAPSULE AT BEDTIME 90 capsule 0   abiraterone acetate (ZYTIGA) 250 MG tablet Take 1,000 mg by mouth daily.     hydrALAZINE (APRESOLINE) 50 MG tablet Take 1 tablet (50 mg total) by mouth 3 (three) times daily. (Patient not taking: Reported on 03/13/2021) 90 tablet 3   predniSONE (DELTASONE) 5 MG tablet Take 5 mg by mouth daily.     No current facility-administered medications for this visit.     Past Medical History:  Diagnosis Date   AKI (acute kidney injury) ([place]) 06/08/2018   Atrial flutter ([place])    Carcinoma of prostate ([place]) 12/30/2011   Treated with seed/radiation therapy.    Colostomy in place [facility])    Difficulty sleeping    lives in shelter currently   Diverticulitis    Dyslipidemia 12/30/2011   Encounter for care of pacemaker 06/17/2018   Frequency of urination    Heart murmur    Hypertension 04/30/11   Cardioversion 06/16/11   Near syncope 06/07/2018   Nocturia    Orthostatic hypotension 06/08/2018    Pacemaker 7/13    sick sinus syndrome/St Jude pacemaker   Paroxysmal atrial flutter ([place]) 06/16/2011   Prostate cancer ([place])    Sick sinus syndrome ([place]) 11/28/2011   Syncope and collapse 11/20/2011   Atrial and ventricular standstill > 3 seconds.    Thyroid disease    had "overactive thyroid in 1997" - no known problem since    ROS:   All systems reviewed and negative except as noted in the HPI.   Past Surgical History:  Procedure Laterality Date   ATRIAL FLUTTER ABLATION N/A 11/21/2011   Procedure: ATRIAL FLUTTER ABLATION;  Surgeon: Thompson Grayer, MD;  Location: [facility] CATH LAB;  Service: Cardiovascular;  Laterality: N/A;   CARDIAC ELECTROPHYSIOLOGY STUDY AND ABLATION  7/13   CARDIOVERSION  06/16/2011   Procedure: CARDIOVERSION;  Surgeon: Laverda Page, MD;  Location: [place];  Service: Cardiovascular;  Laterality: N/A;   COLON SURGERY  05/13/2013   COLOSTOMY CLOSURE N/A 11/25/2013   Procedure: COLOSTOMY CLOSURE;  Surgeon: Earnstine Regal, MD;  Location: [facility] ORS;  Service: General;  Laterality: N/A;   COLOSTOMY CLOSURE  11/25/2013   PACEMAKER INSERTION  11/28/11   SJM Accent DR RF implanted by Dr Rayann Heman   PARTIAL COLECTOMY N/A 05/13/2013   Procedure: sigmoid COLECTOMY  colostomy ;  Surgeon: Earnstine Regal, MD;  Location: [facility]  ORS;  Service: General;  Laterality: N/A;   PERMANENT PACEMAKER INSERTION N/A 11/28/2011   Procedure: PERMANENT PACEMAKER INSERTION;  Surgeon: Thompson Grayer, MD;  Location: [facility] CATH LAB;  Service: Cardiovascular;  Laterality: N/A;   RADIOACTIVE SEED IMPLANT     TONSILLECTOMY       Family History  Problem Relation Age of Onset   Leukemia Mother    Dementia Father      Social History   Socioeconomic History   Marital status: Single    Spouse name: Not on file   Number of children: 6   Years of education: Not on file   Highest education level: Not on file  Occupational History   Not on file  Tobacco Use   Smoking status: Former    Packs/day: 0.50    Years:  50.00    Pack years: 25.00    Types: Cigarettes    Quit date: 07/20/2009    Years since quitting: 11.6   Smokeless tobacco: Never  Vaping Use   Vaping Use: Never used  Substance and Sexual Activity   Alcohol use: No   Drug use: No   Sexual activity: Never  Other Topics Concern   Not on file  Social History Narrative   Not on file   Social Determinants of Health   Financial Resource Strain: Not on file  Food Insecurity: Not on file  Transportation Needs: Not on file  Physical Activity: Not on file  Stress: Not on file  Social Connections: Not on file  Intimate Partner Violence: Not on file     BP 132/66   Pulse (!) 55   Ht 5\' 7"  (1.702 m)   Wt 172 lb (78 kg)   SpO2 99%   BMI 26.94 kg/m   Physical Exam:  Well appearing NAD HEENT: Unremarkable Neck:  No JVD, no thyromegally Lymphatics:  No adenopathy Back:  No CVA tenderness Lungs:  Clear with no wheezes HEART:  Regular rate rhythm, no murmurs, no rubs, no clicks Abd:  soft, positive bowel sounds, no organomegally, no rebound, no guarding Ext:  2 plus pulses, no edema, no cyanosis, no clubbing Skin:  No rashes no nodules Neuro:  CN II through XII intact, motor grossly intact  EKG - NSR with atrial pacing  DEVICE  Normal device function.  See PaceArt for details.   Assess/Plan:  PAF - he is maintaining NSR. No change in meds. PPM - his St. Jude DDD PM is working normally. He is at EOL. HTN - his bp is controlled.   [name] [name],MD

## 2021-03-15 NOTE — Pre-Procedure Instructions (Signed)
Instructed patient on the following items: Arrival time 0930 Nothing to eat or drink after midnight No meds AM of procedure Responsible person to drive you home and stay with you for 24 hrs Wash with special soap night before and morning of procedure If on anti-coagulant drug instructions Eliquis

## 2021-03-18 ENCOUNTER — Ambulatory Visit (HOSPITAL_COMMUNITY)
Admission: RE | Admit: 2021-03-18 | Discharge: 2021-03-18 | Disposition: A | Discharge status: Home / Self Care | Payer: Medicare HMO | Source: Ambulatory Visit | Attending: Internal Medicine | Admitting: Internal Medicine

## 2021-03-18 ENCOUNTER — Other Ambulatory Visit: Payer: Self-pay

## 2021-03-18 ENCOUNTER — Encounter (HOSPITAL_COMMUNITY)
Admission: RE | Disposition: A | Discharge status: Home / Self Care | Payer: Self-pay | Source: Ambulatory Visit | Attending: Internal Medicine

## 2021-03-18 ENCOUNTER — Encounter (HOSPITAL_COMMUNITY): Payer: Self-pay | Admitting: Internal Medicine

## 2021-03-18 DIAGNOSIS — I495 Sick sinus syndrome: Secondary | ICD-10-CM | POA: Diagnosis not present

## 2021-03-18 DIAGNOSIS — I10 Essential (primary) hypertension: Secondary | ICD-10-CM | POA: Diagnosis not present

## 2021-03-18 DIAGNOSIS — Z45010 Encounter for checking and testing of cardiac pacemaker pulse generator [battery]: Secondary | ICD-10-CM | POA: Insufficient documentation

## 2021-03-18 DIAGNOSIS — I480 Paroxysmal atrial fibrillation: Secondary | ICD-10-CM | POA: Diagnosis not present

## 2021-03-18 HISTORY — PX: PPM GENERATOR CHANGEOUT: EP1233

## 2021-03-18 SURGERY — PPM GENERATOR CHANGEOUT

## 2021-03-18 MED ORDER — SODIUM CHLORIDE 0.9 % IV SOLN: INTRAVENOUS | Status: DC

## 2021-03-18 MED ORDER — CHLORHEXIDINE GLUCONATE 4 % EX LIQD
4.0000 | Freq: Once | CUTANEOUS | Status: DC
Start: 2021-03-18 — End: 2021-03-18

## 2021-03-18 MED ORDER — ACETAMINOPHEN 325 MG PO TABS: 325.0000 mg | ORAL_TABLET | ORAL | Status: DC | PRN

## 2021-03-18 MED ORDER — CEFAZOLIN SODIUM-DEXTROSE 2-4 GM/100ML-% IV SOLN
2.0000 g | INTRAVENOUS | Status: AC
  Administered 2021-03-18: 2 g via INTRAVENOUS

## 2021-03-18 MED ORDER — CEFAZOLIN SODIUM-DEXTROSE 2-4 GM/100ML-% IV SOLN
INTRAVENOUS | Status: AC
  Filled 2021-03-18: qty 100

## 2021-03-18 MED ORDER — SODIUM CHLORIDE 0.9 % IV SOLN
INTRAVENOUS | Status: AC
  Filled 2021-03-18: qty 2

## 2021-03-18 MED ORDER — LIDOCAINE HCL (PF) 1 % IJ SOLN
INTRAMUSCULAR | Status: AC
  Filled 2021-03-18: qty 60

## 2021-03-18 MED ORDER — ONDANSETRON HCL 4 MG/2ML IJ SOLN: 4.0000 mg | Freq: Four times a day (QID) | INTRAMUSCULAR | Status: DC | PRN

## 2021-03-18 MED ORDER — SODIUM CHLORIDE 0.9 % IV SOLN
80.0000 mg | INTRAVENOUS | Status: AC
  Administered 2021-03-18: 80 mg

## 2021-03-18 MED ORDER — POVIDONE-IODINE 10 % EX SWAB
2.0000 "application " | Freq: Once | CUTANEOUS | Status: AC
  Administered 2021-03-18: 2 via TOPICAL

## 2021-03-18 MED ORDER — LIDOCAINE HCL (PF) 1 % IJ SOLN
INTRAMUSCULAR | Status: DC | PRN
  Administered 2021-03-18: 50 mL

## 2021-03-18 SURGICAL SUPPLY — 4 items
CABLE SURGICAL S-101-97-12 (CABLE) ×2 IMPLANT
PACEMAKER ASSURITY DR-RF (Pacemaker) ×2 IMPLANT
PAD PRO RADIOLUCENT 2001M-C (PAD) ×2 IMPLANT
TRAY PACEMAKER INSERTION (PACKS) ×2 IMPLANT

## 2021-03-18 NOTE — Interval H&P Note (Signed)
History and Physical Interval Note:  03/18/2021 9:51 AM  [name]  has presented today for surgery, with the diagnosis of ERI.  The various methods of treatment have been discussed with the patient and family. After consideration of risks, benefits and other options for treatment, the patient has consented to  Procedure(s): PPM GENERATOR CHANGEOUT (N/A) as a surgical intervention.  The patient's history has been reviewed, patient examined, no change in status, stable for surgery.  I have reviewed the patient's chart and labs.  Questions were answered to the patient's satisfaction.     [name] [name]

## 2021-03-18 NOTE — Interval H&P Note (Signed)
History and Physical Interval Note:  03/18/2021 9:59 AM  [name]  has presented today for surgery, with the diagnosis of ERI.  The various methods of treatment have been discussed with the patient and family. After consideration of risks, benefits and other options for treatment, the patient has consented to  Procedure(s): PPM GENERATOR CHANGEOUT (N/A) as a surgical intervention.  The patient's history has been reviewed, patient examined, no change in status, stable for surgery.  I have reviewed the patient's chart and labs.  Questions were answered to the patient's satisfaction.     [name] [name]

## 2021-03-19 ENCOUNTER — Other Ambulatory Visit: Payer: Self-pay | Admitting: Cardiology

## 2021-03-19 ENCOUNTER — Encounter (HOSPITAL_COMMUNITY): Payer: Self-pay | Admitting: Internal Medicine

## 2021-03-19 DIAGNOSIS — I10 Essential (primary) hypertension: Secondary | ICD-10-CM

## 2021-03-19 DIAGNOSIS — I4892 Unspecified atrial flutter: Secondary | ICD-10-CM

## 2021-03-21 ENCOUNTER — Telehealth: Payer: Self-pay | Admitting: Internal Medicine

## 2021-03-21 NOTE — Telephone Encounter (Signed)
  1. Has your device fired? no  2. Is you device beeping? no  3. Are you experiencing draining or swelling at device site? No- Had new device put in 03-18-21- wants to know when can he remove the bandage?  4. Are you calling to see if we received your device transmission? No  5. Have you passed out? no    Please route to [place]

## 2021-03-21 NOTE — Telephone Encounter (Signed)
Successful telephone encounter to patient to follow up on 03/18/21 gen change dressing removal. Instructed patient to take off clear outer dressing but to leave steri-strips under dressing intact. Patient verbalizes understanding. Appreciative of call back. He is provided device clinic contact for additional questions or concerns. States he is doing well. Confirmed wound check appointment for 04/03/21 at 10:40.

## 2021-04-03 ENCOUNTER — Other Ambulatory Visit: Payer: Self-pay

## 2021-04-03 ENCOUNTER — Ambulatory Visit (INDEPENDENT_AMBULATORY_CARE_PROVIDER_SITE_OTHER): Payer: Medicare HMO

## 2021-04-03 DIAGNOSIS — I495 Sick sinus syndrome: Secondary | ICD-10-CM | POA: Diagnosis not present

## 2021-04-03 LAB — CUP PACEART INCLINIC DEVICE CHECK
Battery Remaining Longevity: 122 mo
Battery Voltage: 3.1 V
Brady Statistic RA Percent Paced: 74 %
Brady Statistic RV Percent Paced: 10 %
Date Time Interrogation Session: 20221130113750
Implantable Lead Implant Date: 20130726
Implantable Lead Implant Date: 20130726
Implantable Lead Location: 753859
Implantable Lead Location: 753860
Implantable Lead Model: 1948
Implantable Pulse Generator Implant Date: 20221114
Lead Channel Impedance Value: 337.5 Ohm
Lead Channel Impedance Value: 525 Ohm
Lead Channel Pacing Threshold Amplitude: 0.75 V
Lead Channel Pacing Threshold Amplitude: 0.875 V
Lead Channel Pacing Threshold Pulse Width: 0.5 ms
Lead Channel Pacing Threshold Pulse Width: 0.5 ms
Lead Channel Sensing Intrinsic Amplitude: 4.2 mV
Lead Channel Sensing Intrinsic Amplitude: 8.2 mV
Lead Channel Setting Pacing Amplitude: 1.125
Lead Channel Setting Pacing Amplitude: 1.75 V
Lead Channel Setting Pacing Pulse Width: 0.5 ms
Lead Channel Setting Sensing Sensitivity: 2 mV
Pulse Gen Model: 2272
Pulse Gen Serial Number: 3975489

## 2021-04-03 NOTE — Patient Instructions (Signed)

## 2021-04-03 NOTE — Progress Notes (Signed)

## 2021-04-24 ENCOUNTER — Ambulatory Visit: Payer: Medicare HMO | Admitting: Cardiology

## 2021-05-09 ENCOUNTER — Ambulatory Visit: Payer: Medicare HMO | Admitting: Cardiology

## 2021-05-09 ENCOUNTER — Encounter: Payer: Self-pay | Admitting: Cardiology

## 2021-05-09 ENCOUNTER — Other Ambulatory Visit: Payer: Self-pay

## 2021-05-09 VITALS — BP 143/78 | HR 47 | Temp 95.7°F | Resp 17 | Ht 66.0 in | Wt 169.6 lb

## 2021-05-09 DIAGNOSIS — E7800 Pure hypercholesterolemia, unspecified: Secondary | ICD-10-CM

## 2021-05-09 DIAGNOSIS — I10 Essential (primary) hypertension: Secondary | ICD-10-CM

## 2021-05-09 DIAGNOSIS — N1831 Chronic kidney disease, stage 3a: Secondary | ICD-10-CM

## 2021-05-09 DIAGNOSIS — I495 Sick sinus syndrome: Secondary | ICD-10-CM

## 2021-05-09 DIAGNOSIS — Z950 Presence of cardiac pacemaker: Secondary | ICD-10-CM

## 2021-05-09 MED ORDER — HYDRALAZINE HCL 50 MG PO TABS: 50.0000 mg | ORAL_TABLET | Freq: Three times a day (TID) | ORAL | 3 refills | Status: DC

## 2021-05-09 NOTE — Progress Notes (Signed)
Primary Physician/Referring:  [name], MD  Patient ID: [name] [name], male    DOB: March 13, 1936, 86 y.o.   MRN: [id number]  Chief Complaint  Patient presents with   Pacemaker Problem    3.5 MONTHS   HPI:    [name] [name]  is a 86 y.o. African-American male with history of paroxysmal atrial flutter status post ablation but had recurrence of atrial flutter and hence has been on anticoagulation, sick sinus syndrome S/P pacemaker implantation in 2013, underwent generator change on 03/18/2021,  atrial fibrillation, prostate cancer presently on chemotherapy.  He is tolerating Eliquis without bleeding complications.  Feels well and essentially asymptomatic.  He is here for management of hypertension, atrial fibrillation and renal issues. Denies chest pain or palpitations.  He tolerated hydralazine but has been out of the prescriptions.  He prefers Investment banker, corporate.  Past Medical History:  Diagnosis Date   AKI (acute kidney injury) ([place]) 06/08/2018   Atrial flutter ([place])    Carcinoma of prostate ([place]) 12/30/2011   Treated with seed/radiation therapy.    Colostomy in place [facility])    Difficulty sleeping    lives in shelter currently   Diverticulitis    Dyslipidemia 12/30/2011   Encounter for care of pacemaker 06/17/2018   Frequency of urination    Heart murmur    Hypertension 04/30/11   Cardioversion 06/16/11   Near syncope 06/07/2018   Nocturia    Orthostatic hypotension 06/08/2018   Pacemaker 7/13    sick sinus syndrome/St Jude pacemaker   Paroxysmal atrial flutter ([place]) 06/16/2011   Prostate cancer ([place])    Sick sinus syndrome ([place]) 11/28/2011   Syncope and collapse 11/20/2011   Atrial and ventricular standstill > 3 seconds.    Thyroid disease    had "overactive thyroid in 1997" - no known problem since   Past Surgical History:  Procedure Laterality Date   ATRIAL FLUTTER ABLATION N/A 11/21/2011   Procedure: ATRIAL FLUTTER ABLATION;  Surgeon: Thompson Grayer, MD;  Location: [facility] CATH LAB;   Service: Cardiovascular;  Laterality: N/A;   CARDIAC ELECTROPHYSIOLOGY STUDY AND ABLATION  7/13   CARDIOVERSION  06/16/2011   Procedure: CARDIOVERSION;  Surgeon: Laverda Page, MD;  Location: [place];  Service: Cardiovascular;  Laterality: N/A;   COLON SURGERY  05/13/2013   COLOSTOMY CLOSURE N/A 11/25/2013   Procedure: COLOSTOMY CLOSURE;  Surgeon: Earnstine Regal, MD;  [facility];  Service: General;  Laterality: N/A;   COLOSTOMY CLOSURE  11/25/2013   PACEMAKER INSERTION  11/28/11   SJM Accent DR RF implanted by Dr Rayann Heman   PARTIAL COLECTOMY N/A 05/13/2013   Procedure: sigmoid COLECTOMY  colostomy ;  Surgeon: Earnstine Regal, MD;  [facility];  Service: General;  Laterality: N/A;   PERMANENT PACEMAKER INSERTION N/A 11/28/2011   Procedure: PERMANENT PACEMAKER INSERTION;  Surgeon: Thompson Grayer, MD;  Location: [facility] CATH LAB;  Service: Cardiovascular;  Laterality: N/A;   PPM GENERATOR CHANGEOUT N/A 03/18/2021   Procedure: PPM GENERATOR CHANGEOUT;  Surgeon: Evans Lance, MD;  Location: [place] CV LAB;  Service: Cardiovascular;  Laterality: N/A;   RADIOACTIVE SEED IMPLANT     TONSILLECTOMY     Social History   Tobacco Use   Smoking status: Former    Packs/day: 0.50    Years: 50.00    Pack years: 25.00    Types: Cigarettes    Quit date: 07/20/2009    Years since quitting: 11.8   Smokeless tobacco: Never  Substance Use Topics  Alcohol use: No   Marital Status: Single  ROS  Review of Systems  Cardiovascular:  Negative for chest pain, dyspnea on exertion and leg swelling.  Gastrointestinal:  Negative for melena.  Objective  Blood pressure (!) 143/78, pulse (!) 47, temperature (!) 95.7 F (35.4 C), temperature source Temporal, resp. rate 17, height 5\' 6"  (1.676 m), weight 169 lb 9.6 oz (76.9 kg), SpO2 98 %.  Vitals with BMI 05/09/2021 05/09/2021 03/18/2021  Height - 5\' 6"  -  Weight - 169 lbs 10 oz -  BMI - 50.09 -  Systolic 381 829 937  Diastolic 78 87 84  Pulse 47 72 61      Physical Exam Constitutional:      General: He is not in acute distress.    Appearance: He is well-developed.  Neck:     Vascular: No carotid bruit or JVD.  Cardiovascular:     Rate and Rhythm: Normal rate and regular rhythm.     Pulses: Normal pulses and intact distal pulses.     Heart sounds: Normal heart sounds. No murmur heard.   No gallop.  Pulmonary:     Effort: Pulmonary effort is normal.     Breath sounds: Normal breath sounds.  Abdominal:     General: Bowel sounds are normal.     Palpations: Abdomen is soft.  Musculoskeletal:     Right lower leg: No edema.     Left lower leg: No edema.  Skin:    General: Skin is warm.   Laboratory examination:   Recent Labs    01/17/21 0954 03/13/21 0933  NA 144 142  K 3.8 4.2  CL 104 110*  CO2 23 24  GLUCOSE 125* 95  BUN 15 22  CREATININE 1.43* 1.26  CALCIUM 9.2 8.3*    CrCl cannot be calculated (Patient's most recent lab result is older than the maximum 21 days allowed.).  CMP Latest Ref Rng & Units 03/13/2021 01/17/2021 12/29/2019  Glucose 70 - 99 mg/dL 95 125(H) 120(H)  BUN 8 - 27 mg/dL 22 15 15   Creatinine 0.76 - 1.27 mg/dL 1.26 1.43(H) 1.55(H)  Sodium 134 - 144 mmol/L 142 144 141  Potassium 3.5 - 5.2 mmol/L 4.2 3.8 4.3  Chloride 96 - 106 mmol/L 110(H) 104 103  CO2 20 - 29 mmol/L 24 23 23   Calcium 8.6 - 10.2 mg/dL 8.3(L) 9.2 9.4  Total Protein 6.0 - 8.5 g/dL - 6.7 -  Total Bilirubin 0.0 - 1.2 mg/dL - 0.7 -  Alkaline Phos 44 - 121 IU/L - 85 -  AST 0 - 40 IU/L - 17 -  ALT 0 - 44 IU/L - 10 -   CBC Latest Ref Rng & Units 03/13/2021 01/17/2021 12/29/2019  WBC 3.4 - 10.8 x10E3/uL 9.7 5.6 5.1  Hemoglobin 13.0 - 17.7 g/dL 12.3(L) 12.7(L) 13.3  Hematocrit 37.5 - 51.0 % 36.8(L) 38.6 39.9  Platelets 150 - 450 x10E3/uL 207 217 254   Lipid Panel     Component Value Date/Time   CHOL 134 01/17/2021 0954   TRIG 102 01/17/2021 0954   HDL 67 01/17/2021 0954   LDLCALC 48 01/17/2021 0954   HEMOGLOBIN A1C Lab Results   Component Value Date   HGBA1C 6.1 (H) 01/17/2021   MPG 139.85 03/01/2019   TSH Recent Labs    01/17/21 0954  TSH 0.824    Medications and allergies   Allergies  Allergen Reactions   Xarelto [Rivaroxaban] Other (See Comments)    "I didn't like the way  it made me feel" (patient didn't elaborate)     Outpatient Medications Prior to Visit  Medication Sig Dispense Refill   abiraterone acetate (ZYTIGA) 250 MG tablet Take 1,000 mg by mouth daily.     atorvastatin (LIPITOR) 20 MG tablet TAKE 1 TABLET EVERY DAY 90 tablet 3   diltiazem (CARDIZEM CD) 180 MG 24 hr capsule TAKE 1 CAPSULE EVERY DAY 90 capsule 3   ELIQUIS 2.5 MG TABS tablet TAKE 1 TABLET TWICE DAILY 180 tablet 1   metoprolol tartrate (LOPRESSOR) 50 MG tablet TAKE 1 TABLET TWICE DAILY 180 tablet 3   olmesartan-hydrochlorothiazide (BENICAR HCT) 40-25 MG tablet Take 1 tablet by mouth daily. 30 tablet 3   predniSONE (DELTASONE) 5 MG tablet Take 5 mg by mouth daily.     tamsulosin (FLOMAX) 0.4 MG CAPS capsule TAKE 1 CAPSULE AT BEDTIME 90 capsule 0   hydrALAZINE (APRESOLINE) 50 MG tablet Take 1 tablet (50 mg total) by mouth 3 (three) times daily. (Patient not taking: Reported on 03/13/2021) 90 tablet 3   No facility-administered medications prior to visit.    Radiology:  No results found.  Cardiac Studies:   Echocardiogram 06/08/2018: 1. The left ventricle has hyperdynamic systolic function of >14%. The cavity size is normal. There is no increas ed left ventricular wall thickness. Echo evidence of normal diastolic filling patterns. Normal left ventricular filling pressures. 2. The aortic valve is tricuspid in structure. There is mild thickening and mild calcification of the aortic valve.   Dual-chamber pacemaker transmission 12/13/2020: Longevity consign 3 months. AP 81%, VP 23%. There were 22 AMS episodes, mode switch <1%. AT/AF burden <1%. EGM = brief AT.Marland Kitchen   Scheduled  In office pacemaker check 07/16/20  Single (S)/Dual  (D)/BV: D. Presenting APVS. Pacemaker dependant:  Yes. Underlying Silent atrium with no escape. AP 90%, VP 13%. AMS Episodes 1. 07/08/2020. PAC no significant arrhythmias.    HVR 0. Longevity 10 months. Magnet rate: >85%. Lead measurements: Stable. Histogram: Low (L)/normal (N)/high (H)  Normal. Patient activity Normal.   Observations: Normal pacemaker function. Changes: None. Pacemaker EOL alert siren demonstrated to patient.   EKG:  EKG 01/16/2021: Atrial  Rhythm at the rate of 60 bpm, normal axis, incomplete right bundle branch block.  No evidence of ischemia.   EKG 07/16/2020: Atrially paced rhythm at rate of 68 bpm, normal axis, no evidence of ischemia.  PACs (2).    No significant change from EKG 09/08/2019:   04/24/19: Ectopic atrial tachycardia with wide complex, no further rhythm analysis attempted  Assessment     ICD-10-CM   1. Sick sinus syndrome (HCC)  I49.5     2. Essential hypertension, benign  I10 hydrALAZINE (APRESOLINE) 50 MG tablet    3. Hypercholesteremia  E78.00     4. Pacemaker-St.Jude Accent DR-RF dual chamber pacemaker   Z95.0     5. Stage 3a chronic kidney disease (HCC)  N18.31       Meds ordered this encounter  Medications   hydrALAZINE (APRESOLINE) 50 MG tablet    Sig: Take 1 tablet (50 mg total) by mouth 3 (three) times daily.    Dispense:  300 tablet    Refill:  3     Medications Discontinued During This Encounter  Medication Reason   hydrALAZINE (APRESOLINE) 50 MG tablet Reorder     Recommendations:   Valentine Barney  is a 86 y.o. African-American male with history of paroxysmal atrial flutter status post ablation but had recurrence of atrial flutter and hence has  been on anticoagulation, sick sinus syndrome S/P pacemaker implantation in 2013, underwent generator change on 03/18/2021,  atrial fibrillation, prostate cancer presently on chemotherapy.  He is presently tolerating all his medications well and has not had any issues controlling his  blood pressure, he is out of hydralazine.  He is presently on RPM in our office but has not been transmitting his blood pressure measurements.  I have refilled his prescription for hydralazine 50 mg p.o. 3 times daily.  He is also tolerating anticoagulation without bleeding diathesis and is maintaining sinus rhythm.    His pacemaker site has healed well.  He needs inpatient pacemaker check in 1 to 2 months.  Lipids are under excellent control, his renal function has improved significantly and is back to stage II/stage IIIa chronic kidney disease.  Otherwise stable from cardiac standpoint, I will see him back in 6 months for follow-up.    Adrian Prows, MD, [facility] 05/09/2021, 10:22 AM Office: [phone]

## 2021-05-28 ENCOUNTER — Other Ambulatory Visit: Payer: Self-pay | Admitting: Cardiology

## 2021-06-20 ENCOUNTER — Encounter: Payer: Medicare HMO | Admitting: Internal Medicine

## 2021-06-26 ENCOUNTER — Other Ambulatory Visit: Payer: Self-pay | Admitting: Cardiology

## 2021-08-26 IMAGING — DX DG CHEST 1V PORT
1 series · 1 of 1 positions shown · non-contrast
Comparison: 10/27/2018

CLINICAL DATA: Syncope

EXAM:
PORTABLE CHEST 1 VIEW

[chest ap]
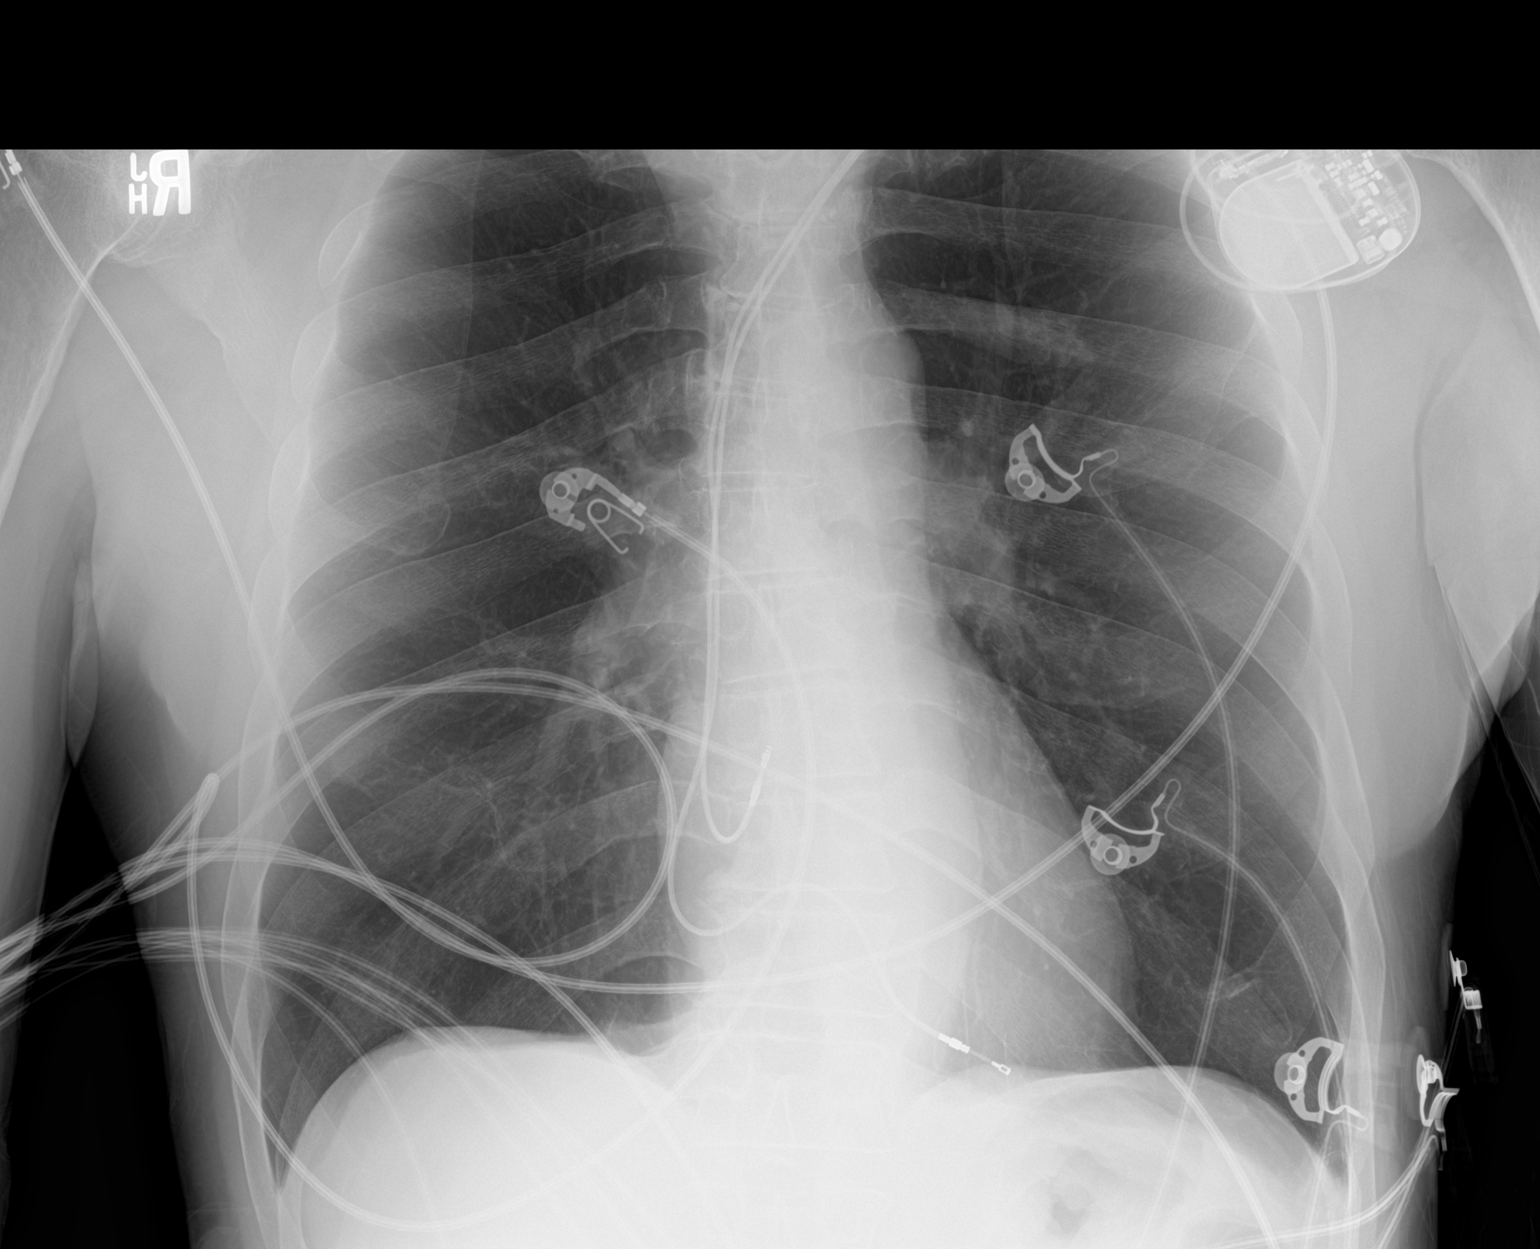

[1 of 1 positions shown; findings below may reference images not displayed]

FINDINGS: Left pacer remains in place, unchanged. Stable hyperinflation of the
lungs. Heart and mediastinal contours are within normal limits. No
focal opacities or effusions. No acute bony abnormality.
IMPRESSION: Stable hyperinflation.  No active cardiopulmonary disease.

## 2021-08-27 IMAGING — US US RENAL
1 series · 14 of 25 positions shown · non-contrast
Comparison: October 27, 2018

CLINICAL DATA: Acute on chronic renal failure

EXAM:
RENAL / URINARY TRACT ULTRASOUND COMPLETE

[Series 1: us renal · 14 of 40 slices shown]
[im 1/40]
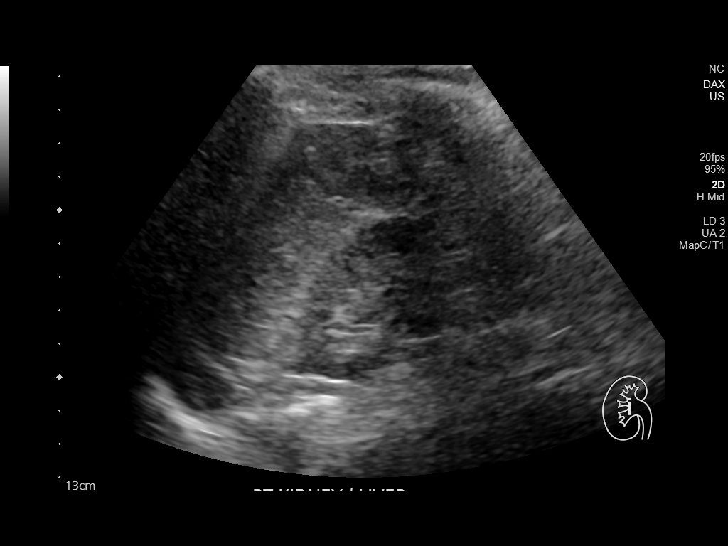
[im 4/40]
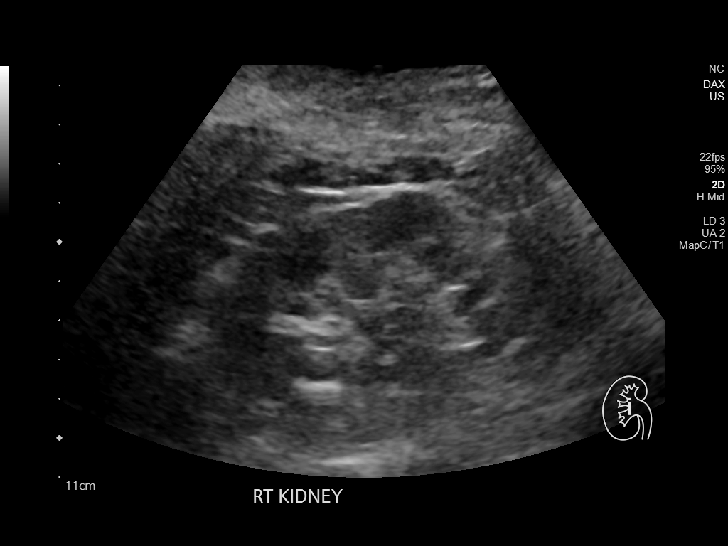
[im 7/40]
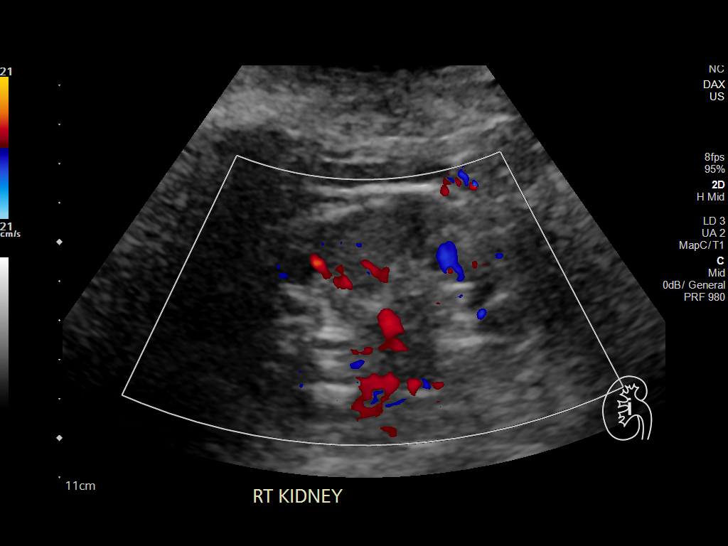
[im 10/40]
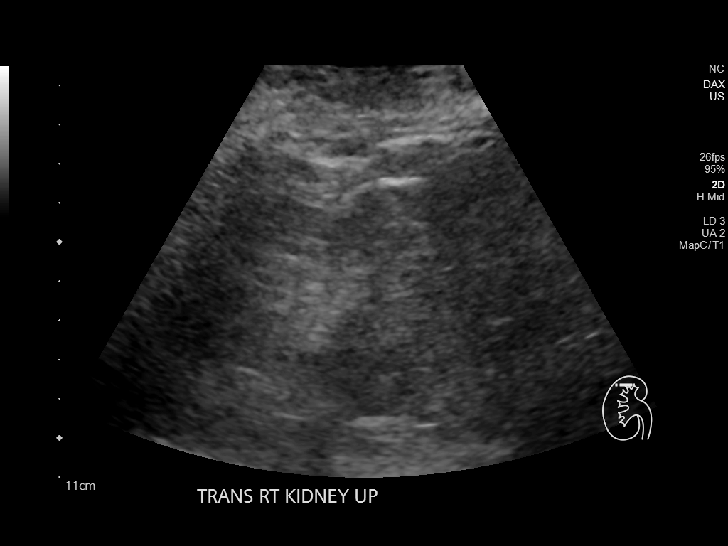
[im 14/40]
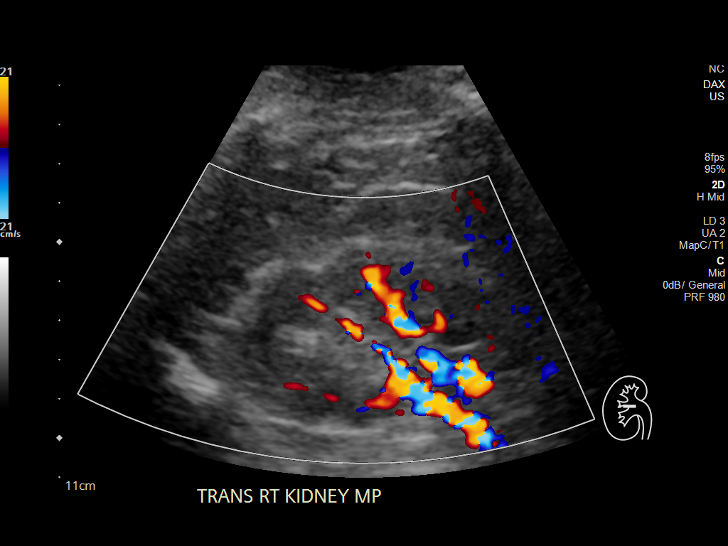
[im 15/40]
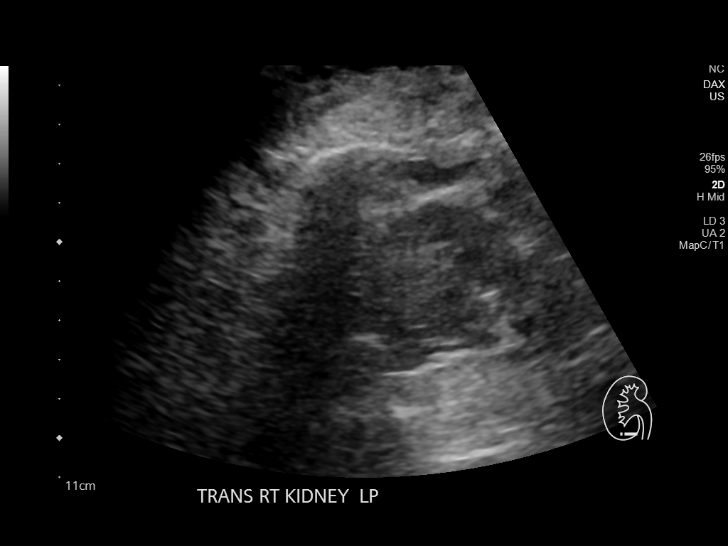
[im 18/40]
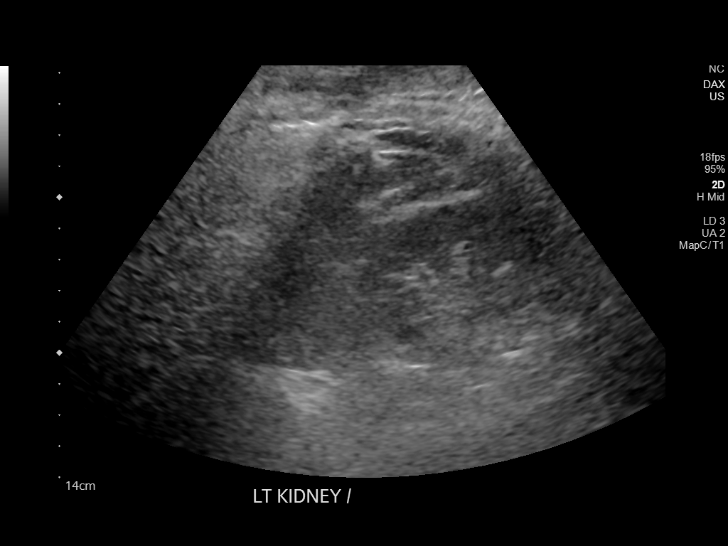
[im 22/40]
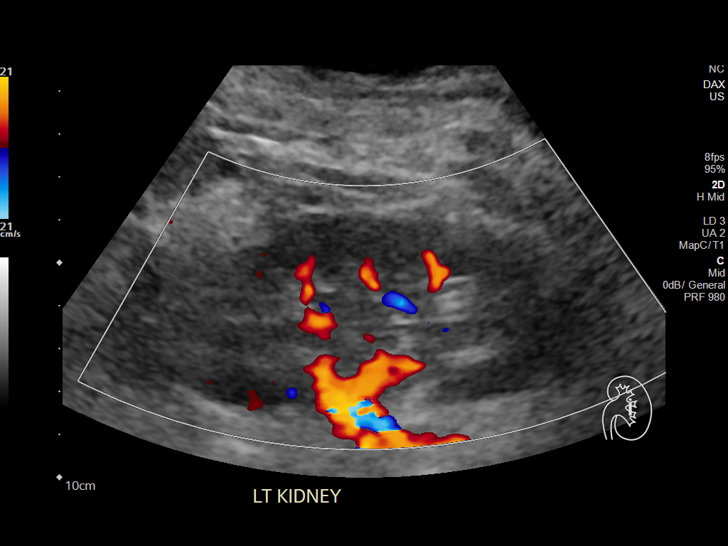
[im 25/40]
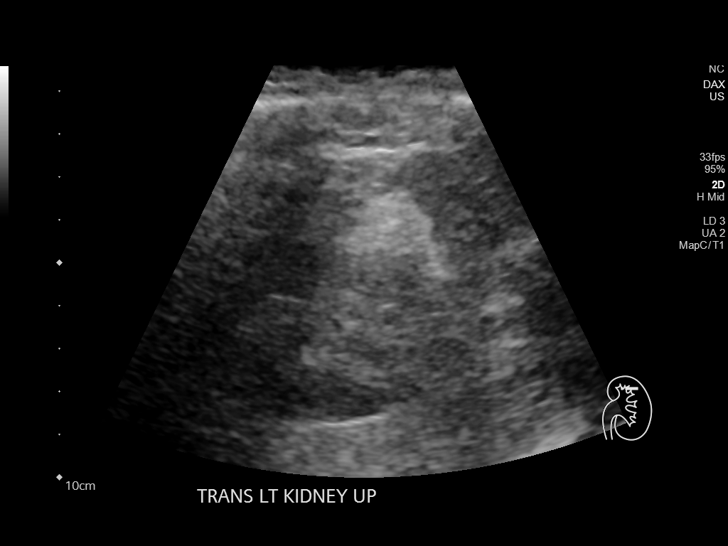
[im 27/40]
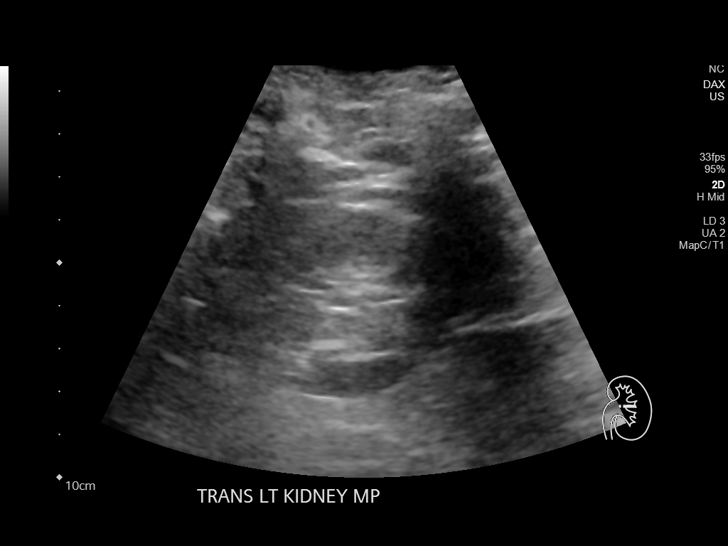
[im 30/40]
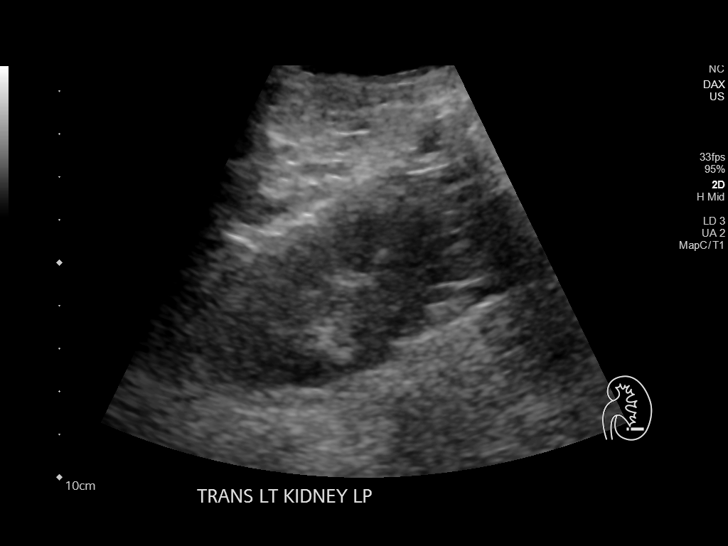
[im 33/40]
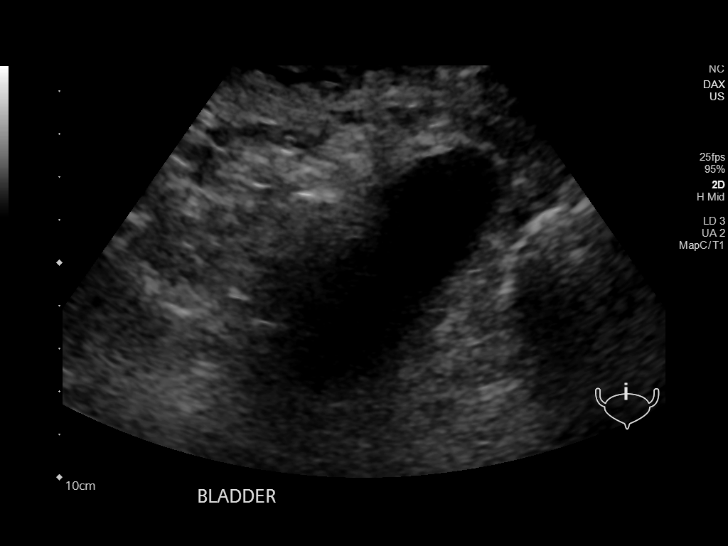
[im 36/40]
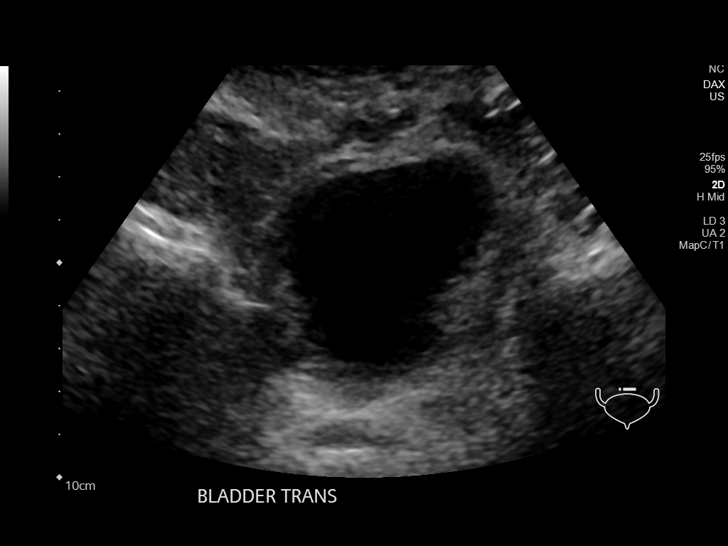
[im 40/40]
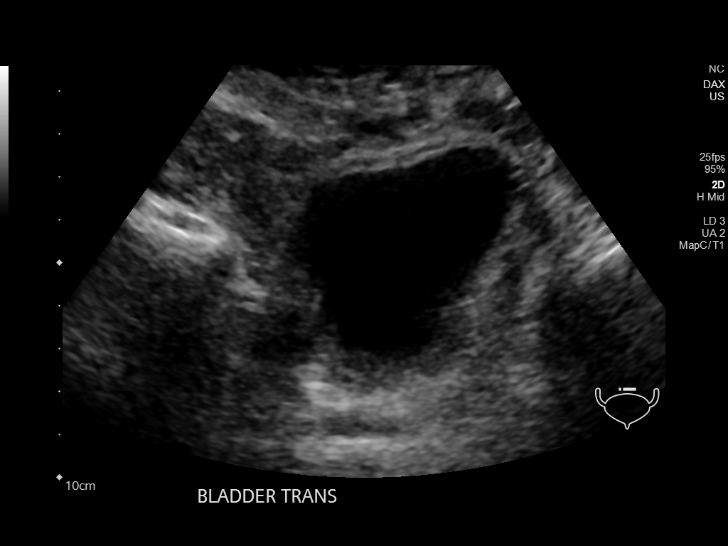

[14 of 25 positions shown; findings below may reference images not displayed]

FINDINGS: Right Kidney:

Renal measurements: 8.7 x 4.5 x 4.7 = volume: 97 mL. There is mildly
increased echogenicity throughout. No mass or hydronephrosis
visualized.

Left Kidney:

Renal measurements: 9.3 x 4.6 x 4.6 = volume: 102 mL. There is
mildly increased echogenicity throughout. No mass or hydronephrosis
visualized.

Bladder:

There is a partially distended bladder.
IMPRESSION: Diffusely increased parenchymal echogenicity, consistent with
medical renal disease. No acute hydronephrosis or renal calculi.

## 2021-09-10 ENCOUNTER — Encounter: Payer: Self-pay | Admitting: Podiatry

## 2021-09-10 ENCOUNTER — Ambulatory Visit (INDEPENDENT_AMBULATORY_CARE_PROVIDER_SITE_OTHER): Payer: Medicare HMO | Admitting: Podiatry

## 2021-09-10 DIAGNOSIS — Z599 Problem related to housing and economic circumstances, unspecified: Secondary | ICD-10-CM | POA: Insufficient documentation

## 2021-09-10 DIAGNOSIS — Z5900 Homelessness unspecified: Secondary | ICD-10-CM | POA: Insufficient documentation

## 2021-09-10 DIAGNOSIS — B351 Tinea unguium: Secondary | ICD-10-CM

## 2021-09-10 DIAGNOSIS — M79674 Pain in right toe(s): Secondary | ICD-10-CM | POA: Diagnosis not present

## 2021-09-10 DIAGNOSIS — M79675 Pain in left toe(s): Secondary | ICD-10-CM | POA: Diagnosis not present

## 2021-09-10 NOTE — Progress Notes (Signed)
?  Subjective:  ?Patient ID: [name] [name], male    DOB: 05/17/1935,  MRN: [id number] ? ?Chief Complaint  ?Patient presents with  ? Nail Problem  ?  (NP) Toe Nail dystrophy  ? ? ?86 y.o. male presents with the above complaint. History confirmed with patient.  Nails are thickened elongated they have discoloration and he is unable to cut them ? ?Objective:  ?Physical Exam: ?warm, good capillary refill, no trophic changes or ulcerative lesions, normal DP and PT pulses, and normal sensory exam. ?Left Foot: dystrophic yellowed discolored nail plates with subungual debris ?Right Foot: dystrophic yellowed discolored nail plates with subungual debris ? ? ?Assessment:  ? ?1. Pain due to onychomycosis of toenails of both feet   ? ? ? ?Plan:  ?Patient was evaluated and treated and all questions answered. ? ?Discussed the etiology and treatment options for the condition in detail with the patient. Educated patient on the topical and oral treatment options for mycotic nails. Recommended debridement of the nails today. Sharp and mechanical debridement performed of all painful and mycotic nails today. Nails debrided in length and thickness using a nail nipper to level of comfort. Discussed treatment options including appropriate shoe gear.  ? ? ? ?Return in about 3 months (around 12/11/2021) for painful fungal nails.  ? ?

## 2021-10-09 DIAGNOSIS — C7951 Secondary malignant neoplasm of bone: Secondary | ICD-10-CM | POA: Diagnosis not present

## 2021-10-09 DIAGNOSIS — C61 Malignant neoplasm of prostate: Secondary | ICD-10-CM | POA: Diagnosis not present

## 2021-10-26 DIAGNOSIS — Z45018 Encounter for adjustment and management of other part of cardiac pacemaker: Secondary | ICD-10-CM | POA: Diagnosis not present

## 2021-10-26 DIAGNOSIS — I495 Sick sinus syndrome: Secondary | ICD-10-CM | POA: Diagnosis not present

## 2021-11-06 ENCOUNTER — Encounter: Payer: Self-pay | Admitting: Cardiology

## 2021-11-06 ENCOUNTER — Ambulatory Visit: Payer: Medicare HMO | Admitting: Cardiology

## 2021-11-06 VITALS — BP 127/67 | HR 70 | Temp 97.7°F | Resp 16 | Ht 66.0 in | Wt 165.0 lb

## 2021-11-06 DIAGNOSIS — C7951 Secondary malignant neoplasm of bone: Secondary | ICD-10-CM | POA: Diagnosis not present

## 2021-11-06 DIAGNOSIS — I442 Atrioventricular block, complete: Secondary | ICD-10-CM | POA: Insufficient documentation

## 2021-11-06 DIAGNOSIS — I10 Essential (primary) hypertension: Secondary | ICD-10-CM

## 2021-11-06 DIAGNOSIS — E7800 Pure hypercholesterolemia, unspecified: Secondary | ICD-10-CM

## 2021-11-06 DIAGNOSIS — I4892 Unspecified atrial flutter: Secondary | ICD-10-CM | POA: Diagnosis not present

## 2021-11-06 DIAGNOSIS — Z950 Presence of cardiac pacemaker: Secondary | ICD-10-CM | POA: Diagnosis not present

## 2021-11-06 DIAGNOSIS — I495 Sick sinus syndrome: Secondary | ICD-10-CM | POA: Diagnosis not present

## 2021-11-06 DIAGNOSIS — R739 Hyperglycemia, unspecified: Secondary | ICD-10-CM | POA: Diagnosis not present

## 2021-11-06 DIAGNOSIS — C61 Malignant neoplasm of prostate: Secondary | ICD-10-CM | POA: Diagnosis not present

## 2021-11-06 HISTORY — DX: Atrioventricular block, complete: I44.2

## 2021-11-06 NOTE — Progress Notes (Signed)
Primary Physician/Referring:  [name], MD  Patient ID: [name] [name], male    DOB: 02/10/1936, 86 y.o.   MRN: [id number]  Chief Complaint  Patient presents with   Atrial Fibrillation   Hypertension   Hyperlipidemia   Follow-up    6 month   HPI:    [name] [name]  is a 86 y.o. African-American male with history of paroxysmal atrial flutter status post ablation but had recurrence of atrial flutter and also has had paroxysmal atrial fibrillation hence has been on anticoagulation, sick sinus syndrome S/P pacemaker implantation in 2013, underwent generator change on 03/18/2021,  prostate cancer completed chemotherapy.  He is tolerating Eliquis without bleeding complications.  Feels well and essentially asymptomatic.  He is here for management of hypertension, atrial fibrillation. Denies chest pain or palpitations.   He prefers Investment banker, corporate.  Past Medical History:  Diagnosis Date   AKI (acute kidney injury) ([place]) 06/08/2018   Atrial flutter ([place])    Carcinoma of prostate ([place]) 12/30/2011   Treated with seed/radiation therapy.    Colostomy in place [facility])    Difficulty sleeping    lives in shelter currently   Diverticulitis    Dyslipidemia 12/30/2011   Encounter for care of pacemaker 06/17/2018   Frequency of urination    Heart block AV complete ([place]) 11/06/2021   Heart murmur    Hypertension 04/30/11   Cardioversion 06/16/11   Near syncope 06/07/2018   Nocturia    Orthostatic hypotension 06/08/2018   Pacemaker 7/13    sick sinus syndrome/St Jude pacemaker   Paroxysmal atrial flutter ([place]) 06/16/2011   Prostate cancer ([place])    Sick sinus syndrome ([place]) 11/28/2011   Syncope and collapse 11/20/2011   Atrial and ventricular standstill > 3 seconds.    Thyroid disease    had "overactive thyroid in 1997" - no known problem since   Past Surgical History:  Procedure Laterality Date   ATRIAL FLUTTER ABLATION N/A 11/21/2011   Procedure: ATRIAL FLUTTER ABLATION;  Surgeon: Thompson Grayer,  MD;  Location: [facility] CATH LAB;  Service: Cardiovascular;  Laterality: N/A;   CARDIAC ELECTROPHYSIOLOGY STUDY AND ABLATION  7/13   CARDIOVERSION  06/16/2011   Procedure: CARDIOVERSION;  Surgeon: Laverda Page, MD;  Location: [place];  Service: Cardiovascular;  Laterality: N/A;   COLON SURGERY  05/13/2013   COLOSTOMY CLOSURE N/A 11/25/2013   Procedure: COLOSTOMY CLOSURE;  Surgeon: Earnstine Regal, MD;  [facility];  Service: General;  Laterality: N/A;   COLOSTOMY CLOSURE  11/25/2013   PACEMAKER INSERTION  11/28/11   SJM Accent DR RF implanted by Dr Rayann Heman   PARTIAL COLECTOMY N/A 05/13/2013   Procedure: sigmoid COLECTOMY  colostomy ;  Surgeon: Earnstine Regal, MD;  [facility];  Service: General;  Laterality: N/A;   PERMANENT PACEMAKER INSERTION N/A 11/28/2011   Procedure: PERMANENT PACEMAKER INSERTION;  Surgeon: Thompson Grayer, MD;  Location: [facility] CATH LAB;  Service: Cardiovascular;  Laterality: N/A;   PPM GENERATOR CHANGEOUT N/A 03/18/2021   Procedure: PPM GENERATOR CHANGEOUT;  Surgeon: Evans Lance, MD;  Location: [place] CV LAB;  Service: Cardiovascular;  Laterality: N/A;   RADIOACTIVE SEED IMPLANT     TONSILLECTOMY     Social History   Tobacco Use   Smoking status: Former    Packs/day: 0.50    Years: 50.00    Total pack years: 25.00    Types: Cigarettes    Quit date: 07/20/2009    Years since quitting: 12.3   Smokeless  tobacco: Never  Substance Use Topics   Alcohol use: No   Marital Status: Single  ROS  Review of Systems  Cardiovascular:  Negative for chest pain, dyspnea on exertion and leg swelling.  Gastrointestinal:  Negative for melena.   Objective  Blood pressure 127/67, pulse 70, temperature 97.7 F (36.5 C), resp. rate 16, height _0  (1.676 m), weight 165 lb (74.8 kg), SpO2 97 %.     11/06/2021    9:45 AM 05/09/2021    9:50 AM 05/09/2021    9:49 AM  Vitals with BMI  Height _1   _2   Weight 165 lbs  169 lbs 10 oz  BMI 29.24  46.28  Systolic 638 177 116   Diastolic 67 78 87  Pulse 70 47 72     Physical Exam Neck:     Vascular: No JVD.  Cardiovascular:     Rate and Rhythm: Normal rate and regular rhythm.     Pulses: Intact distal pulses.     Heart sounds: Normal heart sounds. No murmur heard.    No gallop.  Pulmonary:     Effort: Pulmonary effort is normal.     Breath sounds: Normal breath sounds.  Abdominal:     General: Bowel sounds are normal.     Palpations: Abdomen is soft.  Musculoskeletal:     Right lower leg: No edema.     Left lower leg: No edema.    Laboratory examination:   Recent Labs    01/17/21 0954 03/13/21 0933  NA 144 142  K 3.8 4.2  CL 104 110*  CO2 23 24  GLUCOSE 125* 95  BUN 15 22  CREATININE 1.43* 1.26  CALCIUM 9.2 8.3*    CrCl cannot be calculated (Patient's most recent lab result is older than the maximum 21 days allowed.).     Latest Ref Rng & Units 03/13/2021    9:33 AM 01/17/2021    9:54 AM 12/29/2019    1:12 PM  CMP  Glucose 70 - 99 mg/dL 95  125  120   BUN 8 - 27 mg/dL _3 Creatinine 0.76 - 1.27 mg/dL 1.26  1.43  1.55   Sodium 134 - 144 mmol/L 142  144  141   Potassium 3.5 - 5.2 mmol/L 4.2  3.8  4.3   Chloride 96 - 106 mmol/L 110  104  103   CO2 20 - 29 mmol/L _4 Calcium 8.6 - 10.2 mg/dL 8.3  9.2  9.4   Total Protein 6.0 - 8.5 g/dL  6.7    Total Bilirubin 0.0 - 1.2 mg/dL  0.7    Alkaline Phos 44 - 121 IU/L  85    AST 0 - 40 IU/L  17    ALT 0 - 44 IU/L  10        Latest Ref Rng & Units 03/13/2021    9:33 AM 01/17/2021    9:54 AM 12/29/2019    1:12 PM  CBC  WBC 3.4 - 10.8 x10E3/uL 9.7  5.6  5.1   Hemoglobin 13.0 - 17.7 g/dL 12.3  12.7  13.3   Hematocrit 37.5 - 51.0 % 36.8  38.6  39.9   Platelets 150 - 450 x10E3/uL 207  217  254    Lipid Panel     Component Value Date/Time   CHOL 134 01/17/2021 0954   TRIG 102 01/17/2021 0954   HDL 67 01/17/2021 0954  LDLCALC 48 01/17/2021 0954   HEMOGLOBIN A1C Lab Results  Component Value Date   HGBA1C 6.1 (H)  01/17/2021   MPG 139.85 03/01/2019   TSH Recent Labs    01/17/21 0954  TSH 0.824  External labs:  Creatinine, Serum 0.900 mg/ 08/01/2021 Potassium 4.200 mEq 08/01/2021 ALT (SGPT) 11.000 IU/ 08/01/2021  Medications and allergies   Allergies  Allergen Reactions   Xarelto [Rivaroxaban] Other (See Comments)    "I didn't like the way it made me feel" (patient didn't elaborate)     Current Outpatient Medications:    abiraterone acetate (ZYTIGA) 250 MG tablet, Take 1,000 mg by mouth daily., Disp: , Rfl:    atorvastatin (LIPITOR) 20 MG tablet, TAKE 1 TABLET BY MOUTH DAILY, Disp: 90 tablet, Rfl: 3   dabigatran (PRADAXA) 150 MG CAPS capsule, Take 150 mg by mouth 2 (two) times daily., Disp: , Rfl:    ELIQUIS 2.5 MG TABS tablet, TAKE 1 TABLET TWICE DAILY, Disp: 180 tablet, Rfl: 1   hydrALAZINE (APRESOLINE) 50 MG tablet, Take 1 tablet (50 mg total) by mouth 3 (three) times daily., Disp: 300 tablet, Rfl: 3   metoprolol tartrate (LOPRESSOR) 50 MG tablet, TAKE 1 TABLET TWICE DAILY, Disp: 180 tablet, Rfl: 3   olmesartan-hydrochlorothiazide (BENICAR HCT) 40-25 MG tablet, Take 1 tablet by mouth daily., Disp: 30 tablet, Rfl: 3   ondansetron (ZOFRAN) 4 MG tablet, Take 4 mg by mouth every 8 (eight) hours as needed for nausea or vomiting., Disp: , Rfl:    predniSONE (DELTASONE) 5 MG tablet, Take 5 mg by mouth daily., Disp: , Rfl:    tamsulosin (FLOMAX) 0.4 MG CAPS capsule, TAKE 1 CAPSULE AT BEDTIME, Disp: 90 capsule, Rfl: 1    Radiology:  No results found.  Cardiac Studies:   Echocardiogram 06/08/2018: 1. The left ventricle has hyperdynamic systolic function of >72%. The cavity size is normal. There is no increas ed left ventricular wall thickness. Echo evidence of normal diastolic filling patterns. Normal left ventricular filling pressures. 2. The aortic valve is tricuspid in structure. There is mild thickening and mild calcification of the aortic valve.   Pacemaker-St.Jude Accent DR-RF dual chamber  pacemaker 2013:   Remote dual-chamber pacemaker transmission 09/11/2021: AP 65%, VP 9%.  Longevity 9 years and 7 months.  Lead impedance and thresholds within normal limits.  There were brief mode switches, AT/AF burden <1%.  Normal pacemaker function.  EKG:  EKG 11/06/2021: Atrially paced rhythm with first-degree AV block at rate of 76 bpm with frequent PACs in bigeminal pattern.  Normal QT interval.  EKG 01/16/2021: Atrial  Rhythm at the rate of 60 bpm, normal axis, incomplete right bundle branch block.  No evidence of ischemia.   Assessment     ICD-10-CM   1. Sick sinus syndrome (HCC)  I49.5     2. Pacemaker-St.Jude Accent DR-RF dual chamber pacemaker   Z95.0     3. Essential hypertension, benign  I10 CBC    CMP14+EGFR    4. Paroxysmal atrial flutter (HCC)  I48.92 EKG 12-Lead    5. Hyperglycemia  R73.9 Hgb A1c w/o eAG    6. Hypercholesteremia  E78.00 Lipid Panel With LDL/HDL Ratio      CHA2DS2-VASc Score is 3.  Yearly risk of stroke: 3.2% (A, HTN).  Score of 1=0.6; 2=2.2; 3=3.2; 4=4.8; 5=7.2; 6=9.8; 7=>9.8) -(CHF; HTN; vasc disease DM,  Male = 1; Age <65 =0; 65-74 = 1,  >75 =2; stroke/embolism= 2).   No orders of the defined types were  placed in this encounter.    Medications Discontinued During This Encounter  Medication Reason   diltiazem (CARDIZEM CD) 180 MG 24 hr capsule      Recommendations:   [name] [name]  is a 86 y.o. African-American male with history of paroxysmal atrial flutter status post ablation but had recurrence of atrial flutter and also has had paroxysmal atrial fibrillation hence has been on anticoagulation, sick sinus syndrome S/P pacemaker implantation in 2013, underwent generator change on 03/18/2021,  prostate cancer completed chemotherapy.  He presents for annual visit, presently completely asymptomatic.  He has not had any bleeding diathesis.  He remains active.  He is being closely monitored by urology.  Blood pressure is well controlled.   Previously lipids are also well controlled but he needs annual labs.  I do not see recent serum creatinine but from remote evaluation, it appears that his renal function is completely normalized from stage III chronic kidney disease.  Pacemaker is functioning normally.  QT interval is remained normal.  No changes in the medications were done today, I will see him back on an annual basis.  She will continue remote pacemaker transmissions, pacemaker is functioning normally.     [name] Prows, MD, [facility] 11/06/2021, 10:16 AM Office: [phone]

## 2021-11-07 LAB — HGB A1C W/O EAG: Hgb A1c MFr Bld: 6.4 % — ABNORMAL HIGH (ref 4.8–5.6)

## 2021-11-07 LAB — CBC
Hematocrit: 40.2 % (ref 37.5–51.0)
Hemoglobin: 13.5 g/dL (ref 13.0–17.7)
MCH: 31.4 pg (ref 26.6–33.0)
MCHC: 33.6 g/dL (ref 31.5–35.7)
MCV: 94 fL (ref 79–97)
Platelets: 207 10*3/uL (ref 150–450)
RBC: 4.3 x10E6/uL (ref 4.14–5.80)
RDW: 13.6 % (ref 11.6–15.4)
WBC: 8.5 10*3/uL (ref 3.4–10.8)

## 2021-11-07 LAB — LIPID PANEL WITH LDL/HDL RATIO
Cholesterol, Total: 161 mg/dL (ref 100–199)
HDL: 81 mg/dL (ref >39)
LDL Chol Calc (NIH): 64 mg/dL (ref 0–99)
LDL/HDL Ratio: 0.8 ratio (ref 0.0–3.6)
Triglycerides: 88 mg/dL (ref 0–149)
VLDL Cholesterol Cal: 16 mg/dL (ref 5–40)

## 2021-11-07 LAB — CMP14+EGFR
ALT: 14 IU/L (ref 0–44)
AST: 14 IU/L (ref 0–40)
Albumin/Globulin Ratio: 1.6 (ref 1.2–2.2)
Albumin: 4.1 g/dL (ref 3.6–4.6)
Alkaline Phosphatase: 50 IU/L (ref 44–121)
BUN/Creatinine Ratio: 14 (ref 10–24)
BUN: 17 mg/dL (ref 8–27)
Bilirubin Total: 0.7 mg/dL (ref 0.0–1.2)
CO2: 24 mmol/L (ref 20–29)
Calcium: 9.2 mg/dL (ref 8.6–10.2)
Chloride: 107 mmol/L — ABNORMAL HIGH (ref 96–106)
Creatinine, Ser: 1.23 mg/dL (ref 0.76–1.27)
Globulin, Total: 2.5 g/dL (ref 1.5–4.5)
Glucose: 106 mg/dL — ABNORMAL HIGH (ref 70–99)
Potassium: 3.9 mmol/L (ref 3.5–5.2)
Sodium: 145 mmol/L — ABNORMAL HIGH (ref 134–144)
Total Protein: 6.6 g/dL (ref 6.0–8.5)
eGFR: 58 mL/min/{1.73_m2} — ABNORMAL LOW (ref >59)

## 2021-11-07 NOTE — Progress Notes (Signed)
Let him know the labs including mild prediabetes mild kidney disease are very stable and cholesterol and blood counts are normal. I have forwarded these to his PCP

## 2021-11-08 NOTE — Progress Notes (Signed)
Called and spoke with patient regarding his lab results.

## 2021-11-14 DIAGNOSIS — C7951 Secondary malignant neoplasm of bone: Secondary | ICD-10-CM | POA: Diagnosis not present

## 2021-11-14 DIAGNOSIS — C61 Malignant neoplasm of prostate: Secondary | ICD-10-CM | POA: Diagnosis not present

## 2021-11-14 DIAGNOSIS — R351 Nocturia: Secondary | ICD-10-CM | POA: Diagnosis not present

## 2021-12-04 DIAGNOSIS — C7951 Secondary malignant neoplasm of bone: Secondary | ICD-10-CM | POA: Diagnosis not present

## 2021-12-04 DIAGNOSIS — C61 Malignant neoplasm of prostate: Secondary | ICD-10-CM | POA: Diagnosis not present

## 2021-12-11 ENCOUNTER — Ambulatory Visit: Payer: Medicare HMO | Admitting: Podiatry

## 2022-01-01 DIAGNOSIS — C61 Malignant neoplasm of prostate: Secondary | ICD-10-CM | POA: Diagnosis not present

## 2022-01-01 DIAGNOSIS — C7951 Secondary malignant neoplasm of bone: Secondary | ICD-10-CM | POA: Diagnosis not present

## 2022-01-29 DIAGNOSIS — C7951 Secondary malignant neoplasm of bone: Secondary | ICD-10-CM | POA: Diagnosis not present

## 2022-01-29 DIAGNOSIS — C61 Malignant neoplasm of prostate: Secondary | ICD-10-CM | POA: Diagnosis not present

## 2022-02-13 DIAGNOSIS — C61 Malignant neoplasm of prostate: Secondary | ICD-10-CM | POA: Diagnosis not present

## 2022-02-16 DIAGNOSIS — I495 Sick sinus syndrome: Secondary | ICD-10-CM | POA: Diagnosis not present

## 2022-02-16 DIAGNOSIS — Z45018 Encounter for adjustment and management of other part of cardiac pacemaker: Secondary | ICD-10-CM | POA: Diagnosis not present

## 2022-02-19 DIAGNOSIS — I10 Essential (primary) hypertension: Secondary | ICD-10-CM | POA: Diagnosis not present

## 2022-02-19 DIAGNOSIS — C61 Malignant neoplasm of prostate: Secondary | ICD-10-CM | POA: Diagnosis not present

## 2022-02-19 DIAGNOSIS — Z0000 Encounter for general adult medical examination without abnormal findings: Secondary | ICD-10-CM | POA: Diagnosis not present

## 2022-02-19 DIAGNOSIS — E7849 Other hyperlipidemia: Secondary | ICD-10-CM | POA: Diagnosis not present

## 2022-02-19 DIAGNOSIS — I4891 Unspecified atrial fibrillation: Secondary | ICD-10-CM | POA: Diagnosis not present

## 2022-02-19 DIAGNOSIS — R7303 Prediabetes: Secondary | ICD-10-CM | POA: Diagnosis not present

## 2022-02-20 DIAGNOSIS — R351 Nocturia: Secondary | ICD-10-CM | POA: Diagnosis not present

## 2022-02-20 DIAGNOSIS — C61 Malignant neoplasm of prostate: Secondary | ICD-10-CM | POA: Diagnosis not present

## 2022-02-20 DIAGNOSIS — C7951 Secondary malignant neoplasm of bone: Secondary | ICD-10-CM | POA: Diagnosis not present

## 2022-03-09 ENCOUNTER — Other Ambulatory Visit: Payer: Self-pay | Admitting: Cardiology

## 2022-03-09 DIAGNOSIS — I10 Essential (primary) hypertension: Secondary | ICD-10-CM

## 2022-03-12 DIAGNOSIS — C61 Malignant neoplasm of prostate: Secondary | ICD-10-CM | POA: Diagnosis not present

## 2022-03-12 DIAGNOSIS — C7951 Secondary malignant neoplasm of bone: Secondary | ICD-10-CM | POA: Diagnosis not present

## 2022-03-18 NOTE — Progress Notes (Unsigned)
Chief Complaint  Patient presents with   Pacemaker Check   Encounter for care of pacemaker  Pacemaker-St.Jude Accent DR-RF dual chamber pacemaker   Sick sinus syndrome ([place])  Scheduled  In office pacemaker check 03/18/22  Single (S)/Dual (D)/BV: ***. Presenting ***. Pacemaker dependant:  ***. Underlying ***. AP ***%, VP ***%. BP ***%. AMS Episodes ***.  AT/AF burden ***% . Longest ***. Latest ***. HVR ***. Longest ***. Latest ***. Longevity *** Years. Magnet rate: >85%. Lead measurements: Stable. Thoracic impedance: ***. Histogram: Low (L)/normal (N)/high (H)  ***. Patient activity ***.   Observations: ***. Changes: ***.

## 2022-03-19 ENCOUNTER — Ambulatory Visit: Payer: Medicare HMO | Admitting: Cardiology

## 2022-03-19 DIAGNOSIS — Z45018 Encounter for adjustment and management of other part of cardiac pacemaker: Secondary | ICD-10-CM

## 2022-03-19 DIAGNOSIS — Z950 Presence of cardiac pacemaker: Secondary | ICD-10-CM

## 2022-03-19 DIAGNOSIS — I495 Sick sinus syndrome: Secondary | ICD-10-CM | POA: Diagnosis not present

## 2022-03-27 ENCOUNTER — Other Ambulatory Visit: Payer: Self-pay | Admitting: Cardiology

## 2022-04-09 DIAGNOSIS — C61 Malignant neoplasm of prostate: Secondary | ICD-10-CM | POA: Diagnosis not present

## 2022-04-09 DIAGNOSIS — C7951 Secondary malignant neoplasm of bone: Secondary | ICD-10-CM | POA: Diagnosis not present

## 2022-05-03 ENCOUNTER — Other Ambulatory Visit: Payer: Self-pay | Admitting: Cardiology

## 2022-05-07 DIAGNOSIS — C61 Malignant neoplasm of prostate: Secondary | ICD-10-CM | POA: Diagnosis not present

## 2022-05-07 DIAGNOSIS — C7951 Secondary malignant neoplasm of bone: Secondary | ICD-10-CM | POA: Diagnosis not present

## 2022-05-15 DIAGNOSIS — C7951 Secondary malignant neoplasm of bone: Secondary | ICD-10-CM | POA: Diagnosis not present

## 2022-05-15 DIAGNOSIS — C61 Malignant neoplasm of prostate: Secondary | ICD-10-CM | POA: Diagnosis not present

## 2022-05-25 DIAGNOSIS — Z950 Presence of cardiac pacemaker: Secondary | ICD-10-CM | POA: Diagnosis not present

## 2022-05-25 DIAGNOSIS — I495 Sick sinus syndrome: Secondary | ICD-10-CM | POA: Diagnosis not present

## 2022-06-11 DIAGNOSIS — C7951 Secondary malignant neoplasm of bone: Secondary | ICD-10-CM | POA: Diagnosis not present

## 2022-06-11 DIAGNOSIS — C61 Malignant neoplasm of prostate: Secondary | ICD-10-CM | POA: Diagnosis not present

## 2022-07-09 DIAGNOSIS — C7951 Secondary malignant neoplasm of bone: Secondary | ICD-10-CM | POA: Diagnosis not present

## 2022-07-09 DIAGNOSIS — C61 Malignant neoplasm of prostate: Secondary | ICD-10-CM | POA: Diagnosis not present

## 2022-08-25 ENCOUNTER — Other Ambulatory Visit: Payer: Self-pay | Admitting: Cardiology

## 2022-11-07 ENCOUNTER — Ambulatory Visit: Payer: Medicare HMO | Admitting: Cardiology

## 2022-11-12 ENCOUNTER — Ambulatory Visit: Payer: Medicare HMO | Admitting: Cardiology

## 2022-11-12 ENCOUNTER — Encounter: Payer: Self-pay | Admitting: Cardiology

## 2022-11-12 VITALS — BP 134/79 | HR 76 | Resp 16 | Ht 66.0 in | Wt 167.6 lb

## 2022-11-12 DIAGNOSIS — I495 Sick sinus syndrome: Secondary | ICD-10-CM

## 2022-11-12 DIAGNOSIS — Z950 Presence of cardiac pacemaker: Secondary | ICD-10-CM

## 2022-11-12 DIAGNOSIS — I10 Essential (primary) hypertension: Secondary | ICD-10-CM

## 2022-11-12 DIAGNOSIS — I4892 Unspecified atrial flutter: Secondary | ICD-10-CM

## 2022-11-12 NOTE — Progress Notes (Signed)
Primary Physician/Referring:  Fleet Contras, MD  Patient ID: [name] [name], male    DOB: [birth year], 87 y.o.   MRN: [id number]  Chief Complaint  Patient presents with   Atrial Fibrillation   Follow-up   Hypertension   HPI:    [name] [name]  is a 87 y.o. African-American male with history of paroxysmal atrial flutter status post ablation but had recurrence of atrial flutter and also has had paroxysmal atrial fibrillation hence has been on anticoagulation, sick sinus syndrome S/P pacemaker implantation in 2013, underwent generator change on 03/18/2021,  prostate cancer presently on chemotherapy.  He is tolerating Eliquis without bleeding complications.  Feels well and essentially asymptomatic.  He is here for management of hypertension, atrial fibrillation. Denies chest pain or palpitations.   He prefers Rec[employer].  Past Medical History:  Diagnosis Date   AKI (acute kidney injury) (HCC) 06/08/2018   Atrial flutter (HCC)    Carcinoma of prostate (HCC) 12/30/2011   Treated with seed/radiation therapy.    Colostomy in place [facility])    Difficulty sleeping    lives in shelter currently   Diverticulitis    Dyslipidemia 12/30/2011   Encounter for care of pacemaker 06/17/2018   Frequency of urination    Heart block AV complete (HCC) 11/06/2021   Heart murmur    Hypertension 04/30/11   Cardioversion 06/16/11   Near syncope 06/07/2018   Nocturia    Orthostatic hypotension 06/08/2018   Pacemaker 7/13    sick sinus syndrome/St Jude pacemaker   Paroxysmal atrial flutter (HCC) 06/16/2011   Prostate cancer (HCC)    Sick sinus syndrome (HCC) 11/28/2011   Syncope and collapse 11/20/2011   Atrial and ventricular standstill > 3 seconds.    Thyroid disease    had "overactive thyroid in 1997" - no known problem since   Past Surgical History:  Procedure Laterality Date   ATRIAL FLUTTER ABLATION N/A 11/21/2011   Procedure: ATRIAL FLUTTER ABLATION;  Surgeon: Hillis Range, MD;  [facility] CATH LAB;   Service: Cardiovascular;  Laterality: N/A;   CARDIAC ELECTROPHYSIOLOGY STUDY AND ABLATION  7/13   CARDIOVERSION  06/16/2011   Procedure: CARDIOVERSION;  Surgeon: Pamella Pert, MD;  Location: [facility] OR;  Service: Cardiovascular;  Laterality: N/A;   COLON SURGERY  05/13/2013   COLOSTOMY CLOSURE N/A 11/25/2013   Procedure: COLOSTOMY CLOSURE;  Surgeon: Velora Heckler, MD;  Location: [facility] ORS;  Service: General;  Laterality: N/A;   COLOSTOMY CLOSURE  11/25/2013   PACEMAKER INSERTION  11/28/11   SJM Accent DR RF implanted by Dr Johney Frame   PARTIAL COLECTOMY N/A 05/13/2013   Procedure: sigmoid COLECTOMY  colostomy ;  Surgeon: Velora Heckler, MD;  Location: [facility] ORS;  Service: General;  Laterality: N/A;   PERMANENT PACEMAKER INSERTION N/A 11/28/2011   Procedure: PERMANENT PACEMAKER INSERTION;  Surgeon: Hillis Range, MD;  Location: [facility] CATH LAB;  Service: Cardiovascular;  Laterality: N/A;   PPM GENERATOR CHANGEOUT N/A 03/18/2021   Procedure: PPM GENERATOR CHANGEOUT;  Surgeon: Marinus Maw, MD;  Location: [facility] INVASIVE CV LAB;  Service: Cardiovascular;  Laterality: N/A;   RADIOACTIVE SEED IMPLANT     TONSILLECTOMY     Social History   Tobacco Use   Smoking status: Former    Packs/day: 0.50    Years: 50.00    Additional pack years: 0.00    Total pack years: 25.00    Types: Cigarettes    Quit date: 07/20/2009    Years since quitting: 13.3   Smokeless  tobacco: Never  Substance Use Topics   Alcohol use: No   Marital Status: Single  ROS  Review of Systems  Cardiovascular:  Negative for chest pain, dyspnea on exertion and leg swelling.  Gastrointestinal:  Negative for melena.   Objective  Blood pressure 134/79, pulse 76, resp. rate 16, height 5\' 6"  (1.676 m), weight 167 lb 9.6 oz (76 kg), SpO2 98 %.     11/12/2022   10:28 AM 11/06/2021    9:45 AM 05/09/2021    9:50 AM  Vitals with BMI  Height 5\' 6"  5\' 6"    Weight 167 lbs 10 oz 165 lbs   BMI 27.06 26.64   Systolic 134 127 518  Diastolic 79 67 78  Pulse  76 70 47     Physical Exam Neck:     Vascular: No JVD.  Cardiovascular:     Rate and Rhythm: Normal rate and regular rhythm.     Pulses: Intact distal pulses.     Heart sounds: Normal heart sounds. No murmur heard.    No gallop.  Pulmonary:     Effort: Pulmonary effort is normal.     Breath sounds: Normal breath sounds.  Abdominal:     General: Bowel sounds are normal.     Palpations: Abdomen is soft.  Musculoskeletal:     Right lower leg: No edema.     Left lower leg: No edema.    Laboratory examination:   Lab Results  Component Value Date   NA 145 (H) 11/06/2021   K 3.9 11/06/2021   CO2 24 11/06/2021   GLUCOSE 106 (H) 11/06/2021   BUN 17 11/06/2021   CREATININE 1.23 11/06/2021   CALCIUM 9.2 11/06/2021   EGFR 58 (L) 11/06/2021   GFRNONAA 41 (L) 12/29/2019       Latest Ref Rng & Units 11/06/2021   10:42 AM 03/13/2021    9:33 AM 01/17/2021    9:54 AM  CMP  Glucose 70 - 99 mg/dL 841  95  660   BUN 8 - 27 mg/dL 17  22  15    Creatinine 0.76 - 1.27 mg/dL 6.30  1.60  1.09   Sodium 134 - 144 mmol/L 145  142  144   Potassium 3.5 - 5.2 mmol/L 3.9  4.2  3.8   Chloride 96 - 106 mmol/L 107  110  104   CO2 20 - 29 mmol/L 24  24  23    Calcium 8.6 - 10.2 mg/dL 9.2  8.3  9.2   Total Protein 6.0 - 8.5 g/dL 6.6   6.7   Total Bilirubin 0.0 - 1.2 mg/dL 0.7   0.7   Alkaline Phos 44 - 121 IU/L 50   85   AST 0 - 40 IU/L 14   17   ALT 0 - 44 IU/L 14   10       Latest Ref Rng & Units 11/06/2021   10:42 AM 03/13/2021    9:33 AM 01/17/2021    9:54 AM  CBC  WBC 3.4 - 10.8 x10E3/uL 8.5  9.7  5.6   Hemoglobin 13.0 - 17.7 g/dL 32.3  55.7  32.2   Hematocrit 37.5 - 51.0 % 40.2  36.8  38.6   Platelets 150 - 450 x10E3/uL 207  207  217    Lipid Panel     Component Value Date/Time   CHOL 161 11/06/2021 1042   TRIG 88 11/06/2021 1042   HDL 81 11/06/2021 1042   LDLCALC 64 11/06/2021 1042  HEMOGLOBIN A1C Lab Results  Component Value Date   HGBA1C 6.4 (H) 11/06/2021   MPG 139.85  03/01/2019   TSH Lab Results  Component Value Date   TSH 0.824 01/17/2021    External labs:  Creatinine, Serum 0.900 mg/ 08/01/2021 Potassium 4.200 mEq 08/01/2021 ALT (SGPT) 11.000 IU/ 08/01/2021  Medications and allergies   Allergies  Allergen Reactions   Xarelto [Rivaroxaban] Other (See Comments)    "I didn't like the way it made me feel" (patient didn't elaborate)     Current Outpatient Medications:    abiraterone acetate (ZYTIGA) 250 MG tablet, Take 1,000 mg by mouth daily., Disp: , Rfl:    apixaban (ELIQUIS) 2.5 MG TABS tablet, Take 2.5 mg by mouth 2 (two) times daily., Disp: , Rfl:    atorvastatin (LIPITOR) 20 MG tablet, TAKE 1 TABLET EVERY DAY, Disp: 90 tablet, Rfl: 3   denosumab (XGEVA) 120 MG/1.7ML SOLN injection, Inject 120 mg into the skin every 30 (thirty) days., Disp: , Rfl:    hydrALAZINE (APRESOLINE) 50 MG tablet, TAKE 1 TABLET THREE TIMES DAILY, Disp: 270 tablet, Rfl: 10   metoprolol tartrate (LOPRESSOR) 50 MG tablet, TAKE 1 TABLET TWICE DAILY, Disp: 180 tablet, Rfl: 3   olmesartan-hydrochlorothiazide (BENICAR HCT) 40-25 MG tablet, Take 1 tablet by mouth daily., Disp: 30 tablet, Rfl: 3   tamsulosin (FLOMAX) 0.4 MG CAPS capsule, TAKE 1 CAPSULE AT BEDTIME, Disp: 90 capsule, Rfl: 3    Radiology:  No results found.  Cardiac Studies:   Echocardiogram 06/08/2018: 1. The left ventricle has hyperdynamic systolic function of >65%. The cavity size is normal. There is no increas ed left ventricular wall thickness. Echo evidence of normal diastolic filling patterns. Normal left ventricular filling pressures. 2. The aortic valve is tricuspid in structure. There is mild thickening and mild calcification of the aortic valve.   Pacemaker-St.Jude Accent DR-RF dual chamber pacemaker 2013:   Remote dual-chamber pacemaker transmission 08/24/2022: Longevity 8 years and 6 months.  AP 85 %, VP 4.5 %. Lead impedance and thresholds within normal limits.  There were brief mode switches,  AT/AF burden <1%.  Normal pacemaker function.  EKG:  EKG 11/12/2022: Atrially paced, ventricularly sensed rhythm at the rate of 63 bpm.  Otherwise normal EKG.  Compared to 11/06/2021, frequent PACs in bigeminal pattern not present.   Assessment     ICD-10-CM   1. Paroxysmal atrial flutter (HCC)  I48.92 EKG 12-Lead    2. Sick sinus syndrome (HCC)  I49.5     3. Essential hypertension, benign  I10     4. Pacemaker-St.Jude Accent DR-RF dual chamber pacemaker   Z95.0       CHA2DS2-VASc Score is 3.  Yearly risk of stroke: 3.2% (A, HTN).  Score of 1=0.6; 2=2.2; 3=3.2; 4=4.8; 5=7.2; 6=9.8; 7=>9.8) -(CHF; HTN; vasc disease DM,  Male = 1; Age <65 =0; 65-74 = 1,  >75 =2; stroke/embolism= 2).   No orders of the defined types were placed in this encounter.    Medications Discontinued During This Encounter  Medication Reason   ondansetron (ZOFRAN) 4 MG tablet    predniSONE (DELTASONE) 5 MG tablet    ELIQUIS 2.5 MG TABS tablet Change in therapy   dabigatran (PRADAXA) 150 MG CAPS capsule Entry Error     Recommendations:   [name] [name]  is a 87 y.o. African-American male with history of paroxysmal atrial flutter status post ablation but had recurrence of atrial flutter and also has had paroxysmal atrial fibrillation hence has been on anticoagulation,  sick sinus syndrome S/P pacemaker implantation in 2013, underwent generator change on 03/18/2021, presently on chemotherapy for prostate cancer and tolerating well without side effects.  PSA has remained stable.  1. Paroxysmal atrial flutter (HCC) Patient is maintaining sinus rhythm and are atrially paced rhythm.  He is presently on Eliquis, renal function has remained stable, continue the same.  He has an appointment to see Dr. Concepcion Elk in October, 2024 for complete physical, I will obtain labs from him.  In view of CHA2DS2-VASc score of 3, he is presently on Eliquis and is tolerating this well. - EKG 12-Lead  2. Sick sinus syndrome [facility]) Patient is  presently A-paced about 85 to 90% of the time.  Pacemaker is functioning normally.  3. Essential hypertension, benign Blood pressure under excellent control.  Presently on olmesartan HCT both for chronic renal insufficiency and for hypertension and labs are remained stable.  4. Pacemaker-St.Jude Accent DR-RF dual chamber pacemaker  Pacemaker is functioning normally.  I will see him back on annual basis.  Other orders - denosumab (XGEVA) 120 MG/1.7ML SOLN injection; Inject 120 mg into the skin every 30 (thirty) days.      Yates Decamp, MD, [facility] 11/12/2022, 11:00 AM Office: [phone]

## 2022-11-13 ENCOUNTER — Ambulatory Visit: Payer: Medicare HMO | Admitting: Cardiology

## 2022-12-17 ENCOUNTER — Other Ambulatory Visit (HOSPITAL_COMMUNITY): Payer: Self-pay | Admitting: Urology

## 2022-12-17 DIAGNOSIS — C7951 Secondary malignant neoplasm of bone: Secondary | ICD-10-CM

## 2022-12-17 DIAGNOSIS — C61 Malignant neoplasm of prostate: Secondary | ICD-10-CM

## 2023-01-12 ENCOUNTER — Other Ambulatory Visit: Payer: Self-pay | Admitting: Cardiology

## 2023-03-05 ENCOUNTER — Encounter (HOSPITAL_COMMUNITY)
Admission: RE | Admit: 2023-03-05 | Discharge: 2023-03-05 | Disposition: A | Discharge status: Home / Self Care | Payer: Medicare HMO | Source: Ambulatory Visit | Attending: Urology | Admitting: Urology

## 2023-03-05 ENCOUNTER — Other Ambulatory Visit (HOSPITAL_COMMUNITY): Payer: Medicare HMO

## 2023-03-05 DIAGNOSIS — C61 Malignant neoplasm of prostate: Secondary | ICD-10-CM | POA: Insufficient documentation

## 2023-03-05 DIAGNOSIS — C7951 Secondary malignant neoplasm of bone: Secondary | ICD-10-CM | POA: Diagnosis present

## 2023-03-05 DIAGNOSIS — C7952 Secondary malignant neoplasm of bone marrow: Secondary | ICD-10-CM | POA: Diagnosis present

## 2023-03-05 MED ORDER — TECHNETIUM TC 99M MEDRONATE IV KIT
20.0000 | PACK | Freq: Once | INTRAVENOUS | Status: AC | PRN
  Administered 2023-03-05: 21 via INTRAVENOUS

## 2023-03-11 ENCOUNTER — Other Ambulatory Visit: Payer: Self-pay | Admitting: Cardiology

## 2023-03-11 DIAGNOSIS — I10 Essential (primary) hypertension: Secondary | ICD-10-CM

## 2023-03-14 ENCOUNTER — Other Ambulatory Visit: Payer: Self-pay | Admitting: Cardiology

## 2023-03-16 ENCOUNTER — Ambulatory Visit (INDEPENDENT_AMBULATORY_CARE_PROVIDER_SITE_OTHER): Payer: Medicare HMO

## 2023-03-16 DIAGNOSIS — I495 Sick sinus syndrome: Secondary | ICD-10-CM | POA: Diagnosis not present

## 2023-03-18 LAB — CUP PACEART REMOTE DEVICE CHECK
Battery Remaining Longevity: 95 mo
Battery Remaining Percentage: 84 %
Battery Voltage: 3.01 V
Brady Statistic AP VP Percent: 6.1 %
Brady Statistic AP VS Percent: 68 %
Brady Statistic AS VP Percent: 2.2 %
Brady Statistic AS VS Percent: 23 %
Brady Statistic RA Percent Paced: 67 %
Brady Statistic RV Percent Paced: 8.5 %
Date Time Interrogation Session: 20241111020013
Implantable Lead Connection Status: 753985
Implantable Lead Connection Status: 753985
Implantable Lead Implant Date: 20130726
Implantable Lead Implant Date: 20130726
Implantable Lead Location: 753859
Implantable Lead Location: 753860
Implantable Lead Model: 1948
Implantable Pulse Generator Implant Date: 20221114
Lead Channel Impedance Value: 330 Ohm
Lead Channel Impedance Value: 490 Ohm
Lead Channel Pacing Threshold Amplitude: 0.75 V
Lead Channel Pacing Threshold Amplitude: 1 V
Lead Channel Pacing Threshold Pulse Width: 0.5 ms
Lead Channel Pacing Threshold Pulse Width: 0.5 ms
Lead Channel Sensing Intrinsic Amplitude: 3.3 mV
Lead Channel Sensing Intrinsic Amplitude: 7.7 mV
Lead Channel Setting Pacing Amplitude: 1.25 V
Lead Channel Setting Pacing Amplitude: 1.75 V
Lead Channel Setting Pacing Pulse Width: 0.5 ms
Lead Channel Setting Sensing Sensitivity: 2 mV
Pulse Gen Model: 2272
Pulse Gen Serial Number: 3975489

## 2023-04-08 NOTE — Progress Notes (Signed)
Remote pacemaker transmission.   

## 2023-06-15 ENCOUNTER — Ambulatory Visit (INDEPENDENT_AMBULATORY_CARE_PROVIDER_SITE_OTHER): Payer: Medicare HMO

## 2023-06-15 DIAGNOSIS — I495 Sick sinus syndrome: Secondary | ICD-10-CM | POA: Diagnosis not present

## 2023-06-16 LAB — CUP PACEART REMOTE DEVICE CHECK
Battery Remaining Longevity: 91 mo
Battery Remaining Percentage: 81 %
Battery Voltage: 3.01 V
Brady Statistic AP VP Percent: 3.5 %
Brady Statistic AP VS Percent: 82 %
Brady Statistic AS VP Percent: 1.4 %
Brady Statistic AS VS Percent: 13 %
Brady Statistic RA Percent Paced: 80 %
Brady Statistic RV Percent Paced: 5 %
Date Time Interrogation Session: 20250210103223
Implantable Lead Connection Status: 753985
Implantable Lead Connection Status: 753985
Implantable Lead Implant Date: 20130726
Implantable Lead Implant Date: 20130726
Implantable Lead Location: 753859
Implantable Lead Location: 753860
Implantable Lead Model: 1948
Implantable Pulse Generator Implant Date: 20221114
Lead Channel Impedance Value: 300 Ohm
Lead Channel Impedance Value: 490 Ohm
Lead Channel Pacing Threshold Amplitude: 0.75 V
Lead Channel Pacing Threshold Amplitude: 1.125 V
Lead Channel Pacing Threshold Pulse Width: 0.5 ms
Lead Channel Pacing Threshold Pulse Width: 0.5 ms
Lead Channel Sensing Intrinsic Amplitude: 10.8 mV
Lead Channel Sensing Intrinsic Amplitude: 2.6 mV
Lead Channel Setting Pacing Amplitude: 1.375
Lead Channel Setting Pacing Amplitude: 1.75 V
Lead Channel Setting Pacing Pulse Width: 0.5 ms
Lead Channel Setting Sensing Sensitivity: 2 mV
Pulse Gen Model: 2272
Pulse Gen Serial Number: 3975489

## 2023-07-20 ENCOUNTER — Other Ambulatory Visit: Payer: Self-pay | Admitting: Cardiology

## 2023-07-20 NOTE — Progress Notes (Signed)
 Remote pacemaker transmission.

## 2023-07-20 NOTE — Addendum Note (Signed)
 Addended by: Geralyn Flash D on: 07/20/2023 09:39 AM   Modules accepted: Orders

## 2023-07-20 NOTE — Telephone Encounter (Signed)
 Prescription refill request for Eliquis received. Indication:aflutter Last office visit:7/24 Scr:1.2  2/25 Age: 88 Weight:76  kg  Prescription refilled

## 2023-09-14 ENCOUNTER — Ambulatory Visit (INDEPENDENT_AMBULATORY_CARE_PROVIDER_SITE_OTHER): Payer: Medicare HMO

## 2023-09-14 DIAGNOSIS — I495 Sick sinus syndrome: Secondary | ICD-10-CM

## 2023-09-15 LAB — CUP PACEART REMOTE DEVICE CHECK
Battery Remaining Longevity: 89 mo
Battery Remaining Percentage: 79 %
Battery Voltage: 3.01 V
Brady Statistic AP VP Percent: 2.7 %
Brady Statistic AP VS Percent: 85 %
Brady Statistic AS VP Percent: 1.2 %
Brady Statistic AS VS Percent: 11 %
Brady Statistic RA Percent Paced: 83 %
Brady Statistic RV Percent Paced: 4.1 %
Date Time Interrogation Session: 20250512181216
Implantable Lead Connection Status: 753985
Implantable Lead Connection Status: 753985
Implantable Lead Implant Date: 20130726
Implantable Lead Implant Date: 20130726
Implantable Lead Location: 753859
Implantable Lead Location: 753860
Implantable Lead Model: 1948
Implantable Pulse Generator Implant Date: 20221114
Lead Channel Impedance Value: 330 Ohm
Lead Channel Impedance Value: 510 Ohm
Lead Channel Pacing Threshold Amplitude: 0.625 V
Lead Channel Pacing Threshold Amplitude: 1.125 V
Lead Channel Pacing Threshold Pulse Width: 0.5 ms
Lead Channel Pacing Threshold Pulse Width: 0.5 ms
Lead Channel Sensing Intrinsic Amplitude: 3.1 mV
Lead Channel Sensing Intrinsic Amplitude: 9.5 mV
Lead Channel Setting Pacing Amplitude: 1.375
Lead Channel Setting Pacing Amplitude: 1.625
Lead Channel Setting Pacing Pulse Width: 0.5 ms
Lead Channel Setting Sensing Sensitivity: 2 mV
Pulse Gen Model: 2272
Pulse Gen Serial Number: 3975489

## 2023-09-18 ENCOUNTER — Ambulatory Visit: Payer: Self-pay | Admitting: Cardiovascular Disease

## 2023-10-17 ENCOUNTER — Other Ambulatory Visit: Payer: Self-pay | Admitting: Cardiology

## 2023-10-17 DIAGNOSIS — I10 Essential (primary) hypertension: Secondary | ICD-10-CM

## 2023-10-28 NOTE — Progress Notes (Signed)
 Remote pacemaker transmission.

## 2023-10-28 NOTE — Addendum Note (Signed)
 Addended by: TAWNI DRILLING D on: 10/28/2023 09:19 AM   Modules accepted: Orders

## 2023-10-31 ENCOUNTER — Other Ambulatory Visit: Payer: Self-pay | Admitting: Cardiology

## 2023-11-12 ENCOUNTER — Ambulatory Visit: Payer: Medicare HMO | Admitting: Cardiology

## 2023-11-25 ENCOUNTER — Other Ambulatory Visit: Payer: Self-pay | Admitting: Cardiology

## 2023-12-14 ENCOUNTER — Ambulatory Visit: Payer: Medicare HMO

## 2023-12-14 DIAGNOSIS — I495 Sick sinus syndrome: Secondary | ICD-10-CM

## 2023-12-15 ENCOUNTER — Ambulatory Visit: Payer: Self-pay | Admitting: Cardiovascular Disease

## 2023-12-15 LAB — CUP PACEART REMOTE DEVICE CHECK
Battery Remaining Longevity: 86 mo
Battery Remaining Percentage: 76 %
Battery Voltage: 3.01 V
Brady Statistic AP VP Percent: 2.4 %
Brady Statistic AP VS Percent: 86 %
Brady Statistic AS VP Percent: 1.3 %
Brady Statistic AS VS Percent: 9.9 %
Brady Statistic RA Percent Paced: 85 %
Brady Statistic RV Percent Paced: 3.7 %
Date Time Interrogation Session: 20250811105531
Implantable Lead Connection Status: 753985
Implantable Lead Connection Status: 753985
Implantable Lead Implant Date: 20130726
Implantable Lead Implant Date: 20130726
Implantable Lead Location: 753859
Implantable Lead Location: 753860
Implantable Lead Model: 1948
Implantable Pulse Generator Implant Date: 20221114
Lead Channel Impedance Value: 330 Ohm
Lead Channel Impedance Value: 490 Ohm
Lead Channel Pacing Threshold Amplitude: 0.75 V
Lead Channel Pacing Threshold Amplitude: 1 V
Lead Channel Pacing Threshold Pulse Width: 0.5 ms
Lead Channel Pacing Threshold Pulse Width: 0.5 ms
Lead Channel Sensing Intrinsic Amplitude: 2.9 mV
Lead Channel Sensing Intrinsic Amplitude: 7.5 mV
Lead Channel Setting Pacing Amplitude: 1.25 V
Lead Channel Setting Pacing Amplitude: 1.75 V
Lead Channel Setting Pacing Pulse Width: 0.5 ms
Lead Channel Setting Sensing Sensitivity: 2 mV
Pulse Gen Model: 2272
Pulse Gen Serial Number: 3975489

## 2023-12-24 LAB — LAB REPORT - SCANNED: EGFR: 70

## 2023-12-31 ENCOUNTER — Other Ambulatory Visit: Payer: Self-pay | Admitting: Cardiology

## 2023-12-31 DIAGNOSIS — I10 Essential (primary) hypertension: Secondary | ICD-10-CM

## 2024-01-05 ENCOUNTER — Encounter: Payer: Self-pay | Admitting: Cardiology

## 2024-01-05 ENCOUNTER — Ambulatory Visit: Attending: Cardiology | Admitting: Cardiology

## 2024-01-05 VITALS — BP 137/68 | HR 75 | Resp 16 | Ht 66.0 in | Wt 165.8 lb

## 2024-01-05 DIAGNOSIS — I10 Essential (primary) hypertension: Secondary | ICD-10-CM

## 2024-01-05 DIAGNOSIS — I4892 Unspecified atrial flutter: Secondary | ICD-10-CM

## 2024-01-05 DIAGNOSIS — I495 Sick sinus syndrome: Secondary | ICD-10-CM | POA: Diagnosis not present

## 2024-01-05 DIAGNOSIS — Z950 Presence of cardiac pacemaker: Secondary | ICD-10-CM | POA: Diagnosis not present

## 2024-01-05 NOTE — Patient Instructions (Signed)
 Medication Instructions:  No medication changes were made at this visit. Continue current regimen.   *If you need a refill on your cardiac medications before your next appointment, please call your pharmacy*  Lab Work: NONE If you have labs (blood work) drawn today and your tests are completely normal, you will receive your results only by: MyChart Message (if you have MyChart) OR A paper copy in the mail If you have any lab test that is abnormal or we need to change your treatment, we will call you to review the results.  Testing/Procedures: NONE  Follow-Up: At [facility], you and your health needs are our priority.  As part of our continuing mission to provide you with exceptional heart care, our providers are all part of one team.  This team includes your primary Cardiologist (physician) and Advanced Practice Providers or APPs (Physician Assistants and Nurse Practitioners) who all work together to provide you with the care you need, when you need it.  Your next appointment:   AS NEEDED   Provider:   Gordy Bergamo, MD    We recommend signing up for the patient portal called MyChart.  Sign up information is provided on this After Visit Summary.  MyChart is used to connect with patients for Virtual Visits (Telemedicine).  Patients are able to view lab/test results, encounter notes, upcoming appointments, etc.  Non-urgent messages can be sent to your provider as well.   To learn more about what you can do with MyChart, go to [email or web address].

## 2024-01-05 NOTE — Progress Notes (Signed)
 Cardiology Office Note:  .   Date:  01/05/2024  ID:  [name] [name], DOB [birth year], MRN [id number] PCP: Shelda Atlas, MD   HeartCare Providers Cardiologist:  Gordy Bergamo, MD   History of Present Illness: .   [name] [name] is a 88 y.o. African-American male with history of paroxysmal atrial flutter status post ablation but had recurrence of atrial flutter and also has had paroxysmal atrial fibrillation hence has been on anticoagulation, sick sinus syndrome S/P Saint Jude dual-chamber pacemaker implantation in 2013, underwent generator change on 03/18/2021,  prostate cancer completed chemotherapy.   He is tolerating Eliquis  without bleeding complications.  Feels well and essentially asymptomatic.   Cardiac Studies relevent.    Echocardiogram 06/08/2018: 1. The left ventricle has hyperdynamic systolic function of >65%. The cavity size is normal. There is no increas ed left ventricular wall thickness. Echo evidence of normal diastolic filling patterns. Normal left ventricular filling pressures. 2. The aortic valve is tricuspid in structure. There is mild thickening and mild calcification of the aortic valve     Discussed the use of AI scribe software for clinical note transcription with the patient, who gave verbal consent to proceed.  History of Present Illness [name] [name] is an 88 year old male with atrial fibrillation and hypertension who presents for cardiovascular follow-up. He was referred by Dr. Verdie for management of atrial fibrillation and hypertension.  He is on a medication regimen including hydralazine , metoprolol  tartrate, olmesartan  HCT, and amlodipine , which has improved his previously high blood pressure. He has a pacemaker for sinus node dysfunction and experiences occasional episodes of atrial fibrillation, managed with Eliquis  for stroke prevention. He receives his medications by mail through [facility].   Labs   Care everywhere/Faxed External Labs:  NA  ROS   Review of Systems  Cardiovascular:  Negative for chest pain, dyspnea on exertion and leg swelling.   Physical Exam:   VS:  BP 137/68 (BP Location: Left Arm, Patient Position: Sitting, Cuff Size: Normal)   Pulse 75   Resp 16   Ht 5' 6 (1.676 m)   Wt 165 lb 12.8 oz (75.2 kg)   SpO2 97%   BMI 26.76 kg/m    Wt Readings from Last 3 Encounters:  01/05/24 165 lb 12.8 oz (75.2 kg)  11/12/22 167 lb 9.6 oz (76 kg)  11/06/21 165 lb (74.8 kg)    BP Readings from Last 3 Encounters:  01/05/24 137/68  11/12/22 134/79  11/06/21 127/67   Physical Exam Neck:     Vascular: No carotid bruit or JVD.  Cardiovascular:     Rate and Rhythm: Normal rate and regular rhythm.     Pulses: Intact distal pulses.     Heart sounds: Normal heart sounds. No murmur heard.    No gallop.  Pulmonary:     Effort: Pulmonary effort is normal.     Breath sounds: Normal breath sounds.  Abdominal:     General: Bowel sounds are normal.     Palpations: Abdomen is soft.  Musculoskeletal:     Right lower leg: No edema.     Left lower leg: No edema.    EKG:    EKG Interpretation Date/Time:  Tuesday January 05 2024 11:25:23 EDT Ventricular Rate:  63 PR Interval:  218 QRS Duration:  92 QT Interval:  440 QTC Calculation: 450 R Axis:   -19  Text Interpretation: EKG 01/05/2024: Atrially paced, ventricular sensed rhythm at a rate of 63 bpm.  Compared to 04/24/2019, SVT, probably atrial flutter with  2: 1 conduction no longer present.  Compared to 11/12/2022, no change. Confirmed by Overton Boggus, Jagadeesh [phone]) on 01/05/2024 11:31:51 AM  EKG 11/12/2022: Atrially paced, ventricularly sensed rhythm at the rate of 63 bpm. Otherwise normal EKG.   Remote dual-chamber pacemaker transmission 12/15/2023: Remote pacemaker interrogation. Presenting Rhythm:A-paced V-sensed.  PAF noted. Battery and lead parameters stable with stable capture and sensing. Device programming is appropriate.   ASSESSMENT AND PLAN: .      ICD-10-CM    1. Paroxysmal atrial flutter (HCC)  I48.92 EKG 12-Lead    2. Sick sinus syndrome (HCC)  I49.5     3. Pacemaker-St.Jude Accent DR-RF dual chamber pacemaker   Z95.0     4. Essential hypertension, benign  I10      Assessment & Plan Sick sinus syndrome status post permanent pacemaker implantation Sick sinus syndrome with a permanent pacemaker in place. The pacemaker is functioning well, as evidenced by the latest transmission on 12/15/2023, which shows the top chamber is always paced. Battery life is good.  Management is stable. - Release to Dr. Gus Mealor for management of atrial fibrillation and pacemaker follow-up - Ensure pacemaker transmission is sent every three months  Paroxysmal atrial fibrillation and atrial flutter on anticoagulation Paroxysmal atrial fibrillation and atrial flutter are present, with occasional episodes of atrial fibrillation. Anticoagulation with Eliquis  is in place to prevent stroke. - Continue Eliquis  for anticoagulation - Follow up with Dr. Deirdre Furbish next week for atrial fibrillation management Click Here to Calculate/Change CHADS2VASc Score The patient's CHADS2-VASc score is 3, indicating a 3.2% annual risk of stroke.  Therefore, anticoagulation is recommended.   CHF History: No HTN History: Yes Diabetes History: No Stroke History: No Vascular Disease History: No   Essential hypertension, well controlled Essential hypertension is well controlled with current medication regimen. Recent addition of amlodipine  10 mg by Dr. Cooper has been effective in managing blood pressure. - Continue current antihypertensive regimen: hydralazine  50 mg three times a day, metoprolol  tartrate 50 mg twice a day, olmesartan  HCT 40/25 mg once a day, and amlodipine  10 mg once a day - Ensure medication list is updated and carry a copy to all doctor visits   Follow up: PRN with me  Signed,  Gordy Bergamo, MD, [facility] 01/05/2024, 12:40 PM [facility] [address], [place] [address] Phone: [phone]. Fax:  [phone]

## 2024-01-22 ENCOUNTER — Ambulatory Visit: Attending: Cardiovascular Disease | Admitting: Cardiovascular Disease

## 2024-01-22 ENCOUNTER — Encounter: Payer: Self-pay | Admitting: Cardiovascular Disease

## 2024-01-22 VITALS — BP 169/79 | HR 61 | Ht 66.0 in | Wt 164.0 lb

## 2024-01-22 DIAGNOSIS — I4892 Unspecified atrial flutter: Secondary | ICD-10-CM

## 2024-01-22 DIAGNOSIS — I495 Sick sinus syndrome: Secondary | ICD-10-CM | POA: Diagnosis not present

## 2024-01-22 DIAGNOSIS — I442 Atrioventricular block, complete: Secondary | ICD-10-CM

## 2024-01-22 LAB — CUP PACEART INCLINIC DEVICE CHECK
Battery Remaining Longevity: 87 mo
Battery Voltage: 2.99 V
Brady Statistic RA Percent Paced: 85 %
Brady Statistic RV Percent Paced: 3.9 %
Date Time Interrogation Session: 20250919130656
Implantable Lead Connection Status: 753985
Implantable Lead Connection Status: 753985
Implantable Lead Implant Date: 20130726
Implantable Lead Implant Date: 20130726
Implantable Lead Location: 753859
Implantable Lead Location: 753860
Implantable Lead Model: 1948
Implantable Pulse Generator Implant Date: 20221114
Lead Channel Impedance Value: 337.5 Ohm
Lead Channel Impedance Value: 337.5 Ohm
Lead Channel Impedance Value: 512.5 Ohm
Lead Channel Impedance Value: 512.5 Ohm
Lead Channel Pacing Threshold Amplitude: 0.75 V
Lead Channel Pacing Threshold Amplitude: 0.75 V
Lead Channel Pacing Threshold Amplitude: 1 V
Lead Channel Pacing Threshold Amplitude: 1 V
Lead Channel Pacing Threshold Pulse Width: 0.5 ms
Lead Channel Pacing Threshold Pulse Width: 0.5 ms
Lead Channel Pacing Threshold Pulse Width: 0.5 ms
Lead Channel Pacing Threshold Pulse Width: 0.5 ms
Lead Channel Sensing Intrinsic Amplitude: 10.9 mV
Lead Channel Sensing Intrinsic Amplitude: 10.9 mV
Lead Channel Sensing Intrinsic Amplitude: 2.6 mV
Lead Channel Sensing Intrinsic Amplitude: 2.6 mV
Lead Channel Setting Pacing Amplitude: 1.25 V
Lead Channel Setting Pacing Amplitude: 1.75 V
Lead Channel Setting Pacing Pulse Width: 0.5 ms
Lead Channel Setting Sensing Sensitivity: 2 mV
Pulse Gen Model: 2272
Pulse Gen Serial Number: 3975489

## 2024-01-22 MED ORDER — APIXABAN 5 MG PO TABS: 5.0000 mg | ORAL_TABLET | Freq: Two times a day (BID) | ORAL | 3 refills | Status: AC

## 2024-01-22 NOTE — Progress Notes (Signed)
  Electrophysiology Office Note:    Date:  01/22/2024   ID:  [name] [name], DOB [birth year], MRN [id number]  PCP:  Shelda Atlas, MD   [facility] Providers Cardiologist:  Gordy Bergamo, MD     Referring MD: Shelda Atlas, MD   History of Present Illness:    Zacharie Portner is a 88 y.o. male with a Saint Jude dual-chamber pacemaker and a medical history significant for atrial flutter, sick sinus syndrome, referred for device and rhythm management.        Discussed the use of AI scribe software for clinical note transcription with the patient, who gave verbal consent to proceed.  History of Present Illness [name] [name] is an 88 year old male with atrial fibrillation and prostate cancer who presents for management of his pacemaker and atrial flutter.  He has a dual chamber pacemaker, initially placed in 2013, with a generator change in 2022. The pacemaker is managed by his electrophysiologist. He has undergone an ablation for atrial flutter but experienced a recurrence of atrial flutter.           Today, he reports that he feels well.  EKGs/Labs/Other Studies Reviewed Today:     Echocardiogram:  TTE February 2020 LVEF greater than 65%.  Normal diastolic function.  Normal biatrial size.    EKG:   EKG Interpretation Date/Time:  Friday January 22 2024 12:12:07 EDT Ventricular Rate:  61 PR Interval:  220 QRS Duration:  90 QT Interval:  424 QTC Calculation: 426 R Axis:   79  Text Interpretation: Atrial-paced rhythm with prolonged AV conduction When compared with ECG of 05-Jan-2024 11:25, Questionable change in QRS axis Confirmed by Nancey Scotts [phone]) on 01/22/2024 12:19:26 PM     Physical Exam:    VS:  BP (!) 169/79 (BP Location: Left Arm, Patient Position: Sitting, Cuff Size: Normal)   Pulse 61   Ht 5' 6 (1.676 m)   Wt 164 lb (74.4 kg)   SpO2 100%   BMI 26.47 kg/m     Wt Readings from Last 3 Encounters:  01/22/24 164 lb (74.4 kg)  01/05/24 165  lb 12.8 oz (75.2 kg)  11/12/22 167 lb 9.6 oz (76 kg)     GEN: Well nourished, well developed in no acute distress CARDIAC: RRR, no murmurs, rubs, gallops The device site is normal -- no tenderness, edema, drainage, redness, threatened erosion.  RESPIRATORY:  Normal work of breathing MUSCULOSKELETAL: no edema    ASSESSMENT & PLAN:     Sick sinus syndrome Medtronic dual-chamber pacemaker in place, functioning normally I reviewed today's interrogation see Paceart for details He is not pacemaker dependent today  History of atrial fibrillation, atrial flutter Status post ablation   Secondary hypercoagulable state On Eliquis  2.5 mg p.o. twice daily  renal function was significant worse in the past, but creatinine is less than 1.5 today and his weight 75 kg Will increase Eliquis  to 5mg  PO daily  Hypertension On amlodipine  10 mg, hydralazine  50 mg 3 times daily,  metoprolol  50 mg twice daily, olmesartan /hydrochlorothiazide   Managed by Dr. Pearletha      Signed, Scotts FORBES Nancey, MD  01/22/2024 12:33 PM    [facility]

## 2024-01-22 NOTE — Patient Instructions (Addendum)
 Medication Instructions:  Your physician has recommended you make the following change in your medication:  INCREASE Eliquis  to 5 mg twice daily  *If you need a refill on your cardiac medications before your next appointment, please call your pharmacy*  Lab Work: None ordered  If you have any lab test that is abnormal or we need to change your treatment, we will call you to review the results  Testing/Procedures: None ordered  Follow-Up: At [facility], you and your health needs are our priority.  As part of our continuing mission to provide you with exceptional heart care, our providers are all part of one team.  This team includes your primary Cardiologist (physician) and Advanced Practice Providers or APPs (Physician Assistants and Nurse Practitioners) who all work together to provide you with the care you need, when you need it.  Remote monitoring is used to monitor your Pacemaker or ICD from home. This monitoring reduces the number of office visits required to check your device to one time per year. It allows us  to keep an eye on the functioning of your device to ensure it is working properly. You are scheduled for a device check from home on 03/14/24. You may send your transmission at any time that day. If you have a wireless device, the transmission will be sent automatically. After your physician reviews your transmission, you will receive a postcard with your next transmission date.   Your next appointment:   1 year(s)  Provider:   You will see one of the following Advanced Practice Providers on your designated Care Team:   Charlies Arthur, PA-C Michael Andy Tillery, PA-C Brandi Ollis, NP Artist Pouch, PA-C    Thank you for choosing [facility]!!   [phone]

## 2024-01-23 ENCOUNTER — Ambulatory Visit: Payer: Self-pay | Admitting: Cardiovascular Disease

## 2024-01-29 NOTE — Progress Notes (Signed)
 Remote PPM Transmission

## 2024-02-02 ENCOUNTER — Telehealth: Payer: Self-pay | Admitting: Cardiovascular Disease

## 2024-02-02 NOTE — Telephone Encounter (Signed)
 Pt called in and stated he went to his pcp yesterday and he had blood in his urine.  His PCP told him to call [facility] to let us  know.  It could be due to increase in the blood thinner. Pt denies any other symptoms   Best number [phone]

## 2024-02-02 NOTE — Telephone Encounter (Signed)
 If hematuria persists, hold eliquis  for 2 days and then restart, he has history of prostate cancer and should follow up with urology to see if anything has changed

## 2024-02-02 NOTE — Telephone Encounter (Signed)
 Called and spoke to patient   Patient states he has been having blood in his urine for 3 days. He states urin stream was red, He saw Primary yesterday -   informed him to contact office. Also  to decrease Elqiuis to one tablet daily   The patient states he followed primary instruction and only take one tablet yesterday and today.  He states his urine is pink in color now.   RN informed patient will send information to his cardiologist and as  well as the Doctor of the day ,pharmacy team

## 2024-02-02 NOTE — Telephone Encounter (Signed)
 He is on the correct dose of Eliquis  now. CHADSVASc of 3.  Recommend he hold dose tonight and see if symptoms resolve

## 2024-02-02 NOTE — Telephone Encounter (Signed)
 Patient  called.  He is aware to hold Eliquis  tonight and see how it resolve.  Will need a call  tomorrow to see how his symptoms are and let Pharmacist know.

## 2024-02-03 NOTE — Telephone Encounter (Signed)
 Spoke with Pt. Gave recommendations. Pt stated understanding.

## 2024-02-05 ENCOUNTER — Other Ambulatory Visit: Payer: Self-pay | Admitting: Cardiology

## 2024-02-07 ENCOUNTER — Other Ambulatory Visit: Payer: Self-pay | Admitting: Cardiology

## 2024-02-09 NOTE — Telephone Encounter (Signed)
 Pt's pharmacy is requesting a refill on non cardiac medication tamsulosin . Would Dr. Ganji like to refill this medication? Please address

## 2024-02-22 ENCOUNTER — Other Ambulatory Visit: Payer: Self-pay | Admitting: Cardiology

## 2024-02-22 DIAGNOSIS — I10 Essential (primary) hypertension: Secondary | ICD-10-CM

## 2024-03-14 ENCOUNTER — Ambulatory Visit (INDEPENDENT_AMBULATORY_CARE_PROVIDER_SITE_OTHER): Payer: Medicare HMO

## 2024-03-14 DIAGNOSIS — I4892 Unspecified atrial flutter: Secondary | ICD-10-CM | POA: Diagnosis not present

## 2024-03-14 LAB — CUP PACEART REMOTE DEVICE CHECK
Battery Remaining Longevity: 84 mo
Battery Remaining Percentage: 73 %
Battery Voltage: 3.01 V
Brady Statistic AP VP Percent: 2.9 %
Brady Statistic AP VS Percent: 91 %
Brady Statistic AS VP Percent: 3.2 %
Brady Statistic AS VS Percent: 3.2 %
Brady Statistic RA Percent Paced: 91 %
Brady Statistic RV Percent Paced: 6.1 %
Date Time Interrogation Session: 20251110023555
Implantable Lead Connection Status: 753985
Implantable Lead Connection Status: 753985
Implantable Lead Implant Date: 20130726
Implantable Lead Implant Date: 20130726
Implantable Lead Location: 753859
Implantable Lead Location: 753860
Implantable Lead Model: 1948
Implantable Pulse Generator Implant Date: 20221114
Lead Channel Impedance Value: 310 Ohm
Lead Channel Impedance Value: 490 Ohm
Lead Channel Pacing Threshold Amplitude: 0.625 V
Lead Channel Pacing Threshold Amplitude: 0.875 V
Lead Channel Pacing Threshold Pulse Width: 0.5 ms
Lead Channel Pacing Threshold Pulse Width: 0.5 ms
Lead Channel Sensing Intrinsic Amplitude: 3.2 mV
Lead Channel Sensing Intrinsic Amplitude: 7.4 mV
Lead Channel Setting Pacing Amplitude: 1.125
Lead Channel Setting Pacing Amplitude: 1.625
Lead Channel Setting Pacing Pulse Width: 0.5 ms
Lead Channel Setting Sensing Sensitivity: 2 mV
Pulse Gen Model: 2272
Pulse Gen Serial Number: 3975489

## 2024-03-16 ENCOUNTER — Ambulatory Visit: Payer: Self-pay | Admitting: Cardiovascular Disease

## 2024-03-17 NOTE — Progress Notes (Signed)
 Remote PPM Transmission

## 2024-05-15 ENCOUNTER — Other Ambulatory Visit: Payer: Self-pay | Admitting: Cardiology

## 2024-06-13 ENCOUNTER — Encounter

## 2024-09-12 ENCOUNTER — Encounter

## 2024-12-12 ENCOUNTER — Encounter
